# Patient Record
Sex: Male | Born: 1950 | Race: White | Hispanic: No | Marital: Married | Smoking: Former smoker
Health system: Southern US, Community
[De-identification: ages and names within clinical notes are randomized; demographics above are authoritative.]

## PROBLEM LIST (undated history)

## (undated) DIAGNOSIS — I739 Peripheral vascular disease, unspecified: Secondary | ICD-10-CM

## (undated) DIAGNOSIS — N289 Disorder of kidney and ureter, unspecified: Secondary | ICD-10-CM

## (undated) DIAGNOSIS — I429 Cardiomyopathy, unspecified: Secondary | ICD-10-CM

## (undated) DIAGNOSIS — G4730 Sleep apnea, unspecified: Secondary | ICD-10-CM

## (undated) DIAGNOSIS — R002 Palpitations: Secondary | ICD-10-CM

## (undated) DIAGNOSIS — M129 Arthropathy, unspecified: Secondary | ICD-10-CM

## (undated) DIAGNOSIS — E559 Vitamin D deficiency, unspecified: Secondary | ICD-10-CM

## (undated) DIAGNOSIS — R942 Abnormal results of pulmonary function studies: Secondary | ICD-10-CM

## (undated) DIAGNOSIS — E049 Nontoxic goiter, unspecified: Secondary | ICD-10-CM

## (undated) DIAGNOSIS — F32A Depression, unspecified: Secondary | ICD-10-CM

## (undated) DIAGNOSIS — M5136 Other intervertebral disc degeneration, lumbar region: Secondary | ICD-10-CM

## (undated) DIAGNOSIS — K219 Gastro-esophageal reflux disease without esophagitis: Secondary | ICD-10-CM

## (undated) DIAGNOSIS — G4733 Obstructive sleep apnea (adult) (pediatric): Secondary | ICD-10-CM

## (undated) DIAGNOSIS — F329 Major depressive disorder, single episode, unspecified: Secondary | ICD-10-CM

## (undated) DIAGNOSIS — IMO0001 Reserved for inherently not codable concepts without codable children: Secondary | ICD-10-CM

## (undated) DIAGNOSIS — J449 Chronic obstructive pulmonary disease, unspecified: Secondary | ICD-10-CM

## (undated) DIAGNOSIS — K5792 Diverticulitis of intestine, part unspecified, without perforation or abscess without bleeding: Secondary | ICD-10-CM

## (undated) DIAGNOSIS — Z973 Presence of spectacles and contact lenses: Secondary | ICD-10-CM

## (undated) DIAGNOSIS — E785 Hyperlipidemia, unspecified: Secondary | ICD-10-CM

## (undated) DIAGNOSIS — J189 Pneumonia, unspecified organism: Secondary | ICD-10-CM

## (undated) DIAGNOSIS — R9439 Abnormal result of other cardiovascular function study: Secondary | ICD-10-CM

## (undated) DIAGNOSIS — R519 Headache, unspecified: Secondary | ICD-10-CM

## (undated) DIAGNOSIS — Z9289 Personal history of other medical treatment: Secondary | ICD-10-CM

## (undated) DIAGNOSIS — Z8701 Personal history of pneumonia (recurrent): Secondary | ICD-10-CM

## (undated) DIAGNOSIS — Z87442 Personal history of urinary calculi: Secondary | ICD-10-CM

## (undated) DIAGNOSIS — Z8619 Personal history of other infectious and parasitic diseases: Secondary | ICD-10-CM

## (undated) DIAGNOSIS — R000 Tachycardia, unspecified: Secondary | ICD-10-CM

## (undated) DIAGNOSIS — M109 Gout, unspecified: Secondary | ICD-10-CM

## (undated) DIAGNOSIS — M8580 Other specified disorders of bone density and structure, unspecified site: Secondary | ICD-10-CM

## (undated) DIAGNOSIS — R51 Headache: Secondary | ICD-10-CM

## (undated) DIAGNOSIS — E079 Disorder of thyroid, unspecified: Secondary | ICD-10-CM

## (undated) DIAGNOSIS — R7303 Prediabetes: Secondary | ICD-10-CM

## (undated) DIAGNOSIS — M47816 Spondylosis without myelopathy or radiculopathy, lumbar region: Secondary | ICD-10-CM

## (undated) DIAGNOSIS — I10 Essential (primary) hypertension: Secondary | ICD-10-CM

## (undated) DIAGNOSIS — G4710 Hypersomnia, unspecified: Secondary | ICD-10-CM

## (undated) DIAGNOSIS — I6529 Occlusion and stenosis of unspecified carotid artery: Secondary | ICD-10-CM

## (undated) DIAGNOSIS — F419 Anxiety disorder, unspecified: Secondary | ICD-10-CM

## (undated) DIAGNOSIS — E039 Hypothyroidism, unspecified: Secondary | ICD-10-CM

## (undated) DIAGNOSIS — M51369 Other intervertebral disc degeneration, lumbar region without mention of lumbar back pain or lower extremity pain: Secondary | ICD-10-CM

## (undated) DIAGNOSIS — R918 Other nonspecific abnormal finding of lung field: Secondary | ICD-10-CM

## (undated) DIAGNOSIS — K439 Ventral hernia without obstruction or gangrene: Secondary | ICD-10-CM

## (undated) DIAGNOSIS — E46 Unspecified protein-calorie malnutrition: Secondary | ICD-10-CM

## (undated) DIAGNOSIS — N200 Calculus of kidney: Secondary | ICD-10-CM

## (undated) DIAGNOSIS — M1990 Unspecified osteoarthritis, unspecified site: Secondary | ICD-10-CM

## (undated) HISTORY — DX: Abnormal results of pulmonary function studies: R94.2

## (undated) HISTORY — PX: JOINT REPLACEMENT: SHX530

## (undated) HISTORY — DX: Vitamin D deficiency, unspecified: E55.9

## (undated) HISTORY — DX: Other specified disorders of bone density and structure, unspecified site: M85.80

## (undated) HISTORY — PX: COLONOSCOPY: SHX174

## (undated) HISTORY — DX: Ventral hernia without obstruction or gangrene: K43.9

## (undated) HISTORY — DX: Hyperlipidemia, unspecified: E78.5

## (undated) HISTORY — DX: Spondylosis without myelopathy or radiculopathy, lumbar region: M47.816

## (undated) HISTORY — DX: Nontoxic goiter, unspecified: E04.9

## (undated) HISTORY — DX: Calculus of kidney: N20.0

## (undated) HISTORY — DX: Obstructive sleep apnea (adult) (pediatric): G47.33

## (undated) HISTORY — DX: Other nonspecific abnormal finding of lung field: R91.8

## (undated) HISTORY — DX: Diverticulitis of intestine, part unspecified, without perforation or abscess without bleeding: K57.92

## (undated) HISTORY — DX: Prediabetes: R73.03

## (undated) HISTORY — DX: Hypersomnia, unspecified: G47.10

## (undated) HISTORY — DX: Other intervertebral disc degeneration, lumbar region: M51.36

## (undated) HISTORY — PX: FINGER AMPUTATION: SHX636

## (undated) HISTORY — DX: Gastro-esophageal reflux disease without esophagitis: K21.9

## (undated) HISTORY — DX: Occlusion and stenosis of unspecified carotid artery: I65.29

## (undated) HISTORY — DX: Personal history of other medical treatment: Z92.89

## (undated) HISTORY — DX: Arthropathy, unspecified: M12.9

## (undated) HISTORY — DX: Gout, unspecified: M10.9

## (undated) HISTORY — DX: Abnormal result of other cardiovascular function study: R94.39

## (undated) HISTORY — DX: Peripheral vascular disease, unspecified: I73.9

## (undated) HISTORY — DX: Cardiomyopathy, unspecified: I42.9

## (undated) HISTORY — DX: Chronic obstructive pulmonary disease, unspecified: J44.9

## (undated) HISTORY — DX: Palpitations: R00.2

## (undated) HISTORY — DX: Tachycardia, unspecified: R00.0

## (undated) HISTORY — DX: Other intervertebral disc degeneration, lumbar region without mention of lumbar back pain or lower extremity pain: M51.369

---

## 2014-10-18 DIAGNOSIS — Z8619 Personal history of other infectious and parasitic diseases: Secondary | ICD-10-CM

## 2014-10-18 HISTORY — DX: Personal history of other infectious and parasitic diseases: Z86.19

## 2014-10-27 ENCOUNTER — Encounter (HOSPITAL_BASED_OUTPATIENT_CLINIC_OR_DEPARTMENT_OTHER): Payer: Self-pay | Admitting: *Deleted

## 2014-10-27 ENCOUNTER — Inpatient Hospital Stay (HOSPITAL_BASED_OUTPATIENT_CLINIC_OR_DEPARTMENT_OTHER)
Admission: EM | Admit: 2014-10-27 | Discharge: 2014-11-21 | DRG: 003 | Disposition: A | Discharge status: Rehabilitation Facility | Payer: 59 | Attending: General Surgery | Admitting: General Surgery

## 2014-10-27 ENCOUNTER — Emergency Department (HOSPITAL_BASED_OUTPATIENT_CLINIC_OR_DEPARTMENT_OTHER): Payer: 59

## 2014-10-27 DIAGNOSIS — E871 Hypo-osmolality and hyponatremia: Secondary | ICD-10-CM | POA: Diagnosis present

## 2014-10-27 DIAGNOSIS — E46 Unspecified protein-calorie malnutrition: Secondary | ICD-10-CM | POA: Diagnosis present

## 2014-10-27 DIAGNOSIS — Z933 Colostomy status: Secondary | ICD-10-CM

## 2014-10-27 DIAGNOSIS — E039 Hypothyroidism, unspecified: Secondary | ICD-10-CM | POA: Diagnosis present

## 2014-10-27 DIAGNOSIS — E870 Hyperosmolality and hypernatremia: Secondary | ICD-10-CM | POA: Diagnosis present

## 2014-10-27 DIAGNOSIS — G4733 Obstructive sleep apnea (adult) (pediatric): Secondary | ICD-10-CM | POA: Diagnosis present

## 2014-10-27 DIAGNOSIS — E878 Other disorders of electrolyte and fluid balance, not elsewhere classified: Secondary | ICD-10-CM | POA: Diagnosis present

## 2014-10-27 DIAGNOSIS — K651 Peritoneal abscess: Secondary | ICD-10-CM | POA: Diagnosis present

## 2014-10-27 DIAGNOSIS — I9581 Postprocedural hypotension: Secondary | ICD-10-CM | POA: Diagnosis not present

## 2014-10-27 DIAGNOSIS — I214 Non-ST elevation (NSTEMI) myocardial infarction: Secondary | ICD-10-CM | POA: Diagnosis not present

## 2014-10-27 DIAGNOSIS — Z978 Presence of other specified devices: Secondary | ICD-10-CM

## 2014-10-27 DIAGNOSIS — Z9911 Dependence on respirator [ventilator] status: Secondary | ICD-10-CM

## 2014-10-27 DIAGNOSIS — K429 Umbilical hernia without obstruction or gangrene: Secondary | ICD-10-CM | POA: Diagnosis present

## 2014-10-27 DIAGNOSIS — E43 Unspecified severe protein-calorie malnutrition: Secondary | ICD-10-CM | POA: Diagnosis present

## 2014-10-27 DIAGNOSIS — Y95 Nosocomial condition: Secondary | ICD-10-CM | POA: Diagnosis present

## 2014-10-27 DIAGNOSIS — I10 Essential (primary) hypertension: Secondary | ICD-10-CM | POA: Diagnosis present

## 2014-10-27 DIAGNOSIS — J9621 Acute and chronic respiratory failure with hypoxia: Secondary | ICD-10-CM | POA: Diagnosis not present

## 2014-10-27 DIAGNOSIS — A419 Sepsis, unspecified organism: Secondary | ICD-10-CM | POA: Diagnosis present

## 2014-10-27 DIAGNOSIS — R451 Restlessness and agitation: Secondary | ICD-10-CM | POA: Diagnosis not present

## 2014-10-27 DIAGNOSIS — J9601 Acute respiratory failure with hypoxia: Secondary | ICD-10-CM | POA: Diagnosis present

## 2014-10-27 DIAGNOSIS — N200 Calculus of kidney: Secondary | ICD-10-CM | POA: Diagnosis present

## 2014-10-27 DIAGNOSIS — J320 Chronic maxillary sinusitis: Secondary | ICD-10-CM | POA: Diagnosis present

## 2014-10-27 DIAGNOSIS — K631 Perforation of intestine (nontraumatic): Secondary | ICD-10-CM

## 2014-10-27 DIAGNOSIS — R109 Unspecified abdominal pain: Secondary | ICD-10-CM | POA: Diagnosis present

## 2014-10-27 DIAGNOSIS — E860 Dehydration: Secondary | ICD-10-CM | POA: Diagnosis present

## 2014-10-27 DIAGNOSIS — L539 Erythematous condition, unspecified: Secondary | ICD-10-CM | POA: Diagnosis present

## 2014-10-27 DIAGNOSIS — J811 Chronic pulmonary edema: Secondary | ICD-10-CM | POA: Diagnosis present

## 2014-10-27 DIAGNOSIS — J9811 Atelectasis: Secondary | ICD-10-CM | POA: Diagnosis present

## 2014-10-27 DIAGNOSIS — E876 Hypokalemia: Secondary | ICD-10-CM | POA: Diagnosis not present

## 2014-10-27 DIAGNOSIS — E669 Obesity, unspecified: Secondary | ICD-10-CM | POA: Diagnosis present

## 2014-10-27 DIAGNOSIS — E079 Disorder of thyroid, unspecified: Secondary | ICD-10-CM | POA: Diagnosis present

## 2014-10-27 DIAGNOSIS — Z888 Allergy status to other drugs, medicaments and biological substances status: Secondary | ICD-10-CM

## 2014-10-27 DIAGNOSIS — J9600 Acute respiratory failure, unspecified whether with hypoxia or hypercapnia: Secondary | ICD-10-CM

## 2014-10-27 DIAGNOSIS — Z87891 Personal history of nicotine dependence: Secondary | ICD-10-CM

## 2014-10-27 DIAGNOSIS — E875 Hyperkalemia: Secondary | ICD-10-CM | POA: Diagnosis present

## 2014-10-27 DIAGNOSIS — J189 Pneumonia, unspecified organism: Secondary | ICD-10-CM | POA: Diagnosis present

## 2014-10-27 DIAGNOSIS — K567 Ileus, unspecified: Secondary | ICD-10-CM | POA: Diagnosis not present

## 2014-10-27 DIAGNOSIS — E872 Acidosis: Secondary | ICD-10-CM | POA: Diagnosis present

## 2014-10-27 DIAGNOSIS — T50905A Adverse effect of unspecified drugs, medicaments and biological substances, initial encounter: Secondary | ICD-10-CM | POA: Diagnosis not present

## 2014-10-27 DIAGNOSIS — K5720 Diverticulitis of large intestine with perforation and abscess without bleeding: Principal | ICD-10-CM | POA: Diagnosis present

## 2014-10-27 DIAGNOSIS — A498 Other bacterial infections of unspecified site: Secondary | ICD-10-CM | POA: Diagnosis not present

## 2014-10-27 DIAGNOSIS — Z430 Encounter for attention to tracheostomy: Secondary | ICD-10-CM

## 2014-10-27 DIAGNOSIS — R911 Solitary pulmonary nodule: Secondary | ICD-10-CM | POA: Diagnosis present

## 2014-10-27 DIAGNOSIS — B372 Candidiasis of skin and nail: Secondary | ICD-10-CM | POA: Diagnosis not present

## 2014-10-27 DIAGNOSIS — G8918 Other acute postprocedural pain: Secondary | ICD-10-CM | POA: Diagnosis present

## 2014-10-27 DIAGNOSIS — D649 Anemia, unspecified: Secondary | ICD-10-CM | POA: Diagnosis not present

## 2014-10-27 DIAGNOSIS — Z79899 Other long term (current) drug therapy: Secondary | ICD-10-CM | POA: Diagnosis not present

## 2014-10-27 DIAGNOSIS — R4182 Altered mental status, unspecified: Secondary | ICD-10-CM

## 2014-10-27 DIAGNOSIS — Z930 Tracheostomy status: Secondary | ICD-10-CM

## 2014-10-27 DIAGNOSIS — G7281 Critical illness myopathy: Secondary | ICD-10-CM | POA: Diagnosis present

## 2014-10-27 DIAGNOSIS — E8770 Fluid overload, unspecified: Secondary | ICD-10-CM | POA: Diagnosis present

## 2014-10-27 DIAGNOSIS — R739 Hyperglycemia, unspecified: Secondary | ICD-10-CM | POA: Diagnosis present

## 2014-10-27 DIAGNOSIS — L270 Generalized skin eruption due to drugs and medicaments taken internally: Secondary | ICD-10-CM | POA: Diagnosis not present

## 2014-10-27 DIAGNOSIS — Z87442 Personal history of urinary calculi: Secondary | ICD-10-CM | POA: Diagnosis not present

## 2014-10-27 DIAGNOSIS — Z6838 Body mass index (BMI) 38.0-38.9, adult: Secondary | ICD-10-CM | POA: Diagnosis not present

## 2014-10-27 DIAGNOSIS — R6521 Severe sepsis with septic shock: Secondary | ICD-10-CM | POA: Diagnosis present

## 2014-10-27 DIAGNOSIS — J9690 Respiratory failure, unspecified, unspecified whether with hypoxia or hypercapnia: Secondary | ICD-10-CM

## 2014-10-27 DIAGNOSIS — R509 Fever, unspecified: Secondary | ICD-10-CM | POA: Diagnosis present

## 2014-10-27 DIAGNOSIS — R21 Rash and other nonspecific skin eruption: Secondary | ICD-10-CM | POA: Diagnosis not present

## 2014-10-27 DIAGNOSIS — T884XXA Failed or difficult intubation, initial encounter: Secondary | ICD-10-CM

## 2014-10-27 DIAGNOSIS — B966 Bacteroides fragilis [B. fragilis] as the cause of diseases classified elsewhere: Secondary | ICD-10-CM | POA: Diagnosis not present

## 2014-10-27 DIAGNOSIS — R579 Shock, unspecified: Secondary | ICD-10-CM | POA: Diagnosis present

## 2014-10-27 DIAGNOSIS — J9620 Acute and chronic respiratory failure, unspecified whether with hypoxia or hypercapnia: Secondary | ICD-10-CM | POA: Diagnosis not present

## 2014-10-27 HISTORY — DX: Disorder of kidney and ureter, unspecified: N28.9

## 2014-10-27 HISTORY — DX: Disorder of thyroid, unspecified: E07.9

## 2014-10-27 HISTORY — DX: Essential (primary) hypertension: I10

## 2014-10-27 HISTORY — DX: Hypothyroidism, unspecified: E03.9

## 2014-10-27 HISTORY — DX: Unspecified protein-calorie malnutrition: E46

## 2014-10-27 LAB — URINALYSIS, ROUTINE W REFLEX MICROSCOPIC
BILIRUBIN URINE: NEGATIVE
Glucose, UA: NEGATIVE mg/dL
Hgb urine dipstick: NEGATIVE
KETONES UR: NEGATIVE mg/dL
Leukocytes, UA: NEGATIVE
NITRITE: NEGATIVE
Protein, ur: NEGATIVE mg/dL
Specific Gravity, Urine: 1.044 — ABNORMAL HIGH (ref 1.005–1.030)
Urobilinogen, UA: 0.2 mg/dL (ref 0.0–1.0)
pH: 5.5 (ref 5.0–8.0)

## 2014-10-27 LAB — COMPREHENSIVE METABOLIC PANEL
ALK PHOS: 53 U/L (ref 39–117)
ALT: 24 U/L (ref 0–53)
AST: 29 U/L (ref 0–37)
Albumin: 4.6 g/dL (ref 3.5–5.2)
Anion gap: 11 (ref 5–15)
BUN: 23 mg/dL (ref 6–23)
CHLORIDE: 99 meq/L (ref 96–112)
CO2: 24 mmol/L (ref 19–32)
Calcium: 9.4 mg/dL (ref 8.4–10.5)
Creatinine, Ser: 1.17 mg/dL (ref 0.50–1.35)
GFR, EST AFRICAN AMERICAN: 76 mL/min — AB (ref >90)
GFR, EST NON AFRICAN AMERICAN: 66 mL/min — AB (ref >90)
GLUCOSE: 170 mg/dL — AB (ref 70–99)
POTASSIUM: 4 mmol/L (ref 3.5–5.1)
SODIUM: 134 mmol/L — AB (ref 135–145)
TOTAL PROTEIN: 6.8 g/dL (ref 6.0–8.3)
Total Bilirubin: 0.7 mg/dL (ref 0.3–1.2)

## 2014-10-27 LAB — CBC WITH DIFFERENTIAL/PLATELET
BASOS ABS: 0 10*3/uL (ref 0.0–0.1)
Basophils Relative: 0 % (ref 0–1)
EOS ABS: 0 10*3/uL (ref 0.0–0.7)
Eosinophils Relative: 0 % (ref 0–5)
HCT: 50.9 % (ref 39.0–52.0)
HEMOGLOBIN: 16.9 g/dL (ref 13.0–17.0)
LYMPHS ABS: 0.6 10*3/uL — AB (ref 0.7–4.0)
Lymphocytes Relative: 4 % — ABNORMAL LOW (ref 12–46)
MCH: 32.1 pg (ref 26.0–34.0)
MCHC: 33.2 g/dL (ref 30.0–36.0)
MCV: 96.8 fL (ref 78.0–100.0)
MONO ABS: 1.1 10*3/uL — AB (ref 0.1–1.0)
MONOS PCT: 7 % (ref 3–12)
NEUTROS ABS: 13.8 10*3/uL — AB (ref 1.7–7.7)
NEUTROS PCT: 89 % — AB (ref 43–77)
Platelets: 187 10*3/uL (ref 150–400)
RBC: 5.26 MIL/uL (ref 4.22–5.81)
RDW: 14.5 % (ref 11.5–15.5)
WBC: 15.5 10*3/uL — ABNORMAL HIGH (ref 4.0–10.5)

## 2014-10-27 LAB — I-STAT CG4 LACTIC ACID, ED
LACTIC ACID, VENOUS: 3.21 mmol/L — AB (ref 0.5–2.2)
LACTIC ACID, VENOUS: 4.76 mmol/L — AB (ref 0.5–2.2)

## 2014-10-27 LAB — LIPASE, BLOOD: Lipase: 23 U/L (ref 11–59)

## 2014-10-27 MED ORDER — SODIUM CHLORIDE 0.9 % IV BOLUS (SEPSIS)
1000.0000 mL | Freq: Once | INTRAVENOUS | Status: AC
  Administered 2014-10-27: 1000 mL via INTRAVENOUS

## 2014-10-27 MED ORDER — ONDANSETRON HCL 4 MG/2ML IJ SOLN
INTRAMUSCULAR | Status: AC
  Filled 2014-10-27: qty 2

## 2014-10-27 MED ORDER — PROPOFOL 10 MG/ML IV BOLUS
INTRAVENOUS | Status: AC
  Filled 2014-10-27: qty 20

## 2014-10-27 MED ORDER — LEVOTHYROXINE SODIUM 100 MCG IV SOLR
50.0000 ug | Freq: Every day | INTRAVENOUS | Status: DC
  Administered 2014-10-28: 50 ug via INTRAVENOUS
  Filled 2014-10-27 (×2): qty 5

## 2014-10-27 MED ORDER — PIPERACILLIN-TAZOBACTAM 3.375 G IVPB 30 MIN
3.3750 g | Freq: Once | INTRAVENOUS | Status: AC
  Administered 2014-10-27: 3.375 g via INTRAVENOUS
  Filled 2014-10-27 (×2): qty 50

## 2014-10-27 MED ORDER — IOHEXOL 300 MG/ML  SOLN
100.0000 mL | Freq: Once | INTRAMUSCULAR | Status: AC | PRN
  Administered 2014-10-27: 100 mL via INTRAVENOUS

## 2014-10-27 MED ORDER — IOHEXOL 300 MG/ML  SOLN
50.0000 mL | Freq: Once | INTRAMUSCULAR | Status: AC | PRN
  Administered 2014-10-27: 50 mL via ORAL

## 2014-10-27 MED ORDER — HYDROMORPHONE HCL 1 MG/ML IJ SOLN
1.0000 mg | Freq: Once | INTRAMUSCULAR | Status: AC
  Administered 2014-10-27: 1 mg via INTRAVENOUS
  Filled 2014-10-27: qty 1

## 2014-10-27 MED ORDER — ONDANSETRON HCL 4 MG/2ML IJ SOLN
4.0000 mg | Freq: Once | INTRAMUSCULAR | Status: AC
  Administered 2014-10-27: 4 mg via INTRAVENOUS
  Filled 2014-10-27: qty 2

## 2014-10-27 MED ORDER — SODIUM CHLORIDE 0.9 % IV SOLN
Freq: Once | INTRAVENOUS | Status: AC
  Administered 2014-10-27: 20:00:00 via INTRAVENOUS

## 2014-10-27 MED ORDER — LIDOCAINE HCL (CARDIAC) 20 MG/ML IV SOLN
INTRAVENOUS | Status: AC
  Filled 2014-10-27: qty 5

## 2014-10-27 MED ORDER — CISATRACURIUM BESYLATE 20 MG/10ML IV SOLN
INTRAVENOUS | Status: AC
  Filled 2014-10-27: qty 10

## 2014-10-27 NOTE — ED Notes (Signed)
abd pain started earlier today, [facility] of [facility]d

## 2014-10-27 NOTE — ED Provider Notes (Signed)
CSN: [id number]     Arrival date & time 10/27/14  1603 History  This chart was scribed for William RollerBrian D Hudsen Fei, MD by Evon Slackerrance Branch, ED Scribe. This patient was seen in room MH02/MH02 and the patient's care was started at 6:08 PM.      Chief Complaint  Patient presents with  . Abdominal Pain   Patient is a 64 y.o. male presenting with abdominal pain. The history is provided by the patient. No language interpreter was used.  Abdominal Pain  HPI Comments: William OuJoseph Mesa is a 64 y.o. male with PMHx  who presents to the Emergency Department complaining of sudden constant left sided abdominal pain that had been persistent throughout the day. Pt states that he feels as the pain is worse with movement or coughing. Pt states that he has umbilical hernia present. Denies abdomen surgeries. Pt states he has had colonoscopy several years ago that showed now abnormalities. Denies new back pain.    -The symptoms are severe, persistent, worse with coughing, not associated with fevers.  Past Medical History  Diagnosis Date  . Renal disorder     kidney stones  . Hypertension   . Thyroid disease    History reviewed. No pertinent past surgical history. No family history on file. History  Substance Use Topics  . Smoking status: Former Games developermoker  . Smokeless tobacco: Not on file  . Alcohol Use: Not on file    Review of Systems  Gastrointestinal: Positive for abdominal pain.  All other systems reviewed and are negative.     Allergies  Aloprim and Simvastatin  Home Medications   Prior to Admission medications   Medication Sig Start Date End Date Taking? Authorizing Provider  levothyroxine (SYNTHROID, LEVOTHROID) 100 MCG tablet Take 100 mcg by mouth daily before breakfast.   Yes Historical Provider, MD  lisinopril (PRINIVIL,ZESTRIL) 10 MG tablet Take 10 mg by mouth daily.   Yes Historical Provider, MD  meloxicam (MOBIC) 15 MG tablet Take 15 mg by mouth daily.   Yes Historical Provider, MD  Vitamin  D, Ergocalciferol, (DRISDOL) 50000 UNITS CAPS capsule Take 50,000 Units by mouth every 7 (seven) days.   Yes Historical Provider, MD   Triage Vitals: BP 105/52 mmHg  Pulse 103  Temp(Src) 99.7 F (37.6 C) (Oral)  Resp 20  SpO2 95%  Physical Exam  Constitutional: He appears well-developed and well-nourished. No distress.  HENT:  Head: Normocephalic and atraumatic.  Mouth/Throat: Oropharynx is clear and moist. No oropharyngeal exudate.  Eyes: Conjunctivae and EOM are normal. Pupils are equal, round, and reactive to light. Right eye exhibits no discharge. Left eye exhibits no discharge. No scleral icterus.  Neck: Normal range of motion. Neck supple. No JVD present. No thyromegaly present.  Cardiovascular: Normal rate, regular rhythm, normal heart sounds and intact distal pulses.  Exam reveals no gallop and no friction rub.   No murmur heard. Pulmonary/Chest: Effort normal and breath sounds normal. No respiratory distress. He has no wheezes. He has no rales.  Abdominal: Soft. Bowel sounds are normal. He exhibits no distension and no mass. There is tenderness.  Diffuse abdominal tenderness with peritoneal signs and cough pain.   Musculoskeletal: Normal range of motion. He exhibits no edema or tenderness.  Lymphadenopathy:    He has no cervical adenopathy.  Neurological: He is alert. Coordination normal.  Skin: Skin is warm and dry. No rash noted. No erythema.  Psychiatric: He has a normal mood and affect. His behavior is normal.  Nursing note and vitals  reviewed.   ED Course  Procedures (including critical care time) DIAGNOSTIC STUDIES: Oxygen Saturation is 95% on RA, adequate by my interpretation.    COORDINATION OF CARE: 6:15 PM-Discussed treatment plan with pt at bedside and pt agreed to plan.     Labs Review Labs Reviewed  CBC WITH DIFFERENTIAL - Abnormal; Notable for the following:    WBC 15.5 (*)    Neutrophils Relative % 89 (*)    Neutro Abs 13.8 (*)    Lymphocytes  Relative 4 (*)    Lymphs Abs 0.6 (*)    Monocytes Absolute 1.1 (*)    All other components within normal limits  COMPREHENSIVE METABOLIC PANEL - Abnormal; Notable for the following:    Sodium 134 (*)    Glucose, Bld 170 (*)    GFR calc non Af Amer 66 (*)    GFR calc Af Amer 76 (*)    All other components within normal limits  I-STAT CG4 LACTIC ACID, ED - Abnormal; Notable for the following:    Lactic Acid, Venous 4.76 (*)    All other components within normal limits  I-STAT CG4 LACTIC ACID, ED - Abnormal; Notable for the following:    Lactic Acid, Venous 3.21 (*)    All other components within normal limits  LIPASE, BLOOD  URINALYSIS, ROUTINE W REFLEX MICROSCOPIC    Imaging Review Ct Abdomen Pelvis W Contrast  10/27/2014   CLINICAL DATA:  Initial evaluation sudden onset left abdominal pain today, personal history of umbilical hernia kidney stones hypertension, elevated white blood count  EXAM: CT ABDOMEN AND PELVIS WITH CONTRAST  TECHNIQUE: Multidetector CT imaging of the abdomen and pelvis was performed using the standard protocol following bolus administration of intravenous contrast.  CONTRAST:  50mL OMNIPAQUE IOHEXOL 300 MG/ML SOLN, OMNIPAQUE IOHEXOL 300 MG/ML SOLN  COMPARISON:  None.  FINDINGS: Liver and gallbladder are normal. Spleen is normal. Pancreas is normal. Adrenal glands are normal.  Numerous small bilateral nonobstructing renal calculi all measuring less than or equal to about 6 mm. Mild bilateral perinephric inflammatory changes likely chronic. No hydronephrosis. Bladder is normal.  Mild calcification of the abdominal aorta. No significant retroperitoneal adenopathy. Reproductive organs are normal. Fat containing inguinal hernias bilaterally.  Periumbilical hernia identified containing a loop of small bowel. Hernia neck is narrow at 2.5 cm and the hernia measures 2.5 x 5.9 cm. No evidence of obstruction or strangulation however.  Relatively mobile large bowel with cecum in  the right mid abdomen to upper quadrant. Air-fluid levels proximal large bowel. Diverticulosis throughout the descending and sigmoid colon. Mild inflammatory change posterior to the proximal sigmoid colon. Additionally, there is an extraluminal collection of gas and debris measuring 61 x 66 mm immediately posterior and caudal to the proximal sigmoid colon.  No free fluid.  No free air.  No acute musculoskeletal findings.  A mm right lower lobe pulmonary nodule. Atelectasis right lung base. Atelectasis left lung base. Mild cardiac enlargement.  IMPRESSION: 1. Diverticulitis proximal sigmoid colon. There is evidence of a focal contained perforation measuring 6.6 x 6.1 cm. 2. Narrow neck umbilical hernia contains loop of small bowel currently with no evidence of obstruction or strangulation. 3. Nonobstructing bilateral renal calculi 4. Bilateral lower lobe atelectasis. 8 mm pulmonary nodule right lower lobe. If the patient is at high risk for bronchogenic carcinoma, follow-up chest CT at 3-40months is recommended. If the patient is at low risk for bronchogenic carcinoma, follow-up chest CT at 6-12 months is recommended. This recommendation follows  the consensus statement: Guidelines for Management of Small Pulmonary Nodules Detected on CT Scans: A Statement from the [employer] as published in Radiology 2005; 237:395-400.   Electronically Signed   By: Esperanza Heir M.D.   On: 10/27/2014 20:18   Dg Chest Port 1 View  10/27/2014   CLINICAL DATA:  Mid abdominal pain.  EXAM: PORTABLE CHEST - 1 VIEW  COMPARISON:  None.  FINDINGS: Mild enlargement of the cardiopericardial silhouette with cephalization of blood flow. No definite Kerley B-lines. Indistinct airspace opacity along the left hemidiaphragm. Mild blunting of the left lateral costophrenic angle.  IMPRESSION: 1. Mild enlargement of the cardiopericardial silhouette with cephalization of blood flow favoring pulmonary venous hypertension. 2. Suspected trace  left pleural effusion. Suspected atelectasis at the left lung base.   Electronically Signed   By: Herbie Baltimore M.D.   On: 10/27/2014 19:23      MDM   Final diagnoses:  Abdominal pain  Perforated bowel   The patient appears critically ill, he appears to be septic with a tachycardia, leukocytosis and a lactic acid over 4, a bedside x-ray shows no signs of perforation with free air however a CT scan does reveal that he has a focal contained perforation of over 6 cm of his left proximal sigmoid colon. This is very concerning that the patient may have become septic from this perforation, I have started broad-spectrum antibiotics, IV fluids, discussed his care with the general surgeon, Dr. Carolynne Edouard who has accepted the patient's care at [facility] emergency department where he will evaluate him. I've also discussed the care with Dr. Fayrene Fearing in the emergency department at that hospital who is aware of the patient and will coordinate emergency department evaluation on arrival.  CRITICAL CARE Performed by: William [name] Total critical care time: 35 Critical care time was exclusive of separately billable procedures and treating other patients. Critical care was necessary to treat or prevent imminent or life-threatening deterioration. Critical care was time spent personally by me on the following activities: development of treatment plan with patient and/or surrogate as well as nursing, discussions with consultants, evaluation of patient's response to treatment, examination of patient, obtaining history from patient or surrogate, ordering and performing treatments and interventions, ordering and review of laboratory studies, ordering and review of radiographic studies, pulse oximetry and re-evaluation of patient's condition.  Meds given in ED:  Medications  piperacillin-tazobactam (ZOSYN) IVPB 3.375 g (3.375 g Intravenous New Bag/Given 10/27/14 2035)  HYDROmorphone (DILAUDID) injection 1 mg (1 mg  Intravenous Given 10/27/14 1847)  ondansetron (ZOFRAN) injection 4 mg (4 mg Intravenous Given 10/27/14 1846)  0.9 %  sodium chloride infusion ( Intravenous New Bag/Given 10/27/14 1939)  iohexol (OMNIPAQUE) 300 MG/ML solution 50 mL (50 mLs Oral Contrast Given 10/27/14 1908)  iohexol (OMNIPAQUE) 300 MG/ML solution 100 mL (100 mLs Intravenous Contrast Given 10/27/14 1951)  sodium chloride 0.9 % bolus 1,000 mL (1,000 mLs Intravenous New Bag/Given 10/27/14 2037)    New Prescriptions   No medications on file     I personally performed the services described in this documentation, which was scribed in my presence. The recorded information has been reviewed and is accurate.      William Roller, MD 10/27/14 [phone]

## 2014-10-27 NOTE — ED Notes (Signed)
Dr Carolynne Edouardoth informed of pt's vital signs, pt receiving bolus of NS per verbal order

## 2014-10-27 NOTE — H&P (Signed)
William [name] is an 64 y.o. male.   Chief Complaint: abdominal pain HPI: The pt is a 64yo wm who presents with abdominal pain that started this am. He states it felt like his typical kidney stone pain. Pain worsened during the day. He has run a fever. CT shows sigmoid diverticulitis with well formed abscess anterior to sigmoid colon but no free air.  Past Medical History  Diagnosis Date  . Renal disorder     kidney stones  . Hypertension   . Thyroid disease     History reviewed. No pertinent past surgical history.  No family history on file. Social History:  reports that he has quit smoking. He does not have any smokeless tobacco history on file. His alcohol and drug histories are not on file.  Allergies:  Allergies  Allergen Reactions  . Aloprim [Allopurinol]   . Simvastatin      (Not in a hospital admission)  Results for orders placed or performed during the hospital encounter of 10/27/14 (from the past 48 hour(s))  CBC with Differential     Status: Abnormal   Collection Time: 10/27/14  6:10 PM  Result Value Ref Range   WBC 15.5 (H) 4.0 - 10.5 K/uL   RBC 5.26 4.22 - 5.81 MIL/uL   Hemoglobin 16.9 13.0 - 17.0 g/dL   HCT 50.9 39.0 - 52.0 %   MCV 96.8 78.0 - 100.0 fL   MCH 32.1 26.0 - 34.0 pg   MCHC 33.2 30.0 - 36.0 g/dL   RDW 14.5 11.5 - 15.5 %   Platelets 187 150 - 400 K/uL   Neutrophils Relative % 89 (H) 43 - 77 %   Neutro Abs 13.8 (H) 1.7 - 7.7 K/uL   Lymphocytes Relative 4 (L) 12 - 46 %   Lymphs Abs 0.6 (L) 0.7 - 4.0 K/uL   Monocytes Relative 7 3 - 12 %   Monocytes Absolute 1.1 (H) 0.1 - 1.0 K/uL   Eosinophils Relative 0 0 - 5 %   Eosinophils Absolute 0.0 0.0 - 0.7 K/uL   Basophils Relative 0 0 - 1 %   Basophils Absolute 0.0 0.0 - 0.1 K/uL  Comprehensive metabolic panel     Status: Abnormal   Collection Time: 10/27/14  6:10 PM  Result Value Ref Range   Sodium 134 (L) 135 - 145 mmol/L    Comment: Please note change in reference range.   Potassium 4.0 3.5 -  5.1 mmol/L    Comment: Please note change in reference range.   Chloride 99 96 - 112 mEq/L   CO2 24 19 - 32 mmol/L   Glucose, Bld 170 (H) 70 - 99 mg/dL   BUN 23 6 - 23 mg/dL   Creatinine, Ser 1.17 0.50 - 1.35 mg/dL   Calcium 9.4 8.4 - 10.5 mg/dL   Total Protein 6.8 6.0 - 8.3 g/dL   Albumin 4.6 3.5 - 5.2 g/dL   AST 29 0 - 37 U/L   ALT 24 0 - 53 U/L   Alkaline Phosphatase 53 39 - 117 U/L   Total Bilirubin 0.7 0.3 - 1.2 mg/dL   GFR calc non Af Amer 66 (L) >90 mL/min   GFR calc Af Amer 76 (L) >90 mL/min    Comment: (NOTE) The eGFR has been calculated using the CKD EPI equation. This calculation has not been validated in all clinical situations. eGFR's persistently <90 mL/min signify possible Chronic Kidney Disease.    Anion gap 11 5 - 15  Lipase, blood  Status: None   Collection Time: 10/27/14  6:10 PM  Result Value Ref Range   Lipase 23 11 - 59 U/L  I-Stat CG4 Lactic Acid, ED     Status: Abnormal   Collection Time: 10/27/14  6:30 PM  Result Value Ref Range   Lactic Acid, Venous 4.76 (H) 0.5 - 2.2 mmol/L  I-Stat CG4 Lactic Acid, ED     Status: Abnormal   Collection Time: 10/27/14  7:24 PM  Result Value Ref Range   Lactic Acid, Venous 3.21 (H) 0.5 - 2.2 mmol/L  Urinalysis, Routine w reflex microscopic     Status: Abnormal   Collection Time: 10/27/14  8:46 PM  Result Value Ref Range   Color, Urine YELLOW YELLOW   APPearance CLEAR CLEAR   Specific Gravity, Urine 1.044 (H) 1.005 - 1.030   pH 5.5 5.0 - 8.0   Glucose, UA NEGATIVE NEGATIVE mg/dL   Hgb urine dipstick NEGATIVE NEGATIVE   Bilirubin Urine NEGATIVE NEGATIVE   Ketones, ur NEGATIVE NEGATIVE mg/dL   Protein, ur NEGATIVE NEGATIVE mg/dL   Urobilinogen, UA 0.2 0.0 - 1.0 mg/dL   Nitrite NEGATIVE NEGATIVE   Leukocytes, UA NEGATIVE NEGATIVE    Comment: MICROSCOPIC NOT DONE ON URINES WITH NEGATIVE PROTEIN, BLOOD, LEUKOCYTES, NITRITE, OR GLUCOSE <1000 mg/dL.   Ct Abdomen Pelvis W Contrast  10/27/2014   CLINICAL DATA:   Initial evaluation sudden onset left abdominal pain today, personal history of umbilical hernia kidney stones hypertension, elevated white blood count  EXAM: CT ABDOMEN AND PELVIS WITH CONTRAST  TECHNIQUE: Multidetector CT imaging of the abdomen and pelvis was performed using the standard protocol following bolus administration of intravenous contrast.  CONTRAST:  39m OMNIPAQUE IOHEXOL 300 MG/ML SOLN, 1081mOMNIPAQUE IOHEXOL 300 MG/ML SOLN  COMPARISON:  None.  FINDINGS: Liver and gallbladder are normal. Spleen is normal. Pancreas is normal. Adrenal glands are normal.  Numerous small bilateral nonobstructing renal calculi all measuring less than or equal to about 6 mm. Mild bilateral perinephric inflammatory changes likely chronic. No hydronephrosis. Bladder is normal.  Mild calcification of the abdominal aorta. No significant retroperitoneal adenopathy. Reproductive organs are normal. Fat containing inguinal hernias bilaterally.  Periumbilical hernia identified containing a loop of small bowel. Hernia neck is narrow at 2.5 cm and the hernia measures 2.5 x 5.9 cm. No evidence of obstruction or strangulation however.  Relatively mobile large bowel with cecum in the right mid abdomen to upper quadrant. Air-fluid levels proximal large bowel. Diverticulosis throughout the descending and sigmoid colon. Mild inflammatory change posterior to the proximal sigmoid colon. Additionally, there is an extraluminal collection of gas and debris measuring 61 x 66 mm immediately posterior and caudal to the proximal sigmoid colon.  No free fluid.  No free air.  No acute musculoskeletal findings.  A mm right lower lobe pulmonary nodule. Atelectasis right lung base. Atelectasis left lung base. Mild cardiac enlargement.  IMPRESSION: 1. Diverticulitis proximal sigmoid colon. There is evidence of a focal contained perforation measuring 6.6 x 6.1 cm. 2. Narrow neck umbilical hernia contains loop of small bowel currently with no evidence of  obstruction or strangulation. 3. Nonobstructing bilateral renal calculi 4. Bilateral lower lobe atelectasis. 8 mm pulmonary nodule right lower lobe. If the patient is at high risk for bronchogenic carcinoma, follow-up chest CT at 3-12m34months recommended. If the patient is at low risk for bronchogenic carcinoma, follow-up chest CT at 6-12 months is recommended. This recommendation follows the consensus statement: Guidelines for Management of Small Pulmonary Nodules Detected  on CT Scans: A Statement from the [employer] as published in Radiology 2005; 174:081-448.   Electronically Signed   By: Skipper Cliche M.D.   On: 10/27/2014 20:18   Dg Chest Port 1 View  10/27/2014   CLINICAL DATA:  Mid abdominal pain.  EXAM: PORTABLE CHEST - 1 VIEW  COMPARISON:  None.  FINDINGS: Mild enlargement of the cardiopericardial silhouette with cephalization of blood flow. No definite Kerley B-lines. Indistinct airspace opacity along the left hemidiaphragm. Mild blunting of the left lateral costophrenic angle.  IMPRESSION: 1. Mild enlargement of the cardiopericardial silhouette with cephalization of blood flow favoring pulmonary venous hypertension. 2. Suspected trace left pleural effusion. Suspected atelectasis at the left lung base.   Electronically Signed   By: Sherryl Barters M.D.   On: 10/27/2014 19:23    Review of Systems  Constitutional: Positive for fever.  HENT: Negative.   Eyes: Negative.   Respiratory: Negative.   Cardiovascular: Negative.   Gastrointestinal: Positive for nausea and abdominal pain.  Genitourinary: Negative.   Musculoskeletal: Negative.   Skin: Negative.   Neurological: Negative.   Endo/Heme/Allergies: Negative.   Psychiatric/Behavioral: Negative.     Blood pressure 108/50, pulse 121, temperature 100.9 F (38.3 C), temperature source Oral, resp. rate 20, weight 280 lb (127.007 kg), SpO2 93 %. Physical Exam  Constitutional: He is oriented to person, place, and time.  Obese wm  in no acute distress  HENT:  Head: Normocephalic and atraumatic.  Eyes: Conjunctivae and EOM are normal. Pupils are equal, round, and reactive to light.  Neck: Normal range of motion. Neck supple.  Cardiovascular: Normal rate, regular rhythm and normal heart sounds.   Slightly tachycardic  Respiratory: Effort normal and breath sounds normal.  GI:  Diffuse tenderness worse in LLQ. Right side seems soft without guarding. There is guarding on left. Easily reducible moderate sized umbilical hernia  Musculoskeletal: Normal range of motion.  History of amputation left index finger  Neurological: He is alert and oriented to person, place, and time.  Skin: Skin is warm and dry.  Psychiatric: He has a normal mood and affect. His behavior is normal.     Assessment/Plan The pt has sigmoid diverticulitis with what appears to be a contained perforation. Will plan to admit for bowel rest and IV abx. Will consult IR for perc drain of abscess. If his condition worsens then he may require more urgent exploration which would necessitate a colostomy as well. I have discussed this plan with him and his wife and he agrees  Jovita Kussmaul S 10/27/2014, 11:20 PM

## 2014-10-27 NOTE — ED Notes (Signed)
Pt placed on cardiac monitor 

## 2014-10-27 NOTE — ED Provider Notes (Signed)
10:52 PM [facility]. He is to be seen by Dr. Carolynne Edouardoth. The patient states his pain is well controlled presently.  The patient is awake and alert no acute distress. Abdomen is somewhat tense, distended and diffusely tender. There is an umbilical hernia present which is not significantly tender.  Dr. Pincus Sanesoth Zartman told that the patient has arrived.  William SeamenJohn L Tzippy Testerman, MD 10/27/14 2253

## 2014-10-27 NOTE — ED Notes (Signed)
Lactic Acid results reported to Dr.Miller 

## 2014-10-27 NOTE — ED Notes (Signed)
Per [facility]: Pt given 1 mg Dilaudid en route and normal saline IV

## 2014-10-28 ENCOUNTER — Inpatient Hospital Stay (HOSPITAL_COMMUNITY): Payer: 59

## 2014-10-28 ENCOUNTER — Inpatient Hospital Stay (HOSPITAL_COMMUNITY): Payer: 59 | Admitting: Anesthesiology

## 2014-10-28 ENCOUNTER — Encounter (HOSPITAL_COMMUNITY): Admission: EM | Disposition: A | Discharge status: Rehabilitation Facility | Payer: Self-pay | Source: Home / Self Care

## 2014-10-28 ENCOUNTER — Encounter (HOSPITAL_COMMUNITY): Payer: Self-pay | Admitting: Anesthesiology

## 2014-10-28 DIAGNOSIS — G8918 Other acute postprocedural pain: Secondary | ICD-10-CM

## 2014-10-28 DIAGNOSIS — J9601 Acute respiratory failure with hypoxia: Secondary | ICD-10-CM

## 2014-10-28 DIAGNOSIS — R579 Shock, unspecified: Secondary | ICD-10-CM

## 2014-10-28 DIAGNOSIS — G4730 Sleep apnea, unspecified: Secondary | ICD-10-CM

## 2014-10-28 DIAGNOSIS — I369 Nonrheumatic tricuspid valve disorder, unspecified: Secondary | ICD-10-CM

## 2014-10-28 DIAGNOSIS — G4733 Obstructive sleep apnea (adult) (pediatric): Secondary | ICD-10-CM | POA: Diagnosis present

## 2014-10-28 DIAGNOSIS — R911 Solitary pulmonary nodule: Secondary | ICD-10-CM | POA: Diagnosis present

## 2014-10-28 HISTORY — PX: PARTIAL COLECTOMY: SHX5273

## 2014-10-28 HISTORY — PX: COLOSTOMY: SHX63

## 2014-10-28 HISTORY — PX: LAPAROTOMY: SHX154

## 2014-10-28 LAB — CBC
HCT: 42.5 % (ref 39.0–52.0)
HCT: 40.8 % (ref 39.0–52.0)
HEMATOCRIT: 42.4 % (ref 39.0–52.0)
HEMATOCRIT: 43.3 % (ref 39.0–52.0)
HEMOGLOBIN: 13.8 g/dL (ref 13.0–17.0)
HEMOGLOBIN: 14.1 g/dL (ref 13.0–17.0)
HEMOGLOBIN: 13.3 g/dL (ref 13.0–17.0)
Hemoglobin: 13.8 g/dL (ref 13.0–17.0)
MCH: 31.9 pg (ref 26.0–34.0)
MCH: 32.1 pg (ref 26.0–34.0)
MCH: 32.2 pg (ref 26.0–34.0)
MCH: 32.5 pg (ref 26.0–34.0)
MCHC: 32.5 g/dL (ref 30.0–36.0)
MCHC: 32.6 g/dL (ref 30.0–36.0)
MCHC: 32.5 g/dL (ref 30.0–36.0)
MCHC: 32.6 g/dL (ref 30.0–36.0)
MCV: 97.9 fL (ref 78.0–100.0)
MCV: 98.6 fL (ref 78.0–100.0)
MCV: 99.3 fL (ref 78.0–100.0)
MCV: 99.8 fL (ref 78.0–100.0)
PLATELETS: 192 10*3/uL (ref 150–400)
Platelets: 176 10*3/uL (ref 150–400)
Platelets: 199 10*3/uL (ref 150–400)
Platelets: 186 10*3/uL (ref 150–400)
RBC: 4.33 MIL/uL (ref 4.22–5.81)
RBC: 4.39 MIL/uL (ref 4.22–5.81)
RBC: 4.28 MIL/uL (ref 4.22–5.81)
RBC: 4.09 MIL/uL — ABNORMAL LOW (ref 4.22–5.81)
RDW: 14.5 % (ref 11.5–15.5)
RDW: 14.6 % (ref 11.5–15.5)
RDW: 14.9 % (ref 11.5–15.5)
RDW: 15.1 % (ref 11.5–15.5)
WBC: 14 10*3/uL — ABNORMAL HIGH (ref 4.0–10.5)
WBC: 16.5 10*3/uL — ABNORMAL HIGH (ref 4.0–10.5)
WBC: 16.3 10*3/uL — ABNORMAL HIGH (ref 4.0–10.5)
WBC: 13.3 10*3/uL — AB (ref 4.0–10.5)

## 2014-10-28 LAB — BLOOD GAS, ARTERIAL
ACID-BASE DEFICIT: 4.4 mmol/L — AB (ref 0.0–2.0)
BICARBONATE: 21 meq/L (ref 20.0–24.0)
DRAWN BY: 11249
FIO2: 1 %
MECHVT: 610 mL
O2 SAT: 96.8 %
PEEP/CPAP: 12 cmH2O
Patient temperature: 98.8
RATE: 14 resp/min
TCO2: 18.8 mmol/L (ref 0–100)
pCO2 arterial: 42.4 mmHg (ref 35.0–45.0)
pH, Arterial: 7.317 — ABNORMAL LOW (ref 7.350–7.450)
pO2, Arterial: 99.1 mmHg (ref 80.0–100.0)

## 2014-10-28 LAB — LACTIC ACID, PLASMA
Lactic Acid, Venous: 2.4 mmol/L — ABNORMAL HIGH (ref 0.5–2.2)
Lactic Acid, Venous: 2.4 mmol/L — ABNORMAL HIGH (ref 0.5–2.2)
Lactic Acid, Venous: 1.9 mmol/L (ref 0.5–2.2)

## 2014-10-28 LAB — COMPREHENSIVE METABOLIC PANEL
ALT: 23 U/L (ref 0–53)
AST: 26 U/L (ref 0–37)
Albumin: 2.9 g/dL — ABNORMAL LOW (ref 3.5–5.2)
Alkaline Phosphatase: 33 U/L — ABNORMAL LOW (ref 39–117)
Anion gap: 7 (ref 5–15)
BILIRUBIN TOTAL: 0.8 mg/dL (ref 0.3–1.2)
BUN: 21 mg/dL (ref 6–23)
CALCIUM: 7.4 mg/dL — AB (ref 8.4–10.5)
CHLORIDE: 106 meq/L (ref 96–112)
CO2: 21 mmol/L (ref 19–32)
Creatinine, Ser: 1.17 mg/dL (ref 0.50–1.35)
GFR calc Af Amer: 76 mL/min — ABNORMAL LOW (ref >90)
GFR calc non Af Amer: 66 mL/min — ABNORMAL LOW (ref >90)
Glucose, Bld: 150 mg/dL — ABNORMAL HIGH (ref 70–99)
POTASSIUM: 4.8 mmol/L (ref 3.5–5.1)
Sodium: 134 mmol/L — ABNORMAL LOW (ref 135–145)
Total Protein: 4.8 g/dL — ABNORMAL LOW (ref 6.0–8.3)

## 2014-10-28 LAB — MRSA PCR SCREENING: MRSA BY PCR: NEGATIVE

## 2014-10-28 LAB — CORTISOL: Cortisol, Plasma: 28.8 ug/dL

## 2014-10-28 LAB — TSH: TSH: 16.108 u[IU]/mL — AB (ref 0.350–4.500)

## 2014-10-28 LAB — TROPONIN I: Troponin I: <0.03 ng/mL (ref <0.031)

## 2014-10-28 LAB — TRIGLYCERIDES: Triglycerides: 109 mg/dL (ref <150)

## 2014-10-28 SURGERY — COLECTOMY, PARTIAL
Anesthesia: General

## 2014-10-28 MED ORDER — PNEUMOCOCCAL VAC POLYVALENT 25 MCG/0.5ML IJ INJ
0.5000 mL | INJECTION | INTRAMUSCULAR | Status: DC
  Filled 2014-10-28 (×2): qty 0.5

## 2014-10-28 MED ORDER — FENTANYL BOLUS VIA INFUSION
50.0000 ug | INTRAVENOUS | Status: DC | PRN
  Administered 2014-10-31 – 2014-11-02 (×9): 50 ug via INTRAVENOUS
  Filled 2014-10-28: qty 50

## 2014-10-28 MED ORDER — PROPOFOL 10 MG/ML IV EMUL
0.0000 ug/kg/min | INTRAVENOUS | Status: DC
  Administered 2014-10-28: 30 ug/kg/min via INTRAVENOUS
  Administered 2014-10-28 (×5): 40 ug/kg/min via INTRAVENOUS
  Administered 2014-10-29: 50 ug/kg/min via INTRAVENOUS
  Administered 2014-10-29: 35 ug/kg/min via INTRAVENOUS
  Administered 2014-10-29: 40 ug/kg/min via INTRAVENOUS
  Administered 2014-10-29 (×2): 50 ug/kg/min via INTRAVENOUS
  Administered 2014-10-29 (×2): 40 ug/kg/min via INTRAVENOUS
  Administered 2014-10-29: 50 ug/kg/min via INTRAVENOUS
  Administered 2014-10-29: 40 ug/kg/min via INTRAVENOUS
  Administered 2014-10-30 (×4): 50 ug/kg/min via INTRAVENOUS
  Administered 2014-10-30: 40 ug/kg/min via INTRAVENOUS
  Administered 2014-10-30 – 2014-10-31 (×3): 50 ug/kg/min via INTRAVENOUS
  Administered 2014-10-31: 45 ug/kg/min via INTRAVENOUS
  Administered 2014-10-31: 40 ug/kg/min via INTRAVENOUS
  Administered 2014-10-31: 45 ug/kg/min via INTRAVENOUS
  Administered 2014-10-31: 50 ug/kg/min via INTRAVENOUS
  Administered 2014-10-31: 25 ug/kg/min via INTRAVENOUS
  Administered 2014-10-31: 45 ug/kg/min via INTRAVENOUS
  Administered 2014-11-01 (×2): 30 ug/kg/min via INTRAVENOUS
  Administered 2014-11-01: 50 ug/kg/min via INTRAVENOUS
  Administered 2014-11-01 (×2): 40 ug/kg/min via INTRAVENOUS
  Administered 2014-11-01: 50 ug/kg/min via INTRAVENOUS
  Administered 2014-11-01 – 2014-11-02 (×3): 40 ug/kg/min via INTRAVENOUS
  Filled 2014-10-28 (×10): qty 100
  Filled 2014-10-28: qty 1000
  Filled 2014-10-28 (×2): qty 100
  Filled 2014-10-28: qty 200
  Filled 2014-10-28 (×26): qty 100

## 2014-10-28 MED ORDER — SUFENTANIL CITRATE 50 MCG/ML IV SOLN
INTRAVENOUS | Status: DC | PRN
  Administered 2014-10-28: 10 ug via INTRAVENOUS
  Administered 2014-10-28: 20 ug via INTRAVENOUS
  Administered 2014-10-28 (×4): 10 ug via INTRAVENOUS

## 2014-10-28 MED ORDER — CETYLPYRIDINIUM CHLORIDE 0.05 % MT LIQD
7.0000 mL | Freq: Four times a day (QID) | OROMUCOSAL | Status: DC
Start: 2014-10-28 — End: 2014-11-21
  Administered 2014-10-28 – 2014-11-21 (×90): 7 mL via OROMUCOSAL

## 2014-10-28 MED ORDER — MIDAZOLAM HCL 2 MG/2ML IJ SOLN
INTRAMUSCULAR | Status: AC
  Filled 2014-10-28: qty 2

## 2014-10-28 MED ORDER — METRONIDAZOLE IN NACL 5-0.79 MG/ML-% IV SOLN
500.0000 mg | Freq: Once | INTRAVENOUS | Status: AC
  Administered 2014-10-28: 500 mg via INTRAVENOUS

## 2014-10-28 MED ORDER — SUCCINYLCHOLINE CHLORIDE 20 MG/ML IJ SOLN
INTRAMUSCULAR | Status: DC | PRN
  Administered 2014-10-28: 20 mg via INTRAVENOUS

## 2014-10-28 MED ORDER — CISATRACURIUM BESYLATE 20 MG/10ML IV SOLN
INTRAVENOUS | Status: AC
  Filled 2014-10-28: qty 10

## 2014-10-28 MED ORDER — PROPOFOL 10 MG/ML IV BOLUS
INTRAVENOUS | Status: DC | PRN
  Administered 2014-10-28: 200 mg via INTRAVENOUS

## 2014-10-28 MED ORDER — LACTATED RINGERS IV SOLN
INTRAVENOUS | Status: DC | PRN
  Administered 2014-10-28 (×2): via INTRAVENOUS

## 2014-10-28 MED ORDER — LACTATED RINGERS IV SOLN: INTRAVENOUS | Status: DC

## 2014-10-28 MED ORDER — 0.9 % SODIUM CHLORIDE (POUR BTL) OPTIME
TOPICAL | Status: DC | PRN
  Administered 2014-10-28 (×6): 1000 mL

## 2014-10-28 MED ORDER — MORPHINE SULFATE 2 MG/ML IJ SOLN: 2.0000 mg | INTRAMUSCULAR | Status: DC | PRN

## 2014-10-28 MED ORDER — SODIUM CHLORIDE 0.9 % IJ SOLN
INTRAMUSCULAR | Status: AC
  Filled 2014-10-28: qty 10

## 2014-10-28 MED ORDER — SODIUM CHLORIDE 0.9 % IV SOLN: 100.0000 mg | Freq: Every day | INTRAVENOUS | Status: DC

## 2014-10-28 MED ORDER — ONDANSETRON HCL 4 MG/2ML IJ SOLN: 4.0000 mg | Freq: Four times a day (QID) | INTRAMUSCULAR | Status: DC | PRN

## 2014-10-28 MED ORDER — SODIUM CHLORIDE 0.9 % IV SOLN
INTRAVENOUS | Status: DC | PRN
  Administered 2014-10-28: 01:00:00 via INTRAVENOUS

## 2014-10-28 MED ORDER — SUFENTANIL CITRATE 50 MCG/ML IV SOLN
INTRAVENOUS | Status: AC
Start: 2014-10-28 — End: 2014-10-28
  Filled 2014-10-28: qty 1

## 2014-10-28 MED ORDER — HYDROMORPHONE HCL 1 MG/ML IJ SOLN
INTRAMUSCULAR | Status: DC | PRN
  Administered 2014-10-28 (×2): .6 mg via INTRAVENOUS
  Administered 2014-10-28: .2 mg via INTRAVENOUS
  Administered 2014-10-28: .4 mg via INTRAVENOUS

## 2014-10-28 MED ORDER — CHLORHEXIDINE GLUCONATE 0.12 % MT SOLN
15.0000 mL | Freq: Two times a day (BID) | OROMUCOSAL | Status: DC
  Administered 2014-10-28 – 2014-11-21 (×50): 15 mL via OROMUCOSAL
  Filled 2014-10-28 (×48): qty 15

## 2014-10-28 MED ORDER — MIDAZOLAM HCL 2 MG/2ML IJ SOLN
INTRAMUSCULAR | Status: DC | PRN
  Administered 2014-10-28: 2 mg via INTRAVENOUS

## 2014-10-28 MED ORDER — PHENYLEPHRINE HCL 10 MG/ML IJ SOLN
INTRAMUSCULAR | Status: DC | PRN
  Administered 2014-10-28 (×4): 80 ug via INTRAVENOUS

## 2014-10-28 MED ORDER — HYDROMORPHONE HCL 2 MG/ML IJ SOLN
INTRAMUSCULAR | Status: AC
  Filled 2014-10-28: qty 1

## 2014-10-28 MED ORDER — VANCOMYCIN HCL IN DEXTROSE 1-5 GM/200ML-% IV SOLN
1000.0000 mg | Freq: Two times a day (BID) | INTRAVENOUS | Status: DC
  Administered 2014-10-28 – 2014-10-30 (×5): 1000 mg via INTRAVENOUS
  Filled 2014-10-28 (×5): qty 200

## 2014-10-28 MED ORDER — NOREPINEPHRINE BITARTRATE 1 MG/ML IV SOLN
0.0000 ug/min | INTRAVENOUS | Status: DC
  Administered 2014-10-28: 10 ug/min via INTRAVENOUS
  Administered 2014-10-28: 5 ug/min via INTRAVENOUS
  Administered 2014-10-28: 2 ug/min via INTRAVENOUS
  Administered 2014-10-29 – 2014-10-30 (×2): 3 ug/min via INTRAVENOUS
  Administered 2014-10-30: 2 ug/min via INTRAVENOUS
  Filled 2014-10-28 (×5): qty 4

## 2014-10-28 MED ORDER — LACTATED RINGERS IV SOLN
INTRAVENOUS | Status: DC | PRN
  Administered 2014-10-28 (×3): via INTRAVENOUS

## 2014-10-28 MED ORDER — SODIUM CHLORIDE 0.9 % IV SOLN: INTRAVENOUS | Status: DC

## 2014-10-28 MED ORDER — SUFENTANIL CITRATE 50 MCG/ML IV SOLN
INTRAVENOUS | Status: AC
  Filled 2014-10-28: qty 1

## 2014-10-28 MED ORDER — METRONIDAZOLE IN NACL 5-0.79 MG/ML-% IV SOLN: 500.0000 mg | Freq: Three times a day (TID) | INTRAVENOUS | Status: DC

## 2014-10-28 MED ORDER — CISATRACURIUM BESYLATE (PF) 10 MG/5ML IV SOLN
INTRAVENOUS | Status: DC | PRN
  Administered 2014-10-28: 10 mg via INTRAVENOUS
  Administered 2014-10-28: 12 mg via INTRAVENOUS
  Administered 2014-10-28: 8 mg via INTRAVENOUS
  Administered 2014-10-28: 10 mg via INTRAVENOUS

## 2014-10-28 MED ORDER — SODIUM CHLORIDE 0.9 % IV SOLN
25.0000 ug/h | INTRAVENOUS | Status: DC
  Administered 2014-10-28: 25 ug/h via INTRAVENOUS
  Administered 2014-10-30 – 2014-11-01 (×3): 75 ug/h via INTRAVENOUS
  Administered 2014-11-02: 100 ug/h via INTRAVENOUS
  Filled 2014-10-28 (×6): qty 50

## 2014-10-28 MED ORDER — METRONIDAZOLE IN NACL 5-0.79 MG/ML-% IV SOLN
INTRAVENOUS | Status: AC
  Filled 2014-10-28: qty 100

## 2014-10-28 MED ORDER — KCL IN DEXTROSE-NACL 20-5-0.9 MEQ/L-%-% IV SOLN
INTRAVENOUS | Status: DC
Start: 2014-10-28 — End: 2014-10-28

## 2014-10-28 MED ORDER — PIPERACILLIN-TAZOBACTAM 3.375 G IVPB
3.3750 g | Freq: Three times a day (TID) | INTRAVENOUS | Status: DC
  Administered 2014-10-28 – 2014-11-04 (×21): 3.375 g via INTRAVENOUS
  Filled 2014-10-28 (×17): qty 50

## 2014-10-28 MED ORDER — HEPARIN SODIUM (PORCINE) 5000 UNIT/ML IJ SOLN
5000.0000 [IU] | Freq: Three times a day (TID) | INTRAMUSCULAR | Status: DC
  Administered 2014-10-29 – 2014-11-13 (×45): 5000 [IU] via SUBCUTANEOUS
  Filled 2014-10-28 (×43): qty 1

## 2014-10-28 MED ORDER — DEXAMETHASONE SODIUM PHOSPHATE 10 MG/ML IJ SOLN
INTRAMUSCULAR | Status: DC | PRN
  Administered 2014-10-28: 10 mg via INTRAVENOUS

## 2014-10-28 MED ORDER — PHENYLEPHRINE HCL 10 MG/ML IJ SOLN
INTRAMUSCULAR | Status: AC
Start: 2014-10-28 — End: 2014-10-28
  Filled 2014-10-28: qty 2

## 2014-10-28 MED ORDER — ONDANSETRON HCL 4 MG/2ML IJ SOLN
INTRAMUSCULAR | Status: DC | PRN
  Administered 2014-10-28: 4 mg via INTRAVENOUS

## 2014-10-28 MED ORDER — LIDOCAINE HCL (CARDIAC) 20 MG/ML IV SOLN
INTRAVENOUS | Status: DC | PRN
  Administered 2014-10-28: 100 mg via INTRAVENOUS

## 2014-10-28 MED ORDER — DEXAMETHASONE SODIUM PHOSPHATE 10 MG/ML IJ SOLN
INTRAMUSCULAR | Status: AC
  Filled 2014-10-28: qty 1

## 2014-10-28 MED ORDER — SODIUM CHLORIDE 0.9 % IV SOLN
100.0000 mg | Freq: Every day | INTRAVENOUS | Status: DC
  Administered 2014-10-28 – 2014-11-04 (×8): 100 mg via INTRAVENOUS
  Filled 2014-10-28 (×9): qty 100

## 2014-10-28 MED ORDER — SODIUM CHLORIDE 0.9 % IV SOLN
1750.0000 mg | Freq: Once | INTRAVENOUS | Status: AC
  Administered 2014-10-28: 1750 mg via INTRAVENOUS
  Filled 2014-10-28: qty 1750

## 2014-10-28 MED ORDER — PANTOPRAZOLE SODIUM 40 MG IV SOLR
40.0000 mg | Freq: Every day | INTRAVENOUS | Status: DC
  Administered 2014-10-28 – 2014-10-31 (×4): 40 mg via INTRAVENOUS
  Filled 2014-10-28 (×4): qty 40

## 2014-10-28 MED ORDER — PROPOFOL 10 MG/ML IV EMUL
INTRAVENOUS | Status: AC
  Filled 2014-10-28: qty 100

## 2014-10-28 MED ORDER — VANCOMYCIN HCL 10 G IV SOLR: 1250.0000 mg | Freq: Two times a day (BID) | INTRAVENOUS | Status: DC

## 2014-10-28 MED ORDER — FENTANYL CITRATE 0.05 MG/ML IJ SOLN
50.0000 ug | Freq: Once | INTRAMUSCULAR | Status: AC
  Administered 2014-10-28: 50 ug via INTRAVENOUS
  Filled 2014-10-28: qty 2

## 2014-10-28 SURGICAL SUPPLY — 53 items
APPLICATOR COTTON TIP 6IN STRL (MISCELLANEOUS) ×6 IMPLANT
BLADE EXTENDED COATED 6.5IN (ELECTRODE) ×3 IMPLANT
BLADE HEX COATED 2.75 (ELECTRODE) ×3 IMPLANT
BLADE SURG SZ10 CARB STEEL (BLADE) ×3 IMPLANT
BNDG GAUZE ELAST 4 BULKY (GAUZE/BANDAGES/DRESSINGS) ×3 IMPLANT
CLIP TI LARGE 6 (CLIP) IMPLANT
COVER MAYO STAND STRL (DRAPES) ×3 IMPLANT
DRAPE LAPAROSCOPIC ABDOMINAL (DRAPES) ×3 IMPLANT
DRAPE SHEET LG 3/4 BI-LAMINATE (DRAPES) IMPLANT
DRAPE WARM FLUID 44X44 (DRAPE) ×3 IMPLANT
DRSG PAD ABDOMINAL 8X10 ST (GAUZE/BANDAGES/DRESSINGS) IMPLANT
ELECT REM PT RETURN 9FT ADLT (ELECTROSURGICAL) ×3
ELECTRODE REM PT RTRN 9FT ADLT (ELECTROSURGICAL) ×1 IMPLANT
ENSEAL DEVICE STD TIP 35CM (ENDOMECHANICALS) IMPLANT
GAUZE SPONGE 4X4 12PLY STRL (GAUZE/BANDAGES/DRESSINGS) ×3 IMPLANT
GLOVE BIO SURGEON STRL SZ7.5 (GLOVE) ×18 IMPLANT
GLOVE BIOGEL PI IND STRL 7.0 (GLOVE) ×1 IMPLANT
GLOVE BIOGEL PI INDICATOR 7.0 (GLOVE) ×2
GOWN STRL REUS W/TWL LRG LVL3 (GOWN DISPOSABLE) ×3 IMPLANT
GOWN STRL REUS W/TWL XL LVL3 (GOWN DISPOSABLE) ×6 IMPLANT
KIT BASIN OR (CUSTOM PROCEDURE TRAY) ×3 IMPLANT
LEGGING LITHOTOMY PAIR STRL (DRAPES) IMPLANT
LIGASURE IMPACT 36 18CM CVD LR (INSTRUMENTS) ×3 IMPLANT
NS IRRIG 1000ML POUR BTL (IV SOLUTION) ×18 IMPLANT
PACK GENERAL/GYN (CUSTOM PROCEDURE TRAY) ×3 IMPLANT
PAD ABD 8X10 STRL (GAUZE/BANDAGES/DRESSINGS) ×3 IMPLANT
PENCIL BUTTON HOLSTER BLD 10FT (ELECTRODE) ×3 IMPLANT
RELOAD PROXIMATE 75MM BLUE (ENDOMECHANICALS) ×6 IMPLANT
SHEARS HARMONIC ACE PLUS 36CM (ENDOMECHANICALS) IMPLANT
STAPLER PROXIMATE 75MM BLUE (STAPLE) ×3 IMPLANT
STAPLER VISISTAT 35W (STAPLE) ×3 IMPLANT
SUCTION POOLE TIP (SUCTIONS) ×6 IMPLANT
SUT NOV 1 T60/GS (SUTURE) IMPLANT
SUT NOVA NAB DX-16 0-1 5-0 T12 (SUTURE) IMPLANT
SUT NOVA T20/GS 25 (SUTURE) ×6 IMPLANT
SUT PDS AB 1 CTX 36 (SUTURE) ×6 IMPLANT
SUT PDS AB 1 TP1 54 (SUTURE) IMPLANT
SUT PDS AB 1 TP1 96 (SUTURE) ×6 IMPLANT
SUT PROLENE 2 0 KS (SUTURE) IMPLANT
SUT PROLENE 2 0 SH DA (SUTURE) ×3 IMPLANT
SUT SILK 2 0 (SUTURE) ×2
SUT SILK 2 0 SH CR/8 (SUTURE) ×3 IMPLANT
SUT SILK 2 0SH CR/8 30 (SUTURE) IMPLANT
SUT SILK 2-0 18XBRD TIE 12 (SUTURE) ×1 IMPLANT
SUT SILK 2-0 30XBRD TIE 12 (SUTURE) IMPLANT
SUT SILK 3 0 (SUTURE) ×4
SUT SILK 3 0 SH CR/8 (SUTURE) ×3 IMPLANT
SUT SILK 3-0 18XBRD TIE 12 (SUTURE) ×2 IMPLANT
TOWEL OR 17X26 10 PK STRL BLUE (TOWEL DISPOSABLE) ×6 IMPLANT
TRAY FOLEY CATH 14FRSI W/METER (CATHETERS) ×3 IMPLANT
TUBE ANAEROBIC SPECIMEN COL (MISCELLANEOUS) ×3 IMPLANT
WATER STERILE IRR 1500ML POUR (IV SOLUTION) IMPLANT
YANKAUER SUCT BULB TIP NO VENT (SUCTIONS) ×3 IMPLANT

## 2014-10-28 NOTE — Progress Notes (Signed)
PULMONARY / CRITICAL CARE MEDICINE HISTORY AND PHYSICAL EXAMINATION   Name: William [name] MRN: [id number] DOB: 11/11/1950    ADMISSION DATE:  10/27/2014  PRIMARY SERVICE: PCCM  CHIEF COMPLAINT:  Perforated sigmoid colon  BRIEF PATIENT DESCRIPTION:   Mr. William [name] is a 64 yo M with HTN and nephrolithiasis who [facility]10 with abdominal pain of 1 day duration. The following is obtained from chart review as the patient is currently intubated and sedated. He was found to have a perforated sigmoid colon and was planned to be managed conservatively but became hypotensive and required urgent intervention.   SIGNIFICANT EVENTS / STUDIES:  Partial colectomy 1/10  LINES / TUBES: ETT 1/10 L femoral CVC 1/11 Art Line to be placed 1/11  CULTURES: Abscess culture 1/11 Blood culture 1/11 Urine culture 1/11  ANTIBIOTICS: Vanc 1/10 - Zosyn 1/10 - Micafungin 1/10 -  SUBJECTIVE:   Repeat rounds 10/28/14: wife at bedside. Needing 70% fio2, peep 12. Some 100cc bleeding from surgical sites since 7am but coming dowon on pressors. On diprivan and fent gtt  VITAL SIGNS: Temp:  [97.7 F (36.5 C)-100.9 F (38.3 C)] 98.8 F (37.1 C) (01/11 0800) Pulse Rate:  [88-130] 90 (01/11 1030) Resp:  [12-27] 17 (01/11 1030) BP: (70-203)/(35-111) 122/63 mmHg (01/11 1030) SpO2:  [88 %-96 %] 94 % (01/11 1030) FiO2 (%):  [70 %-100 %] 70 % (01/11 0800) Weight:  [127.007 kg (280 lb)] 127.007 kg (280 lb) (01/10 2138) HEMODYNAMICS:   VENTILATOR SETTINGS: Vent Mode:  [-] PRVC FiO2 (%):  [70 %-100 %] 70 % Set Rate:  [14 bmp] 14 bmp Vt Set:  [610 mL] 610 mL PEEP:  [7 cmH20-12 cmH20] 12 cmH20 Plateau Pressure:  [21 cmH20] 21 cmH20 INTAKE / OUTPUT: Intake/Output      01/10 0701 - 01/11 0700 01/11 0701 - 01/12 0700   I.V. (mL/kg) 6322 (49.8) 366.5 (2.9)   Other 140    NG/GT  30   IV Piggyback 1600 25   Total Intake(mL/kg) 8062 (63.5) 421.5 (3.3)   Urine (mL/kg/hr) 350 1750 (3.3)   Emesis/NG  output  100 (0.2)   Blood 250    Total Output 600 1850   Net +7462 -1428.5          PHYSICAL EXAMINATION: General:  Obese male, intubated and sedated Neuro:  Sedated HEENT:  Sclera anicteric, conjunctiva pink, ETT present Neck: Obese, no obvious JVD or LAN Cardiovascular:  RRR, NS1/S2, (-) MRG Lungs:  Coarse mechanical BS bilaterally Abdomen:  Distended and tense. Dressings - ostomy bag with fresh blood + - CCS aware per RN Musculoskeletal:  (-) C/C/E Skin:  Intact  LABS:  CBC  Recent Labs Lab 10/27/14 1810 10/28/14 0555  WBC 15.5* 14.0*  HGB 16.9 13.8  HCT 50.9 42.4  PLT 187 176   Coag's No results for input(s): APTT, INR in the last 168 hours. BMET  Recent Labs Lab 10/27/14 1810 10/28/14 0555  NA 134* 134*  K 4.0 4.8  CL 99 106  CO2 24 21  BUN 23 21  CREATININE 1.17 1.17  GLUCOSE 170* 150*   Electrolytes  Recent Labs Lab 10/27/14 1810 10/28/14 0555  CALCIUM 9.4 7.4*   Sepsis Markers  Recent Labs Lab 10/27/14 1830 10/27/14 1924 10/28/14 0554  LATICACIDVEN 4.76* 3.21* 2.4*   ABG  Recent Labs Lab 10/28/14 0704  PHART 7.317*  PCO2ART 42.4  PO2ART 99.1   Liver Enzymes  Recent Labs Lab 10/27/14 1810 10/28/14 0555  AST 29  26  ALT 24 23  ALKPHOS 53 33*  BILITOT 0.7 0.8  ALBUMIN 4.6 2.9*   Cardiac Enzymes  Recent Labs Lab 10/28/14 0555  TROPONINI <0.03   Glucose No results for input(s): GLUCAP in the last 168 hours.  Imaging Ct Abdomen Pelvis W Contrast  10/27/2014   CLINICAL DATA:  Initial evaluation sudden onset left abdominal pain today, personal history of umbilical hernia kidney stones hypertension, elevated white blood count  EXAM: CT ABDOMEN AND PELVIS WITH CONTRAST  TECHNIQUE: Multidetector CT imaging of the abdomen and pelvis was performed using the standard protocol following bolus administration of intravenous contrast.  CONTRAST:  50mL OMNIPAQUE IOHEXOL 300 MG/ML SOLN, OMNIPAQUE IOHEXOL 300 MG/ML SOLN   COMPARISON:  None.  FINDINGS: Liver and gallbladder are normal. Spleen is normal. Pancreas is normal. Adrenal glands are normal.  Numerous small bilateral nonobstructing renal calculi all measuring less than or equal to about 6 mm. Mild bilateral perinephric inflammatory changes likely chronic. No hydronephrosis. Bladder is normal.  Mild calcification of the abdominal aorta. No significant retroperitoneal adenopathy. Reproductive organs are normal. Fat containing inguinal hernias bilaterally.  Periumbilical hernia identified containing a loop of small bowel. Hernia neck is narrow at 2.5 cm and the hernia measures 2.5 x 5.9 cm. No evidence of obstruction or strangulation however.  Relatively mobile large bowel with cecum in the right mid abdomen to upper quadrant. Air-fluid levels proximal large bowel. Diverticulosis throughout the descending and sigmoid colon. Mild inflammatory change posterior to the proximal sigmoid colon. Additionally, there is an extraluminal collection of gas and debris measuring 61 x 66 mm immediately posterior and caudal to the proximal sigmoid colon.  No free fluid.  No free air.  No acute musculoskeletal findings.  A mm right lower lobe pulmonary nodule. Atelectasis right lung base. Atelectasis left lung base. Mild cardiac enlargement.  IMPRESSION: 1. Diverticulitis proximal sigmoid colon. There is evidence of a focal contained perforation measuring 6.6 x 6.1 cm. 2. Narrow neck umbilical hernia contains loop of small bowel currently with no evidence of obstruction or strangulation. 3. Nonobstructing bilateral renal calculi 4. Bilateral lower lobe atelectasis. 8 mm pulmonary nodule right lower lobe. If the patient is at high risk for bronchogenic carcinoma, follow-up chest CT at 3-49months is recommended. If the patient is at low risk for bronchogenic carcinoma, follow-up chest CT at 6-12 months is recommended. This recommendation follows the consensus statement: Guidelines for Management of  Small Pulmonary Nodules Detected on CT Scans: A Statement from the Fleischner Society as published in Radiology 2005; 237:395-400.   Electronically Signed   By: Esperanza Heir M.D.   On: 10/27/2014 20:18   Dg Chest Port 1 View  10/28/2014   CLINICAL DATA:  Acute onset of respiratory failure. Subsequent encounter.  EXAM: PORTABLE CHEST - 1 VIEW  COMPARISON:  Chest radiograph performed earlier today at 3:58 a.m.  FINDINGS: The patient's endotracheal tube is seen ending 4 cm above the carina. An enteric tube is noted extending below the diaphragm.  Lung expansion is mildly improved. Vascular congestion is noted, with bibasilar airspace opacities, concerning for pulmonary edema. A small left pleural effusion is noted. No pneumothorax is seen.  The cardiomediastinal silhouette is borderline normal in size. No acute osseous abnormalities are identified.  IMPRESSION: 1. Endotracheal tube seen ending 4 cm above the carina. 2. Lung expansion mildly improved. Persistent relatively stable airspace opacities likely reflect pulmonary edema, though pneumonia might have a similar appearance. Small left pleural effusion noted.   Electronically  Signed   By: Roanna Raider M.D.   On: 10/28/2014 05:59   Portable Chest Xray  10/28/2014   CLINICAL DATA:  Sepsis.  Initial encounter.  EXAM: PORTABLE CHEST - 1 VIEW  COMPARISON:  Chest radiograph performed 10/27/2014  FINDINGS: The patient's endotracheal tube is seen ending 3 cm above the carina. An enteric tube is noted extending below the diaphragm.  The lungs are hypoexpanded. Vascular crowding and vascular congestion are seen. Bilateral central airspace opacities raise concern for pulmonary edema, though pneumonia could have a similar appearance. A small left pleural effusion is suspected. No pneumothorax is seen.  The cardiomediastinal silhouette is borderline normal in size. No acute osseous abnormalities are identified.  IMPRESSION: 1. Endotracheal tube seen ending 3 cm above  the carina. 2. Lungs hypoexpanded. Vascular congestion noted. Bilateral central airspace opacities raise concern for pulmonary edema, though pneumonia could have a similar appearance. Small left pleural effusion suspected.   Electronically Signed   By: Roanna Raider M.D.   On: 10/28/2014 04:29   Dg Chest Port 1 View  10/27/2014   CLINICAL DATA:  Mid abdominal pain.  EXAM: PORTABLE CHEST - 1 VIEW  COMPARISON:  None.  FINDINGS: Mild enlargement of the cardiopericardial silhouette with cephalization of blood flow. No definite Kerley B-lines. Indistinct airspace opacity along the left hemidiaphragm. Mild blunting of the left lateral costophrenic angle.  IMPRESSION: 1. Mild enlargement of the cardiopericardial silhouette with cephalization of blood flow favoring pulmonary venous hypertension. 2. Suspected trace left pleural effusion. Suspected atelectasis at the left lung base.   Electronically Signed   By: Herbie Baltimore M.D.   On: 10/27/2014 19:23    EKG: Not obtained. CXR: Personally reviewed. Poor quality film. ? Edema  ASSESSMENT / PLAN:  Active Problems:   Diverticulitis of colon with perforation   Shock circulatory   Acute respiratory failure with hypoxia   Acute post-operative pain   Solitary pulmonary nodule on lung CT   Sleep apnea   PULMONARY A: Baseline  - Obese, OSA on CPAP Now Acute Hypoxic Resp Failure: ? Developing ARDS vs. restriction from abdomen. 8 mm RLL Nodule:   - still on 70% fio2, no evidence of abd compartment syndrome. Suspect ALI  P:   Lung protective ventilation Serial ABGs VAP prevention SBT/WUA when surgical plan clear Will need follow-up CTs as OP for nodule  CARDIOVASCULAR   A: Shock: Almost certainly 2/2 perforation. S/p ~ 5.7 L fluid resuscitation.  HTN   - improving circulatory shock. NSTEMI stress likely +. Slowly resolving lactic acidosis P:   Serial lactates Get ECHO Norepi to maintain MAP of 65 Follow-up  cortisol  RENAL   A: Hyponatremia: Likely 2/2 mild dehydration. P:   Serial BMPs  GASTROINTESTINAL A: Perforated Sigmoid: P:   Management per surgery  HEMATOLOGIC A: Having active bleeding that is slow from surgical sites 10/28/2014 -re[peat rounds. CCS aware  P CBC Q6h  INFECTIOUS A: Intraabdominal abscess:  P:   Continue empiric Vanc/Zosyn/Mica Follow-up cultures  ENDOCRINE A: Hyperglycemia:  P:   SSI A1c  NEUROLOGIC A: Sever post op pain  P Diprivan gtt Fent gtt  BEST PRACTICE / DISPOSITION Level of Care:  ICU Primary Service:  PCCM Consultants:  Surgery Code Status:  Full Diet:  NPO DVT Px:  SQH GI Px:  PPI Skin Integrity:  Intact Social / Family:  Wife updated at bedside 10/28/2014   TODAY'S SUMMARY:  Improving circulatory shock but ALi with 70% fio2 need and surgical site bleeding + -  slow and needs active monitoring    The patient is critically ill with multiple organ systems failure and requires high complexity decision making for assessment and support, frequent evaluation and titration of therapies, application of advanced monitoring technologies and extensive interpretation of multiple databases.   Critical Care Time devoted to patient care services described in this note is  45  Minutes. This time reflects time of care of this signee Dr Kalman Shan. This critical care time does not reflect procedure time, or teaching time or supervisory time of PA/NP/Med student/Med Resident etc but could involve care discussion time    Dr. Kalman Shan, M.D., [facility].C.P Pulmonary and Critical Care Medicine Staff Physician [facility] Pager: [phone], If no answer or between  15:00h - 7:00h: call [phone]  10/28/2014 11:17 AM

## 2014-10-28 NOTE — Progress Notes (Signed)
  Echocardiogram 2D Echocardiogram has been performed.  Leta JunglingCooper, Sofija Antwi M 10/28/2014, 3:15 PM

## 2014-10-28 NOTE — Progress Notes (Signed)
ANTIBIOTIC CONSULT NOTE - FOLLOW UP  Pharmacy Consult for Vancomycin, Zosyn Indication: Sepsis secondary to intraabdominal abscess  Allergies  Allergen Reactions  . Aloprim [Allopurinol] Other (See Comments)    Unknown reaction  . Simvastatin Other (See Comments)    Unknown reaction    Patient Measurements: Height: 5' 11.5" (181.6 cm) Weight: 280 lb (127.007 kg) IBW/kg (Calculated) : 76.45  Vital Signs: Temp: 98.8 F (37.1 C) (01/11 0530) Temp Source: Axillary (01/11 0530) BP: 120/57 mmHg (01/11 0730) Pulse Rate: 88 (01/11 0730) Intake/Output from previous day: 01/10 0701 - 01/11 0700 In: 8002 [I.V.:6262; IV Piggyback:1600] Out: 600 [Urine:350; Blood:250] Intake/Output from this shift:    Labs:  Recent Labs  10/27/14 1810 10/28/14 0555  WBC 15.5* 14.0*  HGB 16.9 13.8  PLT 187 176  CREATININE 1.17 1.17   Estimated Creatinine Clearance: 90.7 mL/min (by C-G formula based on Cr of 1.17). No results for input(s): VANCOTROUGH, VANCOPEAK, VANCORANDOM, GENTTROUGH, GENTPEAK, GENTRANDOM, TOBRATROUGH, TOBRAPEAK, TOBRARND, AMIKACINPEAK, AMIKACINTROU, AMIKACIN in the last 72 hours.   Microbiology: No results found for this or any previous visit (from the past 720 hour(s)).  Assessment: [age] y/oM with HTN and nephrolithiasis who [facility] with abdominal pain of 1 day duration secondary to a perforated sigmoid colon. He was taken emergently to OR on 1/10 and is now s/p partial colectomy.  Pharmacy consulted to assist with dosing of Vancomycin and Zosyn for sepsis secondary to intraabdominal abscess. Patient is also on Micafungin per MD.  1/11 >> Zosyn >> 1/11 >> Vancomycin >> 1/11 >> Micafungin >>  Tmax: 100.9 F WBCs: elevated, 14 Renal: SCr stable at 1.17, CrCl 91 mL/min CG, 67 mL/min N  1/11 blood x 2: sent 1/11 abscess: sent   Goal of Therapy:  Vancomycin trough level 15-20 mcg/ml  Appropriate antibiotic dosing for renal function and  indication Eradication of infection  Plan:   Change Vancomycin to 1 gram IV q12h to start at 1400 today per normalized CrCl, obesity nomogram.  Plan for Vancomycin trough level at steady state.  Continue Zosyn 3.375g IV q8h (infuse over 4 hours)  Continue Micafungin 100mg  IV q24h per MD.  Monitor renal function, cultures, clinical course.   Greer PickerelJigna Rica Heather, PharmD, BCPS Pager: [phone] 10/28/2014 8:30 AM

## 2014-10-28 NOTE — Anesthesia Postprocedure Evaluation (Signed)
  Anesthesia Post-op Note  Patient: William [name]  Procedure(s) Performed: Procedure(s): PARTIAL COLECTOMY Sigmoid (N/A) COLOSTOMY (N/A) EXPLORATORY LAPAROTOMY (N/A) Patient is intubated to ICU  Vital signs are stable and clinically acceptable. Oxygen saturation is clinically acceptable; Good chest movement There are no apparent anesthetic complications at this time.  Difficulty with 2 person Mask ventilation told to Lisabeth Register(Hans) RN [facility]

## 2014-10-28 NOTE — Progress Notes (Signed)
Bite block removed. Pt tolerated well, not biting ETT. RN aware.

## 2014-10-28 NOTE — Procedures (Signed)
Central Venous Catheter Insertion Procedure Note Johnney OuJoseph Kicklighter [id number] 08/12/1951  Procedure: Insertion of Central Venous Catheter Indications: Assessment of intravascular volume, Drug and/or fluid administration and Frequent blood sampling  Procedure Details Consent: Risks of procedure as well as the alternatives and risks of each were explained to the (patient/caregiver).  Consent for procedure obtained. Time Out: Verified patient identification, verified procedure, site/side was marked, verified correct patient position, special equipment/implants available, medications/allergies/relevent history reviewed, required imaging and test results available.  Performed  Maximum sterile technique was used including antiseptics, cap, gloves, gown, hand hygiene, mask and sheet. Skin prep: Chlorhexidine; local anesthetic administered  A antimicrobial bonded/coated triple lumen catheter was placed in the left femoral vein due to multiple attempts, no other available access using the Seldinger technique. S/p unsuccessful L subclavian attempt.  Evaluation Blood flow good Complications: No apparent complications Patient did tolerate procedure well.  Jerri Glauser R. 10/28/2014, 5:17 AM  I used ultrasound to locate and access the vein/artery.

## 2014-10-28 NOTE — Progress Notes (Addendum)
ANTIBIOTIC CONSULT NOTE - INITIAL  Pharmacy Consult for zosyn, vancomycin  Indication: Intraabdominal infection  Allergies  Allergen Reactions  . Aloprim [Allopurinol] Other (See Comments)    Unknown reaction  . Simvastatin Other (See Comments)    Unknown reaction    Patient Measurements: Height: 5' 11.5" (181.6 cm) Weight: 280 lb (127.007 kg) IBW/kg (Calculated) : 76.45 Adjusted Body Weight:   Vital Signs: Temp: 100.9 F (38.3 C) (01/10 2038) Temp Source: Oral (01/10 2038) BP: 77/47 mmHg (01/11 0015) Pulse Rate: 118 (01/11 0015) Intake/Output from previous day: 01/10 0701 - 01/11 0700 In: 6700 [I.V.:5700; IV Piggyback:1000] Out: 600 [Urine:350; Blood:250] Intake/Output from this shift: Total I/O In: 6700 [I.V.:5700; IV Piggyback:1000] Out: 600 [Urine:350; Blood:250]  Labs:  Recent Labs  10/27/14 1810  WBC 15.5*  HGB 16.9  PLT 187  CREATININE 1.17   Estimated Creatinine Clearance: 90.7 mL/min (by C-G formula based on Cr of 1.17). No results for input(s): VANCOTROUGH, VANCOPEAK, VANCORANDOM, GENTTROUGH, GENTPEAK, GENTRANDOM, TOBRATROUGH, TOBRAPEAK, TOBRARND, AMIKACINPEAK, AMIKACINTROU, AMIKACIN in the last 72 hours.   Microbiology: No results found for this or any previous visit (from the past 720 hour(s)).  Medical History: Past Medical History  Diagnosis Date  . Renal disorder     kidney stones  . Hypertension   . Thyroid disease     Medications:  Anti-infectives    Start     Dose/Rate Route Frequency Ordered Stop   10/28/14 0800  metroNIDAZOLE (FLAGYL) IVPB 500 mg     500 mg100 mL/hr over 60 Minutes Intravenous Every 8 hours 10/28/14 0335     10/28/14 0600  piperacillin-tazobactam (ZOSYN) IVPB 3.375 g     3.375 g12.5 mL/hr over 240 Minutes Intravenous 3 times per day 10/28/14 0339     10/28/14 0030  [MAR Hold]  metroNIDAZOLE (FLAGYL) IVPB 500 mg     (MAR Hold since 10/28/14 0043)   500 mg100 mL/hr over 60 Minutes Intravenous  Once 10/28/14 0026  10/28/14 0200   10/27/14 2030  piperacillin-tazobactam (ZOSYN) IVPB 3.375 g     3.375 g100 mL/hr over 30 Minutes Intravenous  Once 10/27/14 2026 10/27/14 2110     Assessment: Patient with diverticulitis and perforated bowel.  First dose of antibiotics already given.  Goal of Therapy:  Zosyn based on renal function  Vancomycin level 15-20  Plan:  Measure antibiotic drug levels at steady state Follow up culture results . Zosyn 3.375g IV Q8H infused over 4hrs.  Vancomycin 1750mg  iv x1, then 1250mg  iv q12hr Aleene DavidsonGrimsley Jr, Puanani Gene Crowford 10/28/2014,3:40 AM

## 2014-10-28 NOTE — Consult Note (Addendum)
WOC ostomy consult note Stoma type/location: LLQ colostomy Stomal assessment/size: Slightly budded, edematous, oval, dusky, bleeding at 5-6 o'clock. Blood volume and rate of flow assessed with Dr. Michaell CowingGross at bedside and believed to be venous. Measurement is 1 and 1/8 inch x 2 inches today Peristomal assessment: intact, clear. Slight depression toward 9 o'clock toward midline incision  Treatment options for stomal/peristomal skin: skin barrier ring Output: serosanguinous Ostomy pouching: 2pc. , 2 and 3/4 inch pouching system with skin barrier ring Education provided: Wife at bedside. Minimal teaching today as operative procedure was less than 12 hours ago and she has not slept.  Dr. Michaell CowingGross briefly described need for ostomy and general POC while in ICU and thereafter, including the opportunity for reconnection should all go as planned in 3-6 months.   WOC wound consult note Reason for Consult:Placement of NPWT per Dr. Michaell CowingGross' request  Wound type:Midline surgical Pressure Ulcer POA: No Measurement:16cm x 3cm x 4cm Wound ZOX:WRUEbed:pink, moist Drainage (amount, consistency, odor) serous Periwound:intact Dressing procedure/placement/frequency: 1 piece of black granufoam sponge used as wound contact layer topped with thin film drape to secure.  Attached to continuous negative pressure at -125mmHg.  Patient was premedicated and tolerated well.  An immediate seal is achieved. Dressing is labeled.  Next change is due on Wednesday, 1/113/16.  WOC nursing team will follow, and will remain available to this patient, the nursing, surgical and medical teams.   Thanks, Ladona MowLaurie Kentley Blyden, MSN, RN, GNP, [place], CWON-AP [phone])

## 2014-10-28 NOTE — Progress Notes (Signed)
INITIAL NUTRITION ASSESSMENT  DOCUMENTATION CODES Per approved criteria  -Obesity Unspecified   INTERVENTION: - If diet unable to be advanced, recommend initiation of nutrition support (TPN vs TF.)  - If TF warranted, recommend initiation of Vital HP @ 20 ml/hr via OGT and increase by 10 ml every 4 hours to goal rate of 75 ml/hr.   Tube feeding regimen provides 1800 kcal (74% of needs), 158 grams of protein, and 1512 ml of H2O.   - RD will continue to monitor for nutrition care plan.    NUTRITION DIAGNOSIS: Inadequate oral intake related to inability to eat as evidenced by NPO.   Goal: Enteral nutrition to provide 60-70% of estimated calorie needs (22-25 kcals/kg ideal body weight) and 100% of estimated protein needs, based on ASPEN guidelines for hypocaloric, high protein feeding in critically ill obese individuals  Monitor:  Weight trend, NPO, vent status/settings, initiation of nutrition support, labs  Reason for Assessment: New Vent  64 y.o. male  Admitting Dx: <principal problem not specified>  ASSESSMENT: [age] M with HTN, nephrolithiasis who [facility] with abdominal pain secondary to a perforated sigmoid. Taken emergently to OR 1/10. PCCM asked to admit.  - Pt s/p partial colectomy 10/27/14.  - Pt with bleeding from surgical sites.  - Spoke with pt's wife who reported a recent increase in pt's weight (30 lbs in 2-3 months) due to "stress eating." She says that pt will often eat lunch that she packs for him at work as well as food provided to him. She reports an increase in the consumption of starchy foods such as potato chips and breads.   Patient is currently intubated on ventilator support MV: 11.6 L/min Temp (24hrs), Avg:99.3 F (37.4 C), Min:97.7 F (36.5 C), Max:100.9 F (38.3 C)  Propofol: 30.5 ml/hr providing 805 kcal.   Height: Ht Readings from Last 1 Encounters:  10/28/14 5' 11.5" (1.816 m)    Weight: Wt Readings from Last 1 Encounters:   10/27/14 280 lb (127.007 kg)    Ideal Body Weight: 76.5 kg  % Ideal Body Weight: 166%  Wt Readings from Last 10 Encounters:  10/27/14 280 lb (127.007 kg)    Usual Body Weight: 250 lbs  % Usual Body Weight: 112%  BMI:  Body mass index is 38.51 kg/(m^2).  Estimated Nutritional Needs: Kcal: 2409 Protein: 150-160 g Fluid: Per MD  Skin: closed incision on abdomen  Diet Order: Diet NPO time specified  EDUCATION NEEDS: -Education not appropriate at this time   Intake/Output Summary (Last 24 hours) at 10/28/14 1454 Last data filed at 10/28/14 1300  Gross per 24 hour  Intake 8806.95 ml  Output   2940 ml  Net 5866.95 ml    Last BM: pt with new colostomy, blood in output   Labs:   Recent Labs Lab 10/27/14 1810 10/28/14 0555  NA 134* 134*  K 4.0 4.8  CL 99 106  CO2 24 21  BUN 23 21  CREATININE 1.17 1.17  CALCIUM 9.4 7.4*  GLUCOSE 170* 150*    CBG (last 3)  No results for input(s): GLUCAP in the last 72 hours.  Scheduled Meds: . antiseptic oral rinse  7 mL Mouth Rinse QID  . chlorhexidine  15 mL Mouth Rinse BID  . heparin  5,000 Units Subcutaneous 3 times per day  . levothyroxine  50 mcg Intravenous Daily  . micafungin [facility](MYCAMINE) IV  100 mg Intravenous Q0600  . pantoprazole (PROTONIX) IV  40 mg Intravenous QHS  .  piperacillin-tazobactam (ZOSYN)  IV  3.375 g Intravenous 3 times per day  . propofol      . vancomycin  1,000 mg Intravenous Q12H    Continuous Infusions: . fentaNYL infusion INTRAVENOUS 75 mcg/hr (10/28/14 1300)  . norepinephrine (LEVOPHED) Adult infusion 6 mcg/min (10/28/14 1300)  . propofol 40 mcg/kg/min (10/28/14 1300)    Past Medical History  Diagnosis Date  . Renal disorder     kidney stones  . Hypertension   . Thyroid disease     History reviewed. No pertinent past surgical history.  Emmaline Kluver MS, RD, LDN

## 2014-10-28 NOTE — Anesthesia Preprocedure Evaluation (Addendum)
Anesthesia Evaluation  Patient identified by MRN, date of birth, ID band Patient awake    Reviewed: Allergy & Precautions, H&P , Patient's Chart, lab work & pertinent test results, reviewed documented beta blocker date and time   Airway Mallampati: III  TM Distance: >3 FB Neck ROM: full    Dental no notable dental hx. (+) Edentulous Upper   Pulmonary former smoker,  breath sounds clear to auscultation  Pulmonary exam normal       Cardiovascular hypertension, Rhythm:regular Rate:Normal     Neuro/Psych    GI/Hepatic   Endo/Other    Renal/GU      Musculoskeletal   Abdominal   Peds  Hematology   Anesthesia Other Findings Awake, conversant, in minimal pain or distress OSA; uses CPAP even with naps. RA O2 sat= 92% in holding area Hypertension; currently normotensive after fluids given with Hypotension in ER Morbid Obesity.... BMI 39 O/w recently well: no URI. Non-smoker x9 years Full Stomach( contrast dye for CT)  Reproductive/Obstetrics                            Anesthesia Physical Anesthesia Plan  ASA: III and emergent  Anesthesia Plan: General   Post-op Pain Management:    Induction: Intravenous, Rapid sequence and Cricoid pressure planned  Airway Management Planned: Oral ETT and Video Laryngoscope Planned  Additional Equipment:   Intra-op Plan:   Post-operative Plan: Extubation in OR  Informed Consent: I have reviewed the patients History and Physical, chart, labs and discussed the procedure including the risks, benefits and alternatives for the proposed anesthesia with the patient or authorized representative who has indicated his/her understanding and acceptance.   Dental Advisory Given and Dental advisory given  Plan Discussed with: CRNA and Surgeon  Anesthesia Plan Comments: (Routine monitors, I doubt we'll need a-line Possible post-op intubation necessary; RA Sat 92%  pre-op  Discussed general anesthesia, including possible nausea, instrumentation of airway, sore throat,pulmonary aspiration, etc. I asked if the were any outstanding questions, or  concerns before we proceeded. )       Anesthesia Quick Evaluation

## 2014-10-28 NOTE — H&P (Signed)
PULMONARY / CRITICAL CARE MEDICINE HISTORY AND PHYSICAL EXAMINATION   Name: William [name] MRN: [id number] DOB: 12/20/1950    ADMISSION DATE:  10/27/2014  PRIMARY SERVICE: PCCM  CHIEF COMPLAINT:  Perforated sigmoid colon  BRIEF PATIENT DESCRIPTION: 10 M with HTN, nephrolithiasis who presented to [facility] on 1/10 with abdominal pain secondary to a perforated sigmoid. Taken emergently to OR 1/10. PCCM asked to admit.  SIGNIFICANT EVENTS / STUDIES:  Partial colectomy 1/10  LINES / TUBES: ETT 1/10 L femoral CVC 1/11 Art Line to be placed 1/11  CULTURES: Abscess culture 1/11 Blood culture 1/11 Urine culture 1/11  ANTIBIOTICS: Vanc 1/10 - Zosyn 1/10 - Micafungin 1/10 -  HISTORY OF PRESENT ILLNESS:  William [name] is a 64 yo M with HTN and nephrolithiasis who presented to [facility] on 1/10 with abdominal pain of 1 day duration. The following is obtained from chart review as the patient is currently intubated and sedated. He was found to have a perforated sigmoid colon and was planned to be managed conservatively but became hypotensive and required urgent intervention.   PAST MEDICAL HISTORY :  Past Medical History  Diagnosis Date  . Renal disorder     kidney stones  . Hypertension   . Thyroid disease    History reviewed. No pertinent past surgical history. Prior to Admission medications   Medication Sig Start Date End Date Taking? Authorizing Provider  cholecalciferol (VITAMIN D) 1000 UNITS tablet Take 1,000 Units by mouth daily.   Yes Historical Provider, MD  levothyroxine (SYNTHROID, LEVOTHROID) 150 MCG tablet Take 150 mcg by mouth daily before breakfast.   Yes Historical Provider, MD  lisinopril (PRINIVIL,ZESTRIL) 10 MG tablet Take 10 mg by mouth daily.   Yes Historical Provider, MD  meloxicam (MOBIC) 15 MG tablet Take 15 mg by mouth daily.   Yes Historical Provider, MD   Allergies  Allergen Reactions  . Aloprim [Allopurinol] Other (See Comments)    Unknown reaction  . Simvastatin  Other (See Comments)    Unknown reaction    FAMILY HISTORY:  History reviewed. No pertinent family history. SOCIAL HISTORY:  reports that he has quit smoking. He does not have any smokeless tobacco history on file. His alcohol and drug histories are not on file.  REVIEW OF SYSTEMS:  Unable to obtain secondary to patient condition.  SUBJECTIVE:   VITAL SIGNS: Temp:  [97.7 F (36.5 C)-100.9 F (38.3 C)] 100.9 F (38.3 C) (01/10 2038) Pulse Rate:  [103-130] 105 (01/11 0415) Resp:  [13-27] 13 (01/11 0415) BP: (77-203)/(41-111) 89/41 mmHg (01/11 0415) SpO2:  [88 %-95 %] 88 % (01/11 0415) FiO2 (%):  [100 %] 100 % (01/11 0448) Weight:  [280 lb (127.007 kg)] 280 lb (127.007 kg) (01/10 2138) HEMODYNAMICS:   VENTILATOR SETTINGS: Vent Mode:  [-] PRVC FiO2 (%):  [100 %] 100 % Set Rate:  [14 bmp] 14 bmp Vt Set:  [610 mL] 610 mL PEEP:  [7 cmH20-12 cmH20] 12 cmH20 Plateau Pressure:  [21 cmH20] 21 cmH20 INTAKE / OUTPUT: Intake/Output      01/10 0701 - 01/11 0700   I.V. (mL/kg) 5700 (44.9)   IV Piggyback 1000   Total Intake(mL/kg) 6700 (52.8)   Urine (mL/kg/hr) 350   Blood 250   Total Output 600   Net +6100         PHYSICAL EXAMINATION: General:  Obese male, intubated and sedated Neuro:  Sedated HEENT:  Sclera anicteric, conjunctiva pink, ETT present Neck: Obese, no obvious JVD or LAN Cardiovascular:  RRR, NS1/S2, (-)  MRG Lungs:  Coarse mechanical BS bilaterally Abdomen:  Distended and tense. Dressings C/D/I Musculoskeletal:  (-) C/C/E Skin:  Intact  LABS:  CBC  Recent Labs Lab 10/27/14 1810  WBC 15.5*  HGB 16.9  HCT 50.9  PLT 187   Coag's No results for input(s): APTT, INR in the last 168 hours. BMET  Recent Labs Lab 10/27/14 1810  NA 134*  K 4.0  CL 99  CO2 24  BUN 23  CREATININE 1.17  GLUCOSE 170*   Electrolytes  Recent Labs Lab 10/27/14 1810  CALCIUM 9.4   Sepsis Markers  Recent Labs Lab 10/27/14 1830 10/27/14 1924  LATICACIDVEN  4.76* 3.21*   ABG No results for input(s): PHART, PCO2ART, PO2ART in the last 168 hours. Liver Enzymes  Recent Labs Lab 10/27/14 1810  AST 29  ALT 24  ALKPHOS 53  BILITOT 0.7  ALBUMIN 4.6   Cardiac Enzymes No results for input(s): TROPONINI, PROBNP in the last 168 hours. Glucose No results for input(s): GLUCAP in the last 168 hours.  Imaging Ct Abdomen Pelvis W Contrast  10/27/2014   CLINICAL DATA:  Initial evaluation sudden onset left abdominal pain today, personal history of umbilical hernia kidney stones hypertension, elevated white blood count  EXAM: CT ABDOMEN AND PELVIS WITH CONTRAST  TECHNIQUE: Multidetector CT imaging of the abdomen and pelvis was performed using the standard protocol following bolus administration of intravenous contrast.  CONTRAST:  50mL OMNIPAQUE IOHEXOL 300 MG/ML SOLN, OMNIPAQUE IOHEXOL 300 MG/ML SOLN  COMPARISON:  None.  FINDINGS: Liver and gallbladder are normal. Spleen is normal. Pancreas is normal. Adrenal glands are normal.  Numerous small bilateral nonobstructing renal calculi all measuring less than or equal to about 6 mm. Mild bilateral perinephric inflammatory changes likely chronic. No hydronephrosis. Bladder is normal.  Mild calcification of the abdominal aorta. No significant retroperitoneal adenopathy. Reproductive organs are normal. Fat containing inguinal hernias bilaterally.  Periumbilical hernia identified containing a loop of small bowel. Hernia neck is narrow at 2.5 cm and the hernia measures 2.5 x 5.9 cm. No evidence of obstruction or strangulation however.  Relatively mobile large bowel with cecum in the right mid abdomen to upper quadrant. Air-fluid levels proximal large bowel. Diverticulosis throughout the descending and sigmoid colon. Mild inflammatory change posterior to the proximal sigmoid colon. Additionally, there is an extraluminal collection of gas and debris measuring 61 x 66 mm immediately posterior and caudal to the proximal  sigmoid colon.  No free fluid.  No free air.  No acute musculoskeletal findings.  A mm right lower lobe pulmonary nodule. Atelectasis right lung base. Atelectasis left lung base. Mild cardiac enlargement.  IMPRESSION: 1. Diverticulitis proximal sigmoid colon. There is evidence of a focal contained perforation measuring 6.6 x 6.1 cm. 2. Narrow neck umbilical hernia contains loop of small bowel currently with no evidence of obstruction or strangulation. 3. Nonobstructing bilateral renal calculi 4. Bilateral lower lobe atelectasis. 8 mm pulmonary nodule right lower lobe. If the patient is at high risk for bronchogenic carcinoma, follow-up chest CT at 3-69months is recommended. If the patient is at low risk for bronchogenic carcinoma, follow-up chest CT at 6-12 months is recommended. This recommendation follows the consensus statement: Guidelines for Management of Small Pulmonary Nodules Detected on CT Scans: A Statement from the Fleischner Society as published in Radiology 2005; 237:395-400.   Electronically Signed   By: Esperanza Heir M.D.   On: 10/27/2014 20:18   Portable Chest Xray  10/28/2014   CLINICAL DATA:  Sepsis.  Initial encounter.  EXAM: PORTABLE CHEST - 1 VIEW  COMPARISON:  Chest radiograph performed 10/27/2014  FINDINGS: The patient's endotracheal tube is seen ending 3 cm above the carina. An enteric tube is noted extending below the diaphragm.  The lungs are hypoexpanded. Vascular crowding and vascular congestion are seen. Bilateral central airspace opacities raise concern for pulmonary edema, though pneumonia could have a similar appearance. A small left pleural effusion is suspected. No pneumothorax is seen.  The cardiomediastinal silhouette is borderline normal in size. No acute osseous abnormalities are identified.  IMPRESSION: 1. Endotracheal tube seen ending 3 cm above the carina. 2. Lungs hypoexpanded. Vascular congestion noted. Bilateral central airspace opacities raise concern for pulmonary  edema, though pneumonia could have a similar appearance. Small left pleural effusion suspected.   Electronically Signed   By: Roanna RaiderJeffery  Chang M.D.   On: 10/28/2014 04:29   Dg Chest Port 1 View  10/27/2014   CLINICAL DATA:  Mid abdominal pain.  EXAM: PORTABLE CHEST - 1 VIEW  COMPARISON:  None.  FINDINGS: Mild enlargement of the cardiopericardial silhouette with cephalization of blood flow. No definite Kerley B-lines. Indistinct airspace opacity along the left hemidiaphragm. Mild blunting of the left lateral costophrenic angle.  IMPRESSION: 1. Mild enlargement of the cardiopericardial silhouette with cephalization of blood flow favoring pulmonary venous hypertension. 2. Suspected trace left pleural effusion. Suspected atelectasis at the left lung base.   Electronically Signed   By: Herbie BaltimoreWalt  Liebkemann M.D.   On: 10/27/2014 19:23    EKG: Not obtained. CXR: Personally reviewed. Poor quality film. ? Edema  ASSESSMENT / PLAN:  Active Problems:   Diverticulitis of colon with perforation   PULMONARY A: Acute Hypoxic Resp Failure: ? Developing ARDS vs. restriction from abdomen. 8 mm RLL Nodule: P:   Lung protective ventilation Serial ABGs VAP prevention SBT/WUA when surgical plan clear Will need follow-up CTs as OP for nodule  CARDIOVASCULAR A: Shock: Almost certainly 2/2 perforation. S/p ~ 5.7 L fluid resuscitation.  HTN P:   Serial lactates Obtain EKG, serial trops Norepi to maintain MAP of 65 Follow-up cortisol  RENAL A: Hyponatremia: Likely 2/2 mild dehydration. P:   Serial BMPs  GASTROINTESTINAL A: Perforated Sigmoid: P:   Management per surgery  HEMATOLOGIC A: No acute issues  INFECTIOUS A: Intraabdominal abscess:  P:   Continue empiric Vanc/Zosyn/Mica Follow-up cultures  ENDOCRINE A: Hyperglycemia:  P:   SSI A1c  NEUROLOGIC A: No acute issues  BEST PRACTICE / DISPOSITION Level of Care:  ICU Primary Service:  PCCM Consultants:  Surgery Code Status:   Full Diet:  NPO DVT Px:  SQH GI Px:  PPI Skin Integrity:  Intact Social / Family:  Need to be updated  TODAY'S SUMMARY:   I have personally obtained a history, examined the patient, evaluated laboratory and imaging results, formulated the assessment and plan and placed orders.  CRITICAL CARE: The patient is critically ill with multiple organ systems failure and requires high complexity decision making for assessment and support, frequent evaluation and titration of therapies, application of advanced monitoring technologies and extensive interpretation of multiple databases. Critical Care Time devoted to patient care services described in this note is 45 minutes.   Evalyn CascoAdam Vianey Caniglia, MD Pulmonary and Critical Care Medicine [facility] Pager: [phone]   10/28/2014, 5:21 AM

## 2014-10-28 NOTE — Progress Notes (Signed)
2 RT's attempted to get arterial line without success. RN aware.

## 2014-10-28 NOTE — Progress Notes (Signed)
Utilization review completed.  

## 2014-10-28 NOTE — Progress Notes (Signed)
Day of Surgery  Subjective: He is on Vent but wakes easily had allot of discomfort with just removing wet to dry dressing.  He has allot of blood in his ostomy.    Objective: Vital signs in last 24 hours: Temp:  [97.7 F (36.5 C)-100.9 F (38.3 C)] 98.8 F (37.1 C) (01/11 0530) Pulse Rate:  [88-130] 88 (01/11 0730) Resp:  [12-27] 12 (01/11 0730) BP: (77-203)/(41-111) 120/57 mmHg (01/11 0730) SpO2:  [88 %-96 %] 96 % (01/11 0730) FiO2 (%):  [70 %-100 %] 70 % (01/11 0748) Weight:  [127.007 kg (280 lb)] 127.007 kg (280 lb) (01/10 2138) Last BM Date: 10/28/14 350 urine output recorded since surgery. BP down and on Pressors   WBC is still up. Intake/Output from previous day: 01/10 0701 - 01/11 0700 In: 8002 [I.V.:6262; IV Piggyback:1600] Out: 600 [Urine:350; Blood:250] Intake/Output this shift:    General appearance: Wakes up and is sensitive to pain/dressing change.  On Vent and pressors. Resp: clear to auscultation bilaterally and anterior Cardio: still a bit tachycardic GI: large abdomen.  no bowel sounds.  Open wound is clean some ooze type bleeding from wall of open wound.  Ostomy has allot of clotted blood in it.  Lab Results:   Recent Labs  10/27/14 1810 10/28/14 0555  WBC 15.5* 14.0*  HGB 16.9 13.8  HCT 50.9 42.4  PLT 187 176    BMET  Recent Labs  10/27/14 1810 10/28/14 0555  NA 134* 134*  K 4.0 4.8  CL 99 106  CO2 24 21  GLUCOSE 170* 150*  BUN 23 21  CREATININE 1.17 1.17  CALCIUM 9.4 7.4*   PT/INR No results for input(s): LABPROT, INR in the last 72 hours.   Recent Labs Lab 10/27/14 1810 10/28/14 0555  AST 29 26  ALT 24 23  ALKPHOS 53 33*  BILITOT 0.7 0.8  PROT 6.8 4.8*  ALBUMIN 4.6 2.9*     Lipase     Component Value Date/Time   LIPASE 23 10/27/2014 1810     Studies/Results: Ct Abdomen Pelvis W Contrast  10/27/2014   CLINICAL DATA:  Initial evaluation sudden onset left abdominal pain today, personal history of umbilical hernia  kidney stones hypertension, elevated white blood count  EXAM: CT ABDOMEN AND PELVIS WITH CONTRAST  TECHNIQUE: Multidetector CT imaging of the abdomen and pelvis was performed using the standard protocol following bolus administration of intravenous contrast.  CONTRAST:  50mL OMNIPAQUE IOHEXOL 300 MG/ML SOLN, OMNIPAQUE IOHEXOL 300 MG/ML SOLN  COMPARISON:  None.  FINDINGS: Liver and gallbladder are normal. Spleen is normal. Pancreas is normal. Adrenal glands are normal.  Numerous small bilateral nonobstructing renal calculi all measuring less than or equal to about 6 mm. Mild bilateral perinephric inflammatory changes likely chronic. No hydronephrosis. Bladder is normal.  Mild calcification of the abdominal aorta. No significant retroperitoneal adenopathy. Reproductive organs are normal. Fat containing inguinal hernias bilaterally.  Periumbilical hernia identified containing a loop of small bowel. Hernia neck is narrow at 2.5 cm and the hernia measures 2.5 x 5.9 cm. No evidence of obstruction or strangulation however.  Relatively mobile large bowel with cecum in the right mid abdomen to upper quadrant. Air-fluid levels proximal large bowel. Diverticulosis throughout the descending and sigmoid colon. Mild inflammatory change posterior to the proximal sigmoid colon. Additionally, there is an extraluminal collection of gas and debris measuring 61 x 66 mm immediately posterior and caudal to the proximal sigmoid colon.  No free fluid.  No free air.  No acute musculoskeletal findings.  A mm right lower lobe pulmonary nodule. Atelectasis right lung base. Atelectasis left lung base. Mild cardiac enlargement.  IMPRESSION: 1. Diverticulitis proximal sigmoid colon. There is evidence of a focal contained perforation measuring 6.6 x 6.1 cm. 2. Narrow neck umbilical hernia contains loop of small bowel currently with no evidence of obstruction or strangulation. 3. Nonobstructing bilateral renal calculi 4. Bilateral lower lobe  atelectasis. 8 mm pulmonary nodule right lower lobe. If the patient is at high risk for bronchogenic carcinoma, follow-up chest CT at 3-1046months is recommended. If the patient is at low risk for bronchogenic carcinoma, follow-up chest CT at 6-12 months is recommended. This recommendation follows the consensus statement: Guidelines for Management of Small Pulmonary Nodules Detected on CT Scans: A Statement from the Fleischner Society as published in Radiology 2005; 237:395-400.   Electronically Signed   By: Esperanza Heiraymond  Rubner M.D.   On: 10/27/2014 20:18   Dg Chest Port 1 View  10/28/2014   CLINICAL DATA:  Acute onset of respiratory failure. Subsequent encounter.  EXAM: PORTABLE CHEST - 1 VIEW  COMPARISON:  Chest radiograph performed earlier today at 3:58 a.m.  FINDINGS: The patient's endotracheal tube is seen ending 4 cm above the carina. An enteric tube is noted extending below the diaphragm.  Lung expansion is mildly improved. Vascular congestion is noted, with bibasilar airspace opacities, concerning for pulmonary edema. A small left pleural effusion is noted. No pneumothorax is seen.  The cardiomediastinal silhouette is borderline normal in size. No acute osseous abnormalities are identified.  IMPRESSION: 1. Endotracheal tube seen ending 4 cm above the carina. 2. Lung expansion mildly improved. Persistent relatively stable airspace opacities likely reflect pulmonary edema, though pneumonia might have a similar appearance. Small left pleural effusion noted.   Electronically Signed   By: Roanna RaiderJeffery  Chang M.D.   On: 10/28/2014 05:59   Portable Chest Xray  10/28/2014   CLINICAL DATA:  Sepsis.  Initial encounter.  EXAM: PORTABLE CHEST - 1 VIEW  COMPARISON:  Chest radiograph performed 10/27/2014  FINDINGS: The patient's endotracheal tube is seen ending 3 cm above the carina. An enteric tube is noted extending below the diaphragm.  The lungs are hypoexpanded. Vascular crowding and vascular congestion are seen. Bilateral  central airspace opacities raise concern for pulmonary edema, though pneumonia could have a similar appearance. A small left pleural effusion is suspected. No pneumothorax is seen.  The cardiomediastinal silhouette is borderline normal in size. No acute osseous abnormalities are identified.  IMPRESSION: 1. Endotracheal tube seen ending 3 cm above the carina. 2. Lungs hypoexpanded. Vascular congestion noted. Bilateral central airspace opacities raise concern for pulmonary edema, though pneumonia could have a similar appearance. Small left pleural effusion suspected.   Electronically Signed   By: Roanna RaiderJeffery  Chang M.D.   On: 10/28/2014 04:29   Dg Chest Port 1 View  10/27/2014   CLINICAL DATA:  Mid abdominal pain.  EXAM: PORTABLE CHEST - 1 VIEW  COMPARISON:  None.  FINDINGS: Mild enlargement of the cardiopericardial silhouette with cephalization of blood flow. No definite Kerley B-lines. Indistinct airspace opacity along the left hemidiaphragm. Mild blunting of the left lateral costophrenic angle.  IMPRESSION: 1. Mild enlargement of the cardiopericardial silhouette with cephalization of blood flow favoring pulmonary venous hypertension. 2. Suspected trace left pleural effusion. Suspected atelectasis at the left lung base.   Electronically Signed   By: Herbie BaltimoreWalt  Liebkemann M.D.   On: 10/27/2014 19:23    Medications: . antiseptic oral rinse  7 mL  Mouth Rinse QID  . chlorhexidine  15 mL Mouth Rinse BID  . heparin  5,000 Units Subcutaneous 3 times per day  . levothyroxine  50 mcg Intravenous Daily  . micafungin [facility]) IV  100 mg Intravenous Q0600  . pantoprazole (PROTONIX) IV  40 mg Intravenous QHS  . piperacillin-tazobactam (ZOSYN)  IV  3.375 g Intravenous 3 times per day  . propofol      . vancomycin  1,000 mg Intravenous Q12H   . fentaNYL infusion INTRAVENOUS 75 mcg/hr (10/28/14 0700)  . norepinephrine (LEVOPHED) Adult infusion 12 mcg/min (10/28/14 0700)  . propofol 40 mcg/kg/min (10/28/14 0700)    Prior to Admission medications   Medication Sig Start Date End Date Taking? Authorizing Provider  cholecalciferol (VITAMIN D) 1000 UNITS tablet Take 1,000 Units by mouth daily.   Yes Historical Provider, MD  levothyroxine (SYNTHROID, LEVOTHROID) 150 MCG tablet Take 150 mcg by mouth daily before breakfast.   Yes Historical Provider, MD  lisinopril (PRINIVIL,ZESTRIL) 10 MG tablet Take 10 mg by mouth daily.   Yes Historical Provider, MD  meloxicam (MOBIC) 15 MG tablet Take 15 mg by mouth daily.   Yes Historical Provider, MD     Assessment/Plan 1.  Sigmoid diverticulitis, Perforated Bowel EXPLORATORY LAPAROTOMY, PARTIAL COLECTOMY Sigmoid, COLOSTOMY,   Repair umbilical hernia, 10/28/2014, [name], MD.  2.  Sepsis with hypotension 3.  Hypertension  4.  Hx of nephrolithiasis   5.  Hypothyroid TSH 16 6.  Hx of tobacco use 30 years (quit 9 years ago) 7.  Body mass index is 38.51 kg  8.  Heparin/SCD for DVT  Plan:  Watch his H/H,  CCM is continuing critical care.  Wound ostomy care.  Wet to dry dressing to his abdomen.      LOS: 1 day    Gullett,Shaylin Blatt 10/28/2014 \

## 2014-10-28 NOTE — Op Note (Addendum)
10/27/2014 - 10/28/2014  3:00 AM  PATIENT:  William [name]  64 y.o. male  PRE-OPERATIVE DIAGNOSIS:  Sigmoid diverticulitis, Perforated Bowel  POST-OPERATIVE DIAGNOSIS:  same  PROCEDURE:  Procedure(s): PARTIAL COLECTOMY Sigmoid (N/A) COLOSTOMY (N/A) EXPLORATORY LAPAROTOMY (N/A)  Repair umbilical hernia  SURGEON:  Surgeon(s) and Role:    * Griselda Miner, MD - Primary  PHYSICIAN ASSISTANT:   ASSISTANTS: Dr. Derrell Lolling   ANESTHESIA:   general  EBL:  Total I/O In: 6000 [I.V.:5000; IV Piggyback:1000] Out: 600 [Urine:350; Blood:250]  BLOOD ADMINISTERED:none  DRAINS: none   LOCAL MEDICATIONS USED:  NONE  SPECIMEN:  Source of Specimen:  sigmoid colon  DISPOSITION OF SPECIMEN:  PATHOLOGY  COUNTS:  YES  TOURNIQUET:  * No tourniquets in log *  DICTATION: .Dragon Dictation  After informed consent was obtained the patient was brought to the operating room and placed in the supine position on the operating room table. After adequate induction of general anesthesia the patient's abdomen was prepped with ChloraPrep, allowed to dry, and draped in usual sterile manner. A midline incision was made with a 15 by knife. This incision was carried through the skin and subcutaneous tissue sharply with electrocautery until the linea alba was identified. The linea alba was also incised with electrocautery. The preperitoneal space was probed bluntly with a hemostat until the peritoneum was opened and neck systems was gained to the abdominal cavity. The rest of the incision was opened under direct vision. The patient had a moderate-sized umbilical hernia that we opened through. There was nothing incarcerated in the umbilical hernia. Upon entering the abdomen we encountered a large amount of stool. There was a large hole in the sigmoid colon. A site was chosen above the area of perforation where the colon appeared normal and the mesentery at this point was opened sharply with the electrocautery. A GI 75  stapler was placed across the colon at this point clamped and fired thereby dividing the colon between staple lines. A site was also chosen below the area of perforation where the colon appeared normal. The mesentery at this point was opened sharply with the electrocautery. A GIA-75 stapler was placed across the colon at this point clamped and fired thereby dividing the colon between staple lines. The mesentery to the affected portion of sigmoid colon was taken down sharply with the LigaSure. Several small vessels had to be controlled with 2-0 silk figure-of-eight stitches. Once this was accomplished the sigmoid colon was removed. The left colon was then mobilized by incising its retroperitoneal attachment along the white line of Toldt. Once this was accomplished it appeared as though we had enough length to bring the colostomy out. A site was chosen on the left lower quadrant abdominal wall for placement of the ostomy. A circular portion of skin and subcutaneous fat was excised sharply with the electrocautery and 15 blade knife. A cruciate incision was made in the fascia of the abdominal wall so that 3 fingers could be brought through. A Babcock was placed through this opening and used to bring the left colon through the abdominal wall. The abdomen was then irrigated with several liters of saline until the effluent was clear. The rectal stump was marked with 2 2-0 Prolene stitches. The NG tube was confirmed in the stomach. Next the fascia of the anterior abdominal wall was closed with 2 #1 running PDS looped sutures. The hernia sac of the umbilical hernia was excised sharply prior to closure with the electrocautery. Next the staple line of  the ostomy was excised sharply with the electrocautery. The ostomy was matured with interrupted 3-0 Vicryl stitches. The ostomy appeared pink and viable. An ostomy bag was applied and the midline wound was packed with moistened Kerlix gauze. Sterile dressings were applied. The  patient tolerated the procedure well. At the end of the case all needle sponge and instrument counts were correct. The patient will be left intubated and taken to the ICU for further resuscitation measures.  PLAN OF CARE: Admit to inpatient   PATIENT DISPOSITION:  ICU - intubated and critically ill.   Delay start of Pharmacological VTE agent (>24hrs) due to surgical blood loss or risk of bleeding: yes

## 2014-10-28 NOTE — Transfer of Care (Signed)
Immediate Anesthesia Transfer of Care Note  Patient: William [name]  Procedure(s) Performed: Procedure(s): PARTIAL COLECTOMY Sigmoid (N/A) COLOSTOMY (N/A) EXPLORATORY LAPAROTOMY (N/A)  Patient Location: ICU  Anesthesia Type:General  Level of Consciousness: unresponsive and Patient remains intubated per anesthesia plan  Airway & Oxygen Therapy: Patient remains intubated per anesthesia plan and Patient placed on Ventilator (see vital sign flow sheet for setting)  Post-op Assessment: Post -op Vital signs reviewed and stable  Post vital signs: Reviewed and stable  Complications: No apparent anesthesia complications

## 2014-10-29 ENCOUNTER — Encounter (HOSPITAL_COMMUNITY): Payer: Self-pay | Admitting: General Surgery

## 2014-10-29 ENCOUNTER — Inpatient Hospital Stay (HOSPITAL_COMMUNITY): Payer: 59

## 2014-10-29 DIAGNOSIS — J9601 Acute respiratory failure with hypoxia: Secondary | ICD-10-CM

## 2014-10-29 LAB — CBC WITH DIFFERENTIAL/PLATELET
Basophils Absolute: 0 10*3/uL (ref 0.0–0.1)
Basophils Relative: 0 % (ref 0–1)
EOS PCT: 0 % (ref 0–5)
Eosinophils Absolute: 0 10*3/uL (ref 0.0–0.7)
HCT: 41.5 % (ref 39.0–52.0)
Hemoglobin: 13.3 g/dL (ref 13.0–17.0)
LYMPHS ABS: 1.1 10*3/uL (ref 0.7–4.0)
Lymphocytes Relative: 9 % — ABNORMAL LOW (ref 12–46)
MCH: 32 pg (ref 26.0–34.0)
MCHC: 32 g/dL (ref 30.0–36.0)
MCV: 99.8 fL (ref 78.0–100.0)
Monocytes Absolute: 0.6 10*3/uL (ref 0.1–1.0)
Monocytes Relative: 5 % (ref 3–12)
NEUTROS ABS: 11 10*3/uL — AB (ref 1.7–7.7)
Neutrophils Relative %: 86 % — ABNORMAL HIGH (ref 43–77)
Platelets: 167 10*3/uL (ref 150–400)
RBC: 4.16 MIL/uL — ABNORMAL LOW (ref 4.22–5.81)
RDW: 15 % (ref 11.5–15.5)
WBC: 12.7 10*3/uL — ABNORMAL HIGH (ref 4.0–10.5)

## 2014-10-29 LAB — COMPREHENSIVE METABOLIC PANEL
ALBUMIN: 2.8 g/dL — AB (ref 3.5–5.2)
ALT: 21 U/L (ref 0–53)
ANION GAP: 9 (ref 5–15)
AST: 19 U/L (ref 0–37)
Alkaline Phosphatase: 36 U/L — ABNORMAL LOW (ref 39–117)
BILIRUBIN TOTAL: 0.9 mg/dL (ref 0.3–1.2)
BUN: 19 mg/dL (ref 6–23)
CALCIUM: 7.7 mg/dL — AB (ref 8.4–10.5)
CHLORIDE: 104 meq/L (ref 96–112)
CO2: 28 mmol/L (ref 19–32)
CREATININE: 1.03 mg/dL (ref 0.50–1.35)
GFR calc Af Amer: 89 mL/min — ABNORMAL LOW (ref >90)
GFR calc non Af Amer: 76 mL/min — ABNORMAL LOW (ref >90)
GLUCOSE: 138 mg/dL — AB (ref 70–99)
POTASSIUM: 6.1 mmol/L — AB (ref 3.5–5.1)
Sodium: 141 mmol/L (ref 135–145)
Total Protein: 5.4 g/dL — ABNORMAL LOW (ref 6.0–8.3)

## 2014-10-29 LAB — URINE CULTURE
Colony Count: NO GROWTH
Culture: NO GROWTH

## 2014-10-29 LAB — PHOSPHORUS: Phosphorus: 2.6 mg/dL (ref 2.3–4.6)

## 2014-10-29 LAB — MAGNESIUM: Magnesium: 2.1 mg/dL (ref 1.5–2.5)

## 2014-10-29 LAB — LACTIC ACID, PLASMA: Lactic Acid, Venous: 1.5 mmol/L (ref 0.5–2.2)

## 2014-10-29 LAB — TROPONIN I: Troponin I: <0.03 ng/mL (ref <0.031)

## 2014-10-29 LAB — PROTIME-INR
INR: 1.23 (ref 0.00–1.49)
PROTHROMBIN TIME: 15.7 s — AB (ref 11.6–15.2)

## 2014-10-29 LAB — POTASSIUM: POTASSIUM: 3.9 mmol/L (ref 3.5–5.1)

## 2014-10-29 MED ORDER — ACETAMINOPHEN 650 MG RE SUPP
650.0000 mg | Freq: Four times a day (QID) | RECTAL | Status: DC | PRN
  Administered 2014-10-29 – 2014-11-08 (×2): 650 mg via RECTAL
  Filled 2014-10-29 (×3): qty 1

## 2014-10-29 MED ORDER — LEVOTHYROXINE SODIUM 100 MCG IV SOLR
75.0000 ug | Freq: Every day | INTRAVENOUS | Status: DC
  Administered 2014-10-29 – 2014-11-04 (×7): 75 ug via INTRAVENOUS
  Administered 2014-11-05: 100 ug via INTRAVENOUS
  Administered 2014-11-06 – 2014-11-10 (×5): 75 ug via INTRAVENOUS
  Filled 2014-10-29 (×13): qty 5

## 2014-10-29 MED ORDER — ONDANSETRON HCL 4 MG/2ML IJ SOLN: 4.0000 mg | Freq: Four times a day (QID) | INTRAMUSCULAR | Status: DC | PRN

## 2014-10-29 MED ORDER — PROMETHAZINE HCL 25 MG/ML IJ SOLN: 6.2500 mg | INTRAMUSCULAR | Status: DC | PRN

## 2014-10-29 MED ORDER — DIPHENHYDRAMINE HCL 50 MG/ML IJ SOLN: 12.5000 mg | Freq: Four times a day (QID) | INTRAMUSCULAR | Status: DC | PRN

## 2014-10-29 MED ORDER — LIP MEDEX EX OINT
1.0000 | TOPICAL_OINTMENT | Freq: Two times a day (BID) | CUTANEOUS | Status: DC
Start: 2014-10-29 — End: 2014-11-21
  Administered 2014-10-29 – 2014-11-21 (×47): 1 via TOPICAL
  Filled 2014-10-29 (×6): qty 7

## 2014-10-29 MED ORDER — ALUM & MAG HYDROXIDE-SIMETH 200-200-20 MG/5ML PO SUSP: 30.0000 mL | Freq: Four times a day (QID) | ORAL | Status: DC | PRN

## 2014-10-29 MED ORDER — MAGIC MOUTHWASH: 15.0000 mL | Freq: Four times a day (QID) | ORAL | Status: DC | PRN

## 2014-10-29 MED ORDER — ONDANSETRON 8 MG/NS 50 ML IVPB
8.0000 mg | Freq: Four times a day (QID) | INTRAVENOUS | Status: DC | PRN
  Filled 2014-10-29: qty 8

## 2014-10-29 MED ORDER — MENTHOL 3 MG MT LOZG: 1.0000 | LOZENGE | OROMUCOSAL | Status: DC | PRN

## 2014-10-29 MED ORDER — PHENOL 1.4 % MT LIQD: 2.0000 | OROMUCOSAL | Status: DC | PRN

## 2014-10-29 MED ORDER — LACTATED RINGERS IV BOLUS (SEPSIS): 1000.0000 mL | Freq: Three times a day (TID) | INTRAVENOUS | Status: DC | PRN

## 2014-10-29 NOTE — Progress Notes (Signed)
[facility]  [address] [place], [place] [address] Phone: [phone] FAX: [phone]    Brylon Brenning [id number] [birth year]  CARE TEAM:  PCP: No primary care provider on file.  Outpatient Care Team: No care team member to display  Inpatient Treatment Team: Treatment Team: Attending Provider: Md Edison Pace, MD; Technician: Kathlen Brunswick, NT; Attending Physician: Md Edison Pace, MD; Consulting Physician: Md Pccm, MD; Registered Nurse: Sheron Nightingale, RN; Registered Nurse: Carl Best, RN   Subjective:  Weaning vent/pressors a little Wife in room  Objective:  Vital signs:  Filed Vitals:   10/29/14 0327 10/29/14 0350 10/29/14 0400 10/29/14 0500  BP: 112/53   94/49  Pulse: 90   82  Temp:  98.6 F (37 C)    TempSrc:  Oral    Resp: 16   14  Height:      Weight:   299 lb 12.8 oz (135.988 kg)   SpO2: 95%   95%    Last BM Date: 10/28/14  Intake/Output   Yesterday:  01/11 0701 - 01/12 0700 In: 2503 [I.V.:1930.5; NG/GT:60; IV Piggyback:512.5] Out: 9150 [Urine:3150; Emesis/NG output:1000; Stool:125] This shift:     Bowel function:  Flatus: n  BM: n  Drain: NGT thinner feculent/bilious  Physical Exam:  General: Pt intubated/sedated.  Does wake up & occ follow commands Eyes: PERRL, normal EOM.  Sclera clear.  No icterus Neuro: CN II-XII intact w/o focal sensory/motor deficits. Lymph: No head/neck/groin lymphadenopathy Psych:  No delerium/psychosis/paranoia HENT: Normocephalic, Mucus membranes moist.  No thrush.  OGT ETT in place Neck: Supple, No tracheal deviation Chest:  Course BS Bilaterally. No chest wall pain w good excursion CV:  Pulses intact.  Regular rhythm MS: Normal AROM mjr joints.  No obvious deformity Abdomen: Mod distended.  Obese.  Wound vac in place.  Colostomy purple dusky but stable  No incarcerated hernias. Ext:  SCDs BLE.  2+ edema.  No cyanosis Skin: No petechiae / purpura   Problem  List:   Active Problems:   Diverticulitis of colon with perforation   Shock circulatory   Acute respiratory failure with hypoxia   Acute post-operative pain   Solitary pulmonary nodule on lung CT   Sleep apnea   Assessment  Darliss Ridgel  64 y.o. male  1 Day Post-Op  Procedure(s): PARTIAL COLECTOMY Sigmoid COLOSTOMY EXPLORATORY LAPAROTOMY  Guarded but slightly improved  Plan:  -vent support - wean will be slow with ARDS & morbid obesity/distention -levophed - wean gradually Volume as needed - already +5.6L Remove K from IVF/etc - follow IV ABx - Zosyn at least.  Vanco per ICU F/u pathology Ostomy care Wound care -VTE prophylaxis- SCDs, etc -mobilize as tolerated to help recovery  I updated the status of the patient to the patient's wife & ICU RN.  I made recommendations.  I answered questions.  Understanding & appreciation was expressed.   Adin Hector, M.D., F.A.C.S. Gastrointestinal and Minimally Invasive Surgery [facility] [address] [place],  [address] [phone] Main / Paging   10/29/2014   Results:   Labs: Results for orders placed or performed during the hospital encounter of 10/27/14 (from the past 48 hour(s))  CBC with Differential     Status: Abnormal   Collection Time: 10/27/14  6:10 PM  Result Value Ref Range   WBC 15.5 (H) 4.0 - 10.5 K/uL   RBC 5.26 4.22 - 5.81 MIL/uL   Hemoglobin 16.9 13.0 -  17.0 g/dL   HCT 50.9 39.0 - 52.0 %   MCV 96.8 78.0 - 100.0 fL   MCH 32.1 26.0 - 34.0 pg   MCHC 33.2 30.0 - 36.0 g/dL   RDW 14.5 11.5 - 15.5 %   Platelets 187 150 - 400 K/uL   Neutrophils Relative % 89 (H) 43 - 77 %   Neutro Abs 13.8 (H) 1.7 - 7.7 K/uL   Lymphocytes Relative 4 (L) 12 - 46 %   Lymphs Abs 0.6 (L) 0.7 - 4.0 K/uL   Monocytes Relative 7 3 - 12 %   Monocytes Absolute 1.1 (H) 0.1 - 1.0 K/uL   Eosinophils Relative 0 0 - 5 %   Eosinophils Absolute 0.0 0.0 - 0.7 K/uL   Basophils Relative  0 0 - 1 %   Basophils Absolute 0.0 0.0 - 0.1 K/uL  Comprehensive metabolic panel     Status: Abnormal   Collection Time: 10/27/14  6:10 PM  Result Value Ref Range   Sodium 134 (L) 135 - 145 mmol/L    Comment: Please note change in reference range.   Potassium 4.0 3.5 - 5.1 mmol/L    Comment: Please note change in reference range.   Chloride 99 96 - 112 mEq/L   CO2 24 19 - 32 mmol/L   Glucose, Bld 170 (H) 70 - 99 mg/dL   BUN 23 6 - 23 mg/dL   Creatinine, Ser 1.17 0.50 - 1.35 mg/dL   Calcium 9.4 8.4 - 10.5 mg/dL   Total Protein 6.8 6.0 - 8.3 g/dL   Albumin 4.6 3.5 - 5.2 g/dL   AST 29 0 - 37 U/L   ALT 24 0 - 53 U/L   Alkaline Phosphatase 53 39 - 117 U/L   Total Bilirubin 0.7 0.3 - 1.2 mg/dL   GFR calc non Af Amer 66 (L) >90 mL/min   GFR calc Af Amer 76 (L) >90 mL/min    Comment: (NOTE) The eGFR has been calculated using the CKD EPI equation. This calculation has not been validated in all clinical situations. eGFR's persistently <90 mL/min signify possible Chronic Kidney Disease.    Anion gap 11 5 - 15  Lipase, blood     Status: None   Collection Time: 10/27/14  6:10 PM  Result Value Ref Range   Lipase 23 11 - 59 U/L  I-Stat CG4 Lactic Acid, ED     Status: Abnormal   Collection Time: 10/27/14  6:30 PM  Result Value Ref Range   Lactic Acid, Venous 4.76 (H) 0.5 - 2.2 mmol/L  I-Stat CG4 Lactic Acid, ED     Status: Abnormal   Collection Time: 10/27/14  7:24 PM  Result Value Ref Range   Lactic Acid, Venous 3.21 (H) 0.5 - 2.2 mmol/L  Urinalysis, Routine w reflex microscopic     Status: Abnormal   Collection Time: 10/27/14  8:46 PM  Result Value Ref Range   Color, Urine YELLOW YELLOW   APPearance CLEAR CLEAR   Specific Gravity, Urine 1.044 (H) 1.005 - 1.030   pH 5.5 5.0 - 8.0   Glucose, UA NEGATIVE NEGATIVE mg/dL   Hgb urine dipstick NEGATIVE NEGATIVE   Bilirubin Urine NEGATIVE NEGATIVE   Ketones, ur NEGATIVE NEGATIVE mg/dL   Protein, ur NEGATIVE NEGATIVE mg/dL    Urobilinogen, UA 0.2 0.0 - 1.0 mg/dL   Nitrite NEGATIVE NEGATIVE   Leukocytes, UA NEGATIVE NEGATIVE    Comment: MICROSCOPIC NOT DONE ON URINES WITH NEGATIVE PROTEIN, BLOOD, LEUKOCYTES, NITRITE, OR GLUCOSE <  1000 mg/dL.  Lactic acid, plasma     Status: Abnormal   Collection Time: 10/28/14  5:54 AM  Result Value Ref Range   Lactic Acid, Venous 2.4 (H) 0.5 - 2.2 mmol/L  CBC     Status: Abnormal   Collection Time: 10/28/14  5:55 AM  Result Value Ref Range   WBC 14.0 (H) 4.0 - 10.5 K/uL    Comment: WHITE COUNT CONFIRMED ON SMEAR   RBC 4.33 4.22 - 5.81 MIL/uL   Hemoglobin 13.8 13.0 - 17.0 g/dL   HCT 42.4 39.0 - 52.0 %   MCV 97.9 78.0 - 100.0 fL   MCH 31.9 26.0 - 34.0 pg   MCHC 32.5 30.0 - 36.0 g/dL   RDW 14.5 11.5 - 15.5 %   Platelets 176 150 - 400 K/uL  Cortisol     Status: None   Collection Time: 10/28/14  5:55 AM  Result Value Ref Range   Cortisol, Plasma 28.8 ug/dL    Comment: (NOTE) AM:  4.3 - 22.4 ug/dL PM:  3.1 - 16.7 ug/dL Performed at [facility]   TSH     Status: Abnormal   Collection Time: 10/28/14  5:55 AM  Result Value Ref Range   TSH 16.108 (H) 0.350 - 4.500 uIU/mL    Comment: Performed at [facility]  Triglycerides     Status: None   Collection Time: 10/28/14  5:55 AM  Result Value Ref Range   Triglycerides 109 <150 mg/dL    Comment: Performed at [facility]  Comprehensive metabolic panel     Status: Abnormal   Collection Time: 10/28/14  5:55 AM  Result Value Ref Range   Sodium 134 (L) 135 - 145 mmol/L    Comment: Please note change in reference range.   Potassium 4.8 3.5 - 5.1 mmol/L    Comment: Please note change in reference range.   Chloride 106 96 - 112 mEq/L   CO2 21 19 - 32 mmol/L   Glucose, Bld 150 (H) 70 - 99 mg/dL   BUN 21 6 - 23 mg/dL   Creatinine, Ser 1.17 0.50 - 1.35 mg/dL   Calcium 7.4 (L) 8.4 - 10.5 mg/dL   Total Protein 4.8 (L) 6.0 - 8.3 g/dL   Albumin 2.9 (L) 3.5 - 5.2 g/dL   AST 26 0 - 37 U/L   ALT 23 0 - 53  U/L   Alkaline Phosphatase 33 (L) 39 - 117 U/L   Total Bilirubin 0.8 0.3 - 1.2 mg/dL   GFR calc non Af Amer 66 (L) >90 mL/min   GFR calc Af Amer 76 (L) >90 mL/min    Comment: (NOTE) The eGFR has been calculated using the CKD EPI equation. This calculation has not been validated in all clinical situations. eGFR's persistently <90 mL/min signify possible Chronic Kidney Disease.    Anion gap 7 5 - 15  Troponin I (q 6hr x 3)     Status: None   Collection Time: 10/28/14  5:55 AM  Result Value Ref Range   Troponin I <0.03 <0.031 ng/mL    Comment:        NO INDICATION OF MYOCARDIAL INJURY. Please note change in reference range.   Draw ABG 1 hour after initiation of ventilator     Status: Abnormal   Collection Time: 10/28/14  7:04 AM  Result Value Ref Range   FIO2 1.00 %   Delivery systems VENTILATOR    Mode PRESSURE REGULATED VOLUME CONTROL  VT 610 mL   Rate 14 resp/min   Peep/cpap 12.0 cm H20   pH, Arterial 7.317 (L) 7.350 - 7.450   pCO2 arterial 42.4 35.0 - 45.0 mmHg   pO2, Arterial 99.1 80.0 - 100.0 mmHg   Bicarbonate 21.0 20.0 - 24.0 mEq/L   TCO2 18.8 0 - 100 mmol/L   Acid-base deficit 4.4 (H) 0.0 - 2.0 mmol/L   O2 Saturation 96.8 %   Patient temperature 98.8    Collection site RIGHT RADIAL    Drawn by [phone]    Sample type ARTERIAL DRAW    Allens test (pass/fail) PASS PASS  MRSA PCR Screening     Status: None   Collection Time: 10/28/14  8:55 AM  Result Value Ref Range   MRSA by PCR NEGATIVE NEGATIVE    Comment:        The GeneXpert MRSA Assay (FDA approved for NASAL specimens only), is one component of a comprehensive MRSA colonization surveillance program. It is not intended to diagnose MRSA infection nor to guide or monitor treatment for MRSA infections.   Lactic acid, plasma     Status: Abnormal   Collection Time: 10/28/14 12:30 PM  Result Value Ref Range   Lactic Acid, Venous 2.4 (H) 0.5 - 2.2 mmol/L  Troponin I (q 6hr x 3)     Status: None    Collection Time: 10/28/14 12:30 PM  Result Value Ref Range   Troponin I <0.03 <0.031 ng/mL    Comment:        NO INDICATION OF MYOCARDIAL INJURY. Please note change in reference range.   CBC     Status: Abnormal   Collection Time: 10/28/14 12:30 PM  Result Value Ref Range   WBC 16.5 (H) 4.0 - 10.5 K/uL   RBC 4.39 4.22 - 5.81 MIL/uL   Hemoglobin 14.1 13.0 - 17.0 g/dL   HCT 43.3 39.0 - 52.0 %   MCV 98.6 78.0 - 100.0 fL   MCH 32.1 26.0 - 34.0 pg   MCHC 32.6 30.0 - 36.0 g/dL   RDW 14.6 11.5 - 15.5 %   Platelets 192 150 - 400 K/uL  Lactic acid, plasma     Status: None   Collection Time: 10/28/14  6:00 PM  Result Value Ref Range   Lactic Acid, Venous 1.9 0.5 - 2.2 mmol/L  Troponin I (q 6hr x 3)     Status: None   Collection Time: 10/28/14  6:00 PM  Result Value Ref Range   Troponin I <0.03 <0.031 ng/mL    Comment:        NO INDICATION OF MYOCARDIAL INJURY. Please note change in reference range.   CBC     Status: Abnormal   Collection Time: 10/28/14  6:00 PM  Result Value Ref Range   WBC 16.3 (H) 4.0 - 10.5 K/uL   RBC 4.28 4.22 - 5.81 MIL/uL   Hemoglobin 13.8 13.0 - 17.0 g/dL   HCT 42.5 39.0 - 52.0 %   MCV 99.3 78.0 - 100.0 fL   MCH 32.2 26.0 - 34.0 pg   MCHC 32.5 30.0 - 36.0 g/dL   RDW 14.9 11.5 - 15.5 %   Platelets 199 150 - 400 K/uL  CBC     Status: Abnormal   Collection Time: 10/28/14 11:27 PM  Result Value Ref Range   WBC 13.3 (H) 4.0 - 10.5 K/uL   RBC 4.09 (L) 4.22 - 5.81 MIL/uL   Hemoglobin 13.3 13.0 - 17.0 g/dL   HCT 40.8  39.0 - 52.0 %   MCV 99.8 78.0 - 100.0 fL   MCH 32.5 26.0 - 34.0 pg   MCHC 32.6 30.0 - 36.0 g/dL   RDW 15.1 11.5 - 15.5 %   Platelets 186 150 - 400 K/uL  Comprehensive metabolic panel     Status: Abnormal (Preliminary result)   Collection Time: 10/29/14  5:50 AM  Result Value Ref Range   Sodium PENDING 135 - 145 mmol/L   Potassium 6.1 (HH) 3.5 - 5.1 mmol/L    Comment: REPEATED TO VERIFY DELTA CHECK NOTED NO VISIBLE HEMOLYSIS CRITICAL  RESULT CALLED TO, READ BACK BY AND VERIFIED WITH: T.BANKS RN AT [phone] ON 01.12.16 Please note change in reference range.    Chloride 104 96 - 112 mEq/L   CO2 28 19 - 32 mmol/L   Glucose, Bld 138 (H) 70 - 99 mg/dL   BUN 19 6 - 23 mg/dL   Creatinine, Ser 1.03 0.50 - 1.35 mg/dL   Calcium 7.7 (L) 8.4 - 10.5 mg/dL   Total Protein 5.4 (L) 6.0 - 8.3 g/dL   Albumin 2.8 (L) 3.5 - 5.2 g/dL   AST 19 0 - 37 U/L   ALT 21 0 - 53 U/L   Alkaline Phosphatase 36 (L) 39 - 117 U/L   Total Bilirubin 0.9 0.3 - 1.2 mg/dL   GFR calc non Af Amer 76 (L) >90 mL/min   GFR calc Af Amer 89 (L) >90 mL/min    Comment: (NOTE) The eGFR has been calculated using the CKD EPI equation. This calculation has not been validated in all clinical situations. eGFR's persistently <90 mL/min signify possible Chronic Kidney Disease.    Anion gap PENDING 5 - 15  CBC with Differential     Status: Abnormal (Preliminary result)   Collection Time: 10/29/14  5:50 AM  Result Value Ref Range   WBC 12.7 (H) 4.0 - 10.5 K/uL   RBC 4.16 (L) 4.22 - 5.81 MIL/uL   Hemoglobin 13.3 13.0 - 17.0 g/dL   HCT 41.5 39.0 - 52.0 %   MCV 99.8 78.0 - 100.0 fL   MCH 32.0 26.0 - 34.0 pg   MCHC 32.0 30.0 - 36.0 g/dL   RDW 15.0 11.5 - 15.5 %   Platelets 167 150 - 400 K/uL   Neutrophils Relative % PENDING 43 - 77 %   Neutro Abs PENDING 1.7 - 7.7 K/uL   Band Neutrophils PENDING 0 - 10 %   Lymphocytes Relative PENDING 12 - 46 %   Lymphs Abs PENDING 0.7 - 4.0 K/uL   Monocytes Relative PENDING 3 - 12 %   Monocytes Absolute PENDING 0.1 - 1.0 K/uL   Eosinophils Relative PENDING 0 - 5 %   Eosinophils Absolute PENDING 0.0 - 0.7 K/uL   Basophils Relative PENDING 0 - 1 %   Basophils Absolute PENDING 0.0 - 0.1 K/uL   WBC Morphology PENDING    RBC Morphology PENDING    Smear Review PENDING    nRBC PENDING 0 /100 WBC   Metamyelocytes Relative PENDING %   Myelocytes PENDING %   Promyelocytes Absolute PENDING %   Blasts PENDING %  Phosphorus     Status:  None   Collection Time: 10/29/14  5:50 AM  Result Value Ref Range   Phosphorus 2.6 2.3 - 4.6 mg/dL  Magnesium     Status: None   Collection Time: 10/29/14  5:50 AM  Result Value Ref Range   Magnesium 2.1 1.5 - 2.5 mg/dL  Troponin  I     Status: None   Collection Time: 10/29/14  5:50 AM  Result Value Ref Range   Troponin I <0.03 <0.031 ng/mL    Comment:        NO INDICATION OF MYOCARDIAL INJURY. Please note change in reference range.   Lactic acid, plasma     Status: None   Collection Time: 10/29/14  5:50 AM  Result Value Ref Range   Lactic Acid, Venous 1.5 0.5 - 2.2 mmol/L  Protime-INR     Status: Abnormal   Collection Time: 10/29/14  5:50 AM  Result Value Ref Range   Prothrombin Time 15.7 (H) 11.6 - 15.2 seconds   INR 1.23 0.00 - 1.49    Imaging / Studies: Ct Abdomen Pelvis W Contrast  10/27/2014   CLINICAL DATA:  Initial evaluation sudden onset left abdominal pain today, personal history of umbilical hernia kidney stones hypertension, elevated white blood count  EXAM: CT ABDOMEN AND PELVIS WITH CONTRAST  TECHNIQUE: Multidetector CT imaging of the abdomen and pelvis was performed using the standard protocol following bolus administration of intravenous contrast.  CONTRAST:  70m OMNIPAQUE IOHEXOL 300 MG/ML SOLN, 103mOMNIPAQUE IOHEXOL 300 MG/ML SOLN  COMPARISON:  None.  FINDINGS: Liver and gallbladder are normal. Spleen is normal. Pancreas is normal. Adrenal glands are normal.  Numerous small bilateral nonobstructing renal calculi all measuring less than or equal to about 6 mm. Mild bilateral perinephric inflammatory changes likely chronic. No hydronephrosis. Bladder is normal.  Mild calcification of the abdominal aorta. No significant retroperitoneal adenopathy. Reproductive organs are normal. Fat containing inguinal hernias bilaterally.  Periumbilical hernia identified containing a loop of small bowel. Hernia neck is narrow at 2.5 cm and the hernia measures 2.5 x 5.9 cm. No evidence  of obstruction or strangulation however.  Relatively mobile large bowel with cecum in the right mid abdomen to upper quadrant. Air-fluid levels proximal large bowel. Diverticulosis throughout the descending and sigmoid colon. Mild inflammatory change posterior to the proximal sigmoid colon. Additionally, there is an extraluminal collection of gas and debris measuring 61 x 66 mm immediately posterior and caudal to the proximal sigmoid colon.  No free fluid.  No free air.  No acute musculoskeletal findings.  A mm right lower lobe pulmonary nodule. Atelectasis right lung base. Atelectasis left lung base. Mild cardiac enlargement.  IMPRESSION: 1. Diverticulitis proximal sigmoid colon. There is evidence of a focal contained perforation measuring 6.6 x 6.1 cm. 2. Narrow neck umbilical hernia contains loop of small bowel currently with no evidence of obstruction or strangulation. 3. Nonobstructing bilateral renal calculi 4. Bilateral lower lobe atelectasis. 8 mm pulmonary nodule right lower lobe. If the patient is at high risk for bronchogenic carcinoma, follow-up chest CT at 3-40m70months recommended. If the patient is at low risk for bronchogenic carcinoma, follow-up chest CT at 6-12 months is recommended. This recommendation follows the consensus statement: Guidelines for Management of Small Pulmonary Nodules Detected on CT Scans: A Statement from the [employer] published in Radiology 2005; 237:395-400.   Electronically Signed   By: RaySkipper ClicheD.   On: 10/27/2014 20:18   Dg Chest Port 1 View  10/29/2014   CLINICAL DATA:  Intubation.  EXAM: PORTABLE CHEST - 1 VIEW  COMPARISON:  10/28/2014 P  FINDINGS: Endotracheal tube and NG tube in stable position. Stable cardiomegaly with normal pulmonary vascularity. Persistent bibasilar pulmonary infiltrates are present. Small pleural effusions cannot be excluded. No pneumothorax.  IMPRESSION: 1.  Lines and tubes in stable  position.  2. Persistent bibasilar  pulmonary infiltrates. Bilateral small pleural effusions are most likely present .   Electronically Signed   By: Marcello Moores  Register   On: 10/29/2014 07:09   Dg Chest Port 1 View  10/28/2014   CLINICAL DATA:  Acute onset of respiratory failure. Subsequent encounter.  EXAM: PORTABLE CHEST - 1 VIEW  COMPARISON:  Chest radiograph performed earlier today at 3:58 a.m.  FINDINGS: The patient's endotracheal tube is seen ending 4 cm above the carina. An enteric tube is noted extending below the diaphragm.  Lung expansion is mildly improved. Vascular congestion is noted, with bibasilar airspace opacities, concerning for pulmonary edema. A small left pleural effusion is noted. No pneumothorax is seen.  The cardiomediastinal silhouette is borderline normal in size. No acute osseous abnormalities are identified.  IMPRESSION: 1. Endotracheal tube seen ending 4 cm above the carina. 2. Lung expansion mildly improved. Persistent relatively stable airspace opacities likely reflect pulmonary edema, though pneumonia might have a similar appearance. Small left pleural effusion noted.   Electronically Signed   By: Garald Balding M.D.   On: 10/28/2014 05:59   Portable Chest Xray  10/28/2014   CLINICAL DATA:  Sepsis.  Initial encounter.  EXAM: PORTABLE CHEST - 1 VIEW  COMPARISON:  Chest radiograph performed 10/27/2014  FINDINGS: The patient's endotracheal tube is seen ending 3 cm above the carina. An enteric tube is noted extending below the diaphragm.  The lungs are hypoexpanded. Vascular crowding and vascular congestion are seen. Bilateral central airspace opacities raise concern for pulmonary edema, though pneumonia could have a similar appearance. A small left pleural effusion is suspected. No pneumothorax is seen.  The cardiomediastinal silhouette is borderline normal in size. No acute osseous abnormalities are identified.  IMPRESSION: 1. Endotracheal tube seen ending 3 cm above the carina. 2. Lungs hypoexpanded. Vascular  congestion noted. Bilateral central airspace opacities raise concern for pulmonary edema, though pneumonia could have a similar appearance. Small left pleural effusion suspected.   Electronically Signed   By: Garald Balding M.D.   On: 10/28/2014 04:29   Dg Chest Port 1 View  10/27/2014   CLINICAL DATA:  Mid abdominal pain.  EXAM: PORTABLE CHEST - 1 VIEW  COMPARISON:  None.  FINDINGS: Mild enlargement of the cardiopericardial silhouette with cephalization of blood flow. No definite Kerley B-lines. Indistinct airspace opacity along the left hemidiaphragm. Mild blunting of the left lateral costophrenic angle.  IMPRESSION: 1. Mild enlargement of the cardiopericardial silhouette with cephalization of blood flow favoring pulmonary venous hypertension. 2. Suspected trace left pleural effusion. Suspected atelectasis at the left lung base.   Electronically Signed   By: Sherryl Barters M.D.   On: 10/27/2014 19:23    Medications / Allergies: per chart  Antibiotics: Anti-infectives    Start     Dose/Rate Route Frequency Ordered Stop   10/28/14 1800  vancomycin (VANCOCIN) 1,250 mg in sodium chloride 0.9 % 250 mL IVPB  Status:  Discontinued     1,250 mg166.7 mL/hr over 90 Minutes Intravenous Every 12 hours 10/28/14 0358 10/28/14 0824   10/28/14 1400  vancomycin (VANCOCIN) IVPB 1000 mg/200 mL premix     1,000 mg200 mL/hr over 60 Minutes Intravenous Every 12 hours 10/28/14 0824     10/28/14 1000  micafungin (MYCAMINE) 100 mg in sodium chloride 0.9 % 100 mL IVPB  Status:  Discontinued     100 mg100 mL/hr over 1 Hours Intravenous Daily 10/28/14 0342 10/28/14 0344   10/28/14 0800  metroNIDAZOLE (FLAGYL) IVPB  500 mg  Status:  Discontinued     500 mg100 mL/hr over 60 Minutes Intravenous Every 8 hours 10/28/14 0335 10/28/14 0343   10/28/14 0600  piperacillin-tazobactam (ZOSYN) IVPB 3.375 g     3.375 g12.5 mL/hr over 240 Minutes Intravenous 3 times per day 10/28/14 0339     10/28/14 0400  micafungin (MYCAMINE) 100 mg  in sodium chloride 0.9 % 100 mL IVPB     100 mg100 mL/hr over 1 Hours Intravenous Daily 10/28/14 0342     10/28/14 0400  vancomycin (VANCOCIN) 1,750 mg in sodium chloride 0.9 % 500 mL IVPB     1,750 mg250 mL/hr over 120 Minutes Intravenous  Once 10/28/14 0353 10/28/14 0749   10/28/14 0030  [MAR Hold]  metroNIDAZOLE (FLAGYL) IVPB 500 mg     (MAR Hold since 10/28/14 0043)   500 mg100 mL/hr over 60 Minutes Intravenous  Once 10/28/14 0026 10/28/14 0200   10/27/14 2030  piperacillin-tazobactam (ZOSYN) IVPB 3.375 g     3.375 g100 mL/hr over 30 Minutes Intravenous  Once 10/27/14 2026 10/27/14 2110       Note: Portions of this report may have been transcribed using voice recognition software. Every effort was made to ensure accuracy; however, inadvertent computerized transcription errors may be present.   Any transcriptional errors that result from this process are unintentional.

## 2014-10-29 NOTE — Progress Notes (Signed)
PULMONARY / CRITICAL CARE MEDICINE HISTORY AND PHYSICAL EXAMINATION   Name: William [name] MRN: [id number] DOB: 06-17-1951    ADMISSION DATE:  10/27/2014  PRIMARY SERVICE: PCCM  CHIEF COMPLAINT:  Perforated sigmoid colon  BRIEF PATIENT DESCRIPTION:  64 y/o M with HTN and nephrolithiasis who presented to [facility] on 1/10 with abdominal pain of 1 day duration. He was found to have a perforated sigmoid colon and was planned to be managed conservatively but became hypotensive and required urgent intervention 1/10 with a partial colectomy.   SIGNIFICANT EVENTS / STUDIES:  1/10  Admit, Partial colectomy  1/11  70% FiO2 / PEEP 12, mild bleeding from surgical site 1/11  ECHO >> EF 55-60%, nml systolic fxn, PA peak 32 1/12  Improved O2 needs, vasopressor need reduced.   SUBJECTIVE: RN reports improved vasopressor needs - levo to 3 mcg, O2 needs improved - down to 40%  VITAL SIGNS: Temp:  [97.7 F (36.5 C)-98.8 F (37.1 C)] 98.7 F (37.1 C) (01/12 0802) Pulse Rate:  [82-96] 92 (01/12 1147) Resp:  [13-27] 14 (01/12 1147) BP: (78-135)/(46-67) 103/49 mmHg (01/12 1147) SpO2:  [94 %-98 %] 94 % (01/12 1147) FiO2 (%):  [40 %-70 %] 40 % (01/12 1147) Weight:  [299 lb 12.8 oz (135.988 kg)] 299 lb 12.8 oz (135.988 kg) (01/12 0400)   HEMODYNAMICS:     VENTILATOR SETTINGS: Vent Mode:  [-] PRVC FiO2 (%):  [40 %-70 %] 40 % Set Rate:  [14 bmp] 14 bmp Vt Set:  [610 mL] 610 mL PEEP:  [5 cmH20-12 cmH20] 5 cmH20 Plateau Pressure:  [14 cmH20-20 cmH20] 14 cmH20   INTAKE / OUTPUT: Intake/Output      01/11 0701 - 01/12 0700 01/12 0701 - 01/13 0700   I.V. (mL/kg) 1930.5 (14.2) 329.8 (2.4)   Other  120   NG/GT 60    IV Piggyback 512.5 100   Total Intake(mL/kg) 2503 (18.4) 549.8 (4)   Urine (mL/kg/hr) 3150 (1) 250 (0.3)   Emesis/NG output 1000 (0.3)    Stool 125 (0)    Blood     Total Output 4275 250   Net -1772 +299.8          PHYSICAL EXAMINATION: General:  Obese male in NAD on vent  Neuro:   Sedated, periods of intermittent agitation, mittens in place HEENT:  Sclera anicteric, conjunctiva pink, ETT present Neck: Obese, no obvious JVD or LAN Cardiovascular:  s1s2 RRR Lungs:  Even/non-labored, lungs bilaterally clear Abdomen:  Obese, mild distention, lower abd VAC, LLQ colostomy with dusky stoma, mild blood tinged secretions in bag Musculoskeletal:  No acute deformities, moving all extremities spontaneously  Skin:  Intact  LABS:  CBC  Recent Labs Lab 10/28/14 1800 10/28/14 2327 10/29/14 0550  WBC 16.3* 13.3* 12.7*  HGB 13.8 13.3 13.3  HCT 42.5 40.8 41.5  PLT 199 186 167   Coag's  Recent Labs Lab 10/29/14 0550  INR 1.23   BMET  Recent Labs Lab 10/27/14 1810 10/28/14 0555 10/29/14 0550  NA 134* 134* 141  K 4.0 4.8 6.1*  CL 99 106 104  CO2 BUN CREATININE 1.17 1.17 1.03  GLUCOSE 170* 150* 138*   Electrolytes  Recent Labs Lab 10/27/14 1810 10/28/14 0555 10/29/14 0550  CALCIUM 9.4 7.4* 7.7*  MG  --   --  2.1  PHOS  --   --  2.6   Sepsis Markers  Recent Labs Lab 10/28/14 1230 10/28/14 1800 10/29/14 0550  LATICACIDVEN 2.4* 1.9 1.5   ABG  Recent Labs Lab 10/28/14 0704  PHART 7.317*  PCO2ART 42.4  PO2ART 99.1   Liver Enzymes  Recent Labs Lab 10/27/14 1810 10/28/14 0555 10/29/14 0550  AST 29 26 19   ALT 24 23 21   ALKPHOS 53 33* 36*  BILITOT 0.7 0.8 0.9  ALBUMIN 4.6 2.9* 2.8*   Cardiac Enzymes  Recent Labs Lab 10/28/14 1230 10/28/14 1800 10/29/14 0550  TROPONINI <0.03 <0.03 <0.03   Glucose No results for input(s): GLUCAP in the last 168 hours.  Imaging Ct Abdomen Pelvis W Contrast  10/27/2014   CLINICAL DATA:  Initial evaluation sudden onset left abdominal pain today, personal history of umbilical hernia kidney stones hypertension, elevated white blood count  EXAM: CT ABDOMEN AND PELVIS WITH CONTRAST  TECHNIQUE: Multidetector CT imaging of the abdomen and pelvis was performed using the standard  protocol following bolus administration of intravenous contrast.  CONTRAST:  50mL OMNIPAQUE IOHEXOL 300 MG/ML SOLN, 100mL OMNIPAQUE IOHEXOL 300 MG/ML SOLN  COMPARISON:  None.  FINDINGS: Liver and gallbladder are normal. Spleen is normal. Pancreas is normal. Adrenal glands are normal.  Numerous small bilateral nonobstructing renal calculi all measuring less than or equal to about 6 mm. Mild bilateral perinephric inflammatory changes likely chronic. No hydronephrosis. Bladder is normal.  Mild calcification of the abdominal aorta. No significant retroperitoneal adenopathy. Reproductive organs are normal. Fat containing inguinal hernias bilaterally.  Periumbilical hernia identified containing a loop of small bowel. Hernia neck is narrow at 2.5 cm and the hernia measures 2.5 x 5.9 cm. No evidence of obstruction or strangulation however.  Relatively mobile large bowel with cecum in the right mid abdomen to upper quadrant. Air-fluid levels proximal large bowel. Diverticulosis throughout the descending and sigmoid colon. Mild inflammatory change posterior to the proximal sigmoid colon. Additionally, there is an extraluminal collection of gas and debris measuring 61 x 66 mm immediately posterior and caudal to the proximal sigmoid colon.  No free fluid.  No free air.  No acute musculoskeletal findings.  A mm right lower lobe pulmonary nodule. Atelectasis right lung base. Atelectasis left lung base. Mild cardiac enlargement.  IMPRESSION: 1. Diverticulitis proximal sigmoid colon. There is evidence of a focal contained perforation measuring 6.6 x 6.1 cm. 2. Narrow neck umbilical hernia contains loop of small bowel currently with no evidence of obstruction or strangulation. 3. Nonobstructing bilateral renal calculi 4. Bilateral lower lobe atelectasis. 8 mm pulmonary nodule right lower lobe. If the patient is at high risk for bronchogenic carcinoma, follow-up chest CT at 3-766months is recommended. If the patient is at low risk for  bronchogenic carcinoma, follow-up chest CT at 6-12 months is recommended. This recommendation follows the consensus statement: Guidelines for Management of Small Pulmonary Nodules Detected on CT Scans: A Statement from the Fleischner Society as published in Radiology 2005; 237:395-400.   Electronically Signed   By: Esperanza Heiraymond  Rubner M.D.   On: 10/27/2014 20:18   Dg Chest Port 1 View  10/29/2014   CLINICAL DATA:  Intubation.  EXAM: PORTABLE CHEST - 1 VIEW  COMPARISON:  10/28/2014 P  FINDINGS: Endotracheal tube and NG tube in stable position. Stable cardiomegaly with normal pulmonary vascularity. Persistent bibasilar pulmonary infiltrates are present. Small pleural effusions cannot be excluded. No pneumothorax.  IMPRESSION: 1.  Lines and tubes in stable position.  2. Persistent bibasilar pulmonary infiltrates. Bilateral small pleural effusions are most likely present .   Electronically Signed   By: Maisie Fushomas  Register   On: 10/29/2014 07:09  Dg Chest Port 1 View  10/28/2014   CLINICAL DATA:  Acute onset of respiratory failure. Subsequent encounter.  EXAM: PORTABLE CHEST - 1 VIEW  COMPARISON:  Chest radiograph performed earlier today at 3:58 a.m.  FINDINGS: The patient's endotracheal tube is seen ending 4 cm above the carina. An enteric tube is noted extending below the diaphragm.  Lung expansion is mildly improved. Vascular congestion is noted, with bibasilar airspace opacities, concerning for pulmonary edema. A small left pleural effusion is noted. No pneumothorax is seen.  The cardiomediastinal silhouette is borderline normal in size. No acute osseous abnormalities are identified.  IMPRESSION: 1. Endotracheal tube seen ending 4 cm above the carina. 2. Lung expansion mildly improved. Persistent relatively stable airspace opacities likely reflect pulmonary edema, though pneumonia might have a similar appearance. Small left pleural effusion noted.   Electronically Signed   By: Roanna Raider M.D.   On: 10/28/2014  05:59   Portable Chest Xray  10/28/2014   CLINICAL DATA:  Sepsis.  Initial encounter.  EXAM: PORTABLE CHEST - 1 VIEW  COMPARISON:  Chest radiograph performed 10/27/2014  FINDINGS: The patient's endotracheal tube is seen ending 3 cm above the carina. An enteric tube is noted extending below the diaphragm.  The lungs are hypoexpanded. Vascular crowding and vascular congestion are seen. Bilateral central airspace opacities raise concern for pulmonary edema, though pneumonia could have a similar appearance. A small left pleural effusion is suspected. No pneumothorax is seen.  The cardiomediastinal silhouette is borderline normal in size. No acute osseous abnormalities are identified.  IMPRESSION: 1. Endotracheal tube seen ending 3 cm above the carina. 2. Lungs hypoexpanded. Vascular congestion noted. Bilateral central airspace opacities raise concern for pulmonary edema, though pneumonia could have a similar appearance. Small left pleural effusion suspected.   Electronically Signed   By: Roanna Raider M.D.   On: 10/28/2014 04:29   Dg Chest Port 1 View  10/27/2014   CLINICAL DATA:  Mid abdominal pain.  EXAM: PORTABLE CHEST - 1 VIEW  COMPARISON:  None.  FINDINGS: Mild enlargement of the cardiopericardial silhouette with cephalization of blood flow. No definite Kerley B-lines. Indistinct airspace opacity along the left hemidiaphragm. Mild blunting of the left lateral costophrenic angle.  IMPRESSION: 1. Mild enlargement of the cardiopericardial silhouette with cephalization of blood flow favoring pulmonary venous hypertension. 2. Suspected trace left pleural effusion. Suspected atelectasis at the left lung base.   Electronically Signed   By: Herbie Baltimore M.D.   On: 10/27/2014 19:23    ASSESSMENT / PLAN:  Active Problems:   Diverticulitis of colon with perforation   Shock circulatory   Acute respiratory failure with hypoxia   Acute post-operative pain   Solitary pulmonary nodule on lung CT   Sleep  apnea   PULMONARY A: Acute Hypoxic Resp Failure - improving O2 needs, down to 40% 1/12 OSA on CPAP 8 mm RLL Nodule P:   Lung protective ventilation VAP prevention measures  SBT/WUA when surgical plan clear Will need follow-up CTs as OP for nodule CPAP post extubation in setting of OSA   CARDIOVASCULAR ETT 1/10 >> L femoral CVC 1/11 >> Art Line to be placed 1/11 >> A: Circulatory Shock - secondary to perforation. S/p ~ 5.7 L fluid resuscitation. Improving, pressor need reduced.  NSTEMI - likely demand ischemia HTN P:   Negative lactate / cortisol reassuring  Norepi to maintain MAP of 65 Follow hemodynamics   RENAL A: Hyperkalemia  Hyponatremia - Likely 2/2 mild dehydration. Lactic Acidosis  P:   Trend BMP Replace electrolytes as indicated Repeat Potassium now to review as 6.1 early am  GASTROINTESTINAL A: Perforated Sigmoid s/p Partial Colectomy (1/10) Nutrition  P:   Management per surgery Defer feeding to CCS NPO PPI  HEMATOLOGIC A: Bleeding - mild ooze from surgical sites, slowing.  No change in H/H.  CCS aware.  P: CBC daily  SCD's   INFECTIOUS Abscess culture 1/11 >>  Blood culture 1/11 >> Urine culture 1/11 >> A: Intraabdominal abscess (Sigmoid Diverticulitis) s/p Perforation  P:   Follow-up cultures Vanc 1/10, D3/x Zosyn 1/10, D3/x Micafungin 1/10 D3/x   ENDOCRINE A: Hyperglycemia Hypothyroidism  P:   SSI  Continue synthroid IV  NEUROLOGIC A: Post Operative Pain - s/p partial colectomy  P: Diprivan gtt Fent gtt RASS Goal:  -2   Social / Family: No family available at bedside 1/12.    Canary Brim, NP-C [facility] Pgr: [phone] or [phone]    10/29/2014 12:37 PM

## 2014-10-30 ENCOUNTER — Inpatient Hospital Stay (HOSPITAL_COMMUNITY): Payer: 59

## 2014-10-30 LAB — CBC WITH DIFFERENTIAL/PLATELET
BASOS ABS: 0 10*3/uL (ref 0.0–0.1)
BASOS PCT: 0 % (ref 0–1)
EOS PCT: 0 % (ref 0–5)
Eosinophils Absolute: 0 10*3/uL (ref 0.0–0.7)
HCT: 35.5 % — ABNORMAL LOW (ref 39.0–52.0)
HEMOGLOBIN: 11.5 g/dL — AB (ref 13.0–17.0)
Lymphocytes Relative: 11 % — ABNORMAL LOW (ref 12–46)
Lymphs Abs: 1 10*3/uL (ref 0.7–4.0)
MCH: 32.3 pg (ref 26.0–34.0)
MCHC: 32.4 g/dL (ref 30.0–36.0)
MCV: 99.7 fL (ref 78.0–100.0)
Monocytes Absolute: 0.3 10*3/uL (ref 0.1–1.0)
Monocytes Relative: 4 % (ref 3–12)
Neutro Abs: 7.7 10*3/uL (ref 1.7–7.7)
Neutrophils Relative %: 85 % — ABNORMAL HIGH (ref 43–77)
Platelets: 159 10*3/uL (ref 150–400)
RBC: 3.56 MIL/uL — AB (ref 4.22–5.81)
RDW: 14.6 % (ref 11.5–15.5)
WBC: 9.1 10*3/uL (ref 4.0–10.5)

## 2014-10-30 LAB — BASIC METABOLIC PANEL
ANION GAP: 3 — AB (ref 5–15)
BUN: 14 mg/dL (ref 6–23)
CO2: 27 mmol/L (ref 19–32)
Calcium: 7.3 mg/dL — ABNORMAL LOW (ref 8.4–10.5)
Chloride: 103 mEq/L (ref 96–112)
Creatinine, Ser: 1.02 mg/dL (ref 0.50–1.35)
GFR calc non Af Amer: 77 mL/min — ABNORMAL LOW (ref >90)
GFR, EST AFRICAN AMERICAN: 90 mL/min — AB (ref >90)
Glucose, Bld: 217 mg/dL — ABNORMAL HIGH (ref 70–99)
Potassium: 3.5 mmol/L (ref 3.5–5.1)
SODIUM: 133 mmol/L — AB (ref 135–145)

## 2014-10-30 LAB — VANCOMYCIN, TROUGH: Vancomycin Tr: 8.5 ug/mL — ABNORMAL LOW (ref 10.0–20.0)

## 2014-10-30 LAB — PHOSPHORUS: PHOSPHORUS: 1.7 mg/dL — AB (ref 2.3–4.6)

## 2014-10-30 LAB — GLUCOSE, CAPILLARY: GLUCOSE-CAPILLARY: 91 mg/dL (ref 70–99)

## 2014-10-30 LAB — MAGNESIUM: Magnesium: 1.9 mg/dL (ref 1.5–2.5)

## 2014-10-30 MED ORDER — CLINIMIX E/DEXTROSE (5/15) 5 % IV SOLN
INTRAVENOUS | Status: AC
  Administered 2014-10-30: 19:00:00 via INTRAVENOUS
  Filled 2014-10-30: qty 1440

## 2014-10-30 MED ORDER — VANCOMYCIN HCL IN DEXTROSE 1-5 GM/200ML-% IV SOLN
1000.0000 mg | Freq: Three times a day (TID) | INTRAVENOUS | Status: DC
  Administered 2014-10-30 – 2014-11-01 (×5): 1000 mg via INTRAVENOUS
  Filled 2014-10-30 (×5): qty 200

## 2014-10-30 MED ORDER — SODIUM CHLORIDE 0.9 % IJ SOLN
10.0000 mL | INTRAMUSCULAR | Status: DC | PRN
  Administered 2014-11-18 (×2): 30 mL
  Administered 2014-11-19: 10 mL
  Filled 2014-10-30 (×3): qty 40

## 2014-10-30 MED ORDER — POTASSIUM CHLORIDE 10 MEQ/50ML IV SOLN
10.0000 meq | INTRAVENOUS | Status: AC
  Administered 2014-10-30 (×3): 10 meq via INTRAVENOUS
  Filled 2014-10-30 (×3): qty 50

## 2014-10-30 MED ORDER — SODIUM CHLORIDE 0.9 % IJ SOLN
10.0000 mL | Freq: Two times a day (BID) | INTRAMUSCULAR | Status: DC
  Administered 2014-10-30 – 2014-11-08 (×16): 10 mL
  Administered 2014-11-09: 30 mL
  Administered 2014-11-10: 20 mL
  Administered 2014-11-11 – 2014-11-14 (×7): 10 mL
  Administered 2014-11-15: 30 mL
  Administered 2014-11-15 – 2014-11-17 (×4): 10 mL

## 2014-10-30 MED ORDER — FUROSEMIDE 10 MG/ML IJ SOLN
20.0000 mg | Freq: Once | INTRAMUSCULAR | Status: AC
  Administered 2014-10-30: 20 mg via INTRAVENOUS
  Filled 2014-10-30: qty 2

## 2014-10-30 MED ORDER — FUROSEMIDE 10 MG/ML IJ SOLN
40.0000 mg | Freq: Three times a day (TID) | INTRAMUSCULAR | Status: DC
  Administered 2014-10-30 – 2014-11-01 (×6): 40 mg via INTRAVENOUS
  Filled 2014-10-30 (×6): qty 4

## 2014-10-30 MED ORDER — INSULIN ASPART 100 UNIT/ML ~~LOC~~ SOLN
0.0000 [IU] | Freq: Four times a day (QID) | SUBCUTANEOUS | Status: DC
  Administered 2014-10-31 (×5): 2 [IU] via SUBCUTANEOUS
  Administered 2014-11-01 (×2): 3 [IU] via SUBCUTANEOUS
  Administered 2014-11-01 (×2): 2 [IU] via SUBCUTANEOUS
  Administered 2014-11-02: 3 [IU] via SUBCUTANEOUS

## 2014-10-30 MED ORDER — MAGNESIUM SULFATE 2 GM/50ML IV SOLN
2.0000 g | Freq: Once | INTRAVENOUS | Status: AC
Start: 2014-10-30 — End: 2014-10-30
  Administered 2014-10-30: 2 g via INTRAVENOUS
  Filled 2014-10-30: qty 50

## 2014-10-30 MED ORDER — POTASSIUM PHOSPHATES 15 MMOLE/5ML IV SOLN
30.0000 mmol | Freq: Once | INTRAVENOUS | Status: AC
  Administered 2014-10-30: 30 mmol via INTRAVENOUS
  Filled 2014-10-30: qty 10

## 2014-10-30 NOTE — Progress Notes (Signed)
William [name]  [address] [place], [place] [address] Phone: [phone] FAX: [phone]    William [name] [id number] [birth year]  CARE TEAM:  PCP: No primary care provider on file.  Outpatient Care Team: No care team member to display  Inpatient Treatment Team: Treatment Team: Attending Provider: Md Edison Pace, MD; Technician: Kathlen [place], [place]; Attending Physician: Md Edison Pace, MD; Consulting Physician: Md Pccm, MD; Registered Nurse: Sheron Nightingale, RN; Registered Nurse: Carl Best, RN   Subjective:  Weaning vent/pressors a lot more ICU RN Ruby in room  Objective:  Vital signs:  Filed Vitals:   10/30/14 0326 10/30/14 0400 10/30/14 0500 10/30/14 0600  BP: 106/57 114/67 122/72   Pulse: 94 95 94 88  Temp:  100.2 F (37.9 C)    TempSrc:  Oral    Resp: 16 14 17 8   Height:      Weight:  293 lb 10.4 oz (133.2 kg)    SpO2: 91% 89% 89% 90%    Last BM Date: 10/28/14  Intake/Output   Yesterday:  01/12 0701 - 01/13 0700 In: 3216 [I.V.:1836; IV Piggyback:1260] Out: 2300 [Urine:1890; Emesis/NG output:400; Stool:10] This shift:     Bowel function:  Flatus: n  BM: n  Drain: NGT thinner bilious  Physical Exam:  General: Pt intubated/sedated.  Does wake up & occ follow commands Eyes: PERRL, normal EOM.  Sclera clear.  No icterus Neuro: CN II-XII intact w/o focal sensory/motor deficits. Lymph: No head/neck/groin lymphadenopathy Psych:  No delerium/psychosis/paranoia HENT: Normocephalic, Mucus membranes moist.  No thrush.  OGT & ETT in place Neck: Supple, No tracheal deviation Chest:  Course BS Bilaterally. No chest wall pain w good excursion CV:  Pulses intact.  Regular rhythm MS: Normal AROM mjr joints.  No obvious deformity Abdomen: Mod distended.  Obese.  Wound vac in place.  Colostomy purple dusky & edematous but stable.  No incarcerated hernias. Ext:  SCDs BLE.  2+ edema.  No cyanosis Skin: No petechiae /  purpura   Problem List:   Active Problems:   Diverticulitis of colon with perforation   Shock circulatory   Acute respiratory failure with hypoxia   Acute post-operative pain   Solitary pulmonary nodule on lung CT   Sleep apnea   Assessment  William [name]  64 y.o. male  2 Days Post-Op  Procedure(s): PARTIAL COLECTOMY Sigmoid COLOSTOMY EXPLORATORY LAPAROTOMY  Guarded but improved  Plan:  -vent support - wean better but challenge with ARDS & morbid obesity/distention -levophed - wean gradually on/off now Volume as needed - already +5.6L - see if can diurese when off pressors > 24hrs K falsely elevated - follow IV ABx - Zosyn at least.  Vanco per ICU F/u pathology Ostomy care Wound care -start TNA for prolonged ileus -VTE prophylaxis- SCDs, etc -mobilize as tolerated to help recovery  I updated the status of the patient to the patient's ICU RN.  I made recommendations.  I answered questions.  Understanding & appreciation was expressed.   Adin Hector, M.D., F.A.C.S. Gastrointestinal and Minimally Invasive Surgery [facility] [address], [place], [place] [address] [phone] Main / Paging   10/30/2014   Results:   Labs: Results for orders placed or performed during the hospital encounter of 10/27/14 (from the past 48 hour(s))  MRSA PCR Screening     Status: None   Collection Time: 10/28/14  8:55 AM  Result Value Ref Range   MRSA by PCR  NEGATIVE NEGATIVE    Comment:        The GeneXpert MRSA Assay (FDA approved for NASAL specimens only), is one component of a comprehensive MRSA colonization surveillance program. It is not intended to diagnose MRSA infection nor to guide or monitor treatment for MRSA infections.   Culture, Urine     Status: None   Collection Time: 10/28/14  9:40 AM  Result Value Ref Range   Specimen Description URINE, CATHETERIZED    Special Requests NONE    Colony Count NO  GROWTH Performed at [facility]     Culture NO GROWTH Performed at [facility]     Report Status 10/29/2014 FINAL   Lactic acid, plasma     Status: Abnormal   Collection Time: 10/28/14 12:30 PM  Result Value Ref Range   Lactic Acid, Venous 2.4 (H) 0.5 - 2.2 mmol/L  Troponin I (q 6hr x 3)     Status: None   Collection Time: 10/28/14 12:30 PM  Result Value Ref Range   Troponin I <0.03 <0.031 ng/mL    Comment:        NO INDICATION OF MYOCARDIAL INJURY. Please note change in reference range.   CBC     Status: Abnormal   Collection Time: 10/28/14 12:30 PM  Result Value Ref Range   WBC 16.5 (H) 4.0 - 10.5 K/uL   RBC 4.39 4.22 - 5.81 MIL/uL   Hemoglobin 14.1 13.0 - 17.0 g/dL   HCT 43.3 39.0 - 52.0 %   MCV 98.6 78.0 - 100.0 fL   MCH 32.1 26.0 - 34.0 pg   MCHC 32.6 30.0 - 36.0 g/dL   RDW 14.6 11.5 - 15.5 %   Platelets 192 150 - 400 K/uL  Lactic acid, plasma     Status: None   Collection Time: 10/28/14  6:00 PM  Result Value Ref Range   Lactic Acid, Venous 1.9 0.5 - 2.2 mmol/L  Troponin I (q 6hr x 3)     Status: None   Collection Time: 10/28/14  6:00 PM  Result Value Ref Range   Troponin I <0.03 <0.031 ng/mL    Comment:        NO INDICATION OF MYOCARDIAL INJURY. Please note change in reference range.   CBC     Status: Abnormal   Collection Time: 10/28/14  6:00 PM  Result Value Ref Range   WBC 16.3 (H) 4.0 - 10.5 K/uL   RBC 4.28 4.22 - 5.81 MIL/uL   Hemoglobin 13.8 13.0 - 17.0 g/dL   HCT 42.5 39.0 - 52.0 %   MCV 99.3 78.0 - 100.0 fL   MCH 32.2 26.0 - 34.0 pg   MCHC 32.5 30.0 - 36.0 g/dL   RDW 14.9 11.5 - 15.5 %   Platelets 199 150 - 400 K/uL  CBC     Status: Abnormal   Collection Time: 10/28/14 11:27 PM  Result Value Ref Range   WBC 13.3 (H) 4.0 - 10.5 K/uL   RBC 4.09 (L) 4.22 - 5.81 MIL/uL   Hemoglobin 13.3 13.0 - 17.0 g/dL   HCT 40.8 39.0 - 52.0 %   MCV 99.8 78.0 - 100.0 fL   MCH 32.5 26.0 - 34.0 pg   MCHC 32.6 30.0 - 36.0 g/dL   RDW 15.1  11.5 - 15.5 %   Platelets 186 150 - 400 K/uL  Comprehensive metabolic panel     Status: Abnormal   Collection Time: 10/29/14  5:50 AM  Result Value Ref  Range   Sodium 141 135 - 145 mmol/L    Comment: Please note change in reference range. REPEATED TO VERIFY DELTA CHECK NOTED    Potassium 6.1 (HH) 3.5 - 5.1 mmol/L    Comment: REPEATED TO VERIFY DELTA CHECK NOTED NO VISIBLE HEMOLYSIS CRITICAL RESULT CALLED TO, READ BACK BY AND VERIFIED WITH: T.BANKS RN AT [phone] ON 01.12.16 Please note change in reference range. BY SHUEA    Chloride 104 96 - 112 mEq/L   CO2 28 19 - 32 mmol/L   Glucose, Bld 138 (H) 70 - 99 mg/dL   BUN 19 6 - 23 mg/dL   Creatinine, Ser 1.03 0.50 - 1.35 mg/dL   Calcium 7.7 (L) 8.4 - 10.5 mg/dL   Total Protein 5.4 (L) 6.0 - 8.3 g/dL   Albumin 2.8 (L) 3.5 - 5.2 g/dL   AST 19 0 - 37 U/L   ALT 21 0 - 53 U/L   Alkaline Phosphatase 36 (L) 39 - 117 U/L   Total Bilirubin 0.9 0.3 - 1.2 mg/dL   GFR calc non Af Amer 76 (L) >90 mL/min   GFR calc Af Amer 89 (L) >90 mL/min    Comment: (NOTE) The eGFR has been calculated using the CKD EPI equation. This calculation has not been validated in all clinical situations. eGFR's persistently <90 mL/min signify possible Chronic Kidney Disease.    Anion gap 9 5 - 15  CBC with Differential     Status: Abnormal   Collection Time: 10/29/14  5:50 AM  Result Value Ref Range   WBC 12.7 (H) 4.0 - 10.5 K/uL   RBC 4.16 (L) 4.22 - 5.81 MIL/uL   Hemoglobin 13.3 13.0 - 17.0 g/dL   HCT 41.5 39.0 - 52.0 %   MCV 99.8 78.0 - 100.0 fL   MCH 32.0 26.0 - 34.0 pg   MCHC 32.0 30.0 - 36.0 g/dL   RDW 15.0 11.5 - 15.5 %   Platelets 167 150 - 400 K/uL   Neutrophils Relative % 86 (H) 43 - 77 %   Lymphocytes Relative 9 (L) 12 - 46 %   Monocytes Relative 5 3 - 12 %   Eosinophils Relative 0 0 - 5 %   Basophils Relative 0 0 - 1 %   Neutro Abs 11.0 (H) 1.7 - 7.7 K/uL   Lymphs Abs 1.1 0.7 - 4.0 K/uL   Monocytes Absolute 0.6 0.1 - 1.0 K/uL   Eosinophils  Absolute 0.0 0.0 - 0.7 K/uL   Basophils Absolute 0.0 0.0 - 0.1 K/uL   WBC Morphology MILD LEFT SHIFT (1-5% METAS, OCC MYELO, OCC BANDS)     Comment: VACUOLATED NEUTROPHILS  Phosphorus     Status: None   Collection Time: 10/29/14  5:50 AM  Result Value Ref Range   Phosphorus 2.6 2.3 - 4.6 mg/dL  Magnesium     Status: None   Collection Time: 10/29/14  5:50 AM  Result Value Ref Range   Magnesium 2.1 1.5 - 2.5 mg/dL  Troponin I     Status: None   Collection Time: 10/29/14  5:50 AM  Result Value Ref Range   Troponin I <0.03 <0.031 ng/mL    Comment:        NO INDICATION OF MYOCARDIAL INJURY. Please note change in reference range.   Lactic acid, plasma     Status: None   Collection Time: 10/29/14  5:50 AM  Result Value Ref Range   Lactic Acid, Venous 1.5 0.5 - 2.2 mmol/L  Protime-INR  Status: Abnormal   Collection Time: 10/29/14  5:50 AM  Result Value Ref Range   Prothrombin Time 15.7 (H) 11.6 - 15.2 seconds   INR 1.23 0.00 - 1.49  Potassium     Status: None   Collection Time: 10/29/14  1:50 PM  Result Value Ref Range   Potassium 3.9 3.5 - 5.1 mmol/L    Comment: Please note change in reference range. DELTA CHECK NOTED   CBC with Differential     Status: Abnormal   Collection Time: 10/30/14  3:45 AM  Result Value Ref Range   WBC 9.1 4.0 - 10.5 K/uL   RBC 3.56 (L) 4.22 - 5.81 MIL/uL   Hemoglobin 11.5 (L) 13.0 - 17.0 g/dL   HCT 35.5 (L) 39.0 - 52.0 %   MCV 99.7 78.0 - 100.0 fL   MCH 32.3 26.0 - 34.0 pg   MCHC 32.4 30.0 - 36.0 g/dL   RDW 14.6 11.5 - 15.5 %   Platelets 159 150 - 400 K/uL   Neutrophils Relative % 85 (H) 43 - 77 %   Neutro Abs 7.7 1.7 - 7.7 K/uL   Lymphocytes Relative 11 (L) 12 - 46 %   Lymphs Abs 1.0 0.7 - 4.0 K/uL   Monocytes Relative 4 3 - 12 %   Monocytes Absolute 0.3 0.1 - 1.0 K/uL   Eosinophils Relative 0 0 - 5 %   Eosinophils Absolute 0.0 0.0 - 0.7 K/uL   Basophils Relative 0 0 - 1 %   Basophils Absolute 0.0 0.0 - 0.1 K/uL  Basic metabolic panel      Status: Abnormal   Collection Time: 10/30/14  3:45 AM  Result Value Ref Range   Sodium 133 (L) 135 - 145 mmol/L    Comment: Please note change in reference range. RESULT REPEATED AND VERIFIED DELTA CHECK NOTED    Potassium 3.5 3.5 - 5.1 mmol/L    Comment: Please note change in reference range.   Chloride 103 96 - 112 mEq/L   CO2 27 19 - 32 mmol/L   Glucose, Bld 217 (H) 70 - 99 mg/dL   BUN 14 6 - 23 mg/dL   Creatinine, Ser 1.02 0.50 - 1.35 mg/dL   Calcium 7.3 (L) 8.4 - 10.5 mg/dL   GFR calc non Af Amer 77 (L) >90 mL/min   GFR calc Af Amer 90 (L) >90 mL/min    Comment: (NOTE) The eGFR has been calculated using the CKD EPI equation. This calculation has not been validated in all clinical situations. eGFR's persistently <90 mL/min signify possible Chronic Kidney Disease.    Anion gap 3 (L) 5 - 15  Phosphorus     Status: Abnormal   Collection Time: 10/30/14  3:45 AM  Result Value Ref Range   Phosphorus 1.7 (L) 2.3 - 4.6 mg/dL  Magnesium     Status: None   Collection Time: 10/30/14  3:45 AM  Result Value Ref Range   Magnesium 1.9 1.5 - 2.5 mg/dL    Imaging / Studies: Dg Chest Port 1 View  10/30/2014   CLINICAL DATA:  Respiratory failure.  Intubated patient.  EXAM: PORTABLE CHEST - 1 VIEW  COMPARISON:  Single view of the chest 10/27/2014, 10/28/2014 and 10/29/2014.  FINDINGS: Endotracheal tube and NG tube remain in place in good position. Bilateral effusions and basilar airspace disease have worsened. There is marked cardiomegaly. No pneumothorax is identified.  IMPRESSION: Worsened bilateral effusions and basilar airspace disease.   Electronically Signed   By: Marcello Moores  Dalessio M.D.   On: 10/30/2014 07:31   Dg Chest Port 1 View  10/29/2014   CLINICAL DATA:  Intubation.  EXAM: PORTABLE CHEST - 1 VIEW  COMPARISON:  10/28/2014 P  FINDINGS: Endotracheal tube and NG tube in stable position. Stable cardiomegaly with normal pulmonary vascularity. Persistent bibasilar pulmonary  infiltrates are present. Small pleural effusions cannot be excluded. No pneumothorax.  IMPRESSION: 1.  Lines and tubes in stable position.  2. Persistent bibasilar pulmonary infiltrates. Bilateral small pleural effusions are most likely present .   Electronically Signed   By: Marcello Moores  Register   On: 10/29/2014 07:09    Medications / Allergies: per chart  Antibiotics: Anti-infectives    Start     Dose/Rate Route Frequency Ordered Stop   10/28/14 1800  vancomycin (VANCOCIN) 1,250 mg in sodium chloride 0.9 % 250 mL IVPB  Status:  Discontinued     1,250 mg166.7 mL/hr over 90 Minutes Intravenous Every 12 hours 10/28/14 0358 10/28/14 0824   10/28/14 1400  vancomycin (VANCOCIN) IVPB 1000 mg/200 mL premix     1,000 mg200 mL/hr over 60 Minutes Intravenous Every 12 hours 10/28/14 0824     10/28/14 1000  micafungin (MYCAMINE) 100 mg in sodium chloride 0.9 % 100 mL IVPB  Status:  Discontinued     100 mg100 mL/hr over 1 Hours Intravenous Daily 10/28/14 0342 10/28/14 0344   10/28/14 0800  metroNIDAZOLE (FLAGYL) IVPB 500 mg  Status:  Discontinued     500 mg100 mL/hr over 60 Minutes Intravenous Every 8 hours 10/28/14 0335 10/28/14 0343   10/28/14 0600  piperacillin-tazobactam (ZOSYN) IVPB 3.375 g     3.375 g12.5 mL/hr over 240 Minutes Intravenous 3 times per day 10/28/14 0339     10/28/14 0400  micafungin (MYCAMINE) 100 mg in sodium chloride 0.9 % 100 mL IVPB     100 mg100 mL/hr over 1 Hours Intravenous Daily 10/28/14 0342     10/28/14 0400  vancomycin (VANCOCIN) 1,750 mg in sodium chloride 0.9 % 500 mL IVPB     1,750 mg250 mL/hr over 120 Minutes Intravenous  Once 10/28/14 0353 10/28/14 0749   10/28/14 0030  [MAR Hold]  metroNIDAZOLE (FLAGYL) IVPB 500 mg     (MAR Hold since 10/28/14 0043)   500 mg100 mL/hr over 60 Minutes Intravenous  Once 10/28/14 0026 10/28/14 0200   10/27/14 2030  piperacillin-tazobactam (ZOSYN) IVPB 3.375 g     3.375 g100 mL/hr over 30 Minutes Intravenous  Once 10/27/14 2026 10/27/14  2110       Note: Portions of this report may have been transcribed using voice recognition software. Every effort was made to ensure accuracy; however, inadvertent computerized transcription errors may be present.   Any transcriptional errors that result from this process are unintentional.

## 2014-10-30 NOTE — Progress Notes (Signed)
eLink Physician-Brief Progress Note Patient Name: William OuJoseph [name] DOB: 01/06/1951 MRN: [id number]   Date of Service  10/30/2014  HPI/Events of Note  K, phos low  eICU Interventions  Replete KPhos      Intervention Category Intermediate Interventions: Electrolyte abnormality - evaluation and management  Sandip Power 10/30/2014, 6:19 AM

## 2014-10-30 NOTE — Consult Note (Signed)
WOC ostomy follow up Stoma type/location: LLQ COlostomy Stomal assessment/size: 1 and 1/8 x 2 inches on Monday.   Peristomal assessment: not seen today Treatment options for stomal/peristomal skin: None indicated Output serosanguinous Ostomy pouching: 2pc. With skin barrier ring Education provided: Wife instructed that ostomy pouching system would be changed tomorrow as there has been no output.  Midline wound NPWT dressing will be changed today by ICU Bedside RN Enrolled patient in [facility] [employer] Discharge program: No WOC nursing team will follow, and will remain available to this patient, the nursing, surgical and medical teams.  Thanks, Ladona MowLaurie Aydeen Blume, MSN, RN, GNP, [place]an[place]OCN, CWON-AP [phone])

## 2014-10-30 NOTE — Progress Notes (Signed)
Peripherally Inserted Central Catheter/Midline Placement  The IV Nurse has discussed with the patient and/or persons authorized to consent for the patient, the purpose of this procedure and the potential benefits and risks involved with this procedure.  The benefits include less needle sticks, lab draws from the catheter and patient may be discharged home with the catheter.  Risks include, but not limited to, infection, bleeding, blood clot (thrombus formation), and puncture of an artery; nerve damage and irregular heat beat.  Alternatives to this procedure were also discussed.  PICC/Midline Placement Documentation     Wife signed consent   Vevelyn PatDuncan, Sonji Starkes Jean 10/30/2014, 4:46 PM

## 2014-10-30 NOTE — Progress Notes (Signed)
PULMONARY / CRITICAL CARE MEDICINE HISTORY AND PHYSICAL EXAMINATION   Name: William [name] MRN: [id number] DOB: 28-Jun-1951    ADMISSION DATE:  10/27/2014  PRIMARY SERVICE: PCCM  CHIEF COMPLAINT:  Perforated sigmoid colon  BRIEF PATIENT DESCRIPTION:  64 y/o M with HTN and nephrolithiasis who presented to [facility] on 1/10 with abdominal pain of 1 day duration. He was found to have a perforated sigmoid colon and was planned to be managed conservatively but became hypotensive and required urgent intervention 1/10 with a partial colectomy.   SIGNIFICANT EVENTS / STUDIES:  1/10  Admit, Partial colectomy  1/11  70% FiO2 / PEEP 12, mild bleeding from surgical site 1/11  ECHO >> EF 55-60%, nml systolic fxn, PA peak 32 1/12  Improved O2 needs, vasopressor need reduced.   SUBJECTIVE: Off pressors.  BP stable.  Failed WUA with significant agitation & desaturations.  Required increase in O2 / PEEP from 40/8 to 70/12 with slow recovery   VITAL SIGNS: Temp:  [99.1 F (37.3 C)-100.8 F (38.2 C)] 99.6 F (37.6 C) (01/13 0800) Pulse Rate:  [86-104] 101 (01/13 1100) Resp:  [8-23] 19 (01/13 1000) BP: (86-132)/(38-76) 132/76 mmHg (01/13 1100) SpO2:  [89 %-95 %] 90 % (01/13 1100) FiO2 (%):  [40 %] 40 % (01/13 0837) Weight:  [293 lb 10.4 oz (133.2 kg)] 293 lb 10.4 oz (133.2 kg) (01/13 0400)   HEMODYNAMICS:     VENTILATOR SETTINGS: Vent Mode:  [-] PRVC FiO2 (%):  [40 %] 40 % Set Rate:  [14 bmp] 14 bmp Vt Set:  [610 mL] 610 mL PEEP:  [5 cmH20] 5 cmH20 Plateau Pressure:  [14 cmH20-16 cmH20] 16 cmH20   INTAKE / OUTPUT: Intake/Output      01/12 0701 - 01/13 0700 01/13 0701 - 01/14 0700   I.V. (mL/kg) 1836 (13.8) 158.5 (1.2)   Other 120    NG/GT     IV Piggyback 1260 50   Total Intake(mL/kg) 3216 (24.1) 208.5 (1.6)   Urine (mL/kg/hr) 1890 (0.6)    Emesis/NG output 400 (0.1)    Drains     Stool 10 (0)    Total Output 2300     Net +916 +208.5          PHYSICAL EXAMINATION: General:  Obese  male in NAD on vent  Neuro:  Sedated, periods of intermittent agitation, mittens in place HEENT:  Sclera anicteric, conjunctiva pink, ETT present Neck: Obese, no obvious JVD or LAN Cardiovascular:  s1s2 RRR Lungs:  Even/non-labored, lungs bilaterally clear Abdomen:  Obese, mild distention, lower abd VAC, LLQ colostomy with dusky stoma, mild blood tinged secretions in bag Musculoskeletal:  No acute deformities, moving all extremities spontaneously  Skin:  Intact  LABS:  CBC  Recent Labs Lab 10/28/14 2327 10/29/14 0550 10/30/14 0345  WBC 13.3* 12.7* 9.1  HGB 13.3 13.3 11.5*  HCT 40.8 41.5 35.5*  PLT 186 167 159   Coag's  Recent Labs Lab 10/29/14 0550  INR 1.23   BMET  Recent Labs Lab 10/28/14 0555 10/29/14 0550 10/29/14 1350 10/30/14 0345  NA 134* 141  --  133*  K 4.8 6.1* 3.9 3.5  CL 106 104  --  103  CO2 21 28  --  27  BUN 21 19  --  14  CREATININE 1.17 1.03  --  1.02  GLUCOSE 150* 138*  --  217*   Electrolytes  Recent Labs Lab 10/28/14 0555 10/29/14 0550 10/30/14 0345  CALCIUM 7.4* 7.7* 7.3*  MG  --  2.1 1.9  PHOS  --  2.6 1.7*   Sepsis Markers  Recent Labs Lab 10/28/14 1230 10/28/14 1800 10/29/14 0550  LATICACIDVEN 2.4* 1.9 1.5   ABG  Recent Labs Lab 10/28/14 0704  PHART 7.317*  PCO2ART 42.4  PO2ART 99.1   Liver Enzymes  Recent Labs Lab 10/27/14 1810 10/28/14 0555 10/29/14 0550  AST ALT ALKPHOS 53 33* 36*  BILITOT 0.7 0.8 0.9  ALBUMIN 4.6 2.9* 2.8*   Cardiac Enzymes  Recent Labs Lab 10/28/14 1230 10/28/14 1800 10/29/14 0550  TROPONINI <0.03 <0.03 <0.03   Glucose No results for input(s): GLUCAP in the last 168 hours.  Imaging Dg Chest Port 1 View  10/30/2014   CLINICAL DATA:  Respiratory failure.  Intubated patient.  EXAM: PORTABLE CHEST - 1 VIEW  COMPARISON:  Single view of the chest 10/27/2014, 10/28/2014 and 10/29/2014.  FINDINGS: Endotracheal tube and NG tube remain in place in good  position. Bilateral effusions and basilar airspace disease have worsened. There is marked cardiomegaly. No pneumothorax is identified.  IMPRESSION: Worsened bilateral effusions and basilar airspace disease.   Electronically Signed   By: Drusilla Kanner M.D.   On: 10/30/2014 07:31   Dg Chest Port 1 View  10/29/2014   CLINICAL DATA:  Intubation.  EXAM: PORTABLE CHEST - 1 VIEW  COMPARISON:  10/28/2014 P  FINDINGS: Endotracheal tube and NG tube in stable position. Stable cardiomegaly with normal pulmonary vascularity. Persistent bibasilar pulmonary infiltrates are present. Small pleural effusions cannot be excluded. No pneumothorax.  IMPRESSION: 1.  Lines and tubes in stable position.  2. Persistent bibasilar pulmonary infiltrates. Bilateral small pleural effusions are most likely present .   Electronically Signed   By: Maisie Fus  Register   On: 10/29/2014 07:09    ASSESSMENT / PLAN:  Active Problems:   Diverticulitis of colon with perforation   Shock circulatory   Acute respiratory failure with hypoxia   Acute post-operative pain   Solitary pulmonary nodule on lung CT   Sleep apnea   PULMONARY A: Acute Hypoxic Resp Failure - improving O2 needs, down to 40% 1/12 OSA on CPAP 8 mm RLL Nodule P:   Lung protective ventilation SBT/WUA as tolerated  Will need follow-up CTs as OP for nodule CPAP post extubation in setting of OSA   CARDIOVASCULAR ETT 1/10 >> L femoral CVC 1/11 >> Art Line to be placed 1/11 >> A: Circulatory Shock - secondary to perforation. S/p ~ 5.7 L fluid resuscitation. Resolved, off pressors 1/13 NSTEMI - likely demand ischemia HTN P:   Negative lactate / cortisol reassuring  Follow hemodynamics  Lasix 20 mg IV x1 with KCL  RENAL A: Hyperkalemia  Hyponatremia - Likely 2/2 mild dehydration. Lactic Acidosis  P:   Trend BMP Replace electrolytes as indicated  GASTROINTESTINAL A: Perforated Sigmoid s/p Partial Colectomy (1/10) Nutrition  P:   Management per  surgery Defer feeding to CCS NPO PPI  HEMATOLOGIC A: Bleeding - mild ooze from surgical sites, slowing.  No change in H/H.  CCS aware.  P: CBC daily  SCD's   INFECTIOUS Abscess culture 1/11 >>  Blood culture 1/11 >> Urine culture 1/11 >>neg A: Intraabdominal abscess (Sigmoid Diverticulitis) s/p Perforation  P:   Follow-up cultures Vanc 1/10, D4/x Zosyn 1/10, D4/x Micafungin 1/10, D4/x   ENDOCRINE A: Hyperglycemia Hypothyroidism  P:   SSI  Continue synthroid IV  NEUROLOGIC A: Post Operative Pain - s/p partial colectomy  P: Diprivan gtt Fent gtt  RASS Goal:  -2   Social / Family: Wife updated at bedside 1/13.     Canary BrimBrandi Sabrena Gavitt, NP-C [facility] Pgr: [phone] or [phone]    10/30/2014 11:34 AM

## 2014-10-30 NOTE — Progress Notes (Signed)
ANTIBIOTIC CONSULT NOTE - FOLLOW UP  Pharmacy Consult for Vancomycin, Zosyn Indication: Sepsis secondary to intraabdominal abscess  Allergies  Allergen Reactions  . Aloprim [Allopurinol] Other (See Comments)    Unknown reaction  . Simvastatin Other (See Comments)    Unknown reaction    Patient Measurements: Height: 5' 11.5" (181.6 cm) Weight: 293 lb 10.4 oz (133.2 kg) IBW/kg (Calculated) : 76.45  Vital Signs: Temp: 99.1 F (37.3 C) (01/13 1158) Temp Source: Axillary (01/13 1158) BP: 111/58 mmHg (01/13 1500) Pulse Rate: 90 (01/13 1500) Intake/Output from previous day: 01/12 0701 - 01/13 0700 In: 3216 [I.V.:1836; IV Piggyback:1260] Out: 2300 [Urine:1890; Emesis/NG output:400; Stool:10] Intake/Output from this shift: Total I/O In: 506.5 [I.V.:356.5; IV Piggyback:150] Out: 850 [Urine:850]  Labs:  Recent Labs  10/28/14 0555  10/28/14 2327 10/29/14 0550 10/30/14 0345  WBC 14.0*  < > 13.3* 12.7* 9.1  HGB 13.8  < > 13.3 13.3 11.5*  PLT 176  < > 186 167 159  CREATININE 1.17  --   --  1.03 1.02  < > = values in this interval not displayed. Estimated Creatinine Clearance: 106.7 mL/min (by C-G formula based on Cr of 1.02).  Recent Labs  10/30/14 1520  VANCOTROUGH 8.5*     Microbiology: Recent Results (from the past 720 hour(s))  Anaerobic culture     Status: None (Preliminary result)   Collection Time: 10/28/14  2:59 AM  Result Value Ref Range Status   Specimen Description ABDOMEN  Final   Special Requests NONE  Final   Gram Stain PENDING  Incomplete   Culture   Final    NO ANAEROBES ISOLATED; CULTURE IN PROGRESS FOR 5 DAYS Performed at [facility]    Report Status PENDING  Incomplete  Culture, routine-abscess     Status: None (Preliminary result)   Collection Time: 10/28/14  2:59 AM  Result Value Ref Range Status   Specimen Description ABDOMEN  Final   Special Requests NONE  Final   Gram Stain   Final    FEW WBC PRESENT,BOTH PMN AND  MONONUCLEAR NO SQUAMOUS EPITHELIAL CELLS SEEN NO ORGANISMS SEEN Performed at [facility]    Culture   Final    NO GROWTH 2 DAYS Performed at [facility]    Report Status PENDING  Incomplete  Culture, blood (routine x 2)     Status: None (Preliminary result)   Collection Time: 10/28/14  5:55 AM  Result Value Ref Range Status   Specimen Description BLOOD RIGHT ANTECUBITAL  Final   Special Requests BOTTLES DRAWN AEROBIC AND ANAEROBIC 10CC  Final   Culture   Final           BLOOD CULTURE RECEIVED NO GROWTH TO DATE CULTURE WILL BE HELD FOR 5 DAYS BEFORE ISSUING A FINAL NEGATIVE REPORT Note: Culture results may be compromised due to an excessive volume of blood received in culture bottles. Performed at [facility]    Report Status PENDING  Incomplete  Culture, blood (routine x 2)     Status: None (Preliminary result)   Collection Time: 10/28/14  6:00 AM  Result Value Ref Range Status   Specimen Description BLOOD RIGHT ARM  Final   Special Requests BOTTLES DRAWN AEROBIC AND ANAEROBIC 5CC  Final   Culture   Final           BLOOD CULTURE RECEIVED NO GROWTH TO DATE CULTURE WILL BE HELD FOR 5 DAYS BEFORE ISSUING A FINAL NEGATIVE REPORT Performed at [facility]    Report Status PENDING  Incomplete  MRSA PCR Screening     Status: None   Collection Time: 10/28/14  8:55 AM  Result Value Ref Range Status   MRSA by PCR NEGATIVE NEGATIVE Final    Comment:        The GeneXpert MRSA Assay (FDA approved for NASAL specimens only), is one component of a comprehensive MRSA colonization surveillance program. It is not intended to diagnose MRSA infection nor to guide or monitor treatment for MRSA infections.   Culture, Urine     Status: None   Collection Time: 10/28/14  9:40 AM  Result Value Ref Range Status   Specimen Description URINE, CATHETERIZED  Final   Special Requests NONE  Final   Colony Count NO GROWTH [facility]   Final    Culture NO GROWTH [facility] at [facility]   Final   Report Status 10/29/2014 FINAL  Final    Assessment: [id number] y/oM with HTN and nephrolithiasis who [facility] with abdominal pain of 1 day duration secondary to a perforated sigmoid. He was taken emergently to OR on 1/10 and is now s/p partial colectomy. Pharmacy consulted to assist with dosing of Vancomycin and Zosyn for sepsis secondary to intraabdominal abscess. Patient is also on Micafungin per MD for same.  1/11 >> Zosyn >> 1/11 >> Vancomycin >> 1/11 >> Micafungin >>  Tmax: 100.2 WBCs: improved to WNL Renal: SCr WNL,  CrCl =  76 mL/min Normalized  1/11 blood x 2: NGTD 1/11 urine: NG 1/11 abscess: NGTD 1/11 MRSA PCR: negative  Dose changes/drug level info:  1/11: Changed Vancomycin to 1g IV q12h per obesity nomogram/Normalized CrCl 1/13 1500 vanco trough = 8.5 mcg/ml on 1gm IV q12h - prior to 5th maintenance dose (drawn 1h past due time of 14:00)  Goal of Therapy:  Vancomycin trough level 15-20 mcg/ml  Appropriate antibiotic dosing for renal function and indication Eradication of infection  Plan:   Increase vancomycin to 1000 mg IV q8h, to start at 2200.  Trough was drawn 1 hr late but still relatively certain true trough is < 10 mg/dL.  However, given this patient's risk for accumulation due to body habitus, will limit increase to 1.5x previous dose.  Will check trough again with AM labs on 1/15 to see if further increase is needed  Continue other antibiotics as ordered  Bernadene Personrew Zalen Sequeira, PharmD Pager: [phone] 10/30/2014, 5:49 PM

## 2014-10-30 NOTE — Progress Notes (Signed)
NUTRITION FOLLOW UP/TPN Consult  Intervention:   -TPN per pharmacy; monitor phosphorus and CBG with initiation of TPN; replete Phos as warranted -Consider modifying TPN lipid regimen d/t high rate of propofol -Diet advancement per MD -RD to continue to monitor  Nutrition Dx:   Inadequate oral intake related to inability to eat as evidenced by NPO; ongoing  Goal:   Parenteral nutrition to provide 60-70% of estimated calorie needs (22-25 kcals/kg ideal body weight) and 100% of estimated protein needs, based on ASPEN guidelines for hypocaloric, high protein feeding in critically ill obese individuals   Monitor:   TPN tolerance, GI profile, weight trend,, vent status/settings, labs  Assessment:   1/11: Pt s/p partial colectomy 10/27/14.  - Pt with bleeding from surgical sites.  - Spoke with pt's wife who reported a recent increase in pt's weight (30 lbs in 2-3 months) due to "stress eating." She says that pt will often eat lunch that she packs for him at work as well as food provided to him. She reports an increase in the consumption of starchy foods such as potato chips and breads.   1/13: -Pt remains intubated and sedated, gradually wean of vent and pressors d/t ARDs and obesity/distension per MD note -RD consulted for initiation of TPN  -Wt increased 13 lbs since 1/11; likely d/t 5.7L fluid resuscitation; currently +800 ml output -WOC following for ostomy care -Monitor triglycerides with propofol, may need to administer TPN lipids 2-3x/week vs every day -CBG elevated > 150 mg/dL, monitor with initiation of TPN -K elevated at 6.1, now WNL -Low Phos, being repleted with KPhos  Patient is currently intubated on ventilator support MV: 10 L/min Temp (24hrs), Avg:99.8 F (37.7 C), Min:99.1 F (37.3 C), Max:100.8 F (38.2 C)  Propofol: 38.1 ml/hr; providing 1005 lipid kcal/daily    Height: Ht Readings from Last 1 Encounters:  10/28/14 5' 11.5" (1.816 m)    Weight Status:    Wt Readings from Last 1 Encounters:  10/30/14 293 lb 10.4 oz (133.2 kg)  10/27/14 280 lb  Re-estimated needs:  Kcal: 2304 (1800-2000 kcal = 22-25 kcal/kg IBW) Protein:~160 gram protein Fluid: per MD  Skin:  Surgical incision on abd, +1 generalized edema  Diet Order: Diet NPO time specified Except for: Ice Chips   Intake/Output Summary (Last 24 hours) at 10/30/14 0845 Last data filed at 10/30/14 0700  Gross per 24 hour  Intake 2848.79 ml  Output   2050 ml  Net 798.79 ml    Last BM: 1/11   Labs:   Recent Labs Lab 10/28/14 0555 10/29/14 0550 10/29/14 1350 10/30/14 0345  NA 134* 141  --  133*  K 4.8 6.1* 3.9 3.5  CL 106 104  --  103  CO2 21 28  --  27  BUN 21 19  --  14  CREATININE 1.17 1.03  --  1.02  CALCIUM 7.4* 7.7*  --  7.3*  MG  --  2.1  --  1.9  PHOS  --  2.6  --  1.7*  GLUCOSE 150* 138*  --  217*    CBG (last 3)  No results for input(s): GLUCAP in the last 72 hours.  Scheduled Meds: . antiseptic oral rinse  7 mL Mouth Rinse QID  . chlorhexidine  15 mL Mouth Rinse BID  . heparin  5,000 Units Subcutaneous 3 times per day  . levothyroxine  75 mcg Intravenous Daily  . lip balm  1 application Topical BID  . micafungin [facility](MYCAMINE) IV  100 mg Intravenous Q0600  . pantoprazole (PROTONIX) IV  40 mg Intravenous QHS  . piperacillin-tazobactam (ZOSYN)  IV  3.375 g Intravenous 3 times per day  . pneumococcal 23 valent vaccine  0.5 mL Intramuscular Tomorrow-1000  . potassium phosphate IVPB (mmol)  30 mmol Intravenous Once  . vancomycin  1,000 mg Intravenous Q12H    Continuous Infusions: . fentaNYL infusion INTRAVENOUS 75 mcg/hr (10/29/14 1900)  . norepinephrine (LEVOPHED) Adult infusion 2 mcg/min (10/30/14 0641)  . propofol 50 mcg/kg/min (10/30/14 4098)    Lloyd Huger MS RD LDN Clinical Dietitian Pager:[phone]

## 2014-10-30 NOTE — Progress Notes (Signed)
PT failed SBT (5/5) due to RR greater than 35, HR greater than 130, high anxiety. RN and PA aware. 

## 2014-10-30 NOTE — Progress Notes (Signed)
PARENTERAL NUTRITION CONSULT NOTE - INITIAL  Pharmacy Consult for TPN Indication: Prolonged ileus  Allergies  Allergen Reactions  . Aloprim [Allopurinol] Other (See Comments)    Unknown reaction  . Simvastatin Other (See Comments)    Unknown reaction    Patient Measurements: Height: 5' 11.5" (181.6 cm) Weight: 293 lb 10.4 oz (133.2 kg) IBW/kg (Calculated) : 76.45 Adjusted Body Weight: 99kg Usual Weight: 114kg  Vital Signs: Temp: 99.6 F (37.6 C) (01/13 0800) Temp Source: Oral (01/13 0800) BP: 103/59 mmHg (01/13 0700) Pulse Rate: 86 (01/13 0700) Intake/Output from previous day: 01/12 0701 - 01/13 0700 In: 3216 [I.V.:1836; IV Piggyback:1260] Out: 2300 [Urine:1890; Emesis/NG output:400; Stool:10] Intake/Output from this shift:    Labs:  Recent Labs  10/28/14 2327 10/29/14 0550 10/30/14 0345  WBC 13.3* 12.7* 9.1  HGB 13.3 13.3 11.5*  HCT 40.8 41.5 35.5*  PLT 186 167 159  INR  --  1.23  --      Recent Labs  10/27/14 1810 10/28/14 0555 10/29/14 0550 10/29/14 1350 10/30/14 0345  NA 134* 134* 141  --  133*  K 4.0 4.8 6.1* 3.9 3.5  CL 99 106 104  --  103  CO2 24 21 28   --  27  GLUCOSE 170* 150* 138*  --  217*  BUN 23 21 19   --  14  CREATININE 1.17 1.17 1.03  --  1.02  CALCIUM 9.4 7.4* 7.7*  --  7.3*  MG  --   --  2.1  --  1.9  PHOS  --   --  2.6  --  1.7*  PROT 6.8 4.8* 5.4*  --   --   ALBUMIN 4.6 2.9* 2.8*  --   --   AST 29 26 19   --   --   ALT 24 23 21   --   --   ALKPHOS 53 33* 36*  --   --   BILITOT 0.7 0.8 0.9  --   --   TRIG  --  109  --   --   --    Estimated Creatinine Clearance: 106.7 mL/min (by C-G formula based on Cr of 1.02).   No results for input(s): GLUCAP in the last 72 hours.  Medical History: Past Medical History  Diagnosis Date  . Renal disorder     kidney stones  . Hypertension   . Thyroid disease     Insulin Requirements: none currently ordered  Current Nutrition: NPO  IVF: KVO  Central access: CVC (triple lumen),  since femoral plan to change to PICC 1/13 TPN start date: 1/13  ASSESSMENT                                                                                                          HPI: [id number] y/oM with HTN and nephrolithiasis who [facility] with abdominal pain of 1 day duration secondary to a perforated sigmoid d/t diverticulitis. He was taken emergently to OR on 1/10 and is now s/p partial colectomy. Orders to start TPN for prolonged ileus  1/13.  Significant events:  1/13: VDRF post-op remains on propofol (high needs) and pressor support with norepinephrine  Today:   Glucose -  Elevated on morning labs  Electrolytes - Na = 133, K = 3.5, phos = 1.7 - ordered KPhos. Ca = 7.3 (corrects to 8.3 - low end normal)  Renal - SCr WNL  I/O = + , NGT output =  LFTs - WNL 1/12  TGs - Currently ordered trigs q72h while on propogol  Prealbumin - Order for am  NUTRITIONAL GOALS                                                                                             RD recs: 150-160gm protein, 1800-2000kcal Clinimix 5/15 at a goal rate of 147ml/hr + 20% fat emulsion at 80ml/hr on MWF to provide: 144 g/day protein, avg of 2276Kcal/day.  PLAN                                                                                                                         At 1800 today:  Start Clinimix E5/15 at 45ml/hr (~50% of goal rate)  No lipids as currently on high rate of propofol, at current rate providing 1005kcals  Once appropriate to start lipid emulsion will do max MWF d/t national backorder  Plan to advance as tolerated to the goal rate.  TPN to contain standard multivitamins and trace elements.  Add moderate SSI/CBGs .   TPN lab panels on Mondays & Thursdays.  F/u daily.  Watch electrolytes closely  Juliette Alcide, PharmD, BCPS.   Pager: [phone]  10/30/2014,9:37 AM

## 2014-10-30 NOTE — Progress Notes (Signed)
ANTIBIOTIC CONSULT NOTE - FOLLOW UP  Pharmacy Consult for Vancomycin, Zosyn Indication: Sepsis secondary to intraabdominal abscess  Allergies  Allergen Reactions  . Aloprim [Allopurinol] Other (See Comments)    Unknown reaction  . Simvastatin Other (See Comments)    Unknown reaction    Patient Measurements: Height: 5' 11.5" (181.6 cm) Weight: 293 lb 10.4 oz (133.2 kg) IBW/kg (Calculated) : 76.45  Vital Signs: Temp: 99.1 F (37.3 C) (01/13 1158) Temp Source: Axillary (01/13 1158) BP: 115/63 mmHg (01/13 1200) Pulse Rate: 100 (01/13 1200) Intake/Output from previous day: 01/12 0701 - 01/13 0700 In: 3216 [I.V.:1836; IV Piggyback:1260] Out: 2300 [Urine:1890; Emesis/NG output:400; Stool:10] Intake/Output from this shift: Total I/O In: 319.7 [I.V.:219.7; IV Piggyback:100] Out: 850 [Urine:850]  Labs:  Recent Labs  10/28/14 0555  10/28/14 2327 10/29/14 0550 10/30/14 0345  WBC 14.0*  < > 13.3* 12.7* 9.1  HGB 13.8  < > 13.3 13.3 11.5*  PLT 176  < > 186 167 159  CREATININE 1.17  --   --  1.03 1.02  < > = values in this interval not displayed. Estimated Creatinine Clearance: 106.7 mL/min (by C-G formula based on Cr of 1.02). No results for input(s): VANCOTROUGH, VANCOPEAK, VANCORANDOM, GENTTROUGH, GENTPEAK, GENTRANDOM, TOBRATROUGH, TOBRAPEAK, TOBRARND, AMIKACINPEAK, AMIKACINTROU, AMIKACIN in the last 72 hours.   Microbiology: Recent Results (from the past 720 hour(s))  Anaerobic culture     Status: None (Preliminary result)   Collection Time: 10/28/14  2:59 AM  Result Value Ref Range Status   Specimen Description ABDOMEN  Final   Special Requests NONE  Final   Gram Stain PENDING  Incomplete   Culture   Final    NO ANAEROBES ISOLATED; CULTURE IN PROGRESS FOR 5 DAYS Performed at [facility]    Report Status PENDING  Incomplete  Culture, routine-abscess     Status: None (Preliminary result)   Collection Time: 10/28/14  2:59 AM  Result Value Ref Range Status   Specimen Description ABDOMEN  Final   Special Requests NONE  Final   Gram Stain   Final    FEW WBC PRESENT,BOTH PMN AND MONONUCLEAR NO SQUAMOUS EPITHELIAL CELLS SEEN NO ORGANISMS SEEN Performed at [facility]    Culture   Final    NO GROWTH 2 DAYS Performed at [facility]    Report Status PENDING  Incomplete  Culture, blood (routine x 2)     Status: None (Preliminary result)   Collection Time: 10/28/14  5:55 AM  Result Value Ref Range Status   Specimen Description BLOOD RIGHT ANTECUBITAL  Final   Special Requests BOTTLES DRAWN AEROBIC AND ANAEROBIC 10CC  Final   Culture   Final           BLOOD CULTURE RECEIVED NO GROWTH TO DATE CULTURE WILL BE HELD FOR 5 DAYS BEFORE ISSUING A FINAL NEGATIVE REPORT Note: Culture results may be compromised due to an excessive volume of blood received in culture bottles. Performed at [facility]    Report Status PENDING  Incomplete  Culture, blood (routine x 2)     Status: None (Preliminary result)   Collection Time: 10/28/14  6:00 AM  Result Value Ref Range Status   Specimen Description BLOOD RIGHT ARM  Final   Special Requests BOTTLES DRAWN AEROBIC AND ANAEROBIC 5CC  Final   Culture   Final           BLOOD CULTURE RECEIVED NO GROWTH TO DATE CULTURE WILL BE HELD FOR 5 DAYS BEFORE  ISSUING A FINAL NEGATIVE REPORT Performed at [facility]    Report Status PENDING  Incomplete  MRSA PCR Screening     Status: None   Collection Time: 10/28/14  8:55 AM  Result Value Ref Range Status   MRSA by PCR NEGATIVE NEGATIVE Final    Comment:        The GeneXpert MRSA Assay (FDA approved for NASAL specimens only), is one component of a comprehensive MRSA colonization surveillance program. It is not intended to diagnose MRSA infection nor to guide or monitor treatment for MRSA infections.   Culture, Urine     Status: None   Collection Time: 10/28/14  9:40 AM  Result Value Ref Range Status   Specimen Description URINE,  CATHETERIZED  Final   Special Requests NONE  Final   Colony Count NO GROWTH [facility] at [facility]   Final   Culture NO GROWTH [facility]formed at [facility]   Final   Report Status 10/29/2014 FINAL  Final    Assessment: William [name] y/oM with HTN and nephrolithiasis who [facility] with abdominal pain of 1 day duration secondary to a perforated sigmoid. He was taken emergently to OR on 1/10 and is now s/p partial colectomy. Pharmacy consulted to assist with dosing of Vancomycin and Zosyn for sepsis secondary to intraabdominal abscess. Patient is also on Micafungin per MD.  1/11 >> Zosyn >> 1/11 >> Vancomycin >> 1/11 >> Micafungin >>  Tmax: 100.2 WBCs: improved to WNL Renal: SCr WNL,  CrCl =  76 mL/min Normalized  1/11 blood x 2: NGTD 1/11 urine: NG 1/11 abscess: NGTD 1/11 MRSA PCR: negative  Dose changes/drug level info:  1/11: Changed Vancomycin to 1g IV q12h per obesity nomogram/Normalized CrCl 1/13 1500 vanco trough =  ____mcg/ml on 1gm IV q12h - prior to 5th maintenance dose (drawn 1h past due time of 14:00)  Goal of Therapy:  Vancomycin trough level 15-20 mcg/ml  Appropriate antibiotic dosing for renal function and indication Eradication of infection  Plan:  Day #3 vanco/zosyn/micafungin  Continue Vancomycin to 1 gram IV q12h - trough for today is pending  Continue Zosyn 3.375g IV q8h (infuse over 4 hours)  Continue Micafungin 100mg  IV q24h per MD.  Monitor renal function, cultures, clinical course.  Juliette Alcideustin Jamel Dunton, PharmD, BCPS.   Pager: [phone] 10/30/2014 3:01 PM

## 2014-10-31 ENCOUNTER — Inpatient Hospital Stay (HOSPITAL_COMMUNITY): Payer: 59

## 2014-10-31 DIAGNOSIS — K5720 Diverticulitis of large intestine with perforation and abscess without bleeding: Principal | ICD-10-CM

## 2014-10-31 LAB — CULTURE, ROUTINE-ABSCESS: CULTURE: NO GROWTH

## 2014-10-31 LAB — GLUCOSE, CAPILLARY
GLUCOSE-CAPILLARY: 139 mg/dL — AB (ref 70–99)
GLUCOSE-CAPILLARY: 140 mg/dL — AB (ref 70–99)
Glucose-Capillary: 139 mg/dL — ABNORMAL HIGH (ref 70–99)
Glucose-Capillary: 137 mg/dL — ABNORMAL HIGH (ref 70–99)
Glucose-Capillary: 133 mg/dL — ABNORMAL HIGH (ref 70–99)

## 2014-10-31 LAB — CBC
HCT: 35 % — ABNORMAL LOW (ref 39.0–52.0)
Hemoglobin: 11.2 g/dL — ABNORMAL LOW (ref 13.0–17.0)
MCH: 31.5 pg (ref 26.0–34.0)
MCHC: 32 g/dL (ref 30.0–36.0)
MCV: 98.6 fL (ref 78.0–100.0)
PLATELETS: 179 10*3/uL (ref 150–400)
RBC: 3.55 MIL/uL — ABNORMAL LOW (ref 4.22–5.81)
RDW: 14.2 % (ref 11.5–15.5)
WBC: 8.7 10*3/uL (ref 4.0–10.5)

## 2014-10-31 LAB — COMPREHENSIVE METABOLIC PANEL
ALK PHOS: 36 U/L — AB (ref 39–117)
ALT: 16 U/L (ref 0–53)
ANION GAP: 6 (ref 5–15)
AST: 18 U/L (ref 0–37)
Albumin: 2.6 g/dL — ABNORMAL LOW (ref 3.5–5.2)
BILIRUBIN TOTAL: 0.7 mg/dL (ref 0.3–1.2)
BUN: 13 mg/dL (ref 6–23)
CALCIUM: 7.5 mg/dL — AB (ref 8.4–10.5)
CHLORIDE: 98 meq/L (ref 96–112)
CO2: 32 mmol/L (ref 19–32)
Creatinine, Ser: 1.08 mg/dL (ref 0.50–1.35)
GFR calc non Af Amer: 72 mL/min — ABNORMAL LOW (ref >90)
GFR, EST AFRICAN AMERICAN: 84 mL/min — AB (ref >90)
Glucose, Bld: 146 mg/dL — ABNORMAL HIGH (ref 70–99)
POTASSIUM: 3.5 mmol/L (ref 3.5–5.1)
Sodium: 136 mmol/L (ref 135–145)
Total Protein: 5.6 g/dL — ABNORMAL LOW (ref 6.0–8.3)

## 2014-10-31 LAB — PHOSPHORUS: PHOSPHORUS: 3.4 mg/dL (ref 2.3–4.6)

## 2014-10-31 LAB — PREALBUMIN: Prealbumin: 9.7 mg/dL — ABNORMAL LOW (ref 17.0–34.0)

## 2014-10-31 LAB — MAGNESIUM: Magnesium: 2.2 mg/dL (ref 1.5–2.5)

## 2014-10-31 LAB — TRIGLYCERIDES: Triglycerides: 270 mg/dL — ABNORMAL HIGH (ref <150)

## 2014-10-31 MED ORDER — POTASSIUM CHLORIDE 10 MEQ/50ML IV SOLN
10.0000 meq | INTRAVENOUS | Status: AC
  Administered 2014-10-31 (×4): 10 meq via INTRAVENOUS
  Filled 2014-10-31 (×4): qty 50

## 2014-10-31 MED ORDER — TRACE MINERALS CR-CU-F-FE-I-MN-MO-SE-ZN IV SOLN
INTRAVENOUS | Status: AC
  Administered 2014-10-31: 17:00:00 via INTRAVENOUS
  Filled 2014-10-31: qty 1920

## 2014-10-31 MED ORDER — POTASSIUM CHLORIDE 10 MEQ/50ML IV SOLN
10.0000 meq | INTRAVENOUS | Status: AC
  Administered 2014-10-31 – 2014-11-01 (×4): 10 meq via INTRAVENOUS
  Filled 2014-10-31 (×4): qty 50

## 2014-10-31 NOTE — Progress Notes (Signed)
PULMONARY / CRITICAL CARE MEDICINE HISTORY AND PHYSICAL EXAMINATION   Name: William [name] MRN: [id number] DOB: May 10, 1951    ADMISSION DATE:  10/27/2014  PRIMARY SERVICE: PCCM  CHIEF COMPLAINT:  Perforated sigmoid colon  BRIEF PATIENT DESCRIPTION:  64 y/o M with HTN and nephrolithiasis who presented to [facility] on 1/10 with abdominal pain of 1 day duration. He was found to have a perforated sigmoid colon and was planned to be managed conservatively but became hypotensive and required urgent intervention 1/10 with a partial colectomy.   SIGNIFICANT EVENTS / STUDIES:  1/10  Admit, Partial colectomy  1/11  70% FiO2 / PEEP 12, mild bleeding from surgical site 1/11  ECHO >> EF 55-60%, nml systolic fxn, PA peak 32 1/12  Improved O2 needs, vasopressor need reduced.  1/14  On TNA, failed WUA with agitation   SUBJECTIVE: VSS.  Failed WUA with significant agitation.    VITAL SIGNS: Temp:  [98.7 F (37.1 C)-99.9 F (37.7 C)] 99.9 F (37.7 C) (01/14 0800) Pulse Rate:  [84-101] 84 (01/14 1100) Resp:  [6-46] 15 (01/14 1100) BP: (89-134)/(51-92) 104/59 mmHg (01/14 1100) SpO2:  [91 %-100 %] 92 % (01/14 1100) FiO2 (%):  [40 %-70 %] 40 % (01/14 1114)   HEMODYNAMICS:     VENTILATOR SETTINGS: Vent Mode:  [-] PRVC FiO2 (%):  [40 %-70 %] 40 % Set Rate:  [14 bmp] 14 bmp Vt Set:  [602 mL-610 mL] 610 mL PEEP:  [8 cmH20-10 cmH20] 8 cmH20 Plateau Pressure:  [15 cmH20-19 cmH20] 17 cmH20   INTAKE / OUTPUT: Intake/Output      01/13 0701 - 01/14 0700 01/14 0701 - 01/15 0700   I.V. (mL/kg) 1313.4 (9.9) 164.6 (1.2)   Other     IV Piggyback 750 100   TPN 748 240   Total Intake(mL/kg) 2811.4 (21.1) 504.6 (3.8)   Urine (mL/kg/hr) 3305 (1) 990 (1.3)   Emesis/NG output 350 (0.1)    Drains 0 (0)    Stool     Total Output 3655 990   Net -843.6 -485.4          PHYSICAL EXAMINATION: General:  Obese male in NAD on vent  Neuro:  Sedated, periods of intermittent agitation HEENT:  Sclera anicteric,  conjunctiva pink, ETT present Neck: Obese, no obvious JVD or LAN Cardiovascular:  s1s2 RRR Lungs:  Even/non-labored, lungs bilaterally clear Abdomen:  Obese, lower abd VAC, LLQ colostomy, mild blood tinged secretions in bag Musculoskeletal:  No acute deformities, moving all extremities spontaneously  Skin:  Intact  LABS:  CBC  Recent Labs Lab 10/29/14 0550 10/30/14 0345 10/31/14 0300  WBC 12.7* 9.1 8.7  HGB 13.3 11.5* 11.2*  HCT 41.5 35.5* 35.0*  PLT 167 159 179   Coag's  Recent Labs Lab 10/29/14 0550  INR 1.23   BMET  Recent Labs Lab 10/29/14 0550 10/29/14 1350 10/30/14 0345 10/31/14 0300  NA 141  --  133* 136  K 6.1* 3.9 3.5 3.5  CL 104  --  103 98  CO2 28  --  27 32  BUN 19  --  14 13  CREATININE 1.03  --  1.02 1.08  GLUCOSE 138*  --  217* 146*   Electrolytes  Recent Labs Lab 10/29/14 0550 10/30/14 0345 10/31/14 0300  CALCIUM 7.7* 7.3* 7.5*  MG 2.1 1.9 2.2  PHOS 2.6 1.7* 3.4   Sepsis Markers  Recent Labs Lab 10/28/14 1230 10/28/14 1800 10/29/14 0550  LATICACIDVEN 2.4* 1.9 1.5   ABG  Recent  Labs Lab 10/28/14 0704  PHART 7.317*  PCO2ART 42.4  PO2ART 99.1   Liver Enzymes  Recent Labs Lab 10/28/14 0555 10/29/14 0550 10/31/14 0300  AST ALT ALKPHOS 33* 36* 36*  BILITOT 0.8 0.9 0.7  ALBUMIN 2.9* 2.8* 2.6*   Cardiac Enzymes  Recent Labs Lab 10/28/14 1230 10/28/14 1800 10/29/14 0550  TROPONINI <0.03 <0.03 <0.03   Glucose  Recent Labs Lab 10/30/14 1816 10/31/14 0019 10/31/14 0517  GLUCAP 91 139* 139*    Imaging Dg Chest Port 1 View  10/31/2014   CLINICAL DATA:  Respiratory failure.  EXAM: PORTABLE CHEST - 1 VIEW  COMPARISON:  10/30/2014.  FINDINGS: Interim placement right PICC line, its tip is at the cavoatrial junction. Endotracheal tube, NG tube in stable position. Stable cardiomegaly. Stable bibasilar pulmonary infiltrates and small bilateral pleural effusions. No pneumothorax.  IMPRESSION: 1.  Interval placement of PICC line comes to the cavoatrial junction. Endotracheal tube and NG tube in stable position. 2. Bibasilar infiltrates and small pleural effusions again noted and unchanged. 3. Stable cardiomegaly.   Electronically Signed   By: Maisie Fus  Register   On: 10/31/2014 07:24   Dg Chest Port 1 View  10/30/2014   CLINICAL DATA:  Respiratory failure.  Intubated patient.  EXAM: PORTABLE CHEST - 1 VIEW  COMPARISON:  Single view of the chest 10/27/2014, 10/28/2014 and 10/29/2014.  FINDINGS: Endotracheal tube and NG tube remain in place in good position. Bilateral effusions and basilar airspace disease have worsened. There is marked cardiomegaly. No pneumothorax is identified.  IMPRESSION: Worsened bilateral effusions and basilar airspace disease.   Electronically Signed   By: Drusilla Kanner M.D.   On: 10/30/2014 07:31    ASSESSMENT / PLAN:  Principal Problem:   Diverticulitis of colon with perforation s/p colectomy/colostomy 10/28/2014 Active Problems:   Shock circulatory   Acute respiratory failure with hypoxia   Acute post-operative pain   Solitary pulmonary nodule on lung CT   Sleep apnea   PULMONARY A: Acute Hypoxic Resp Failure - improving O2 needs, down to 40% 1/12 Small Bilateral Pleural Effusions OSA on CPAP 8 mm RLL Nodule P:   Lung protective ventilation SBT/WUA daily  Trend CXR  Wean PEEP/FiO2 for sats 90-95% Will need follow-up CTs as OP for nodule CPAP post extubation in setting of OSA   CARDIOVASCULAR ETT 1/10 >> L femoral CVC 1/11 >> Art Line to be placed 1/11 >> RUE PICC 1/13 >>  A: Circulatory Shock - secondary to perforation. S/p ~ 5.7 L fluid resuscitation. Resolved, off pressors 1/13. NSTEMI - likely demand ischemia HTN P:   Follow hemodynamics  Lasix 40 Q8   RENAL A: Hyperkalemia  Hyponatremia - Likely 2/2 mild dehydration. Lactic Acidosis  P:   Trend BMP Replace electrolytes as indicated Lasix as above Additional supplemental K at  2200 with diuresis   GASTROINTESTINAL A: Perforated Sigmoid s/p Partial Colectomy (1/10) Nutrition  P:   Management per surgery Defer feeding to CCS NPO PPI  HEMATOLOGIC A: Bleeding - mild ooze from surgical sites, slowing.  No change in H/H.  CCS aware.  P: CBC daily  SCD's   INFECTIOUS Abscess culture 1/11 >> neg  Blood culture 1/11 >> Urine culture 1/11 >>neg A: Intraabdominal abscess (Sigmoid Diverticulitis) s/p Perforation  P:   Follow-up cultures above Vanc 1/10, D4/x Zosyn 1/10, D4/x Micafungin 1/10, D4/x   ENDOCRINE A: Hyperglycemia Hypothyroidism  P:   SSI  Continue synthroid IV  NEUROLOGIC A:  Post Operative Pain - s/p partial colectomy  P: Diprivan gtt Fent gtt RASS Goal:  -2       Canary BrimBrandi Regino Fournet, NP-C [facility] Pgr: [phone] or [phone]    10/31/2014 12:43 PM

## 2014-10-31 NOTE — Plan of Care (Signed)
Problem: Phase I Progression Outcomes Goal: VTE prophylaxis Outcome: Completed/Met Date Met:  10/31/14 SCD's & SQ Heparin Goal: Oral Care per Protocol Outcome: Completed/Met Date Met:  10/31/14 High Risk Oral care protocol initiated

## 2014-10-31 NOTE — Progress Notes (Addendum)
PARENTERAL NUTRITION CONSULT NOTE   Pharmacy Consult for TPN Indication: Prolonged ileus  Allergies  Allergen Reactions  . Aloprim [Allopurinol] Other (See Comments)    Unknown reaction  . Simvastatin Other (See Comments)    Unknown reaction    Patient Measurements: Height: 5' 11.5" (181.6 cm) Weight: 293 lb 10.4 oz (133.2 kg) IBW/kg (Calculated) : 76.45 Adjusted Body Weight: 99kg Usual Weight: 114kg  Vital Signs: Temp: 99.9 F (37.7 C) (01/14 0800) Temp Source: Oral (01/14 0800) BP: 132/79 mmHg (01/14 0800) Pulse Rate: 92 (01/14 0800) Intake/Output from previous day: 01/13 0701 - 01/14 0700 In: 2705.8 [I.V.:1267.8; IV Piggyback:750; TPN:688] Out: 3655 [Urine:3305; Emesis/NG output:350] Intake/Output from this shift:    Labs:  Recent Labs  10/29/14 0550 10/30/14 0345 10/31/14 0300  WBC 12.7* 9.1 8.7  HGB 13.3 11.5* 11.2*  HCT 41.5 35.5* 35.0*  PLT 167 159 179  INR 1.23  --   --      Recent Labs  10/29/14 0550 10/29/14 1350 10/30/14 0345 10/31/14 0300  NA 141  --  133* 136  K 6.1* 3.9 3.5 3.5  CL 104  --  103 98  CO2 28  --  27 32  GLUCOSE 138*  --  217* 146*  BUN 19  --  14 13  CREATININE 1.03  --  1.02 1.08  CALCIUM 7.7*  --  7.3* 7.5*  MG 2.1  --  1.9 2.2  PHOS 2.6  --  1.7* 3.4  PROT 5.4*  --   --  5.6*  ALBUMIN 2.8*  --   --  2.6*  AST 19  --   --  18  ALT 21  --   --  16  ALKPHOS 36*  --   --  36*  BILITOT 0.9  --   --  0.7  TRIG  --   --   --  270*   Estimated Creatinine Clearance: 100.8 mL/min (by C-G formula based on Cr of 1.08).    Recent Labs  10/30/14 1816 10/31/14 0019 10/31/14 0517  GLUCAP 91 139* 139*    Medical History: Past Medical History  Diagnosis Date  . Renal disorder     kidney stones  . Hypertension   . Thyroid disease     Insulin Requirements:  4 units of moderate SSI since 18:00 1/13  Current Nutrition: NPO  IVF: KVO  Central access: PICC 1/13 (plans to remove femoral CVC) TPN start date:  1/13  ASSESSMENT                                                                                                          HPI: 21 y/oM with HTN and nephrolithiasis who presented to [facility] on 1/10 with abdominal pain of 1 day duration secondary to a perforated sigmoid d/t diverticulitis. He was taken emergently to OR on 1/10 and is now s/p partial colectomy. Orders to start TPN for anticipated prolonged ileus 1/13.  Significant events:  1/13: VDRF post-op remains on propofol (high needs) and pressor support with norepinephrine 1/14: Requiring  max doses of propofol (4750mcg/kg/min = 38.111ml/hr)  Today:   Glucose -  At goal 150mg /dl  Electrolytes - K = 3.5 (low end normal), phos = 3.4 (improved following 30mmol KPhos 1/13). Ca = 7.5 (corrects to 8.9)  Renal - SCr WNL  I/O = -863ml, NGT output = 350ml (on lasix 40mg  IV q8h)  LFTs - WNL 1/12  TGs - 270 (1/14)  Prealbumin - pending (1/14)  NUTRITIONAL GOALS                                                                                             RD recs: 150-160gm protein, 1800-2000kcal Clinimix 5/15 at a goal rate of 12720ml/hr + 20% fat emulsion at 510ml/hr on MWF to provide: 144 g/day protein, avg of 2276Kcal/day.  PLAN                                                                                                                         At 1800 today:  Increase Clinimix E5/15 to 2780ml/hr (~66% of goal rate)  No lipids as currently on high rate of propofol, at current rate propofol providing ~1005kcals.  Clinimix at new rate 5780ml/hr + propofol to provide ~ 2363 Kcals   Trig = 270 on propofol  Once appropriate to start lipid emulsion will do max MWF d/t national backorder  Plan to advance as tolerated to the goal rate but propofol needs may hinder ability to advance TPN further without overfeeding.   TPN to contain standard multivitamins and trace elements.  Continue moderate SSI/CBGs .   TPN lab panels on Mondays &  Thursdays.  F/u daily.  KCl IV runs x 4  Labs in am  Juliette Alcideustin Zeigler, PharmD, BCPS.   Pager: [phone]  10/31/2014,8:55 AM

## 2014-10-31 NOTE — Progress Notes (Signed)
[facility]  [address] [place], [place] [address] Phone: [phone] FAX: [phone]    Shahram Alexopoulos [id number] 10/03/1951  CARE TEAM:  PCP: No primary care provider on file.  Outpatient Care Team: No care team member to display  Inpatient Treatment Team: Treatment Team: Attending Provider: Md Edison Pace, MD; Technician: Kathlen Brunswick, [place]; Attending Physician: Md Edison Pace, MD; Consulting Physician: Md Pccm, MD; Registered Nurse: Carl Best, RN   Subjective:  Weaning pressors off Vent more support  Objective:  Vital signs:  Filed Vitals:   10/31/14 0300 10/31/14 0400 10/31/14 0500 10/31/14 0600  BP: 105/54 120/66 103/61   Pulse: 87 88 86 91  Temp:  99.6 F (37.6 C)    TempSrc:  Oral    Resp: 9 22 30 9   Height:      Weight:      SpO2: 93% 99% 93% 91%    Last BM Date: 10/28/14  Intake/Output   Yesterday:  01/13 0701 - 01/14 0700 In: 2705.8 [I.V.:1267.8; IV Piggyback:750; TPN:688] Out: 1448 [Urine:3305; Emesis/NG output:350] This shift:     Bowel function:  Flatus: n  BM: n  Drain: NGT thinner bilious  Physical Exam:  General: Pt intubated/sedated.  Does wake up & occ follow commands Eyes: PERRL, normal EOM.  Sclera clear.  No icterus Neuro: CN II-XII intact w/o focal sensory/motor deficits. Lymph: No head/neck/groin lymphadenopathy Psych:  No delerium/psychosis/paranoia HENT: Normocephalic, Mucus membranes moist.  No thrush.  OGT & ETT in place Neck: Supple, No tracheal deviation Chest:  Course BS Bilaterally. No chest wall pain w good excursion CV:  Pulses intact.  Regular rhythm MS: Normal AROM mjr joints.  No obvious deformity Abdomen: Mod distended.  Obese.  Wound vac in place.  Colostomy less purple/dusky & edematous but stable.  No incarcerated hernias. Ext:  SCDs BLE.  2+ edema.  No cyanosis Skin: No petechiae / purpura   Problem List:   Principal Problem:   Diverticulitis of colon with  perforation s/p colectomy/colostomy 10/28/2014 Active Problems:   Shock circulatory   Acute respiratory failure with hypoxia   Acute post-operative pain   Solitary pulmonary nodule on lung CT   Sleep apnea   Assessment  William [name]  64 y.o. male  3 Days Post-Op  Procedure(s): PARTIAL COLECTOMY Sigmoid for perforated diverticulitis COLOSTOMY EXPLORATORY LAPAROTOMY  Guarded but improved  Plan:  -vent support - wean better but challenge with ARDS & morbid obesity/distention -try trophic tuber feeds since off pressors -Volume as needed - already +5.6L - see if can diurese when off pressors > 24hrs -IV ABx - Zosyn at least.  Vanco per ICU -pathology benign -Ostomy care -Wound care -cont TNA for prolonged ileus -VTE prophylaxis- SCDs, etc -mobilize as tolerated to help recovery  .   Adin Hector, M.D., F.A.C.S. [facility] [address], [place], [place] [address] [phone] Main / Paging   10/31/2014   Results:   Labs: Results for orders placed or performed during the hospital encounter of 10/27/14 (from the past 48 hour(s))  Potassium     Status: None   Collection Time: 10/29/14  1:50 PM  Result Value Ref Range   Potassium 3.9 3.5 - 5.1 mmol/L    Comment: Please note change in reference range. DELTA CHECK NOTED   CBC with Differential     Status: Abnormal   Collection Time: 10/30/14  3:45 AM  Result Value  Ref Range   WBC 9.1 4.0 - 10.5 K/uL   RBC 3.56 (L) 4.22 - 5.81 MIL/uL   Hemoglobin 11.5 (L) 13.0 - 17.0 g/dL   HCT 35.5 (L) 39.0 - 52.0 %   MCV 99.7 78.0 - 100.0 fL   MCH 32.3 26.0 - 34.0 pg   MCHC 32.4 30.0 - 36.0 g/dL   RDW 14.6 11.5 - 15.5 %   Platelets 159 150 - 400 K/uL   Neutrophils Relative % 85 (H) 43 - 77 %   Neutro Abs 7.7 1.7 - 7.7 K/uL   Lymphocytes Relative 11 (L) 12 - 46 %   Lymphs Abs 1.0 0.7 - 4.0 K/uL   Monocytes Relative 4 3 - 12 %    Monocytes Absolute 0.3 0.1 - 1.0 K/uL   Eosinophils Relative 0 0 - 5 %   Eosinophils Absolute 0.0 0.0 - 0.7 K/uL   Basophils Relative 0 0 - 1 %   Basophils Absolute 0.0 0.0 - 0.1 K/uL  Basic metabolic panel     Status: Abnormal   Collection Time: 10/30/14  3:45 AM  Result Value Ref Range   Sodium 133 (L) 135 - 145 mmol/L    Comment: Please note change in reference range. RESULT REPEATED AND VERIFIED DELTA CHECK NOTED    Potassium 3.5 3.5 - 5.1 mmol/L    Comment: Please note change in reference range.   Chloride 103 96 - 112 mEq/L   CO2 27 19 - 32 mmol/L   Glucose, Bld 217 (H) 70 - 99 mg/dL   BUN 14 6 - 23 mg/dL   Creatinine, Ser 1.02 0.50 - 1.35 mg/dL   Calcium 7.3 (L) 8.4 - 10.5 mg/dL   GFR calc non Af Amer 77 (L) >90 mL/min   GFR calc Af Amer 90 (L) >90 mL/min    Comment: (NOTE) The eGFR has been calculated using the CKD EPI equation. This calculation has not been validated in all clinical situations. eGFR's persistently <90 mL/min signify possible Chronic Kidney Disease.    Anion gap 3 (L) 5 - 15  Phosphorus     Status: Abnormal   Collection Time: 10/30/14  3:45 AM  Result Value Ref Range   Phosphorus 1.7 (L) 2.3 - 4.6 mg/dL  Magnesium     Status: None   Collection Time: 10/30/14  3:45 AM  Result Value Ref Range   Magnesium 1.9 1.5 - 2.5 mg/dL  Vancomycin, trough     Status: Abnormal   Collection Time: 10/30/14  3:20 PM  Result Value Ref Range   Vancomycin Tr 8.5 (L) 10.0 - 20.0 ug/mL  Glucose, capillary     Status: None   Collection Time: 10/30/14  6:16 PM  Result Value Ref Range   Glucose-Capillary 91 70 - 99 mg/dL  Phosphorus     Status: None   Collection Time: 10/31/14  3:00 AM  Result Value Ref Range   Phosphorus 3.4 2.3 - 4.6 mg/dL  Comprehensive metabolic panel     Status: Abnormal   Collection Time: 10/31/14  3:00 AM  Result Value Ref Range   Sodium 136 135 - 145 mmol/L    Comment: Please note change in reference range.   Potassium 3.5 3.5 - 5.1  mmol/L    Comment: Please note change in reference range.   Chloride 98 96 - 112 mEq/L   CO2 32 19 - 32 mmol/L   Glucose, Bld 146 (H) 70 - 99 mg/dL   BUN 13 6 - 23 mg/dL  Creatinine, Ser 1.08 0.50 - 1.35 mg/dL   Calcium 7.5 (L) 8.4 - 10.5 mg/dL   Total Protein 5.6 (L) 6.0 - 8.3 g/dL   Albumin 2.6 (L) 3.5 - 5.2 g/dL   AST 18 0 - 37 U/L   ALT 16 0 - 53 U/L   Alkaline Phosphatase 36 (L) 39 - 117 U/L   Total Bilirubin 0.7 0.3 - 1.2 mg/dL   GFR calc non Af Amer 72 (L) >90 mL/min   GFR calc Af Amer 84 (L) >90 mL/min    Comment: (NOTE) The eGFR has been calculated using the CKD EPI equation. This calculation has not been validated in all clinical situations. eGFR's persistently <90 mL/min signify possible Chronic Kidney Disease.    Anion gap 6 5 - 15  CBC     Status: Abnormal   Collection Time: 10/31/14  3:00 AM  Result Value Ref Range   WBC 8.7 4.0 - 10.5 K/uL   RBC 3.55 (L) 4.22 - 5.81 MIL/uL   Hemoglobin 11.2 (L) 13.0 - 17.0 g/dL   HCT 35.0 (L) 39.0 - 52.0 %   MCV 98.6 78.0 - 100.0 fL   MCH 31.5 26.0 - 34.0 pg   MCHC 32.0 30.0 - 36.0 g/dL   RDW 14.2 11.5 - 15.5 %   Platelets 179 150 - 400 K/uL    Imaging / Studies: Dg Chest Port 1 View  10/31/2014   CLINICAL DATA:  Respiratory failure.  EXAM: PORTABLE CHEST - 1 VIEW  COMPARISON:  10/30/2014.  FINDINGS: Interim placement right PICC line, its tip is at the cavoatrial junction. Endotracheal tube, NG tube in stable position. Stable cardiomegaly. Stable bibasilar pulmonary infiltrates and small bilateral pleural effusions. No pneumothorax.  IMPRESSION: 1. Interval placement of PICC line comes to the cavoatrial junction. Endotracheal tube and NG tube in stable position. 2. Bibasilar infiltrates and small pleural effusions again noted and unchanged. 3. Stable cardiomegaly.   Electronically Signed   By: Marcello Moores  Register   On: 10/31/2014 07:24   Dg Chest Port 1 View  10/30/2014   CLINICAL DATA:  Respiratory failure.  Intubated patient.   EXAM: PORTABLE CHEST - 1 VIEW  COMPARISON:  Single view of the chest 10/27/2014, 10/28/2014 and 10/29/2014.  FINDINGS: Endotracheal tube and NG tube remain in place in good position. Bilateral effusions and basilar airspace disease have worsened. There is marked cardiomegaly. No pneumothorax is identified.  IMPRESSION: Worsened bilateral effusions and basilar airspace disease.   Electronically Signed   By: Inge Rise M.D.   On: 10/30/2014 07:31    Medications / Allergies: per chart  Antibiotics: Anti-infectives    Start     Dose/Rate Route Frequency Ordered Stop   10/30/14 2200  vancomycin (VANCOCIN) IVPB 1000 mg/200 mL premix     1,000 mg200 mL/hr over 60 Minutes Intravenous Every 8 hours 10/30/14 1746     10/28/14 1800  vancomycin (VANCOCIN) 1,250 mg in sodium chloride 0.9 % 250 mL IVPB  Status:  Discontinued     1,250 mg166.7 mL/hr over 90 Minutes Intravenous Every 12 hours 10/28/14 0358 10/28/14 0824   10/28/14 1400  vancomycin (VANCOCIN) IVPB 1000 mg/200 mL premix  Status:  Discontinued     1,000 mg200 mL/hr over 60 Minutes Intravenous Every 12 hours 10/28/14 0824 10/30/14 1746   10/28/14 1000  micafungin (MYCAMINE) 100 mg in sodium chloride 0.9 % 100 mL IVPB  Status:  Discontinued     100 mg100 mL/hr over 1 Hours Intravenous Daily 10/28/14 0342  10/28/14 0344   10/28/14 0800  metroNIDAZOLE (FLAGYL) IVPB 500 mg  Status:  Discontinued     500 mg100 mL/hr over 60 Minutes Intravenous Every 8 hours 10/28/14 0335 10/28/14 0343   10/28/14 0600  piperacillin-tazobactam (ZOSYN) IVPB 3.375 g     3.375 g12.5 mL/hr over 240 Minutes Intravenous 3 times per day 10/28/14 0339     10/28/14 0400  micafungin (MYCAMINE) 100 mg in sodium chloride 0.9 % 100 mL IVPB     100 mg100 mL/hr over 1 Hours Intravenous Daily 10/28/14 0342     10/28/14 0400  vancomycin (VANCOCIN) 1,750 mg in sodium chloride 0.9 % 500 mL IVPB     1,750 mg250 mL/hr over 120 Minutes Intravenous  Once 10/28/14 0353 10/28/14 0749    10/28/14 0030  [MAR Hold]  metroNIDAZOLE (FLAGYL) IVPB 500 mg     (MAR Hold since 10/28/14 0043)   500 mg100 mL/hr over 60 Minutes Intravenous  Once 10/28/14 0026 10/28/14 0200   10/27/14 2030  piperacillin-tazobactam (ZOSYN) IVPB 3.375 g     3.375 g100 mL/hr over 30 Minutes Intravenous  Once 10/27/14 2026 10/27/14 2110       Note: Portions of this report may have been transcribed using voice recognition software. Every effort was made to ensure accuracy; however, inadvertent computerized transcription errors may be present.   Any transcriptional errors that result from this process are unintentional.

## 2014-10-31 NOTE — Consult Note (Signed)
WOC ostomy follow up Stoma type/location: LLQ Colostomy Stomal assessment/size: 1 and 1/8 inches x 2 inches Peristomal assessment: intact, clear with depression in the peristomal area Treatment options for stomal/peristomal skin: skin barrier ring Output serosanguinous Ostomy pouching: 2pc., 2 and 3/4 inch with skin barrier ring Education provided: Patient remains intubated and sedated. Wife taught that stoma was edematous and that size would change gradually, necessitating frequent measuring and resizing in the post operative phase. Enrolled patient in [employer] program: No WOC nursing team will follow, and will remain available to this patient, the nursing, srugical and medical teams.   Thanks, Ladona MowLaurie Netra Postlethwait, MSN, RN, GNP, KingsWOCN, CWON-AP [phone])

## 2014-10-31 NOTE — Progress Notes (Signed)
Pt has functional double lumen PICC.  Instructed by day nurse to inquire about obtaining an order to remove femoral central line.  E-link notified of situation and advised that we leave the line in place until morning rounds based on the varying amounts of antibiotics and other medications currently on schedule.

## 2014-11-01 ENCOUNTER — Inpatient Hospital Stay (HOSPITAL_COMMUNITY): Payer: 59

## 2014-11-01 DIAGNOSIS — G8918 Other acute postprocedural pain: Secondary | ICD-10-CM

## 2014-11-01 DIAGNOSIS — R911 Solitary pulmonary nodule: Secondary | ICD-10-CM

## 2014-11-01 LAB — BASIC METABOLIC PANEL
ANION GAP: 8 (ref 5–15)
BUN: 16 mg/dL (ref 6–23)
CHLORIDE: 97 meq/L (ref 96–112)
CO2: 30 mmol/L (ref 19–32)
Calcium: 7.9 mg/dL — ABNORMAL LOW (ref 8.4–10.5)
Creatinine, Ser: 0.98 mg/dL (ref 0.50–1.35)
GFR calc Af Amer: >90 mL/min (ref >90)
GFR, EST NON AFRICAN AMERICAN: 87 mL/min — AB (ref >90)
Glucose, Bld: 148 mg/dL — ABNORMAL HIGH (ref 70–99)
POTASSIUM: 3.6 mmol/L (ref 3.5–5.1)
Sodium: 135 mmol/L (ref 135–145)

## 2014-11-01 LAB — GLUCOSE, CAPILLARY
GLUCOSE-CAPILLARY: 150 mg/dL — AB (ref 70–99)
Glucose-Capillary: 145 mg/dL — ABNORMAL HIGH (ref 70–99)
Glucose-Capillary: 161 mg/dL — ABNORMAL HIGH (ref 70–99)

## 2014-11-01 LAB — CBC
HEMATOCRIT: 36.8 % — AB (ref 39.0–52.0)
HEMOGLOBIN: 11.9 g/dL — AB (ref 13.0–17.0)
MCH: 31.6 pg (ref 26.0–34.0)
MCHC: 32.3 g/dL (ref 30.0–36.0)
MCV: 97.6 fL (ref 78.0–100.0)
Platelets: 191 10*3/uL (ref 150–400)
RBC: 3.77 MIL/uL — ABNORMAL LOW (ref 4.22–5.81)
RDW: 14.1 % (ref 11.5–15.5)
WBC: 8 10*3/uL (ref 4.0–10.5)

## 2014-11-01 LAB — VANCOMYCIN, TROUGH: Vancomycin Tr: 17.6 ug/mL (ref 10.0–20.0)

## 2014-11-01 LAB — MAGNESIUM: Magnesium: 2.1 mg/dL (ref 1.5–2.5)

## 2014-11-01 LAB — PHOSPHORUS: PHOSPHORUS: 2.6 mg/dL (ref 2.3–4.6)

## 2014-11-01 MED ORDER — POTASSIUM CHLORIDE 10 MEQ/100ML IV SOLN: 10.0000 meq | INTRAVENOUS | Status: DC

## 2014-11-01 MED ORDER — FUROSEMIDE 10 MG/ML IJ SOLN
80.0000 mg | Freq: Three times a day (TID) | INTRAMUSCULAR | Status: DC
  Administered 2014-11-01 – 2014-11-02 (×3): 80 mg via INTRAVENOUS
  Filled 2014-11-01 (×3): qty 8

## 2014-11-01 MED ORDER — SODIUM CHLORIDE 0.9 % IV SOLN: 80.0000 mg | Freq: Every day | INTRAVENOUS | Status: DC

## 2014-11-01 MED ORDER — POTASSIUM CHLORIDE 10 MEQ/50ML IV SOLN
10.0000 meq | INTRAVENOUS | Status: AC
  Administered 2014-11-01 (×2): 10 meq via INTRAVENOUS
  Filled 2014-11-01 (×3): qty 50

## 2014-11-01 MED ORDER — VITAL HIGH PROTEIN PO LIQD
1000.0000 mL | ORAL | Status: DC
  Administered 2014-11-01: 23:00:00
  Administered 2014-11-01: 1000 mL
  Administered 2014-11-01 – 2014-11-02 (×5)
  Filled 2014-11-01 (×2): qty 1000

## 2014-11-01 MED ORDER — TRACE MINERALS CR-CU-F-FE-I-MN-MO-SE-ZN IV SOLN
INTRAVENOUS | Status: AC
  Administered 2014-11-01: 18:00:00 via INTRAVENOUS
  Filled 2014-11-01: qty 1920

## 2014-11-01 MED ORDER — SODIUM CHLORIDE 0.9 % IV SOLN
1250.0000 mg | Freq: Two times a day (BID) | INTRAVENOUS | Status: DC
  Administered 2014-11-01: 1250 mg via INTRAVENOUS
  Filled 2014-11-01 (×2): qty 1250

## 2014-11-01 MED ORDER — PANTOPRAZOLE SODIUM 40 MG IV SOLR
40.0000 mg | INTRAVENOUS | Status: DC
  Administered 2014-11-01 – 2014-11-10 (×10): 40 mg via INTRAVENOUS
  Filled 2014-11-01 (×9): qty 40

## 2014-11-01 NOTE — Progress Notes (Signed)
95cc of 8910mcg/ml Fentanyl wasted in sink with Lezlie LyeLisa Frei, RN.   Sheryle HailGrigg,Tramane Gorum J, RN

## 2014-11-01 NOTE — Progress Notes (Signed)
PULMONARY / CRITICAL CARE MEDICINE HISTORY AND PHYSICAL EXAMINATION   Name: William [name] MRN: [id number] DOB: [birth year]    ADMISSION DATE:  10/27/2014  PRIMARY SERVICE: PCCM  CHIEF COMPLAINT:  Perforated sigmoid colon  BRIEF PATIENT DESCRIPTION:  64 y/o M with HTN and nephrolithiasis who presented to [facility] on 1/10 with abdominal pain of 1 day duration. He was found to have a perforated sigmoid colon and was planned to be managed conservatively but became hypotensive and required urgent intervention 1/10 with a partial colectomy.   SIGNIFICANT EVENTS / STUDIES:  1/10  Admit, Partial colectomy  1/11  70% FiO2 / PEEP 12, mild bleeding from surgical site 1/11  ECHO >> EF 55-60%, nml systolic fxn, PA peak 32 1/12  Improved O2 needs, vasopressor need reduced.   10/30/14: Start lasix for fio2 70%. Came off pressors 1/14  On TNA, failed WUA with agitation . Hypoxemia better. Down from 70% to 40% fio2   SUBJECTIVE/OVERNIGHT/INTERVAL HX 11/01/14:   Very agitated on WUA when diprivan/fent gtt cut in half. Unable to wean peep below 8; desaturates  VITAL SIGNS: Temp:  [99 F (37.2 C)-100.4 F (38 C)] 100.4 F (38 C) (01/15 0856) Pulse Rate:  [83-103] 92 (01/15 0600) Resp:  [11-30] 15 (01/15 0600) BP: (101-124)/(39-69) 108/57 mmHg (01/15 0600) SpO2:  [87 %-97 %] 90 % (01/15 0600) FiO2 (%):  [40 %] 40 % (01/15 0906)   HEMODYNAMICS:     VENTILATOR SETTINGS: Vent Mode:  [-] PRVC FiO2 (%):  [40 %] 40 % Set Rate:  [14 bmp] 14 bmp Vt Set:  [610 mL] 610 mL PEEP:  [5 cmH20-8 cmH20] 8 cmH20 Pressure Support:  [5 cmH20] 5 cmH20 Plateau Pressure:  [13 cmH20-17 cmH20] 14 cmH20   INTAKE / OUTPUT: Intake/Output      01/14 0701 - 01/15 0700 01/15 0701 - 01/16 0700   I.V. (mL/kg) 1747.7 (13.1)    IV Piggyback 1250    TPN 760    Total Intake(mL/kg) 3757.7 (28.2)    Urine (mL/kg/hr) 5015 (1.6)    Emesis/NG output 350 (0.1)    Drains 0 (0)    Stool 0 (0)    Total Output 5365     Net -1607.3             PHYSICAL EXAMINATION: General:  Obese male in NAD on vent  Neuro:  Sedated, periods of intermittent agitation HEENT:  Sclera anicteric, conjunctiva pink, ETT present Neck: Obese, no obvious JVD or LAN Cardiovascular:  s1s2 RRR Lungs:  Even/non-labored, lungs bilaterally clear Abdomen:  Obese, lower abd VAC, LLQ colostomy, mild blood tinged secretions in bag Musculoskeletal:  No acute deformities, moving all extremities spontaneously  Skin:  Intact  LABS:  CBC  Recent Labs Lab 10/30/14 0345 10/31/14 0300 11/01/14 0430  WBC 9.1 8.7 8.0  HGB 11.5* 11.2* 11.9*  HCT 35.5* 35.0* 36.8*  PLT 159 179 191   Coag's  Recent Labs Lab 10/29/14 0550  INR 1.23   BMET  Recent Labs Lab 10/30/14 0345 10/31/14 0300 11/01/14 0430  NA 133* 136 135  K 3.5 3.5 3.6  CL 103 98 97  CO2 27 32 30  BUN CREATININE 1.02 1.08 0.98  GLUCOSE 217* 146* 148*   Electrolytes  Recent Labs Lab 10/30/14 0345 10/31/14 0300 11/01/14 0430  CALCIUM 7.3* 7.5* 7.9*  MG 1.9 2.2 2.1  PHOS 1.7* 3.4 2.6   Sepsis Markers  Recent Labs Lab 10/28/14 1230 10/28/14 1800 10/29/14 0550  LATICACIDVEN 2.4* 1.9 1.5   ABG  Recent Labs Lab 10/28/14 0704  PHART 7.317*  PCO2ART 42.4  PO2ART 99.1   Liver Enzymes  Recent Labs Lab 10/28/14 0555 10/29/14 0550 10/31/14 0300  AST 26 19 18   ALT 23 21 16   ALKPHOS 33* 36* 36*  BILITOT 0.8 0.9 0.7  ALBUMIN 2.9* 2.8* 2.6*   Cardiac Enzymes  Recent Labs Lab 10/28/14 1230 10/28/14 1800 10/29/14 0550  TROPONINI <0.03 <0.03 <0.03   Glucose  Recent Labs Lab 10/31/14 0019 10/31/14 0517 10/31/14 1327 10/31/14 1741 10/31/14 2316 11/01/14 0543  GLUCAP 139* 139* 137* 133* 140* 145*    Imaging Dg Chest Port 1 View  11/01/2014   CLINICAL DATA:  Subsequent encounter for respiratory failure. Endotracheal tube placement.  EXAM: PORTABLE CHEST - 1 VIEW  COMPARISON:  10/31/2014  FINDINGS: 0450 hrs. Endotracheal tube tip is  6.4 cm above the base of the carina. The NG tube passes into the stomach although the distal tip position is not included on the film. Right PICC line tip overlies the distal SVC, near the SVC/RA junction. Lung volumes are low with bibasilar collapse/ consolidation and small bilateral pleural effusions. Pulmonary vascular congestion again noted without overt airspace pulmonary edema. The cardio pericardial silhouette is enlarged.  IMPRESSION: Cardiomegaly with bibasilar collapse/ consolidation and small effusions.  Stable position of support apparatus.   Electronically Signed   By: Kennith CenterEric  Mansell M.D.   On: 11/01/2014 07:11   Dg Chest Port 1 View  10/31/2014   CLINICAL DATA:  Respiratory failure.  EXAM: PORTABLE CHEST - 1 VIEW  COMPARISON:  10/30/2014.  FINDINGS: Interim placement right PICC line, its tip is at the cavoatrial junction. Endotracheal tube, NG tube in stable position. Stable cardiomegaly. Stable bibasilar pulmonary infiltrates and small bilateral pleural effusions. No pneumothorax.  IMPRESSION: 1. Interval placement of PICC line comes to the cavoatrial junction. Endotracheal tube and NG tube in stable position. 2. Bibasilar infiltrates and small pleural effusions again noted and unchanged. 3. Stable cardiomegaly.   Electronically Signed   By: Maisie Fushomas  Register   On: 10/31/2014 07:24    ASSESSMENT / PLAN:  Principal Problem:   Diverticulitis of colon with perforation s/p colectomy/colostomy 10/28/2014 Active Problems:   Shock circulatory   Acute respiratory failure with hypoxia   Acute post-operative pain   Solitary pulmonary nodule on lung CT   Sleep apnea   PULMONARY A: Acute Hypoxic Resp Failure - improving O2 needs, down to 40% 1/12 Small Bilateral Pleural Effusions OSA on CPAP 8 mm RLL Nodule   11/01/2014: does not meet sbt criteria due to acute enceph/agitation and peep needs of 8   P:   Lung protective ventilation SBT/WUA daily  Trend CXR  Wean PEEP/FiO2 for sats  90-95% Will need follow-up CTs as OP for nodule CPAP post extubation in setting of OSA   CARDIOVASCULAR ETT 1/10 >> L femoral CVC 1/11 >> Art Line to be placed 1/11 >> RUE PICC 1/13 >>  A: Circulatory Shock - secondary to perforation. S/p ~ 5.7 L fluid resuscitation. Resolved, off pressors 1/13. NSTEMI - likely demand ischemia HTN   - off perssors and on lasix since 10/30/14. Needs more diuresis  P:   Follow hemodynamics  Lasix increase to 80mg  Q8h  RENAL A: Lactic Acidosis  - resolved  P:   Trend BMP Replace electrolytes as indicated Lasix as above   GASTROINTESTINAL A: Perforated Sigmoid s/p Partial Colectomy (1/10) Nutrition    - On TPN  P:  Management per surgery Defer feeding to CCS NPO PPI  HEMATOLOGIC A: Bleeding - mild ooze from surgical sites, slowing.  No change in H/H.  - immediate post op   - no active bleed    P: CBC daily  SCD's   INFECTIOUS Abscess culture 1/11 >> neg  Blood culture 1/11 >> Urine culture 1/11 >>neg A: Intraabdominal abscess (Sigmoid Diverticulitis) s/p Perforation    - low grade fever + 11/01/2014   P:   Follow-up cultures above Vanc 1/10, D5/x Zosyn 1/10, D5/x Micafungin 1/10, D5/x   ENDOCRINE A: Hyperglycemia Hypothyroidism  P:   SSI  Continue synthroid IV  NEUROLOGIC A: Post Operative Pain - s/p partial colectomy   - agitatio and confusion on WUA 11/01/2014   P: Diprivan gtt (chceck CK, lactate 11/02/14) Fent gtt RASS Goal:  -2       The patient is critically ill with multiple organ systems failure and requires high complexity decision making for assessment and support, frequent evaluation and titration of therapies, application of advanced monitoring technologies and extensive interpretation of multiple databases.   Critical Care Time devoted to patient care services described in this note is  30  Minutes. This time reflects time of care of this signee Dr Kalman Shan. This critical  care time does not reflect procedure time, or teaching time or supervisory time of PA/NP/Med student/Med Resident etc but could involve care discussion time    Dr. Kalman Shan, M.D., [facility].C.P Pulmonary and Critical Care Medicine Staff Physician [facility] [place] Pulmonary and Critical Care Pager: [phone], If no answer or between  15:00h - 7:00h: call [phone]  11/01/2014 10:48 AM

## 2014-11-01 NOTE — Progress Notes (Signed)
PARENTERAL NUTRITION CONSULT NOTE   Pharmacy Consult for TPN Indication: Prolonged ileus  Allergies  Allergen Reactions  . Aloprim [Allopurinol] Other (See Comments)    Unknown reaction  . Simvastatin Other (See Comments)    Unknown reaction    Patient Measurements: Height: 5' 11.5" (181.6 cm) Weight: 293 lb 10.4 oz (133.2 kg) IBW/kg (Calculated) : 76.45 Adjusted Body Weight: 99kg Usual Weight: 114kg  Vital Signs: Temp: 100.4 F (38 C) (01/15 0856) Temp Source: Oral (01/15 0856) BP: 108/57 mmHg (01/15 0600) Pulse Rate: 92 (01/15 0600) Intake/Output from previous day: 01/14 0701 - 01/15 0700 In: 3757.7 [I.V.:1747.7; IV Piggyback:1250; TPN:760] Out: 5365 [Urine:5015; Emesis/NG output:350] Intake/Output from this shift:    Labs:  Recent Labs  10/30/14 0345 10/31/14 0300 11/01/14 0430  WBC 9.1 8.7 8.0  HGB 11.5* 11.2* 11.9*  HCT 35.5* 35.0* 36.8*  PLT 159 179 191     Recent Labs  10/30/14 0345 10/31/14 0300 11/01/14 0430  NA 133* 136 135  K 3.5 3.5 3.6  CL 103 98 97  CO2 27 32 30  GLUCOSE 217* 146* 148*  BUN CREATININE 1.02 1.08 0.98  CALCIUM 7.3* 7.5* 7.9*  MG 1.9 2.2 2.1  PHOS 1.7* 3.4 2.6  PROT  --  5.6*  --   ALBUMIN  --  2.6*  --   AST  --  18  --   ALT  --  16  --   ALKPHOS  --  36*  --   BILITOT  --  0.7  --   PREALBUMIN  --  9.7*  --   TRIG  --  270*  --   Corrected Ca = 9.0 Estimated Creatinine Clearance: 111.1 mL/min (by C-G formula based on Cr of 0.98).    Recent Labs  10/31/14 1741 10/31/14 2316 11/01/14 0543  GLUCAP 133* 140* 145*    Medical History: Past Medical History  Diagnosis Date  . Renal disorder     kidney stones  . Hypertension   . Thyroid disease     Insulin Requirements in past 24 hours:  8 units of moderate SSI Novolog  Current Nutrition: NPO  IVF: KVO  Central access: PICC 10/30/14 TPN start date: 1/13  ASSESSMENT                                                                                                           HPI: 88 y/oM with HTN and nephrolithiasis who presented to [facility] on 1/10 with abdominal pain of 1 day duration secondary to a perforated sigmoid d/t diverticulitis. He was taken emergently to OR on 1/10 and is now s/p partial colectomy. Orders to start TPN for anticipated prolonged ileus 1/13.  Significant events:  1/13: VDRF post-op remains on propofol (high needs) and pressor support with norepinephrine 1/14: Requiring max doses of propofol (66mcg/kg/min = 38.66ml/hr) 1/15: Propofol at 30 mcg/kg/min (22.9 mL/hr)  Today:   Glucose:  At goal 150mg /dl on Novolog SSI  Electrolytes: K near LLN after runs x4 yesterday, others WNL.  Renal:  SCr and BUN WNL  I/O: -1.6L (on Lasix 40mg  IV q8h), NGT output = 0.35L   LFTs: all below ULN on 1/14  TGs: 270 (1/14)  Prealbumin: 9.7 (1/14)  NUTRITIONAL GOALS                                                                                             RD recs: 150-160gm protein, 1800-2000kcal Clinimix-E 5/15 at a goal rate of 12020ml/hr + 20% fat emulsion at 5910ml/hr on MWF to provide: 144 g/day protein, avg of 2250 Kcal/day.  PLAN                                                                                                                         1. KCl 10mEq IV q1h x 2 2. At 1800 today:  Continue Clinimix-E 5/15 at 280ml/hr (~66% of goal rate)  No lipids as currently on high rate of propofol.  Current propofol rate  provides ~550 KCal/day.    Current combination of Clinimix-E 5/15 plus lipid vehicle of propofol  provides 96 grams protein/day,  1913 KCal/day  Plan to advance as tolerated to goal rate but propofol needs may  hinder ability to advance TPN further without overfeeding.   TPN to contain standard multivitamins and trace elements. 3. Continue moderate SSI Novolog.  4. BMet, Mg, Phos tomorrow. 5. Await further word on possibility of initiating trophic enteral feeds. 6. Follow clinical course  daily.  William [name], PharmD, BCPS Pager: [phone] 11/01/2014  10:07 AM

## 2014-11-01 NOTE — Progress Notes (Addendum)
ANTIBIOTIC CONSULT NOTE - FOLLOW UP  Pharmacy Consult for Vancomycin, Zosyn Indication: Sepsis secondary to intraabdominal abscess  Allergies  Allergen Reactions  . Aloprim [Allopurinol] Other (See Comments)    Unknown reaction  . Simvastatin Other (See Comments)    Unknown reaction    Patient Measurements: Height: 5' 11.5" (181.6 cm) Weight: 293 lb 10.4 oz (133.2 kg) IBW/kg (Calculated) : 76.45  Vital Signs: Temp: 99.9 F (37.7 C) (01/15 0400) Temp Source: Oral (01/15 0400) BP: 108/57 mmHg (01/15 0600) Pulse Rate: 92 (01/15 0600) Intake/Output from previous day: 01/14 0701 - 01/15 0700 In: 3757.7 [I.V.:1747.7; IV Piggyback:1250; TPN:760] Out: 5365 [Urine:5015; Emesis/NG output:350] Intake/Output from this shift: Total I/O In: 1648.5 [I.V.:898.5; IV Piggyback:750] Out: 2805 [Urine:2625; Emesis/NG output:180]  Labs:  Recent Labs  10/30/14 0345 10/31/14 0300 11/01/14 0430  WBC 9.1 8.7 8.0  HGB 11.5* 11.2* 11.9*  PLT 159 179 191  CREATININE 1.02 1.08 0.98   Estimated Creatinine Clearance: 111.1 mL/min (by C-G formula based on Cr of 0.98).  Recent Labs  10/30/14 1520 11/01/14 0430  VANCOTROUGH 8.5* 17.6     Microbiology: 1/11 blood x 2: NGTD 1/11 urine: NGF 1/11 intra-abd abscess: NGF 1/11 MRSA PCR: negative  Anti-infectives: 1/10 >> Zosyn >> 1/11 >> Vancomycin >> 1/11 >> Micafungin >>   Dose changes/drug level info:  1/11: Vancomycin changed to to 1g IV q12h per obesity nomogram/Normalized CrCl 1/13  Vancomycin trough 8.5 on 1 g IV q12h - increased to 1g IV q8h.   Assessment: [age] y/oM with HTN and nephrolithiasis who [facility] with abdominal pain of 1 day duration secondary to a perforated sigmoid. He was taken emergently to OR on 1/10 and is now s/p partial colectomy. Pharmacy consulted to assist with dosing of Vancomycin and Zosyn for sepsis secondary to intraabdominal abscess. Patient is also on Micafungin per MD for  same. .  Goal of Therapy:  Vancomycin trough level 15-20 mcg/ml  Appropriate antibiotic dosing for renal function and indication Eradication of infection  Today, 11/01/2014 D#5 Zosyn 3.375 grams IV q8h (extended-infusion) D#5 vancomycin, now 1 gram IV q8h D#5 micafungin 100 mg IV q24h Low-grade temp this AM Leukocytosis resolved Off pressors SCr stable Cultures unrevealing Vancomycin trough therapeutic, but likely to accumulate current on q8h regimen  Plan: 1. Reduce vancomycin to 1250 mg IV q12h, next dose 8pm 2. Continue Zosyn 3.375 grams IV q8h (extended-infusion), micafungin 100 mg IV q24h. 3. Follow serum creatinine, culture results, clinical course. 4. Possibly consider de-escalating regimen by discontinuing vancomycin?  Await MD review.  Elie Goodyandy Hyrum Shaneyfelt, PharmD, BCPS Pager: [phone] 11/01/2014  6:59 AM

## 2014-11-01 NOTE — Progress Notes (Signed)
4 Days Post-Op  Subjective: He failed vent weaning, and is sedated on the Vent.  Off pressors.    Objective: Vital signs in last 24 hours: Temp:  [99 F (37.2 C)-100.4 F (38 C)] 99.9 F (37.7 C) (01/15 0400) Pulse Rate:  [83-103] 92 (01/15 0600) Resp:  [11-30] 15 (01/15 0600) BP: (91-124)/(39-71) 108/57 mmHg (01/15 0600) SpO2:  [87 %-97 %] 90 % (01/15 0600) FiO2 (%):  [40 %] 40 % (01/15 0600) Last BM Date: 10/28/14 350 from the NG NPO No stool,  TPN for  Labs OK Still on Ventilator Intake/Output from previous day: 01/14 0701 - 01/15 0700 In: 3757.7 [I.V.:1747.7; IV Piggyback:1250; TPN:760] Out: 5365 [Urine:5015; Emesis/NG output:350] Intake/Output this shift:    General appearance: sedated on the Vent. GI: No bowel sound, not much coming from the NG.  Wound vac in place.  Lab Results:   Recent Labs  10/31/14 0300 11/01/14 0430  WBC 8.7 8.0  HGB 11.2* 11.9*  HCT 35.0* 36.8*  PLT 179 191    BMET  Recent Labs  10/31/14 0300 11/01/14 0430  NA 136 135  K 3.5 3.6  CL 98 97  CO2 32 30  GLUCOSE 146* 148*  BUN 13 16  CREATININE 1.08 0.98  CALCIUM 7.5* 7.9*   PT/INR No results for input(s): LABPROT, INR in the last 72 hours.   Recent Labs Lab 10/27/14 1810 10/28/14 0555 10/29/14 0550 10/31/14 0300  AST 29 26 19 18   ALT 24 23 21 16   ALKPHOS 53 33* 36* 36*  BILITOT 0.7 0.8 0.9 0.7  PROT 6.8 4.8* 5.4* 5.6*  ALBUMIN 4.6 2.9* 2.8* 2.6*     Lipase     Component Value Date/Time   LIPASE 23 10/27/2014 1810     Studies/Results: Dg Chest Port 1 View  11/01/2014   CLINICAL DATA:  Subsequent encounter for respiratory failure. Endotracheal tube placement.  EXAM: PORTABLE CHEST - 1 VIEW  COMPARISON:  10/31/2014  FINDINGS: 0450 hrs. Endotracheal tube tip is 6.4 cm above the base of the carina. The NG tube passes into the stomach although the distal tip position is not included on the film. Right PICC line tip overlies the distal SVC, near the SVC/RA  junction. Lung volumes are low with bibasilar collapse/ consolidation and small bilateral pleural effusions. Pulmonary vascular congestion again noted without overt airspace pulmonary edema. The cardio pericardial silhouette is enlarged.  IMPRESSION: Cardiomegaly with bibasilar collapse/ consolidation and small effusions.  Stable position of support apparatus.   Electronically Signed   By: Kennith CenterEric  Mansell M.D.   On: 11/01/2014 07:11   Dg Chest Port 1 View  10/31/2014   CLINICAL DATA:  Respiratory failure.  EXAM: PORTABLE CHEST - 1 VIEW  COMPARISON:  10/30/2014.  FINDINGS: Interim placement right PICC line, its tip is at the cavoatrial junction. Endotracheal tube, NG tube in stable position. Stable cardiomegaly. Stable bibasilar pulmonary infiltrates and small bilateral pleural effusions. No pneumothorax.  IMPRESSION: 1. Interval placement of PICC line comes to the cavoatrial junction. Endotracheal tube and NG tube in stable position. 2. Bibasilar infiltrates and small pleural effusions again noted and unchanged. 3. Stable cardiomegaly.   Electronically Signed   By: Maisie Fushomas  Register   On: 10/31/2014 07:24    Medications: . antiseptic oral rinse  7 mL Mouth Rinse QID  . chlorhexidine  15 mL Mouth Rinse BID  . furosemide  40 mg Intravenous 3 times per day  . heparin  5,000 Units Subcutaneous 3 times per day  .  insulin aspart  0-15 Units Subcutaneous Q6H  . levothyroxine  75 mcg Intravenous Daily  . lip balm  1 application Topical BID  . micafungin [facility]) IV  100 mg Intravenous Q0600  . pantoprazole (PROTONIX) IV  40 mg Intravenous QHS  . piperacillin-tazobactam (ZOSYN)  IV  3.375 g Intravenous 3 times per day  . pneumococcal 23 valent vaccine  0.5 mL Intramuscular Tomorrow-1000  . sodium chloride  10-40 mL Intracatheter Q12H  . vancomycin  1,250 mg Intravenous Q12H    Assessment/Plan  1. Sigmoid diverticulitis, Perforated Bowel EXPLORATORY LAPAROTOMY, PARTIAL COLECTOMY Sigmoid, COLOSTOMY,   Repair umbilical hernia, 10/28/2014, [name], MD.  2. Sepsis with hypotension 3.Ventilator dependant respiratory failure 4. Hx of hypertension, now requiring pressors 5. Hypothyroid TSH 16 6. Hx of tobacco use 30 years (quit 9 years ago) 7. Body mass index is 38.51 kg  8. Hx of nephrolithiasis 9.  Heparin for DVT prohpylaxis  Plan:  From our standpoint  He is stable.  Dr. Michaell Cowing is considering some trophic feeds but will wait for CCM to see and discuss with them.  Randal,Renae Mottley 11/01/2014

## 2014-11-01 NOTE — Progress Notes (Signed)
PT ventilator wiped down at approximately 1510.

## 2014-11-02 ENCOUNTER — Inpatient Hospital Stay (HOSPITAL_COMMUNITY): Payer: 59

## 2014-11-02 DIAGNOSIS — J9601 Acute respiratory failure with hypoxia: Secondary | ICD-10-CM

## 2014-11-02 LAB — BASIC METABOLIC PANEL
Anion gap: 6 (ref 5–15)
BUN: 20 mg/dL (ref 6–23)
CO2: 31 mmol/L (ref 19–32)
Calcium: 7.8 mg/dL — ABNORMAL LOW (ref 8.4–10.5)
Chloride: 93 mEq/L — ABNORMAL LOW (ref 96–112)
Creatinine, Ser: 1.01 mg/dL (ref 0.50–1.35)
GFR calc Af Amer: >90 mL/min (ref >90)
GFR, EST NON AFRICAN AMERICAN: 78 mL/min — AB (ref >90)
Glucose, Bld: 191 mg/dL — ABNORMAL HIGH (ref 70–99)
Potassium: 3.5 mmol/L (ref 3.5–5.1)
Sodium: 130 mmol/L — ABNORMAL LOW (ref 135–145)

## 2014-11-02 LAB — GLUCOSE, CAPILLARY
GLUCOSE-CAPILLARY: 171 mg/dL — AB (ref 70–99)
Glucose-Capillary: 184 mg/dL — ABNORMAL HIGH (ref 70–99)
Glucose-Capillary: 206 mg/dL — ABNORMAL HIGH (ref 70–99)
Glucose-Capillary: 176 mg/dL — ABNORMAL HIGH (ref 70–99)

## 2014-11-02 LAB — MAGNESIUM: MAGNESIUM: 2 mg/dL (ref 1.5–2.5)

## 2014-11-02 LAB — CK TOTAL AND CKMB (NOT AT ARMC)
CK, MB: 2.1 ng/mL (ref 0.3–4.0)
Relative Index: 1.3 (ref 0.0–2.5)
Total CK: 158 U/L (ref 7–232)

## 2014-11-02 LAB — LACTIC ACID, PLASMA: LACTIC ACID, VENOUS: 1.1 mmol/L (ref 0.5–2.2)

## 2014-11-02 LAB — PHOSPHORUS: PHOSPHORUS: 4 mg/dL (ref 2.3–4.6)

## 2014-11-02 MED ORDER — INSULIN ASPART 100 UNIT/ML ~~LOC~~ SOLN
0.0000 [IU] | SUBCUTANEOUS | Status: DC
  Administered 2014-11-02: 3 [IU] via SUBCUTANEOUS
  Administered 2014-11-02: 5 [IU] via SUBCUTANEOUS
  Administered 2014-11-02 – 2014-11-04 (×13): 3 [IU] via SUBCUTANEOUS
  Administered 2014-11-05: 5 [IU] via SUBCUTANEOUS
  Administered 2014-11-05: 3 [IU] via SUBCUTANEOUS
  Administered 2014-11-05 (×2): 5 [IU] via SUBCUTANEOUS
  Administered 2014-11-05 (×2): 3 [IU] via SUBCUTANEOUS
  Administered 2014-11-06: 8 [IU] via SUBCUTANEOUS
  Administered 2014-11-06: 5 [IU] via SUBCUTANEOUS
  Administered 2014-11-06: 8 [IU] via SUBCUTANEOUS

## 2014-11-02 MED ORDER — FENTANYL CITRATE 0.05 MG/ML IJ SOLN
25.0000 ug | INTRAMUSCULAR | Status: DC | PRN
  Administered 2014-11-02 – 2014-11-04 (×15): 100 ug via INTRAVENOUS
  Filled 2014-11-02 (×15): qty 2

## 2014-11-02 MED ORDER — TRACE MINERALS CR-CU-F-FE-I-MN-MO-SE-ZN IV SOLN
INTRAVENOUS | Status: AC
  Administered 2014-11-02: 18:00:00 via INTRAVENOUS
  Filled 2014-11-02: qty 1920

## 2014-11-02 MED ORDER — DEXMEDETOMIDINE HCL IN NACL 400 MCG/100ML IV SOLN
0.4000 ug/kg/h | INTRAVENOUS | Status: DC
  Administered 2014-11-02 (×4): 1.2 ug/kg/h via INTRAVENOUS
  Administered 2014-11-02: 0.7 ug/kg/h via INTRAVENOUS
  Administered 2014-11-03 (×2): 1 ug/kg/h via INTRAVENOUS
  Filled 2014-11-02: qty 50
  Filled 2014-11-02 (×3): qty 100
  Filled 2014-11-02: qty 50
  Filled 2014-11-02 (×3): qty 100
  Filled 2014-11-02 (×2): qty 50

## 2014-11-02 MED ORDER — LIDOCAINE HCL (CARDIAC) 20 MG/ML IV SOLN
INTRAVENOUS | Status: AC
  Filled 2014-11-02: qty 5

## 2014-11-02 MED ORDER — ROCURONIUM BROMIDE 50 MG/5ML IV SOLN
INTRAVENOUS | Status: AC
  Filled 2014-11-02: qty 2

## 2014-11-02 MED ORDER — FUROSEMIDE 10 MG/ML IJ SOLN
80.0000 mg | Freq: Four times a day (QID) | INTRAMUSCULAR | Status: AC
Start: 2014-11-02 — End: 2014-11-02
  Administered 2014-11-02 (×2): 80 mg via INTRAVENOUS
  Filled 2014-11-02 (×2): qty 8

## 2014-11-02 MED ORDER — POTASSIUM CHLORIDE 10 MEQ/100ML IV SOLN
10.0000 meq | INTRAVENOUS | Status: AC
  Administered 2014-11-02 (×4): 10 meq via INTRAVENOUS
  Filled 2014-11-02 (×4): qty 100

## 2014-11-02 MED ORDER — SUCCINYLCHOLINE CHLORIDE 20 MG/ML IJ SOLN
INTRAMUSCULAR | Status: AC
  Filled 2014-11-02: qty 1

## 2014-11-02 MED ORDER — ETOMIDATE 2 MG/ML IV SOLN
INTRAVENOUS | Status: AC
  Administered 2014-11-02: 40 mg
  Filled 2014-11-02: qty 20

## 2014-11-02 MED ORDER — LORAZEPAM 2 MG/ML IJ SOLN
2.0000 mg | INTRAMUSCULAR | Status: DC | PRN
Start: 2014-11-02 — End: 2014-11-13
  Administered 2014-11-02 – 2014-11-13 (×33): 2 mg via INTRAVENOUS
  Filled 2014-11-02 (×33): qty 1

## 2014-11-02 NOTE — Progress Notes (Signed)
Patient ID: William [name], male   DOB: 09/15/1951, 63 y.o.   MRN: [id number] [facility] Progress Note:   5 Days Post-Op  Subjective: Mental status is obtunded;  Just extubated.   Objective: Vital signs in last 24 hours: Temp:  [98.5 F (36.9 C)-100.3 F (37.9 C)] 99.7 F (37.6 C) (01/16 0800) Pulse Rate:  [93-118] 114 (01/16 1100) Resp:  [0-28] 18 (01/16 1000) BP: (102-159)/(58-112) 107/62 mmHg (01/16 1100) SpO2:  [89 %-97 %] 91 % (01/16 1100) FiO2 (%):  [40 %-100 %] 55 % (01/16 0858) Weight:  [282 lb 6.6 oz (128.1 kg)] 282 lb 6.6 oz (128.1 kg) (01/16 0403)  Intake/Output from previous day: 01/15 0701 - 01/16 0700 In: 3932.2 [I.V.:1337.9; NG/GT:204.3; IV Piggyback:550; TPN:1840] Out: 6465 [Urine:6225; Emesis/NG output:240] Intake/Output this shift: Total I/O In: 958.7 [I.V.:178.7; IV Piggyback:300; TPN:480] Out: -   Physical Exam: Work of breathing is difficult to assess after extubation but does not appear labored.  Ostomy with minimal function.  Holding trickle feeds.    Lab Results:  Results for orders placed or performed during the hospital encounter of 10/27/14 (from the past 48 hour(s))  Glucose, capillary     Status: Abnormal   Collection Time: 10/31/14  1:27 PM  Result Value Ref Range   Glucose-Capillary 137 (H) 70 - 99 mg/dL   Comment 1 Documented in Chart    Comment 2 Notify RN   Glucose, capillary     Status: Abnormal   Collection Time: 10/31/14  5:41 PM  Result Value Ref Range   Glucose-Capillary 133 (H) 70 - 99 mg/dL  Glucose, capillary     Status: Abnormal   Collection Time: 10/31/14 11:16 PM  Result Value Ref Range   Glucose-Capillary 140 (H) 70 - 99 mg/dL  Phosphorus     Status: None   Collection Time: 11/01/14  4:30 AM  Result Value Ref Range   Phosphorus 2.6 2.3 - 4.6 mg/dL  Basic metabolic panel     Status: Abnormal   Collection Time: 11/01/14  4:30 AM  Result Value Ref Range   Sodium 135 135 - 145 mmol/L    Comment: Please note  change in reference range.   Potassium 3.6 3.5 - 5.1 mmol/L    Comment: Please note change in reference range.   Chloride 97 96 - 112 mEq/L   CO2 30 19 - 32 mmol/L   Glucose, Bld 148 (H) 70 - 99 mg/dL   BUN 16 6 - 23 mg/dL   Creatinine, Ser 0.98 0.50 - 1.35 mg/dL   Calcium 7.9 (L) 8.4 - 10.5 mg/dL   GFR calc non Af Amer 87 (L) >90 mL/min   GFR calc Af Amer >90 >90 mL/min    Comment: (NOTE) The eGFR has been calculated using the CKD EPI equation. This calculation has not been validated in all clinical situations. eGFR's persistently <90 mL/min signify possible Chronic Kidney Disease.    Anion gap 8 5 - 15  Magnesium     Status: None   Collection Time: 11/01/14  4:30 AM  Result Value Ref Range   Magnesium 2.1 1.5 - 2.5 mg/dL  Vancomycin, trough     Status: None   Collection Time: 11/01/14  4:30 AM  Result Value Ref Range   Vancomycin Tr 17.6 10.0 - 20.0 ug/mL  CBC     Status: Abnormal   Collection Time: 11/01/14  4:30 AM  Result Value Ref Range   WBC 8.0 4.0 - 10.5 K/uL   RBC   3.77 (L) 4.22 - 5.81 MIL/uL   Hemoglobin 11.9 (L) 13.0 - 17.0 g/dL   HCT 36.8 (L) 39.0 - 52.0 %   MCV 97.6 78.0 - 100.0 fL   MCH 31.6 26.0 - 34.0 pg   MCHC 32.3 30.0 - 36.0 g/dL   RDW 14.1 11.5 - 15.5 %   Platelets 191 150 - 400 K/uL  Glucose, capillary     Status: Abnormal   Collection Time: 11/01/14  5:43 AM  Result Value Ref Range   Glucose-Capillary 145 (H) 70 - 99 mg/dL  Glucose, capillary     Status: Abnormal   Collection Time: 11/01/14 12:00 PM  Result Value Ref Range   Glucose-Capillary 161 (H) 70 - 99 mg/dL   Comment 1 Documented in Chart    Comment 2 Notify RN   Glucose, capillary     Status: Abnormal   Collection Time: 11/01/14  5:46 PM  Result Value Ref Range   Glucose-Capillary 150 (H) 70 - 99 mg/dL  Glucose, capillary     Status: Abnormal   Collection Time: 11/01/14 11:15 PM  Result Value Ref Range   Glucose-Capillary 184 (H) 70 - 99 mg/dL  Basic metabolic panel     Status:  Abnormal   Collection Time: 11/02/14  4:39 AM  Result Value Ref Range   Sodium 130 (L) 135 - 145 mmol/L    Comment: Please note change in reference range.   Potassium 3.5 3.5 - 5.1 mmol/L    Comment: Please note change in reference range.   Chloride 93 (L) 96 - 112 mEq/L   CO2 31 19 - 32 mmol/L   Glucose, Bld 191 (H) 70 - 99 mg/dL   BUN 20 6 - 23 mg/dL   Creatinine, Ser 1.01 0.50 - 1.35 mg/dL   Calcium 7.8 (L) 8.4 - 10.5 mg/dL   GFR calc non Af Amer 78 (L) >90 mL/min   GFR calc Af Amer >90 >90 mL/min    Comment: (NOTE) The eGFR has been calculated using the CKD EPI equation. This calculation has not been validated in all clinical situations. eGFR's persistently <90 mL/min signify possible Chronic Kidney Disease.    Anion gap 6 5 - 15  Magnesium     Status: None   Collection Time: 11/02/14  4:39 AM  Result Value Ref Range   Magnesium 2.0 1.5 - 2.5 mg/dL  Phosphorus     Status: None   Collection Time: 11/02/14  4:39 AM  Result Value Ref Range   Phosphorus 4.0 2.3 - 4.6 mg/dL  Lactic acid, plasma     Status: None   Collection Time: 11/02/14  4:39 AM  Result Value Ref Range   Lactic Acid, Venous 1.1 0.5 - 2.2 mmol/L  CK total and CKMB (cardiac)     Status: None   Collection Time: 11/02/14  4:39 AM  Result Value Ref Range   Total CK 158 7 - 232 U/L   CK, MB 2.1 0.3 - 4.0 ng/mL   Relative Index 1.3 0.0 - 2.5    Comment: Performed at [facility]    Radiology/Results: Dg Chest Port 1 View  11/01/2014   CLINICAL DATA:  Subsequent encounter for respiratory failure. Endotracheal tube placement.  EXAM: PORTABLE CHEST - 1 VIEW  COMPARISON:  10/31/2014  FINDINGS: 0450 hrs. Endotracheal tube tip is 6.4 cm above the base of the carina. The NG tube passes into the stomach although the distal tip position is not included on the film. Right PICC   line tip overlies the distal SVC, near the SVC/RA junction. Lung volumes are low with bibasilar collapse/ consolidation and small bilateral  pleural effusions. Pulmonary vascular congestion again noted without overt airspace pulmonary edema. The cardio pericardial silhouette is enlarged.  IMPRESSION: Cardiomegaly with bibasilar collapse/ consolidation and small effusions.  Stable position of support apparatus.   Electronically Signed   By: Eric  Mansell M.D.   On: 11/01/2014 07:11    Anti-infectives: Anti-infectives    Start     Dose/Rate Route Frequency Ordered Stop   11/01/14 2000  vancomycin (VANCOCIN) 1,250 mg in sodium chloride 0.9 % 250 mL IVPB  Status:  Discontinued     1,250 mg166.7 mL/hr over 90 Minutes Intravenous Every 12 hours 11/01/14 0702 11/02/14 0823   10/30/14 2200  vancomycin (VANCOCIN) IVPB 1000 mg/200 mL premix  Status:  Discontinued     1,000 mg200 mL/hr over 60 Minutes Intravenous Every 8 hours 10/30/14 1746 11/01/14 0702   10/28/14 1800  vancomycin (VANCOCIN) 1,250 mg in sodium chloride 0.9 % 250 mL IVPB  Status:  Discontinued     1,250 mg166.7 mL/hr over 90 Minutes Intravenous Every 12 hours 10/28/14 0358 10/28/14 0824   10/28/14 1400  vancomycin (VANCOCIN) IVPB 1000 mg/200 mL premix  Status:  Discontinued     1,000 mg200 mL/hr over 60 Minutes Intravenous Every 12 hours 10/28/14 0824 10/30/14 1746   10/28/14 1000  micafungin (MYCAMINE) 100 mg in sodium chloride 0.9 % 100 mL IVPB  Status:  Discontinued     100 mg100 mL/hr over 1 Hours Intravenous Daily 10/28/14 0342 10/28/14 0344   10/28/14 0800  metroNIDAZOLE (FLAGYL) IVPB 500 mg  Status:  Discontinued     500 mg100 mL/hr over 60 Minutes Intravenous Every 8 hours 10/28/14 0335 10/28/14 0343   10/28/14 0600  piperacillin-tazobactam (ZOSYN) IVPB 3.375 g     3.375 g12.5 mL/hr over 240 Minutes Intravenous 3 times per day 10/28/14 0339     10/28/14 0400  micafungin (MYCAMINE) 100 mg in sodium chloride 0.9 % 100 mL IVPB     100 mg100 mL/hr over 1 Hours Intravenous Daily 10/28/14 0342     10/28/14 0400  vancomycin (VANCOCIN) 1,750 mg in sodium chloride 0.9 % 500 mL  IVPB     1,750 mg250 mL/hr over 120 Minutes Intravenous  Once 10/28/14 0353 10/28/14 0749   10/28/14 0030  [MAR Hold]  metroNIDAZOLE (FLAGYL) IVPB 500 mg     (MAR Hold since 10/28/14 0043)   500 mg100 mL/hr over 60 Minutes Intravenous  Once 10/28/14 0026 10/28/14 0200   10/27/14 2030  piperacillin-tazobactam (ZOSYN) IVPB 3.375 g     3.375 g100 mL/hr over 30 Minutes Intravenous  Once 10/27/14 2026 10/27/14 2110      Assessment/Plan: Problem List: Patient Active Problem List   Diagnosis Date Noted  . Shock circulatory 10/28/2014  . Acute respiratory failure with hypoxia 10/28/2014  . Acute post-operative pain 10/28/2014  . Solitary pulmonary nodule on lung CT 10/28/2014  . Sleep apnea 10/28/2014  . 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 , MD, FACS  [facility] [phone] beeper [phone]  11/02/2014 12:25 PM  

## 2014-11-02 NOTE — Progress Notes (Signed)
eLink Physician-Brief Progress Note Patient Name: William OuJoseph [name] DOB: 10/05/1951 MRN: [id number]   Date of Service  11/02/2014  HPI/Events of Note  Pt much worse, failing bipap  eICU Interventions  Plan reintubation, I will let [facility] MD on call know     Intervention Category Major Interventions: Respiratory failure - evaluation and management  William [name] 11/02/2014, 10:23 PM

## 2014-11-02 NOTE — Significant Event (Signed)
Pt extubated this AM.  Maintaining SpO2 with supplemental oxygen.  Agitation remains an issue.  Will add precedex and continue prn fentanyl.  Coralyn HellingVineet Latanja Lehenbauer, MD [facility] Pulmonary/Critical Care 11/02/2014, 9:45 AM Pager:  [phone] After 3pm call: [phone]

## 2014-11-02 NOTE — Progress Notes (Addendum)
PARENTERAL NUTRITION CONSULT NOTE   Pharmacy Consult for TPN Indication: Prolonged ileus  Allergies  Allergen Reactions  . Aloprim [Allopurinol] Other (See Comments)    Unknown reaction  . Simvastatin Other (See Comments)    Unknown reaction    Patient Measurements: Height: 5' 11.5" (181.6 cm) Weight: 282 lb 6.6 oz (128.1 kg) IBW/kg (Calculated) : 76.45 Adjusted Body Weight: 99kg Usual Weight: 114kg  Vital Signs: Temp: 100.3 F (37.9 C) (01/16 0403) Temp Source: Axillary (01/16 0403) BP: 104/65 mmHg (01/16 0600) Pulse Rate: 94 (01/16 0600) Intake/Output from previous day: 01/15 0701 - 01/16 0700 In: 3932.2 [I.V.:1337.9; NG/GT:204.3; IV Piggyback:550; TPN:1840] Out: 6465 [Urine:6225; Emesis/NG output:240] Intake/Output from this shift:    Labs:  Recent Labs  10/31/14 0300 11/01/14 0430  WBC 8.7 8.0  HGB 11.2* 11.9*  HCT 35.0* 36.8*  PLT 179 191     Recent Labs  10/31/14 0300 11/01/14 0430 11/02/14 0439  NA 136 135 130*  K 3.5 3.6 3.5  CL 98 97 93*  CO2 32 30 31  GLUCOSE 146* 148* 191*  BUN CREATININE 1.08 0.98 1.01  CALCIUM 7.5* 7.9* 7.8*  MG 2.2 2.1 2.0  PHOS 3.4 2.6 4.0  PROT 5.6*  --   --   ALBUMIN 2.6*  --   --   AST 18  --   --   ALT 16  --   --   ALKPHOS 36*  --   --   BILITOT 0.7  --   --   PREALBUMIN 9.7*  --   --   TRIG 270*  --   --   Corrected Ca = 8.9 Estimated Creatinine Clearance: 105.5 mL/min (by C-G formula based on Cr of 1.01).    Recent Labs  11/01/14 0543 11/01/14 1200 11/01/14 1746  GLUCAP 145* 161* 150*    Medical History: Past Medical History  Diagnosis Date  . Renal disorder     kidney stones  . Hypertension   . Thyroid disease     Insulin Requirements in past 24 hours:  11 units of moderate SSI Novolog  Current Nutrition: Clinimix-E 5/15 at 80 mL/hr Vital HP at 20 mL/hr (fixed rate) Receiving lipid calories from propofol infusion, amount variable depending on titrated rate  IVF:  KVO  Central access: PICC 10/30/14 TPN start date: 1/13  ASSESSMENT                                                                                                          HPI: 24 y/oM with HTN and nephrolithiasis who presented to [facility] on 1/10 with abdominal pain of 1 day duration secondary to a perforated sigmoid d/t diverticulitis. He was taken emergently to OR on 1/10 and is now s/p partial colectomy. Orders received to start TPN for anticipated prolonged ileus 1/13.  Significant events:  1/13: VDRF post-op remains on propofol (high needs) and pressor support with norepinephrine 1/14: Requiring max doses of propofol (35mcg/kg/min = 38.22ml/hr) 1/15: Propofol at 30 mcg/kg/min (22.9 mL/hr).  Vital HP tube feeding started  at fixed rate of 20 mL/hr 1/16: Propofol at 40 mcg/kg/min (30.5 mL/hr)  Today:   Glucose:  CBGs slightly elevated but < 200 on Novolog SSI q6h  Electrolytes: K still falling despite runs of 10mEq x 2 yesterday and x 4 the previous night. Anticipate requirements will increase as administration of insulin and Lasix have increased. Na slightly low (unable to adjust content of premixed Clinimix-E)  Renal: SCr and BUN WNL  I/O: -2.5 L (on Lasix 80mg  IV q8h), gastric output 0.2 L  LFTs: all below ULN on 1/14  TGs: 270 (1/14)  Prealbumin: 9.7 (1/14)  NUTRITIONAL GOALS                                                                                             RD recs: 150-160gm protein, 1800-2000kcal Clinimix-E 5/15 at a goal rate of 12420ml/hr + 20% fat emulsion at 6710ml/hr on MWF to provide: 144 g/day protein, avg of 2250 Kcal/day.  PLAN                                                                                                                         1. KCl 10mEq IV q1h x 4 2. At 1800 today:  Continue Clinimix-E 5/15 at 2880ml/hr (~66% of goal rate)  No lipids as currently on high rate of propofol.  Current propofol rate provides ~805 KCal/day.    Current  combination of Clinimix-E 5/15 plus lipid vehicle of propofol provides 96 grams protein/day,  2168 KCal/day  TPN to contain standard multivitamins and trace elements. 3. Continue Vital HP at fixed rate as ordered by surgery (20 mL/hr) 4. Increase CBGs and moderate SSI Novolog to q4h 5. BMet, Mg, Phos tomorrow. 6. Follow clinical course daily.  Elie Goodyandy Emmette Katt, PharmD, BCPS Pager: [phone] 11/02/2014  8:07 AM

## 2014-11-02 NOTE — Procedures (Signed)
Pt taken of bipap and placed on NRB for comfort. HR-92, RR-15, Sat-93

## 2014-11-02 NOTE — Progress Notes (Signed)
PULMONARY / CRITICAL CARE MEDICINE   Name: William [name] MRN: [id number] DOB: 02-09-1951    ADMISSION DATE:  10/27/2014  CHIEF COMPLAINT:  Perforated sigmoid colon  BRIEF PATIENT DESCRIPTION:   64 yo male former smoker presented with Abd pain from perforated sigmoid colon taken to OR emergently.  Remained on vent post op.  SIGNIFICANT EVENTS:  1/10  Admit, Partial colectomy  1/13  Off pressors 1/14  On TNA  STUDIES: 1/10  CT abd/pelvis >> sigmoid colon diverticulitis with perforation, umbilical hernia, b/l renal stones, 8 mm RLL nodule 1/11  ECHO >> EF 55-60%, nml systolic fxn, PA peak 32    SUBJECTIVE: Tolerating PS.  VITAL SIGNS: Temp:  [98.5 F (36.9 C)-100.8 F (38.2 C)] 100.3 F (37.9 C) (01/16 0403) Pulse Rate:  [93-109] 94 (01/16 0600) Resp:  [0-31] 14 (01/16 0600) BP: (102-166)/(58-94) 104/65 mmHg (01/16 0600) SpO2:  [90 %-97 %] 92 % (01/16 0600) FiO2 (%):  [40 %] 40 % (01/16 0403) Weight:  [282 lb 6.6 oz (128.1 kg)] 282 lb 6.6 oz (128.1 kg) (01/16 0403)   VENTILATOR SETTINGS: Vent Mode:  [-] PRVC FiO2 (%):  [40 %] 40 % Set Rate:  [14 bmp] 14 bmp Vt Set:  [610 mL] 610 mL PEEP:  [5 cmH20-8 cmH20] 8 cmH20 Pressure Support:  [5 cmH20] 5 cmH20 Plateau Pressure:  [15 cmH20-17 cmH20] 17 cmH20   INTAKE / OUTPUT: Intake/Output      01/15 0701 - 01/16 0700 01/16 0701 - 01/17 0700   I.V. (mL/kg) 1337.9 (10.4)    NG/GT 204.3    IV Piggyback 550    TPN 1840    Total Intake(mL/kg) 3932.2 (30.7)    Urine (mL/kg/hr) 6225 (2)    Emesis/NG output 240 (0.1)    Drains 0 (0)    Stool 0 (0)    Total Output 6465     Net -2532.8            PHYSICAL EXAMINATION: General: no distress Neuro: RASS -3 HEENT: ETT, NG in place Cardiovascular: regular Lungs: no wheeze Abdomen: soft, wound vac in place, LLQ colostomy Musculoskeletal: 1+ edema Skin: no rashes  LABS:  CBC  Recent Labs Lab 10/30/14 0345 10/31/14 0300 11/01/14 0430  WBC 9.1 8.7 8.0  HGB 11.5*  11.2* 11.9*  HCT 35.5* 35.0* 36.8*  PLT 159 179 191   Coag's  Recent Labs Lab 10/29/14 0550  INR 1.23   BMET  Recent Labs Lab 10/31/14 0300 11/01/14 0430 11/02/14 0439  NA 136 135 130*  K 3.5 3.6 3.5  CL 98 97 93*  CO2 32 30 31  BUN CREATININE 1.08 0.98 1.01  GLUCOSE 146* 148* 191*   Electrolytes  Recent Labs Lab 10/31/14 0300 11/01/14 0430 11/02/14 0439  CALCIUM 7.5* 7.9* 7.8*  MG 2.2 2.1 2.0  PHOS 3.4 2.6 4.0   Sepsis Markers  Recent Labs Lab 10/28/14 1800 10/29/14 0550 11/02/14 0439  LATICACIDVEN 1.9 1.5 1.1   ABG  Recent Labs Lab 10/28/14 0704  PHART 7.317*  PCO2ART 42.4  PO2ART 99.1   Liver Enzymes  Recent Labs Lab 10/28/14 0555 10/29/14 0550 10/31/14 0300  AST ALT ALKPHOS 33* 36* 36*  BILITOT 0.8 0.9 0.7  ALBUMIN 2.9* 2.8* 2.6*   Cardiac Enzymes  Recent Labs Lab 10/28/14 1230 10/28/14 1800 10/29/14 0550  TROPONINI <0.03 <0.03 <0.03   Glucose  Recent Labs Lab 10/31/14 1327 10/31/14 1741 10/31/14 2316 11/01/14  0543 11/01/14 1200 11/01/14 1746  GLUCAP 137* 133* 140* 145* 161* 150*    Imaging Dg Chest Port 1 View  11/01/2014   CLINICAL DATA:  Subsequent encounter for respiratory failure. Endotracheal tube placement.  EXAM: PORTABLE CHEST - 1 VIEW  COMPARISON:  10/31/2014  FINDINGS: 0450 hrs. Endotracheal tube tip is 6.4 cm above the base of the carina. The NG tube passes into the stomach although the distal tip position is not included on the film. Right PICC line tip overlies the distal SVC, near the SVC/RA junction. Lung volumes are low with bibasilar collapse/ consolidation and small bilateral pleural effusions. Pulmonary vascular congestion again noted without overt airspace pulmonary edema. The cardio pericardial silhouette is enlarged.  IMPRESSION: Cardiomegaly with bibasilar collapse/ consolidation and small effusions.  Stable position of support apparatus.   Electronically Signed   By:  Kennith CenterEric  Mansell M.D.   On: 11/01/2014 07:11    ASSESSMENT / PLAN:  PULMONARY ETT 1/10 >> A: Acute respiratory failure in setting of peritonitis. Hx of tobacco abuse with 8 mm RLL nodule on CT abd/pelvis. Hx of OSA. P:   Wean FiO2 to keep SaO2 > 92% Pressure support wean as tolerated F/u CXR  CARDIOVASCULAR L femoral CVC 1/11 >> RUE PICC 1/13 >>  A: Septic shock 2nd to peritonitis >> resolved. Troponin elevation. Hx of HTN. P:   Monitor hemodynamics Hold outpt lisinopril D/c femoral line when able  RENAL A: Lactic Acidosis  - resolved. Hypervolemia. Hypokalemia. P:   Continue lasix for negative fluid balance as tolerated Monitor renal fx, urine outpt, electrolytes  GASTROINTESTINAL A: Perforated Sigmoid s/p Partial Colectomy (1/10). Nutrition. P:   Post-op care, nutrition per [facility] Protonix for SUP  HEMATOLOGIC A: Anemia of critical illness. P: F/u CBC SQ heparin for DVT prevention  INFECTIOUS A: Septic shock 2nd to peritonitis from perforated sigmoid colon. P:   Day 5 zosyn, micafungin D/c vancomycin 1/16  Blood 1/11 >> Abd fluid 1/11 >>  ENDOCRINE A: Hyperglycemia. Hx of hypothyroidism.  P:   SSI  Continue synthroid IV  NEUROLOGIC A: Post-op pain control. P: RASS Goal: 0  Updated pt's wife at bedside.  CC time 40 minutes.  Coralyn HellingVineet Grayce Budden, MD [facility] Pulmonary/Critical [facility] 11/02/2014, 8:23 AM Pager:  [phone] After 3pm call: [phone]

## 2014-11-02 NOTE — Procedures (Signed)
Extubation Procedure Note  Patient Details:   Name: William [name] DOB: 06/26/1951 MRN: [id number]   Airway Documentation:     Evaluation Pt was placed on PS/CPAP by Dr. Craige CottaSood, time unknown.  Verbal order was given to extubate pt.  Pt was extubated placed on 6L [place] then changed to venturi @15L  with 55% oxygen.  O2 sats: stable throughout Complications: No apparent complications Patient did tolerate procedure well. Bilateral Breath Sounds: Coarse crackles, Diminished Suctioning: Airway No  Gala RomneyMackey, Mckennah Kretchmer M 11/02/2014, 8:59 AM

## 2014-11-02 NOTE — Procedures (Signed)
Intubation Procedure Note William [name] [id number] 06/29/1951  Procedure: Intubation Indications: Airway protection and maintenance  Procedure Details Consent: Unable to obtain consent because of altered level of consciousness. Time Out: Verified patient identification, verified procedure, site/side was marked, verified correct patient position, special equipment/implants available, medications/allergies/relevent history reviewed, required imaging and test results available.  Performed  4   Evaluation Hemodynamic Status: BP stable throughout; O2 sats: transiently fell during during procedure Patient's Current Condition: stable Complications: No apparent complications Patient did tolerate procedure well. Chest X-ray ordered to verify placement.  CXR: pending.   William [name] 11/02/2014

## 2014-11-03 ENCOUNTER — Encounter (HOSPITAL_COMMUNITY): Payer: Self-pay | Admitting: Radiology

## 2014-11-03 ENCOUNTER — Inpatient Hospital Stay (HOSPITAL_COMMUNITY): Payer: 59

## 2014-11-03 LAB — BLOOD GAS, ARTERIAL
ACID-BASE EXCESS: 5.8 mmol/L — AB (ref 0.0–2.0)
BICARBONATE: 30.2 meq/L — AB (ref 20.0–24.0)
Drawn by: 232811
FIO2: 1 %
MECHVT: 610 mL
O2 Saturation: 96.4 %
PCO2 ART: 45.2 mmHg — AB (ref 35.0–45.0)
PEEP: 10 cmH2O
Patient temperature: 99.5
RATE: 18 resp/min
TCO2: 26.4 mmol/L (ref 0–100)
pH, Arterial: 7.443 (ref 7.350–7.450)
pO2, Arterial: 86.6 mmHg (ref 80.0–100.0)

## 2014-11-03 LAB — BASIC METABOLIC PANEL
Anion gap: 11 (ref 5–15)
BUN: 27 mg/dL — ABNORMAL HIGH (ref 6–23)
CHLORIDE: 95 meq/L — AB (ref 96–112)
CO2: 33 mmol/L — ABNORMAL HIGH (ref 19–32)
Calcium: 8.5 mg/dL (ref 8.4–10.5)
Creatinine, Ser: 1.14 mg/dL (ref 0.50–1.35)
GFR, EST AFRICAN AMERICAN: 78 mL/min — AB (ref >90)
GFR, EST NON AFRICAN AMERICAN: 68 mL/min — AB (ref >90)
Glucose, Bld: 182 mg/dL — ABNORMAL HIGH (ref 70–99)
POTASSIUM: 3.5 mmol/L (ref 3.5–5.1)
Sodium: 139 mmol/L (ref 135–145)

## 2014-11-03 LAB — GLUCOSE, CAPILLARY
GLUCOSE-CAPILLARY: 167 mg/dL — AB (ref 70–99)
GLUCOSE-CAPILLARY: 198 mg/dL — AB (ref 70–99)
Glucose-Capillary: 172 mg/dL — ABNORMAL HIGH (ref 70–99)
Glucose-Capillary: 181 mg/dL — ABNORMAL HIGH (ref 70–99)

## 2014-11-03 LAB — PHOSPHORUS: Phosphorus: 3.4 mg/dL (ref 2.3–4.6)

## 2014-11-03 LAB — CULTURE, BLOOD (ROUTINE X 2)
Culture: NO GROWTH
Culture: NO GROWTH

## 2014-11-03 LAB — CBC
HEMATOCRIT: 40.5 % (ref 39.0–52.0)
HEMOGLOBIN: 12.9 g/dL — AB (ref 13.0–17.0)
MCH: 31.1 pg (ref 26.0–34.0)
MCHC: 31.9 g/dL (ref 30.0–36.0)
MCV: 97.6 fL (ref 78.0–100.0)
Platelets: 263 10*3/uL (ref 150–400)
RBC: 4.15 MIL/uL — AB (ref 4.22–5.81)
RDW: 14.1 % (ref 11.5–15.5)
WBC: 8.2 10*3/uL (ref 4.0–10.5)

## 2014-11-03 LAB — MAGNESIUM: MAGNESIUM: 2.2 mg/dL (ref 1.5–2.5)

## 2014-11-03 MED ORDER — POTASSIUM CHLORIDE 10 MEQ/100ML IV SOLN
10.0000 meq | INTRAVENOUS | Status: AC
  Administered 2014-11-03 (×4): 10 meq via INTRAVENOUS
  Filled 2014-11-03 (×4): qty 100

## 2014-11-03 MED ORDER — FUROSEMIDE 10 MG/ML IJ SOLN: 40.0000 mg | Freq: Once | INTRAMUSCULAR | Status: DC

## 2014-11-03 MED ORDER — DEXMEDETOMIDINE HCL IN NACL 400 MCG/100ML IV SOLN
0.0000 ug/kg/h | INTRAVENOUS | Status: DC
  Administered 2014-11-03 (×6): 1.2 ug/kg/h via INTRAVENOUS
  Administered 2014-11-04: 1.5 ug/kg/h via INTRAVENOUS
  Administered 2014-11-04 (×5): 1.2 ug/kg/h via INTRAVENOUS
  Administered 2014-11-04 (×2): 1.5 ug/kg/h via INTRAVENOUS
  Administered 2014-11-04: 1.2 ug/kg/h via INTRAVENOUS
  Administered 2014-11-05: 1.5 ug/kg/h via INTRAVENOUS
  Filled 2014-11-03 (×5): qty 100
  Filled 2014-11-03: qty 50
  Filled 2014-11-03 (×11): qty 100

## 2014-11-03 MED ORDER — TRACE MINERALS CR-CU-F-FE-I-MN-MO-SE-ZN IV SOLN
INTRAVENOUS | Status: AC
  Administered 2014-11-03: 18:00:00 via INTRAVENOUS
  Filled 2014-11-03: qty 2280

## 2014-11-03 NOTE — Procedures (Signed)
At 11:32, the pt was placed on full support and then transported from the unit to CT then back to unit without complications. Pt was then placed back on ps/cpap.

## 2014-11-03 NOTE — Progress Notes (Signed)
PULMONARY / CRITICAL CARE MEDICINE   Name: Johnney OuJoseph Aung MRN: [id number] DOB: 03/29/1951    ADMISSION DATE:  10/27/2014  CHIEF COMPLAINT:  Perforated sigmoid colon  BRIEF PATIENT DESCRIPTION:   64 yo male former smoker presented with Abd pain from perforated sigmoid colon taken to OR emergently.  Remained on vent post op.  SIGNIFICANT EVENTS:  1/10  Admit, Partial colectomy  1/13  Off pressors 1/14  On TNA 1/16  Extubated >> reintubated in PM  STUDIES: 1/10  CT abd/pelvis >> sigmoid colon diverticulitis with perforation, umbilical hernia, b/l renal stones, 8 mm RLL nodule 1/11  ECHO >> EF 55-60%, nml systolic fxn, PA peak 32    SUBJECTIVE: He was reintubated overnight >> not entirely clear what changed except for more agitation.  VITAL SIGNS: Temp:  [97.7 F (36.5 C)-99.6 F (37.6 C)] 99.1 F (37.3 C) (01/17 0400) Pulse Rate:  [70-118] 79 (01/17 0600) Resp:  [15-38] 18 (01/17 0600) BP: (107-167)/(62-112) 112/65 mmHg (01/17 0600) SpO2:  [89 %-98 %] 97 % (01/17 0600) FiO2 (%):  [50 %-100 %] 80 % (01/17 0735) Weight:  [279 lb 8.7 oz (126.8 kg)] 279 lb 8.7 oz (126.8 kg) (01/17 0500)   VENTILATOR SETTINGS: Vent Mode:  [-] PRVC FiO2 (%):  [50 %-100 %] 80 % Set Rate:  [18 bmp-20 bmp] 18 bmp Vt Set:  [610 mL] 610 mL PEEP:  [6 cmH20-10 cmH20] 10 cmH20 Pressure Support:  [6 cmH20] 6 cmH20 Plateau Pressure:  [20 cmH20-21 cmH20] 20 cmH20   INTAKE / OUTPUT: Intake/Output      01/16 0701 - 01/17 0700 01/17 0701 - 01/18 0700   I.V. (mL/kg) 1262.2 (10)    NG/GT     IV Piggyback 600    TPN 1906.7    Total Intake(mL/kg) 3768.9 (29.7)    Urine (mL/kg/hr) 5425 (1.8)    Emesis/NG output 350 (0.1)    Drains 0 (0)    Stool 0 (0)    Total Output 5775     Net -2006.1            PHYSICAL EXAMINATION: General: no distress Neuro: RASS -3 HEENT: ETT, NG in place, pupils reactive Cardiovascular: regular Lungs: no wheeze Abdomen: soft, wound vac in place, LLQ  colostomy Musculoskeletal: 1+ edema Skin: no rashes  LABS:  CBC  Recent Labs Lab 10/31/14 0300 11/01/14 0430 11/03/14 0443  WBC 8.7 8.0 8.2  HGB 11.2* 11.9* 12.9*  HCT 35.0* 36.8* 40.5  PLT 179 191 263   Coag's  Recent Labs Lab 10/29/14 0550  INR 1.23   BMET  Recent Labs Lab 10/31/14 0300 11/01/14 0430 11/02/14 0439  NA 136 135 130*  K 3.5 3.6 3.5  CL 98 97 93*  CO2 32 30 31  BUN 13 16 20   CREATININE 1.08 0.98 1.01  GLUCOSE 146* 148* 191*   Electrolytes  Recent Labs Lab 10/31/14 0300 11/01/14 0430 11/02/14 0439  CALCIUM 7.5* 7.9* 7.8*  MG 2.2 2.1 2.0  PHOS 3.4 2.6 4.0   Sepsis Markers  Recent Labs Lab 10/28/14 1800 10/29/14 0550 11/02/14 0439  LATICACIDVEN 1.9 1.5 1.1   ABG  Recent Labs Lab 10/28/14 0704 11/03/14 0250  PHART 7.317* 7.443  PCO2ART 42.4 45.2*  PO2ART 99.1 86.6   Liver Enzymes  Recent Labs Lab 10/28/14 0555 10/29/14 0550 10/31/14 0300  AST 26 19 18   ALT 23 21 16   ALKPHOS 33* 36* 36*  BILITOT 0.8 0.9 0.7  ALBUMIN 2.9* 2.8* 2.6*   Cardiac Enzymes  Recent Labs Lab 10/28/14 1230 10/28/14 1800 10/29/14 0550  TROPONINI <0.03 <0.03 <0.03   Glucose  Recent Labs Lab 11/01/14 1200 11/01/14 1746 11/01/14 2315 11/02/14 1215 11/02/14 1640 11/02/14 2017  GLUCAP 161* 150* 184* 206* 176* 171*    Imaging Dg Chest Port 1 View  11/03/2014   CLINICAL DATA:  Subsequent encounter for atelectasis. Hypertension. Partial colectomy.  EXAM: PORTABLE CHEST - 1 VIEW  COMPARISON:  1 day prior  FINDINGS: Support apparatus: Endotracheal tube 4.3 cm above carina. Nasogastric tube extends beyond the inferior aspect of the film. Right-sided PICC line terminating at high right atrium.  Cardiomediastinal silhouette: Midline trachea. Cardiomegaly accentuated by AP portable technique.  Pleura:  Probable small left pleural effusion.  No pneumothorax.  Lungs: Mild interstitial edema is similar. Left greater than right bibasilar airspace  disease is not significantly changed.  Other: None  IMPRESSION: No significant change since one day prior.  Mild congestive heart failure with left pleural effusion and bibasilar airspace disease. Most likely atelectasis. Left base infection cannot be excluded.   Electronically Signed   By: Jeronimo Greaves M.D.   On: 11/03/2014 07:14   Dg Chest Port 1 View  11/02/2014   CLINICAL DATA:  Endotracheal tube placement.  Initial encounter.  EXAM: PORTABLE CHEST - 1 VIEW  COMPARISON:  Chest radiograph performed 11/01/2014  FINDINGS: The patient's endotracheal tube is seen ending 4 cm above the carina. An enteric tube is noted extending below the diaphragm. A right PICC is noted ending about the cavoatrial junction.  Patchy bilateral airspace opacities may reflect mild pulmonary edema or pneumonia. Vascular congestion is noted. An accessory azygos lobe is again seen. The left lung base is not well characterized. A small left pleural effusion is suspected. No pneumothorax is seen.  The cardiomediastinal silhouette is borderline normal in size. No acute osseous abnormalities are seen.  IMPRESSION: 1. Endotracheal tube seen ending 4 cm above the carina. 2. Patchy bilateral airspace opacities may reflect mild pulmonary edema or pneumonia. Vascular congestion noted. Small left pleural effusion suspected.   Electronically Signed   By: Roanna Raider M.D.   On: 11/02/2014 23:49    ASSESSMENT / PLAN:  PULMONARY ETT 1/10 >> 1/16, 1/16 >> A: Acute respiratory failure in setting of peritonitis. Hx of tobacco abuse with 8 mm RLL nodule on CT abd/pelvis. Hx of OSA. P:   Wean FiO2 to keep SaO2 > 90% Pressure support wean as tolerated >> I am concerned he will need trach to come off vent due to issues with agitation F/u CXR  CARDIOVASCULAR L femoral CVC 1/11 >> RUE PICC 1/13 >>  A: Septic shock 2nd to peritonitis >> resolved. Troponin elevation. Hx of HTN. P:   Monitor hemodynamics Hold outpt lisinopril D/c  femoral line when able  RENAL A: Lactic Acidosis  - resolved. Hypervolemia. Hypokalemia. P:   Continue lasix for negative fluid balance as tolerated Monitor renal fx, urine outpt, electrolytes  GASTROINTESTINAL A: Perforated Sigmoid s/p Partial Colectomy (1/10). Nutrition. P:   Post-op care, nutrition per [facility] Protonix for SUP  HEMATOLOGIC A: Anemia of critical illness. P: F/u CBC SQ heparin for DVT prevention  INFECTIOUS A: Septic shock 2nd to peritonitis from perforated sigmoid colon. P:   Day 6 zosyn, micafungin  Blood 1/11 >>  ENDOCRINE A: Hyperglycemia. Hx of hypothyroidism.  P:   SSI  Continue synthroid IV  NEUROLOGIC A: Post-op pain control. Agitation. P: RASS Goal: 0 Check CT head 1/17  Summary: He was extubated  1/16 >> re-intubated overnight.  As best I can tell, main issue was agitation that led to need for re-intubation.  He is back to FiO2 40%, PEEP 5, and tolerating pressure support again this morning.  If issues with agitation do not resolve quickly, then I am concerned he might need trach to assist with vent weaning.  CC time 40 minutes.  Coralyn Helling, MD [facility] Pulmonary/Critical Care 11/03/2014, 8:08 AM Pager:  [phone] After 3pm call: [phone]

## 2014-11-03 NOTE — Progress Notes (Signed)
General Surgery Note  LOS: 7 days  POD -  6 Days Post-Op  Assessment/Plan: 1.  PARTIAL COLECTOMY Sigmoid, COLOSTOMY, EXPLORATORY LAPAROTOMY - 10/28/2014 - P. Toth  On Zosyn  WBC - 8,200 - 11/03/2014  Quiet abdomen - but air in ostomy bag  2.  Respiratory failure  Reintubated last PM  Vent management per Dr. Craige Cotta. 3.  History of OSA 4.  Severe malnutrition  On TPN - will hold feedings for now  5.  DVT prophylaxis - SQ Heparin 6.  Because of confusion/trouble remaining extubated - Dr. Craige Cotta has ordered head CT scan.   Principal Problem:   Diverticulitis of colon with perforation s/p colectomy/colostomy 10/28/2014 Active Problems:   Shock circulatory   Acute respiratory failure with hypoxia   Acute post-operative pain   Solitary pulmonary nodule on lung CT   Sleep apnea  Subjective:  Intubated.   Wife, Waynetta Sandy, at bedside.   Objective:   Filed Vitals:   11/03/14 0800  BP:   Pulse:   Temp: 98.7 F (37.1 C)  Resp:      Intake/Output from previous day:  01/16 0701 - 01/17 0700 In: 3768.9 [I.V.:1262.2; IV Piggyback:600; TPN:1906.7] Out: 5775 [Urine:5425; Emesis/NG output:350]  Intake/Output this shift:      Physical Exam:   General: Obese intubated older male.   HEENT: Normal. Pupils equal. .   Lungs: on vent   Abdomen: Large.  Quiet   Wound: LLQ ostomy - minimal air in bag, no stool.  Wound with VAC.     Lab Results:    Recent Labs  11/01/14 0430 11/03/14 0443  WBC 8.0 8.2  HGB 11.9* 12.9*  HCT 36.8* 40.5  PLT 191 263    BMET   Recent Labs  11/02/14 0439 11/03/14 0443  NA 130* 139  K 3.5 3.5  CL 93* 95*  CO2 31 33*  GLUCOSE 191* 182*  BUN 20 27*  CREATININE 1.01 1.14  CALCIUM 7.8* 8.5    PT/INR  No results for input(s): LABPROT, INR in the last 72 hours.  ABG   Recent Labs  11/03/14 0250  PHART 7.443  HCO3 30.2*     Studies/Results:  Dg Chest Port 1 View  11/03/2014   CLINICAL DATA:  Subsequent encounter for atelectasis.  Hypertension. Partial colectomy.  EXAM: PORTABLE CHEST - 1 VIEW  COMPARISON:  1 day prior  FINDINGS: Support apparatus: Endotracheal tube 4.3 cm above carina. Nasogastric tube extends beyond the inferior aspect of the film. Right-sided PICC line terminating at high right atrium.  Cardiomediastinal silhouette: Midline trachea. Cardiomegaly accentuated by AP portable technique.  Pleura:  Probable small left pleural effusion.  No pneumothorax.  Lungs: Mild interstitial edema is similar. Left greater than right bibasilar airspace disease is not significantly changed.  Other: None  IMPRESSION: No significant change since one day prior.  Mild congestive heart failure with left pleural effusion and bibasilar airspace disease. Most likely atelectasis. Left base infection cannot be excluded.   Electronically Signed   By: Jeronimo Greaves M.D.   On: 11/03/2014 07:14   Dg Chest Port 1 View  11/02/2014   CLINICAL DATA:  Endotracheal tube placement.  Initial encounter.  EXAM: PORTABLE CHEST - 1 VIEW  COMPARISON:  Chest radiograph performed 11/01/2014  FINDINGS: The patient's endotracheal tube is seen ending 4 cm above the carina. An enteric tube is noted extending below the diaphragm. A right PICC is noted ending about the cavoatrial junction.  Patchy bilateral airspace opacities may reflect mild  pulmonary edema or pneumonia. Vascular congestion is noted. An accessory azygos lobe is again seen. The left lung base is not well characterized. A small left pleural effusion is suspected. No pneumothorax is seen.  The cardiomediastinal silhouette is borderline normal in size. No acute osseous abnormalities are seen.  IMPRESSION: 1. Endotracheal tube seen ending 4 cm above the carina. 2. Patchy bilateral airspace opacities may reflect mild pulmonary edema or pneumonia. Vascular congestion noted. Small left pleural effusion suspected.   Electronically Signed   By: Roanna RaiderJeffery  Chang M.D.   On: 11/02/2014 23:49      Anti-infectives:   Anti-infectives    Start     Dose/Rate Route Frequency Ordered Stop   11/01/14 2000  vancomycin (VANCOCIN) 1,250 mg in sodium chloride 0.9 % 250 mL IVPB  Status:  Discontinued     1,250 mg166.7 mL/hr over 90 Minutes Intravenous Every 12 hours 11/01/14 0702 11/02/14 0823   10/30/14 2200  vancomycin (VANCOCIN) IVPB 1000 mg/200 mL premix  Status:  Discontinued     1,000 mg200 mL/hr over 60 Minutes Intravenous Every 8 hours 10/30/14 1746 11/01/14 0702   10/28/14 1800  vancomycin (VANCOCIN) 1,250 mg in sodium chloride 0.9 % 250 mL IVPB  Status:  Discontinued     1,250 mg166.7 mL/hr over 90 Minutes Intravenous Every 12 hours 10/28/14 0358 10/28/14 0824   10/28/14 1400  vancomycin (VANCOCIN) IVPB 1000 mg/200 mL premix  Status:  Discontinued     1,000 mg200 mL/hr over 60 Minutes Intravenous Every 12 hours 10/28/14 0824 10/30/14 1746   10/28/14 1000  micafungin (MYCAMINE) 100 mg in sodium chloride 0.9 % 100 mL IVPB  Status:  Discontinued     100 mg100 mL/hr over 1 Hours Intravenous Daily 10/28/14 0342 10/28/14 0344   10/28/14 0800  metroNIDAZOLE (FLAGYL) IVPB 500 mg  Status:  Discontinued     500 mg100 mL/hr over 60 Minutes Intravenous Every 8 hours 10/28/14 0335 10/28/14 0343   10/28/14 0600  piperacillin-tazobactam (ZOSYN) IVPB 3.375 g     3.375 g12.5 mL/hr over 240 Minutes Intravenous 3 times per day 10/28/14 0339     10/28/14 0400  micafungin (MYCAMINE) 100 mg in sodium chloride 0.9 % 100 mL IVPB     100 mg100 mL/hr over 1 Hours Intravenous Daily 10/28/14 0342     10/28/14 0400  vancomycin (VANCOCIN) 1,750 mg in sodium chloride 0.9 % 500 mL IVPB     1,750 mg250 mL/hr over 120 Minutes Intravenous  Once 10/28/14 0353 10/28/14 0749   10/28/14 0030  [MAR Hold]  metroNIDAZOLE (FLAGYL) IVPB 500 mg     (MAR Hold since 10/28/14 0043)   500 mg100 mL/hr over 60 Minutes Intravenous  Once 10/28/14 0026 10/28/14 0200   10/27/14 2030  piperacillin-tazobactam (ZOSYN) IVPB 3.375 g      3.375 g100 mL/hr over 30 Minutes Intravenous  Once 10/27/14 2026 10/27/14 2110      Ovidio Kinavid Dnyla Antonetti, MD, FACS Pager: [phone] [facility] [place] [facility] Office: [phone] 11/03/2014

## 2014-11-03 NOTE — Progress Notes (Signed)
NUTRITION FOLLOW UP/TF Consult  Intervention:   -TPN per pharmacy -Diet advancement per MD  If TF warranted: -Recommend Initiate Vital HP@ 20 ml/hr via OGT and increase by 10 ml every 4 hours to goal rate of 75 ml/hr.   Tube feeding regimen provides 1800 kcal (73% of needs), 158 grams of protein, and 1505 ml of H2O.   -RD to continue to monitor  Nutrition Dx:   Inadequate oral intake related to inability to eat as evidenced by NPO; ongoing  Goal:   Parenteral nutrition to provide 60-70% of estimated calorie needs (22-25 kcals/kg ideal body weight) and 100% of estimated protein needs, based on ASPEN guidelines for hypocaloric, high protein feeding in critically ill obese individuals  Monitor:   TPN tolerance, TF initiation, GI profile, weight trend, vent status/settings, labs  Assessment:   1/11: Pt s/p partial colectomy 10/27/14.  - Pt with bleeding from surgical sites.  - Spoke with pt's wife who reported a recent increase in pt's weight (30 lbs in 2-3 months) due to "stress eating." She says that pt will often eat lunch that she packs for him at work as well as food provided to him. She reports an increase in the consumption of starchy foods such as potato chips and breads.   1/13: -Pt remains intubated and sedated, gradually wean of vent and pressors d/t ARDs and obesity/distension per MD note -RD consulted for initiation of TPN  -Wt increased 13 lbs since 1/11; likely d/t 5.7L fluid resuscitation; currently +800 ml output -WOC following for ostomy care -Monitor triglycerides with propofol, may need to administer TPN lipids 2-3x/week vs every day -CBG elevated > 150 mg/dL, monitor with initiation of TPN -K elevated at 6.1, now WNL -Low Phos, being repleted with [facility]  1/17 -RD consulted for TF initiation and management -1/16 Pt was extubated, but failed. Pt re-intubated later on 1/16. -Pt was started on Vital HP at 20 ml/hr but that was d/c along with propofol  infusion. -Spoke with student RN who is to have RN contact RD regarding plans for TF -Pt is currently receiving Clinimix-E 5/15 at 80 mL/hr (~66% of goal rate). Per pharmacy, TPN will be increased to 95 ml/hr at 1800 today. -Wt is back down to admission weight -CBGs still >150 mg/dL -BUN elevated, Mg/Phos WNL  Patient is currently intubated on ventilator support MV: 13.5 L/min Temp (24hrs), Avg:98.8 F (37.1 C), Min:97.7 F (36.5 C), Max:99.6 F (37.6 C)  Propofol: none  Height: Ht Readings from Last 1 Encounters:  10/28/14 5' 11.5" (1.816 m)    Weight Status:   Wt Readings from Last 1 Encounters:  11/03/14 279 lb 8.7 oz (126.8 kg)  10/30/14 293 lb 10/27/14 280 lb  Re-estimated needs(1/17):  Kcal: 2473 (1800-2000 kcal = 22-25 kcal/kg IBW) Protein:~160 gram protein Fluid: per MD  Skin:  Surgical incision on abd, +1 generalized edema, +3 perineal edema  Diet Order: .TPN (CLINIMIX-E) Adult Diet NPO time specified   Intake/Output Summary (Last 24 hours) at 11/03/14 0934 Last data filed at 11/03/14 0600  Gross per 24 hour  Intake 3427.87 ml  Output   5775 ml  Net -2347.13 ml    Last BM: 1/11   Labs:   Recent Labs Lab 11/01/14 0430 11/02/14 0439 11/03/14 0443  NA 135 130* 139  K 3.6 3.5 3.5  CL 97 93* 95*  CO2 30 31 33*  BUN 16 20 27*  CREATININE 0.98 1.01 1.14  CALCIUM 7.9* 7.8* 8.5  MG 2.1 2.0  2.2  PHOS 2.6 4.0 3.4  GLUCOSE 148* 191* 182*    CBG (last 3)   Recent Labs  11/02/14 2017 11/03/14 0046 11/03/14 0822  GLUCAP 171* 172* 181*    Scheduled Meds: . antiseptic oral rinse  7 mL Mouth Rinse QID  . chlorhexidine  15 mL Mouth Rinse BID  . heparin  5,000 Units Subcutaneous 3 times per day  . insulin aspart  0-15 Units Subcutaneous 6 times per day  . levothyroxine  75 mcg Intravenous Daily  . lidocaine (cardiac) 100 mg/245ml      . lip balm  1 application Topical BID  . micafungin [facility](MYCAMINE) IV  100 mg Intravenous Q0600  . pantoprazole  (PROTONIX) IV  40 mg Intravenous Q24H  . piperacillin-tazobactam (ZOSYN)  IV  3.375 g Intravenous 3 times per day  . rocuronium      . sodium chloride  10-40 mL Intracatheter Q12H  . succinylcholine        Continuous Infusions: . Marland Kitchen.TPN (CLINIMIX-E) Adult 80 mL/hr at 11/03/14 0600  . dexmedetomidine 1.2 mcg/kg/hr (11/03/14 0926)    Tilda FrancoLindsey Shailene Demonbreun, MS, RD, LDN Pager: [phone] After Hours Pager: [phone]

## 2014-11-03 NOTE — Progress Notes (Signed)
PARENTERAL NUTRITION CONSULT NOTE   Pharmacy Consult for TPN Indication: Prolonged ileus  Allergies  Allergen Reactions  . Aloprim [Allopurinol] Other (See Comments)    Unknown reaction  . Simvastatin Other (See Comments)    Unknown reaction    Patient Measurements: Height: 5' 11.5" (181.6 cm) Weight: 279 lb 8.7 oz (126.8 kg) IBW/kg (Calculated) : 76.45 Adjusted Body Weight: 99kg Usual Weight: 114kg  Vital Signs: Temp: 98.7 F (37.1 C) (01/17 0800) Temp Source: Oral (01/17 0800) BP: 112/65 mmHg (01/17 0600) Pulse Rate: 79 (01/17 0600) Intake/Output from previous day: 01/16 0701 - 01/17 0700 In: 3768.9 [I.V.:1262.2; IV Piggyback:600; TPN:1906.7] Out: 5775 [Urine:5425; Emesis/NG output:350] Intake/Output from this shift:    Labs:  Recent Labs  11/01/14 0430 11/03/14 0443  WBC 8.0 8.2  HGB 11.9* 12.9*  HCT 36.8* 40.5  PLT 191 263     Recent Labs  11/01/14 0430 11/02/14 0439 11/03/14 0443  NA 135 130* 139  K 3.6 3.5 3.5  CL 97 93* 95*  CO2 30 31 33*  GLUCOSE 148* 191* 182*  BUN 16 20 27*  CREATININE 0.98 1.01 1.14  CALCIUM 7.9* 7.8* 8.5  MG 2.1 2.0 2.2  PHOS 2.6 4.0 3.4  Corrected Ca = 8.9 Estimated Creatinine Clearance: 104.9 mL/min (by C-G formula based on Cr of 1.01).    Recent Labs  11/02/14 2017 11/03/14 0046 11/03/14 0822  GLUCAP 171* 172* 181*    Medical History: Past Medical History  Diagnosis Date  . Renal disorder     kidney stones  . Hypertension   . Thyroid disease     Insulin Requirements in past 24 hours:  17 units of moderate SSI Novolog  Current Nutrition: Clinimix-E 5/15 at 80 mL/hr NPO  IVF: [facility] access: PICC 10/30/14 TPN start date: 1/13  ASSESSMENT                                                                                                          HPI: 26 y/oM with HTN and nephrolithiasis who presented to [facility] on 1/10 with abdominal pain of 1 day duration secondary to a perforated sigmoid d/t  diverticulitis. He was taken emergently to OR on 1/10 and is now s/p partial colectomy. Orders received to start TPN for anticipated prolonged ileus 1/13.  Significant events:  1/13: VDRF post-op remains on propofol (high needs) and pressor support with norepinephrine 1/14: Requiring max doses of propofol (25mcg/kg/min = 38.45ml/hr) 1/15: Propofol at 30 mcg/kg/min (22.9 mL/hr).  Vital HP tube feeding started at fixed rate of 20 mL/hr 1/16: Propofol at 40 mcg/kg/min (30.5 mL/hr).  In PM was extubated but required reintubation due to agitation.  1/17: Now off TF and propofol.  Was placed on Precedex drip for sedation yesterday PM.  Today:   Glucose:  CBGs slightly elevated but < 200 on Novolog SSI q4h  Electrolytes: K still borderline low despite 4 runs KCl yesterday. (Has recently been requiring more insulin and Lasix).  Cl low, bicarb elevated.  Renal: SCr WNL, BUN elevated.  I/O: -2L, gastric  output 0.35 L  LFTs: all below ULN on 1/14  TGs: 270 (1/14)  Prealbumin: 9.7 (1/14)  NUTRITIONAL GOALS                                                                                             RD recs: ~150-160gm protein/d, 1800-2000 kcal/d Clinimix-E 5/15 at a goal rate of 16920ml/hr + 20% fat emulsion at 3110ml/hr on Monday and Thursdays to provide: 144 g/day protein, avg of 2180 Kcal/day.  PLAN                                                                                                                         1. KCl 10mEq IV q1h x 4 2. At 1800 today:  Increase Clinimix-E 5/15 to 95 mL/hr  No Fat Emulsion today (Sunday), but plan to resume tomorrow on Monday/Thursday schedule.    TPN to contain standard multivitamins and trace elements. 3. Continue CBGs and moderate SSI Novolog q4h 4. Full TPN lab panels on Mondays and Thursdays (next due tomorrow). 5. Follow clinical course daily.  Elie Goodyandy Cheo Selvey, PharmD, BCPS Pager: [phone] 11/03/2014  8:49 AM

## 2014-11-04 ENCOUNTER — Inpatient Hospital Stay (HOSPITAL_COMMUNITY): Payer: 59

## 2014-11-04 DIAGNOSIS — G9340 Encephalopathy, unspecified: Secondary | ICD-10-CM

## 2014-11-04 DIAGNOSIS — K659 Peritonitis, unspecified: Secondary | ICD-10-CM

## 2014-11-04 DIAGNOSIS — R579 Shock, unspecified: Secondary | ICD-10-CM

## 2014-11-04 LAB — DIFFERENTIAL
BASOS ABS: 0 10*3/uL (ref 0.0–0.1)
BASOS PCT: 0 % (ref 0–1)
Eosinophils Absolute: 0.2 10*3/uL (ref 0.0–0.7)
Eosinophils Relative: 2 % (ref 0–5)
Lymphocytes Relative: 13 % (ref 12–46)
Lymphs Abs: 1.2 10*3/uL (ref 0.7–4.0)
MONOS PCT: 9 % (ref 3–12)
Monocytes Absolute: 0.9 10*3/uL (ref 0.1–1.0)
NEUTROS ABS: 7.3 10*3/uL (ref 1.7–7.7)
Neutrophils Relative %: 76 % (ref 43–77)

## 2014-11-04 LAB — COMPREHENSIVE METABOLIC PANEL
ALK PHOS: 62 U/L (ref 39–117)
ALT: 24 U/L (ref 0–53)
AST: 23 U/L (ref 0–37)
Albumin: 2.7 g/dL — ABNORMAL LOW (ref 3.5–5.2)
Anion gap: 10 (ref 5–15)
BILIRUBIN TOTAL: 0.9 mg/dL (ref 0.3–1.2)
BUN: 25 mg/dL — ABNORMAL HIGH (ref 6–23)
CO2: 29 mmol/L (ref 19–32)
Calcium: 8.4 mg/dL (ref 8.4–10.5)
Chloride: 100 mEq/L (ref 96–112)
Creatinine, Ser: 0.94 mg/dL (ref 0.50–1.35)
GFR calc Af Amer: >90 mL/min (ref >90)
GFR calc non Af Amer: 88 mL/min — ABNORMAL LOW (ref >90)
GLUCOSE: 183 mg/dL — AB (ref 70–99)
Potassium: 3.9 mmol/L (ref 3.5–5.1)
Sodium: 139 mmol/L (ref 135–145)
TOTAL PROTEIN: 6.3 g/dL (ref 6.0–8.3)

## 2014-11-04 LAB — GLUCOSE, CAPILLARY
GLUCOSE-CAPILLARY: 173 mg/dL — AB (ref 70–99)
GLUCOSE-CAPILLARY: 195 mg/dL — AB (ref 70–99)
GLUCOSE-CAPILLARY: 168 mg/dL — AB (ref 70–99)
Glucose-Capillary: 163 mg/dL — ABNORMAL HIGH (ref 70–99)
Glucose-Capillary: 167 mg/dL — ABNORMAL HIGH (ref 70–99)
Glucose-Capillary: 178 mg/dL — ABNORMAL HIGH (ref 70–99)
Glucose-Capillary: 192 mg/dL — ABNORMAL HIGH (ref 70–99)

## 2014-11-04 LAB — PHOSPHORUS: Phosphorus: 3.1 mg/dL (ref 2.3–4.6)

## 2014-11-04 LAB — ANAEROBIC CULTURE

## 2014-11-04 LAB — CBC
HCT: 39.6 % (ref 39.0–52.0)
Hemoglobin: 12.6 g/dL — ABNORMAL LOW (ref 13.0–17.0)
MCH: 31.3 pg (ref 26.0–34.0)
MCHC: 31.8 g/dL (ref 30.0–36.0)
MCV: 98.3 fL (ref 78.0–100.0)
PLATELETS: 283 10*3/uL (ref 150–400)
RBC: 4.03 MIL/uL — AB (ref 4.22–5.81)
RDW: 14.2 % (ref 11.5–15.5)
WBC: 9.7 10*3/uL (ref 4.0–10.5)

## 2014-11-04 LAB — TRIGLYCERIDES: TRIGLYCERIDES: 191 mg/dL — AB (ref <150)

## 2014-11-04 LAB — PREALBUMIN: Prealbumin: 15.9 mg/dL — ABNORMAL LOW (ref 17.0–34.0)

## 2014-11-04 LAB — MAGNESIUM: MAGNESIUM: 2.1 mg/dL (ref 1.5–2.5)

## 2014-11-04 MED ORDER — SODIUM CHLORIDE 0.9 % IV SOLN
INTRAVENOUS | Status: DC
  Administered 2014-11-04 – 2014-11-09 (×2): via INTRAVENOUS

## 2014-11-04 MED ORDER — LABETALOL HCL 5 MG/ML IV SOLN
20.0000 mg | INTRAVENOUS | Status: DC | PRN
  Administered 2014-11-04 – 2014-11-05 (×2): 20 mg via INTRAVENOUS
  Filled 2014-11-04 (×2): qty 4

## 2014-11-04 MED ORDER — FAT EMULSION 20 % IV EMUL
240.0000 mL | INTRAVENOUS | Status: AC
  Administered 2014-11-04: 240 mL via INTRAVENOUS
  Filled 2014-11-04: qty 250

## 2014-11-04 MED ORDER — POTASSIUM CHLORIDE 10 MEQ/100ML IV SOLN
10.0000 meq | INTRAVENOUS | Status: AC
  Administered 2014-11-04 (×4): 10 meq via INTRAVENOUS
  Filled 2014-11-04 (×4): qty 100

## 2014-11-04 MED ORDER — FUROSEMIDE 10 MG/ML IJ SOLN
40.0000 mg | Freq: Once | INTRAMUSCULAR | Status: AC
  Administered 2014-11-04: 40 mg via INTRAVENOUS
  Filled 2014-11-04: qty 4

## 2014-11-04 MED ORDER — FENTANYL CITRATE 0.05 MG/ML IJ SOLN
100.0000 ug | INTRAMUSCULAR | Status: DC | PRN
  Administered 2014-11-04 – 2014-11-07 (×27): 100 ug via INTRAVENOUS
  Filled 2014-11-04 (×26): qty 2

## 2014-11-04 MED ORDER — TRACE MINERALS CR-CU-F-FE-I-MN-MO-SE-ZN IV SOLN
INTRAVENOUS | Status: AC
  Administered 2014-11-04: 17:00:00 via INTRAVENOUS
  Filled 2014-11-04: qty 2880

## 2014-11-04 NOTE — Progress Notes (Signed)
PULMONARY / CRITICAL CARE MEDICINE   Name: William [name] MRN: [id number] DOB: 04/21/1951    ADMISSION DATE:  10/27/2014  CHIEF COMPLAINT:  Perforated sigmoid colon  BRIEF PATIENT DESCRIPTION:   64 y/o male former smoker presented with abd pain from perforated sigmoid colon taken to OR emergently.  Remained on vent post op.  Failed extubation 1/16, reintubated.    SIGNIFICANT EVENTS:  1/10  Admit, Partial colectomy  1/13  Off pressors 1/14  On TNA 1/16  Extubated >> reintubated in PM  STUDIES: 1/10  CT abd/pelvis >> sigmoid colon diverticulitis with perforation, umbilical hernia, b/l renal stones, 8 mm RLL nodule 1/11  ECHO >> EF 55-60%, nml systolic fxn, PA peak 32   1/17  CT Head >> no acute intracranial abnormality, ? Small vessel ischemic changes, L maxillary sinus disease  SUBJECTIVE:  RN reports mild agitation with WUA but following commands, improved.  PICC line replacement completed.     VITAL SIGNS: Temp:  [97.2 F (36.2 C)-100.3 F (37.9 C)] 97.2 F (36.2 C) (01/18 0821) Pulse Rate:  [74-88] 78 (01/18 0800) Resp:  [18-31] 20 (01/18 0800) BP: (118-148)/(63-87) 143/83 mmHg (01/18 0800) SpO2:  [89 %-96 %] 90 % (01/18 0800) FiO2 (%):  [40 %-70 %] 60 % (01/18 0941) Weight:  [280 lb 13.9 oz (127.4 kg)] 280 lb 13.9 oz (127.4 kg) (01/18 0400)   VENTILATOR SETTINGS: Vent Mode:  [-] PRVC FiO2 (%):  [40 %-70 %] 60 % Set Rate:  [18 bmp] 18 bmp Vt Set:  [610 mL] 610 mL PEEP:  [5 cmH20-10 cmH20] 5 cmH20 Pressure Support:  [10 cmH20] 10 cmH20 Plateau Pressure:  [15 cmH20-19 cmH20] 15 cmH20   INTAKE / OUTPUT: Intake/Output      01/17 0701 - 01/18 0700 01/18 0701 - 01/19 0700   P.O.  120   I.V. (mL/kg) 1171.5 (9.2) 38.1 (0.3)   IV Piggyback 750    TPN 2090.9 95   Total Intake(mL/kg) 4012.4 (31.5) 253.1 (2)   Urine (mL/kg/hr) 2000 (0.7) 150 (0.4)   Emesis/NG output 0 (0)    Drains 0 (0)    Stool 0 (0)    Total Output 2000 150   Net +2012.4 +103.1           PHYSICAL EXAMINATION: General: no distress Neuro: RASS -2, sedate after procedure HEENT: ETT, NG in place, pupils reactive Cardiovascular: regular Lungs: even/non-labored, no wheeze Abdomen: soft, wound vac in place, LLQ colostomy Musculoskeletal: 1+ edema Skin: no rashes  LABS:  CBC  Recent Labs Lab 11/01/14 0430 11/03/14 0443 11/04/14 0443  WBC 8.0 8.2 9.7  HGB 11.9* 12.9* 12.6*  HCT 36.8* 40.5 39.6  PLT 191 263 283   Coag's  Recent Labs Lab 10/29/14 0550  INR 1.23   BMET  Recent Labs Lab 11/02/14 0439 11/03/14 0443 11/04/14 0443  NA 130* 139 139  K 3.5 3.5 3.9  CL 93* 95* 100  CO2 31 33* 29  BUN 20 27* 25*  CREATININE 1.01 1.14 0.94  GLUCOSE 191* 182* 183*   Electrolytes  Recent Labs Lab 11/02/14 0439 11/03/14 0443 11/04/14 0443  CALCIUM 7.8* 8.5 8.4  MG 2.0 2.2 2.1  PHOS 4.0 3.4 3.1   Sepsis Markers  Recent Labs Lab 10/28/14 1800 10/29/14 0550 11/02/14 0439  LATICACIDVEN 1.9 1.5 1.1   ABG  Recent Labs Lab 11/03/14 0250  PHART 7.443  PCO2ART 45.2*  PO2ART 86.6   Liver Enzymes  Recent Labs Lab 10/29/14 0550 10/31/14 0300 11/04/14 0443  AST ALT ALKPHOS 36* 36* 62  BILITOT 0.9 0.7 0.9  ALBUMIN 2.8* 2.6* 2.7*   Cardiac Enzymes  Recent Labs Lab 10/28/14 1230 10/28/14 1800 10/29/14 0550  TROPONINI <0.03 <0.03 <0.03   Glucose  Recent Labs Lab 11/03/14 1252 11/03/14 1638 11/03/14 2011 11/04/14 0047 11/04/14 0433 11/04/14 0750  GLUCAP 167* 198* 163* 167* 178* 173*    Imaging Ct Head Wo Contrast  11/03/2014   CLINICAL DATA:  Agitated on ventilator.  Altered mental status.  EXAM: CT HEAD WITHOUT CONTRAST  TECHNIQUE: Contiguous axial images were obtained from the base of the skull through the vertex without contrast.  COMPARISON:  None  FINDINGS: There is mild motion artifact on this examination. No evidence for acute hemorrhage, mass lesion, midline shift, hydrocephalus or large infarct.  Patient has a nasogastric tube. There is mucosal thickening along the posterior left maxillary sinus. No acute bone abnormality. There is subtle low-density in the right frontal white matter, best seen on sequence 2, image 22.  IMPRESSION: No acute intracranial abnormality.  Subtle white matter disease is nonspecific. Findings could represent chronic small vessel ischemic changes.  Left maxillary sinus disease.   Electronically Signed   By: Richarda Overlie M.D.   On: 11/03/2014 12:01   Dg Chest Port 1 View  11/04/2014   CLINICAL DATA:  Acute respiratory failure with hypoxia  EXAM: PORTABLE CHEST - 1 VIEW  COMPARISON:  09/04/2015  FINDINGS: Endotracheal tube remains between the clavicular heads and the carina. The orogastric reaches the stomach. Interval shortening and coiling of the right upper extremity PICC, with the loop directed towards the right internal jugular vein. The tip is near the SVC origin.  Mild improvement in pulmonary venous congestion. There is persistent dense opacity at the left base, atelectasis versus pneumonia.  These results were called by telephone at the time of interpretation on 11/04/2014 at 6:08 am to [facility], who verbally acknowledged these results.  IMPRESSION: 1. Shortening of the right upper extremity PICC with tip at the SVC origin. A catheter loop is now herniating into the right IJ. 2. Persistent retrocardiac pneumonia or atelectasis. 3. Mild improvement in pulmonary venous congestion.   Electronically Signed   By: Tiburcio Pea M.D.   On: 11/04/2014 06:12   Dg Chest Port 1 View  11/03/2014   CLINICAL DATA:  Subsequent encounter for atelectasis. Hypertension. Partial colectomy.  EXAM: PORTABLE CHEST - 1 VIEW  COMPARISON:  1 day prior  FINDINGS: Support apparatus: Endotracheal tube 4.3 cm above carina. Nasogastric tube extends beyond the inferior aspect of the film. Right-sided PICC line terminating at high right atrium.  Cardiomediastinal silhouette: Midline trachea.  Cardiomegaly accentuated by AP portable technique.  Pleura:  Probable small left pleural effusion.  No pneumothorax.  Lungs: Mild interstitial edema is similar. Left greater than right bibasilar airspace disease is not significantly changed.  Other: None  IMPRESSION: No significant change since one day prior.  Mild congestive heart failure with left pleural effusion and bibasilar airspace disease. Most likely atelectasis. Left base infection cannot be excluded.   Electronically Signed   By: Jeronimo Greaves M.D.   On: 11/03/2014 07:14   Dg Chest Port 1 View  11/02/2014   CLINICAL DATA:  Endotracheal tube placement.  Initial encounter.  EXAM: PORTABLE CHEST - 1 VIEW  COMPARISON:  Chest radiograph performed 11/01/2014  FINDINGS: The patient's endotracheal tube is seen ending 4 cm above the carina. An enteric tube is  noted extending below the diaphragm. A right PICC is noted ending about the cavoatrial junction.  Patchy bilateral airspace opacities may reflect mild pulmonary edema or pneumonia. Vascular congestion is noted. An accessory azygos lobe is again seen. The left lung base is not well characterized. A small left pleural effusion is suspected. No pneumothorax is seen.  The cardiomediastinal silhouette is borderline normal in size. No acute osseous abnormalities are seen.  IMPRESSION: 1. Endotracheal tube seen ending 4 cm above the carina. 2. Patchy bilateral airspace opacities may reflect mild pulmonary edema or pneumonia. Vascular congestion noted. Small left pleural effusion suspected.   Electronically Signed   By: Roanna Raider M.D.   On: 11/02/2014 23:49    ASSESSMENT / PLAN:  PULMONARY ETT 1/10 >> 1/16, 1/16 >> A: Acute respiratory failure in setting of peritonitis. Hx of tobacco abuse with 8 mm RLL nodule on CT abd/pelvis. Hx of OSA. P:   Wean FiO2 / PEEP to keep SaO2 > 90% Pressure support wean as tolerated >> I am concerned he will need trach to come off vent due to issues with  agitation F/u CXR  CARDIOVASCULAR L femoral CVC 1/11 >> 1/18 RUE PICC 1/13 >> 1/18 (coiled) LUE PICC 1/18 >>  A: Septic shock 2nd to peritonitis >> resolved. Troponin elevation. Hx of HTN. P:   Monitor hemodynamics Hold outpt lisinopril D/c femoral line & RUE PICC   RENAL A: Lactic Acidosis  - resolved. Hypervolemia. Hypokalemia. P:   Monitor renal fx, urine outpt, electrolytes Lasix 40 mg IV x1 with KCL 1/18  GASTROINTESTINAL A: Perforated Sigmoid s/p Partial Colectomy (1/10). Nutrition. P:   Post-op care, nutrition per [facility] Protonix for SUP TNA  HEMATOLOGIC A: Anemia of critical illness. P: F/u CBC SQ heparin for DVT prevention  INFECTIOUS A: Septic shock 2nd to peritonitis from perforated sigmoid colon. Left Maxillary Sinus Disease  P:   Day 7 zosyn, micafungin > stop 1/18  Blood 1/11 >> neg Monitor fever curve / leukocytosis  ENDOCRINE A: Hyperglycemia. Hx of hypothyroidism.  P:   SSI  Continue synthroid IV  NEUROLOGIC A: Post-op pain control. Agitation - CT head 1/17 neg for acute abnormality.   P: RASS Goal: 0 Supportive care:  Minimize sedation as able, promote sleep wake cycle, mobilize etc  Precedex infusion  PRN ativan & fentanyl   Canary Brim, NP-C [facility] Pgr: [phone] or [phone]   Attending:  I have seen and examined the patient with nurse practitioner/resident and agree with the note above.   On my exam he is sedated on vent, scant rhonchi bilaterally Still requiring high O2, presumably from pulm edema and atelectasis No longer septic, stop Abx  Plan: Wean FiO2 Lasix Stop abx Daily SBt Wife updated at bedside  My CC time 40 minutes  Heber , MD [facility] Pager: [phone] Cell: [phone] If no response, call [phone]

## 2014-11-04 NOTE — Progress Notes (Signed)
eLink Physician-Brief Progress Note Patient Name: William [name] DOB: 03/14/1951 MRN: [id number]   Date of Service  11/04/2014  HPI/Events of Note  HTNive   eICU Interventions  Prn labetalol to be given     Intervention Category Major Interventions: Hypotension - evaluation and management  William [name] 11/04/2014, 10:20 PM

## 2014-11-04 NOTE — Progress Notes (Signed)
RN calling eMD  1. Left femoral vein - cvl stil present from 10/28/14  2. Rt UE PICC - is newly coiled on itself  REC - dc piccline = RN to call PICC team - remove left femoral cvl after new picc placed   Dr. Kalman ShanMurali Cecile Guevara, M.D., [facility].C.P Pulmonary and Critical Care Medicine Staff Physician [facility] Pager: [phone], If no answer or between  15:00h - 7:00h: call [phone]  11/04/2014 6:51 AM

## 2014-11-04 NOTE — Progress Notes (Signed)
PARENTERAL NUTRITION CONSULT NOTE   Pharmacy Consult for TPN Indication: Prolonged ileus  Allergies  Allergen Reactions  . Aloprim [Allopurinol] Other (See Comments)    Unknown reaction  . Simvastatin Other (See Comments)    Unknown reaction    Patient Measurements: Height: 5' 11.5" (181.6 cm) Weight: 280 lb 13.9 oz (127.4 kg) IBW/kg (Calculated) : 76.45 Adjusted Body Weight: 99kg Usual Weight: 114kg  Vital Signs: Temp: 97.2 F (36.2 C) (01/18 0821) Temp Source: Axillary (01/18 0000) BP: 133/77 mmHg (01/18 0600) Pulse Rate: 75 (01/18 0600) Intake/Output from previous day: 01/17 0701 - 01/18 0700 In: 4012.3 [I.V.:1171.4; IV Piggyback:750; TPN:2090.9] Out: 2000 [Urine:2000]  Labs:  Recent Labs  11/03/14 0443 11/04/14 0443  WBC 8.2 9.7  HGB 12.9* 12.6*  HCT 40.5 39.6  PLT 263 283     Recent Labs  11/02/14 0439 11/03/14 0443 11/04/14 0443  NA 130* 139 139  K 3.5 3.5 3.9  CL 93* 95* 100  CO2 31 33* 29  GLUCOSE 191* 182* 183*  BUN 20 27* 25*  CREATININE 1.01 1.14 0.94  CALCIUM 7.8* 8.5 8.4  MG 2.0 2.2 2.1  PHOS 4.0 3.4 3.1  PROT  --   --  6.3  ALBUMIN  --   --  2.7*  AST  --   --  23  ALT  --   --  24  ALKPHOS  --   --  62  BILITOT  --   --  0.9  TRIG  --   --  191*   Estimated Creatinine Clearance: 113.1 mL/min (by C-G formula based on Cr of 0.94).    Recent Labs  11/03/14 1638 11/03/14 2011 11/04/14 0047  GLUCAP 198* 163* 167*   Insulin Requirements in past 24 hours:  18 units of moderate SSI Novolog  Current Nutrition: Clinimix-E 5/15 at 95 mL/hr NPO  IVF: Esec LLCKVO  Central access: R UE PICC (placed 10/30/14) TPN start date: 1/13  ASSESSMENT                                                                                                          HPI: [id number] y/oM with HTN and nephrolithiasis [facility] with abdominal pain of 1 day duration secondary to a perforated sigmoid d/t diverticulitis. He was taken emergently to OR on  1/10 and is now s/p partial colectomy. Orders received to start TPN for anticipated prolonged ileus 1/13.  Significant events:  1/13: VDRF post-op remains on propofol (high needs) and pressor support with norepinephrine 1/14: Requiring max doses of propofol (4650mcg/kg/min = 38.361ml/hr) 1/15: Propofol at 30 mcg/kg/min (22.9 mL/hr).  Vital HP tube feeding started at fixed rate of 20 mL/hr 1/16: Propofol at 40 mcg/kg/min (30.5 mL/hr).  Extubated (TF and propofol DC), but required reintubation due to agitation.  1/18: Rt UE PICC is coiled - per MD ok to continue TPN infusion through this line for now.  Plan to replace PICC line.  Today, 11/04/2014:   Glucose:  CBGs slightly elevated but < 180 on SSI  At goal rate of Clinimix 5/15 120 ml/hr, glucose infusion rate will be 2.35 mg/kg/min  Electrolytes: WNL.  K improved after replacement.  Renal: SCr 0.94, BUN elevated.  I/O: +1.8L, gastric output decreased to 100 mL  LFTs: all below ULN on 1/18  TGs: 270 (1/14), 191 (1/18)  Prealbumin: 9.7 (1/14), pending (1/18)  NUTRITIONAL GOALS                                                                                              RD recs (1/17): 160 g protein/day, 1800-2000 kcal/d  Clinimix-E 5/15 at a goal rate of 120 ml/hr + 20% fat emulsion at 41ml/hr on Monday and Thursdays to provide: 144 g/day protein, avg of 2180 kcal/day. (Goal rate meets 90% of estimated protein needs and 100% of estimated kcal needs)  PLAN                                                                                                                         1. At 1800 today:  Increase Clinimix-E 5/15 to 120 mL/hr (goal rate)  20% Fat Emulsion on Monday/Thursday schedule only.  Resume today, Monday.  TPN to contain standard multivitamins and trace elements. 2. Continue CBGs and moderate SSI Novolog q4h 3. Full TPN lab panels on Mondays and Thursdays.  BMET tomorrow. 4. Follow clinical course daily.  Lynann Beaver PharmD, BCPS Pager [phone] 11/04/2014 9:40 AM

## 2014-11-04 NOTE — Progress Notes (Signed)
General Surgery Note  LOS: 8 days  POD -  7 Days Post-Op  Assessment/Plan: 1.  PARTIAL COLECTOMY Sigmoid, COLOSTOMY, EXPLORATORY LAPAROTOMY - 10/28/2014 - P. Toth  On Zosyn  WBC - 8,200 - 11/03/2014  Air and stool in ostomy bag.  If no extubation today, could start tube feeds.  2.  Respiratory failure  Reintubated Sat PM  Vent management per CCM. 3.  History of OSA 4.  Severe malnutrition  On TPN   5.  DVT prophylaxis - SQ Heparin 6.  Because of confusion/trouble remaining extubated - Dr. Craige CottaSood ordered head CT scan.  This was neg for any pathology.   Principal Problem:   Diverticulitis of colon with perforation s/p colectomy/colostomy 10/28/2014 Active Problems:   Shock circulatory   Acute respiratory failure with hypoxia   Acute post-operative pain   Solitary pulmonary nodule on lung CT   Sleep apnea  Subjective:  Intubated.   Wife, Waynetta SandyBeth, at bedside.   Objective:   Filed Vitals:   11/04/14 0821  BP:   Pulse:   Temp: 97.2 F (36.2 C)  Resp:      Intake/Output from previous day:  01/17 0701 - 01/18 0700 In: 4012.3 [I.V.:1171.4; IV Piggyback:750; TPN:2090.9] Out: 2000 [Urine:2000]  Intake/Output this shift:      Physical Exam:   General: Obese intubated older male.   HEENT: Normal. Pupils equal. .   Lungs: on vent   Abdomen: Large.     Wound: LLQ ostomy - air and stool in bag.  Wound with VAC.   Back: slight rash noted     Lab Results:     Recent Labs  11/03/14 0443 11/04/14 0443  WBC 8.2 9.7  HGB 12.9* 12.6*  HCT 40.5 39.6  PLT 263 283    BMET    Recent Labs  11/03/14 0443 11/04/14 0443  NA 139 139  K 3.5 3.9  CL 95* 100  CO2 33* 29  GLUCOSE 182* 183*  BUN 27* 25*  CREATININE 1.14 0.94  CALCIUM 8.5 8.4    PT/INR  No results for input(s): LABPROT, INR in the last 72 hours.  ABG    Recent Labs  11/03/14 0250  PHART 7.443  HCO3 30.2*     Studies/Results:  Ct Head Wo Contrast  11/03/2014   CLINICAL DATA:  Agitated on  ventilator.  Altered mental status.  EXAM: CT HEAD WITHOUT CONTRAST  TECHNIQUE: Contiguous axial images were obtained from the base of the skull through the vertex without contrast.  COMPARISON:  None  FINDINGS: There is mild motion artifact on this examination. No evidence for acute hemorrhage, mass lesion, midline shift, hydrocephalus or large infarct. Patient has a nasogastric tube. There is mucosal thickening along the posterior left maxillary sinus. No acute bone abnormality. There is subtle low-density in the right frontal white matter, best seen on sequence 2, image 22.  IMPRESSION: No acute intracranial abnormality.  Subtle white matter disease is nonspecific. Findings could represent chronic small vessel ischemic changes.  Left maxillary sinus disease.   Electronically Signed   By: Richarda OverlieAdam  Henn M.D.   On: 11/03/2014 12:01   Dg Chest Port 1 View  11/04/2014   CLINICAL DATA:  Acute respiratory failure with hypoxia  EXAM: PORTABLE CHEST - 1 VIEW  COMPARISON:  09/04/2015  FINDINGS: Endotracheal tube remains between the clavicular heads and the carina. The orogastric reaches the stomach. Interval shortening and coiling of the right upper extremity PICC, with the loop directed towards the right internal  jugular vein. The tip is near the SVC origin.  Mild improvement in pulmonary venous congestion. There is persistent dense opacity at the left base, atelectasis versus pneumonia.  These results were called by telephone at the time of interpretation on 11/04/2014 at 6:08 am to [facility], who verbally acknowledged these results.  IMPRESSION: 1. Shortening of the right upper extremity PICC with tip at the SVC origin. A catheter loop is now herniating into the right IJ. 2. Persistent retrocardiac pneumonia or atelectasis. 3. Mild improvement in pulmonary venous congestion.   Electronically Signed   By: Tiburcio Pea M.D.   On: 11/04/2014 06:12   Dg Chest Port 1 View  11/03/2014   CLINICAL DATA:  Subsequent  encounter for atelectasis. Hypertension. Partial colectomy.  EXAM: PORTABLE CHEST - 1 VIEW  COMPARISON:  1 day prior  FINDINGS: Support apparatus: Endotracheal tube 4.3 cm above carina. Nasogastric tube extends beyond the inferior aspect of the film. Right-sided PICC line terminating at high right atrium.  Cardiomediastinal silhouette: Midline trachea. Cardiomegaly accentuated by AP portable technique.  Pleura:  Probable small left pleural effusion.  No pneumothorax.  Lungs: Mild interstitial edema is similar. Left greater than right bibasilar airspace disease is not significantly changed.  Other: None  IMPRESSION: No significant change since one day prior.  Mild congestive heart failure with left pleural effusion and bibasilar airspace disease. Most likely atelectasis. Left base infection cannot be excluded.   Electronically Signed   By: Jeronimo Greaves M.D.   On: 11/03/2014 07:14   Dg Chest Port 1 View  11/02/2014   CLINICAL DATA:  Endotracheal tube placement.  Initial encounter.  EXAM: PORTABLE CHEST - 1 VIEW  COMPARISON:  Chest radiograph performed 11/01/2014  FINDINGS: The patient's endotracheal tube is seen ending 4 cm above the carina. An enteric tube is noted extending below the diaphragm. A right PICC is noted ending about the cavoatrial junction.  Patchy bilateral airspace opacities may reflect mild pulmonary edema or pneumonia. Vascular congestion is noted. An accessory azygos lobe is again seen. The left lung base is not well characterized. A small left pleural effusion is suspected. No pneumothorax is seen.  The cardiomediastinal silhouette is borderline normal in size. No acute osseous abnormalities are seen.  IMPRESSION: 1. Endotracheal tube seen ending 4 cm above the carina. 2. Patchy bilateral airspace opacities may reflect mild pulmonary edema or pneumonia. Vascular congestion noted. Small left pleural effusion suspected.   Electronically Signed   By: Roanna Raider M.D.   On: 11/02/2014 23:49      Anti-infectives:   Anti-infectives    Start     Dose/Rate Route Frequency Ordered Stop   11/01/14 2000  vancomycin (VANCOCIN) 1,250 mg in sodium chloride 0.9 % 250 mL IVPB  Status:  Discontinued     1,250 mg166.7 mL/hr over 90 Minutes Intravenous Every 12 hours 11/01/14 0702 11/02/14 0823   10/30/14 2200  vancomycin (VANCOCIN) IVPB 1000 mg/200 mL premix  Status:  Discontinued     1,000 mg200 mL/hr over 60 Minutes Intravenous Every 8 hours 10/30/14 1746 11/01/14 0702   10/28/14 1800  vancomycin (VANCOCIN) 1,250 mg in sodium chloride 0.9 % 250 mL IVPB  Status:  Discontinued     1,250 mg166.7 mL/hr over 90 Minutes Intravenous Every 12 hours 10/28/14 0358 10/28/14 0824   10/28/14 1400  vancomycin (VANCOCIN) IVPB 1000 mg/200 mL premix  Status:  Discontinued     1,000 mg200 mL/hr over 60 Minutes Intravenous Every 12 hours 10/28/14 0824 10/30/14  1746   10/28/14 1000  micafungin (MYCAMINE) 100 mg in sodium chloride 0.9 % 100 mL IVPB  Status:  Discontinued     100 mg100 mL/hr over 1 Hours Intravenous Daily 10/28/14 0342 10/28/14 0344   10/28/14 0800  metroNIDAZOLE (FLAGYL) IVPB 500 mg  Status:  Discontinued     500 mg100 mL/hr over 60 Minutes Intravenous Every 8 hours 10/28/14 0335 10/28/14 0343   10/28/14 0600  piperacillin-tazobactam (ZOSYN) IVPB 3.375 g     3.375 g12.5 mL/hr over 240 Minutes Intravenous 3 times per day 10/28/14 0339     10/28/14 0400  micafungin (MYCAMINE) 100 mg in sodium chloride 0.9 % 100 mL IVPB     100 mg100 mL/hr over 1 Hours Intravenous Daily 10/28/14 0342     10/28/14 0400  vancomycin (VANCOCIN) 1,750 mg in sodium chloride 0.9 % 500 mL IVPB     1,750 mg250 mL/hr over 120 Minutes Intravenous  Once 10/28/14 0353 10/28/14 0749   10/28/14 0030  [MAR Hold]  metroNIDAZOLE (FLAGYL) IVPB 500 mg     (MAR Hold since 10/28/14 0043)   500 mg100 mL/hr over 60 Minutes Intravenous  Once 10/28/14 0026 10/28/14 0200   10/27/14 2030  piperacillin-tazobactam (ZOSYN) IVPB 3.375 g      3.375 g100 mL/hr over 30 Minutes Intravenous  Once 10/27/14 2026 10/27/14 2110      Vanita Panda, MD  Colorectal and General Surgery [facility]   11/04/2014

## 2014-11-04 NOTE — Consult Note (Signed)
WOC ostomy follow up Stoma type/location: LLQ Colostomy Stomal assessment/size: 1 and 1/8 inches x 2 inches oval.  Os at center. Dusky stoma with edema resolving, viablility noted in observing downward into os. Peristomal assessment: Intact, clear Treatment options for stomal/peristomal skin: Skin barrier ring Output small amount of brown liquid in pouch.  Staff report flatus. Ostomy pouching: 2pc. Pouching system, 2 and 1/4 inch with skin barrier ring. Education provided: Wife provided with demonstration of pouch change today and an educational booklet. Patient was re intubated over the weekend and is still critically ill. Enrolled patient in [facility] [employer] Discharge program: No  WOC wound follow up Wound type: Midline surgical Measurement:16cm x 3cm x 4cm.  A "false bottom" is noted today at dressing change, also a small amount of periwound erythema from NPWT sponge lying atop intact periwound skin.  Sterile applicator used to open base of wound bed without difficulty. Wound bed:As described above.  Red, moist, friable.  Bleeding in a small amount noted when I opened the wound base to reveal the true depth. Drainage (amount, consistency, odor) Serosanguinous. Periwound:Intact with above mentioned erythema from NPWT sponge resting atop intact skin.  Care is taken today to confine the black foam to the wound bed. Dressing procedure/placement/frequency:NPWT to continue at 125mmHg negative continuous pressure. Today, 1 piece of black granufoam used to fill dead space.  Patient may require periwound shaving prior to subsequent dressing changes if hair growth continues. Dressing is labeled.  Next change is due on Wednesday, 11/06/14.  I will be out of the facility the rest of the week. My partenrs will be following this patient in my absence.  WOC nursing team will follow, and will remain available to this patient, the nursing, surgical and medical teams.   Thanks, Ladona MowLaurie Kendalynn Wideman, MSN, RN,  GNP, [place], CWON-AP [phone])

## 2014-11-04 NOTE — Progress Notes (Signed)
ANTIBIOTIC CONSULT NOTE - FOLLOW UP  Pharmacy Consult for Zosyn Indication: Sepsis secondary to intra-abdominal abscess  Allergies  Allergen Reactions  . Aloprim [Allopurinol] Other (See Comments)    Unknown reaction  . Simvastatin Other (See Comments)    Unknown reaction    Patient Measurements: Height: 5' 11.5" (181.6 cm) Weight: 280 lb 13.9 oz (127.4 kg) IBW/kg (Calculated) : 76.45  Vital Signs: Temp: 97.2 F (36.2 C) (01/18 0821) Temp Source: Axillary (01/18 0000) BP: 143/83 mmHg (01/18 0800) Pulse Rate: 78 (01/18 0800) Intake/Output from previous day: 01/17 0701 - 01/18 0700 In: 4012.4 [I.V.:1171.5; IV Piggyback:750; TPN:2090.9] Out: 2000 [Urine:2000]  Labs:  Recent Labs  11/02/14 0439 11/03/14 0443 11/04/14 0443  WBC  --  8.2 9.7  HGB  --  12.9* 12.6*  PLT  --  263 283  CREATININE 1.01 1.14 0.94   Estimated Creatinine Clearance: 113.1 mL/min (by C-G formula based on Cr of 0.94).  Assessment: [age] y/oM with HTN and nephrolithiasis who [facility] with abdominal pain of 1 day duration secondary to a perforated sigmoid. He was taken emergently to OR on 1/10 and is now s/p partial colectomy. Pharmacy consulted to assist with dosing of Vancomycin and Zosyn for sepsis secondary to intraabdominal abscess. Patient is also on Micafungin per MD for same.  1/11 >> Zosyn >> 1/11 >> Vancomycin >> 1/16 1/11 >> Micafungin >>  Today, 11/04/2014:  Tmax: 100.3  WBCs: remaining WNL since 1/13  Renal: SCr 0.94, CrCl > 100 CG (84 N)  Cultures remain NGTD   Goal of Therapy:  Appropriate abx dosing, eradication of infection.   Plan:   Continue Zosyn 3.375g IV Q8H infused over 4hrs.  Pharmacy to f/u renal function, cultures, and clinical condition.  Follow up planned duration of therapy.  Lynann Beaverhristine Alesa Echevarria PharmD, BCPS Pager [phone] 11/04/2014 11:23 AM

## 2014-11-05 ENCOUNTER — Inpatient Hospital Stay (HOSPITAL_COMMUNITY): Payer: 59

## 2014-11-05 LAB — CBC
HCT: 38 % — ABNORMAL LOW (ref 39.0–52.0)
HEMOGLOBIN: 12.5 g/dL — AB (ref 13.0–17.0)
MCH: 32.1 pg (ref 26.0–34.0)
MCHC: 32.9 g/dL (ref 30.0–36.0)
MCV: 97.4 fL (ref 78.0–100.0)
Platelets: 248 10*3/uL (ref 150–400)
RBC: 3.9 MIL/uL — AB (ref 4.22–5.81)
RDW: 14.1 % (ref 11.5–15.5)
WBC: 14 10*3/uL — ABNORMAL HIGH (ref 4.0–10.5)

## 2014-11-05 LAB — BRAIN NATRIURETIC PEPTIDE: B NATRIURETIC PEPTIDE 5: 106.1 pg/mL — AB (ref 0.0–100.0)

## 2014-11-05 LAB — GLUCOSE, CAPILLARY
GLUCOSE-CAPILLARY: 220 mg/dL — AB (ref 70–99)
Glucose-Capillary: 187 mg/dL — ABNORMAL HIGH (ref 70–99)
Glucose-Capillary: 189 mg/dL — ABNORMAL HIGH (ref 70–99)
Glucose-Capillary: 182 mg/dL — ABNORMAL HIGH (ref 70–99)
Glucose-Capillary: 246 mg/dL — ABNORMAL HIGH (ref 70–99)
Glucose-Capillary: 233 mg/dL — ABNORMAL HIGH (ref 70–99)

## 2014-11-05 LAB — BASIC METABOLIC PANEL
Anion gap: 11 (ref 5–15)
BUN: 23 mg/dL (ref 6–23)
CHLORIDE: 102 meq/L (ref 96–112)
CO2: 25 mmol/L (ref 19–32)
Calcium: 8.4 mg/dL (ref 8.4–10.5)
Creatinine, Ser: 1.18 mg/dL (ref 0.50–1.35)
GFR, EST AFRICAN AMERICAN: 75 mL/min — AB (ref >90)
GFR, EST NON AFRICAN AMERICAN: 65 mL/min — AB (ref >90)
Glucose, Bld: 204 mg/dL — ABNORMAL HIGH (ref 70–99)
Potassium: 3.9 mmol/L (ref 3.5–5.1)
Sodium: 138 mmol/L (ref 135–145)

## 2014-11-05 LAB — CK TOTAL AND CKMB (NOT AT ARMC)
CK, MB: 1.3 ng/mL (ref 0.3–4.0)
RELATIVE INDEX: 1.1 (ref 0.0–2.5)
Total CK: 122 U/L (ref 7–232)

## 2014-11-05 LAB — LACTIC ACID, PLASMA: Lactic Acid, Venous: 1.3 mmol/L (ref 0.5–2.2)

## 2014-11-05 MED ORDER — M.V.I. ADULT IV INJ
INTRAVENOUS | Status: AC
  Administered 2014-11-05: 17:00:00 via INTRAVENOUS
  Filled 2014-11-05: qty 2880

## 2014-11-05 MED ORDER — HYDRALAZINE HCL 20 MG/ML IJ SOLN
10.0000 mg | INTRAMUSCULAR | Status: DC | PRN
  Administered 2014-11-05: 20 mg via INTRAVENOUS
  Filled 2014-11-05: qty 1

## 2014-11-05 MED ORDER — PROPOFOL 10 MG/ML IV EMUL
INTRAVENOUS | Status: AC
  Administered 2014-11-05: 35 ug/kg/min via INTRAVENOUS
  Filled 2014-11-05: qty 100

## 2014-11-05 MED ORDER — FUROSEMIDE 10 MG/ML IJ SOLN
INTRAMUSCULAR | Status: AC
  Administered 2014-11-05: 40 mg via INTRAVENOUS
  Filled 2014-11-05: qty 4

## 2014-11-05 MED ORDER — VITAL HIGH PROTEIN PO LIQD
1000.0000 mL | ORAL | Status: DC
  Administered 2014-11-05 (×3)
  Administered 2014-11-05: 1000 mL
  Filled 2014-11-05 (×2): qty 1000

## 2014-11-05 MED ORDER — HYDRALAZINE HCL 20 MG/ML IJ SOLN
INTRAMUSCULAR | Status: AC
  Administered 2014-11-05: 20 mg via INTRAVENOUS
  Filled 2014-11-05: qty 1

## 2014-11-05 MED ORDER — PROPOFOL 10 MG/ML IV EMUL
5.0000 ug/kg/min | INTRAVENOUS | Status: DC
Start: 2014-11-05 — End: 2014-11-08
  Administered 2014-11-05 (×2): 35 ug/kg/min via INTRAVENOUS
  Administered 2014-11-05: 50 ug/kg/min via INTRAVENOUS
  Administered 2014-11-05 (×4): 60 ug/kg/min via INTRAVENOUS
  Administered 2014-11-05: 50 ug/kg/min via INTRAVENOUS
  Administered 2014-11-06 (×2): 60 ug/kg/min via INTRAVENOUS
  Administered 2014-11-06: 30 ug/kg/min via INTRAVENOUS
  Administered 2014-11-06: 60 ug/kg/min via INTRAVENOUS
  Administered 2014-11-06 (×2): 40 ug/kg/min via INTRAVENOUS
  Administered 2014-11-06 (×2): 60 ug/kg/min via INTRAVENOUS
  Administered 2014-11-06: 40 ug/kg/min via INTRAVENOUS
  Administered 2014-11-07 (×2): 60 ug/kg/min via INTRAVENOUS
  Administered 2014-11-07: 50 ug/kg/min via INTRAVENOUS
  Administered 2014-11-07: 60 ug/kg/min via INTRAVENOUS
  Administered 2014-11-07: 40 ug/kg/min via INTRAVENOUS
  Administered 2014-11-07 (×2): 60 ug/kg/min via INTRAVENOUS
  Administered 2014-11-07 (×2): 50 ug/kg/min via INTRAVENOUS
  Administered 2014-11-07: 60 ug/kg/min via INTRAVENOUS
  Administered 2014-11-08 (×2): 40 ug/kg/min via INTRAVENOUS
  Filled 2014-11-05 (×12): qty 100
  Filled 2014-11-05: qty 200
  Filled 2014-11-05 (×3): qty 100
  Filled 2014-11-05: qty 200
  Filled 2014-11-05 (×9): qty 100

## 2014-11-05 MED ORDER — FUROSEMIDE 10 MG/ML IJ SOLN
40.0000 mg | Freq: Three times a day (TID) | INTRAMUSCULAR | Status: DC
  Administered 2014-11-05 – 2014-11-07 (×7): 40 mg via INTRAVENOUS
  Filled 2014-11-05 (×7): qty 4

## 2014-11-05 NOTE — Progress Notes (Signed)
8 Days Post-Op  Subjective: No real change. On vent, sedated.  Flatus from the ostomy, but not anything else.    Objective: Vital signs in last 24 hours: Temp:  [98.4 F (36.9 C)-99.8 F (37.7 C)] 99.8 F (37.7 C) (01/19 0400) Pulse Rate:  [76-86] 86 (01/19 0324) Resp:  [15-28] 24 (01/19 0600) BP: (89-202)/(34-101) 140/79 mmHg (01/19 0600) SpO2:  [89 %-100 %] 100 % (01/19 0600) FiO2 (%):  [50 %-60 %] 60 % (01/19 0400) Weight:  [127.5 kg (281 lb 1.4 oz)] 127.5 kg (281 lb 1.4 oz) (01/19 0600) Last BM Date:  (COLOSTOMY) Nothing recorded from the ostomy, drain: 250 ml 120 PO recorded Afebrile, TM 99.6 WBC is up some, glucose 204 Intake/Output from previous day: 01/18 0701 - 01/19 0700 In: 3677.3 [P.O.:120; I.V.:832.3; NG/GT:90; IV Piggyback:20; TPN:2545] Out: 4430 [Urine:4180; Drains:250] Intake/Output this shift:    General appearance: sedated on vent coming out the bottom, he doesn't have much room to move inthe bed. GI: large abd, wound vac in place, few Bs, nothing from the vac aside from gas.  Lab Results:   Recent Labs  11/04/14 0443 11/05/14 0421  WBC 9.7 14.0*  HGB 12.6* 12.5*  HCT 39.6 38.0*  PLT 283 248    BMET  Recent Labs  11/04/14 0443 11/05/14 0421  NA 139 138  K 3.9 3.9  CL 100 102  CO2 29 25  GLUCOSE 183* 204*  BUN 25* 23  CREATININE 0.94 1.18  CALCIUM 8.4 8.4   PT/INR No results for input(s): LABPROT, INR in the last 72 hours.   Recent Labs Lab 10/31/14 0300 11/04/14 0443  AST 18 23  ALT 16 24  ALKPHOS 36* 62  BILITOT 0.7 0.9  PROT 5.6* 6.3  ALBUMIN 2.6* 2.7*     Lipase     Component Value Date/Time   LIPASE 23 10/27/2014 1810     Studies/Results: Ct Head Wo Contrast  11/03/2014   CLINICAL DATA:  Agitated on ventilator.  Altered mental status.  EXAM: CT HEAD WITHOUT CONTRAST  TECHNIQUE: Contiguous axial images were obtained from the base of the skull through the vertex without contrast.  COMPARISON:  None  FINDINGS: There  is mild motion artifact on this examination. No evidence for acute hemorrhage, mass lesion, midline shift, hydrocephalus or large infarct. Patient has a nasogastric tube. There is mucosal thickening along the posterior left maxillary sinus. No acute bone abnormality. There is subtle low-density in the right frontal white matter, best seen on sequence 2, image 22.  IMPRESSION: No acute intracranial abnormality.  Subtle white matter disease is nonspecific. Findings could represent chronic small vessel ischemic changes.  Left maxillary sinus disease.   Electronically Signed   By: Richarda Overlie M.D.   On: 11/03/2014 12:01   Dg Chest Port 1 View  11/05/2014   CLINICAL DATA:  Respiratory failure with hypoxia  EXAM: PORTABLE CHEST - 1 VIEW  COMPARISON:  11/04/2014  FINDINGS: Endotracheal tube remains in good position. NG tube enters the stomach. Right arm PICC has been removed.  Interval placement of left arm PICC with the tip in the SVC in good position.  Progression of right lower lobe atelectasis. Left lower lobe atelectasis shows mild interval improvement. Negative for heart failure.  IMPRESSION: Left arm PICC tip in the lower SVC in good position.  Progression of right lower lobe atelectasis. Mild improvement in left lower lobe atelectasis.   Electronically Signed   By: Marlan Palau M.D.   On: 11/05/2014  07:09   Dg Chest Port 1 View  11/04/2014   CLINICAL DATA:  Acute respiratory failure with hypoxia  EXAM: PORTABLE CHEST - 1 VIEW  COMPARISON:  09/04/2015  FINDINGS: Endotracheal tube remains between the clavicular heads and the carina. The orogastric reaches the stomach. Interval shortening and coiling of the right upper extremity PICC, with the loop directed towards the right internal jugular vein. The tip is near the SVC origin.  Mild improvement in pulmonary venous congestion. There is persistent dense opacity at the left base, atelectasis versus pneumonia.  These results were called by telephone at the time  of interpretation on 11/04/2014 at 6:08 am to [facility] Breann, who verbally acknowledged these results.  IMPRESSION: 1. Shortening of the right upper extremity PICC with tip at the SVC origin. A catheter loop is now herniating into the right IJ. 2. Persistent retrocardiac pneumonia or atelectasis. 3. Mild improvement in pulmonary venous congestion.   Electronically Signed   By: Tiburcio PeaJonathan  Watts M.D.   On: 11/04/2014 06:12    Medications: . antiseptic oral rinse  7 mL Mouth Rinse QID  . chlorhexidine  15 mL Mouth Rinse BID  . furosemide  40 mg Intravenous Q8H  . heparin  5,000 Units Subcutaneous 3 times per day  . insulin aspart  0-15 Units Subcutaneous 6 times per day  . levothyroxine  75 mcg Intravenous Daily  . lip balm  1 application Topical BID  . pantoprazole (PROTONIX) IV  40 mg Intravenous Q24H  . sodium chloride  10-40 mL Intracatheter Q12H    Assessment/Plan 1. Sigmoid diverticulitis, Perforated Bowel EXPLORATORY LAPAROTOMY, PARTIAL COLECTOMY Sigmoid, COLOSTOMY,  Repair umbilical hernia, 10/28/2014, Chevis PrettyPaul Toth III, MD.  2. Sepsis with hypotension 3.Ventilator dependant respiratory failure/EXTUBATED/RE intubated 1/16?16  4. Hx of hypertension, now requiring pressors 5. Hypothyroid TSH 16 6. Hx of tobacco use 30 years (quit 9 years ago) 7. Body mass index is 38.51 kg  8. Hx of nephrolithiasis 9. Heparin for DVT prophylaxis 10.  Malnutrition/TNA, going to see how he does with some TF.  Prealbumin 15.9   Plan:  Use ng and try some trophic feeds.  Will ask dietician to start at 10 ml per hour.  Appreciate Pulmonary, get him a bariatric bed..    LOS: 9 days    Wileman,Lopez Dentinger 11/05/2014

## 2014-11-05 NOTE — Progress Notes (Signed)
   BP continues to be high sbp 190, map 110, HR 87 despite prn labetalol  Plan Scheduled lasix (suspect diast dysfn) Check bnp Change labetalol to prn hydralazine; goal < 170 sbp   Dr. Kalman ShanMurali Zanylah Hardie, M.D., [facility]C.P Pulmonary and Critical Care Medicine Staff Physician [facility] [facility] Pager: [phone], If no answer or between  15:00h - 7:00h: call [phone]  11/05/2014 1:44 AM

## 2014-11-05 NOTE — Progress Notes (Signed)
Agitated on precedex gtt + fent prn + ativan prn   Plan Start diprivan (most recent CK ok); check Ck/lactate RASS goal -2 Once diprivan on board, RN to wean precedex to off for same RASS goal  Dr. See Beharry, M.D., [facility] PaF.C.C.P Pulmonary and Critical Care Medicine Staff Physician [facility] [place] [facility] Pager: [phone], If no answer or between  15:00h - 7:00h: call [phone]  11/05/2014 3:15 AM

## 2014-11-05 NOTE — Progress Notes (Signed)
NUTRITION FOLLOW UP/TF Consult  Intervention:   -TPN per pharmacy -Diet advancement per MD -Initiate trickle feeds of Vital HP @ 10 ml/hr for next 24 hours per surgery  If TF warranted (will be adjusted if pt still on Propofol): -Recommend Initiate Vital HP@ 20 ml/hr via NGT and increase by 10 ml every 4 hours to goal rate of 75 ml/hr.   Tube feeding regimen provides 1800 kcal (73% of needs), 158 grams of protein, and 1505 ml of H2O.   -RD to continue to monitor  Nutrition Dx:   Inadequate oral intake related to inability to eat as evidenced by NPO; ongoing  Goal:   Parenteral nutrition to provide 60-70% of estimated calorie needs (22-25 kcals/kg ideal body weight) and 100% of estimated protein needs, based on ASPEN guidelines for hypocaloric, high protein feeding in critically ill obese individuals  Monitor:   TPN tolerance, TF initiation, GI profile, weight trend, vent status/settings, labs  Assessment:   1/11: Pt s/p partial colectomy 10/27/14.  - Pt with bleeding from surgical sites.  - Spoke with pt's wife who reported a recent increase in pt's weight (30 lbs in 2-3 months) due to "stress eating." She says that pt will often eat lunch that she packs for him at work as well as food provided to him. She reports an increase in the consumption of starchy foods such as potato chips and breads.   1/13: -Pt remains intubated and sedated, gradually wean of vent and pressors d/t ARDs and obesity/distension per MD note -RD consulted for initiation of TPN  -Wt increased 13 lbs since 1/11; likely d/t 5.7L fluid resuscitation; currently +800 ml output -WOC following for ostomy care -Monitor triglycerides with propofol, may need to administer TPN lipids 2-3x/week vs every day -CBG elevated > 150 mg/dL, monitor with initiation of TPN -K elevated at 6.1, now WNL -Low Phos, being repleted with [facility]  1/17 -RD consulted for TF initiation and management -1/16 Pt was extubated, but failed.  Pt re-intubated later on 1/16. -Pt was started on Vital HP at 20 ml/hr but that was d/c along with propofol infusion. -Spoke with student RN who is to have RN contact RD regarding plans for TF -Pt is currently receiving Clinimix-E 5/15 at 80 mL/hr (~66% of goal rate). Per pharmacy, TPN will be increased to 95 ml/hr at 1800 today. -Wt is back down to admission weight -CBGs still >150 mg/dL -BUN elevated, Mg/Phos WNL  1/19 -RD consulted to begin trickle feeds of Vital HP @ 10 ml/hr over 24 hour period. -Pt continues to receive TPN per pharmacy: Clinimix-E 5/15 120 ml/hr + 20% fat emulsion at 43ml/hr on Monday and Thursdays to provide: 144 g/day protein, avg of 2180 kcal/day. (Goal rate meets 90% of estimated protein needs and 100% of estimated kcal needs) -Pt is now sedated using Propofol contributing 404 fat kcal. -CBGs: 187-220 mg/dL  Patient is currently intubated on ventilator support MV: 15 L/min Temp (24hrs), Avg:99.4 F (37.4 C), Min:98.4 F (36.9 C), Max:99.8 F (37.7 C)  Propofol: 15.3 ml/hr -provides 404 fat kcal  Height: Ht Readings from Last 1 Encounters:  10/28/14 5' 11.5" (1.816 m)    Weight Status:   Wt Readings from Last 1 Encounters:  11/05/14 281 lb 1.4 oz (127.5 kg)  10/30/14 293 lb 10/27/14 280 lb  Re-estimated needs(1/19):  Kcal: 2582 (1800-2000 kcal = 22-25 kcal/kg IBW) Protein:~160 gram protein Fluid: per MD  Skin:  Surgical incision on abd, +1 generalized edema, +3 perineal edema  Diet Order: Diet NPO time specified TPN (CLINIMIX-E) Adult   Intake/Output Summary (Last 24 hours) at 11/05/14 1003 Last data filed at 11/05/14 0800  Gross per 24 hour  Intake 3491.58 ml  Output   4105 ml  Net -613.42 ml    Last BM: 1/11 -colostomy   Labs:   Recent Labs Lab 11/02/14 0439 11/03/14 0443 11/04/14 0443 11/05/14 0421  NA 130* 139 139 138  K 3.5 3.5 3.9 3.9  CL 93* 95* 100 102  CO2 31 33* 29 25  BUN 20 27* 25* 23  CREATININE 1.01 1.14  0.94 1.18  CALCIUM 7.8* 8.5 8.4 8.4  MG 2.0 2.2 2.1  --   PHOS 4.0 3.4 3.1  --   GLUCOSE 191* 182* 183* 204*    CBG (last 3)   Recent Labs  11/05/14 0018 11/05/14 0432 11/05/14 0809  GLUCAP 187* 189* 220*    Scheduled Meds: . antiseptic oral rinse  7 mL Mouth Rinse QID  . chlorhexidine  15 mL Mouth Rinse BID  . furosemide  40 mg Intravenous Q8H  . heparin  5,000 Units Subcutaneous 3 times per day  . insulin aspart  0-15 Units Subcutaneous 6 times per day  . levothyroxine  75 mcg Intravenous Daily  . lip balm  1 application Topical BID  . pantoprazole (PROTONIX) IV  40 mg Intravenous Q24H  . sodium chloride  10-40 mL Intracatheter Q12H    Continuous Infusions: . sodium chloride 10 mL/hr at 11/04/14 2305  . Marland Kitchen.TPN (CLINIMIX-E) Adult 120 mL/hr at 11/04/14 1704   And  . fat emulsion 240 mL (11/04/14 1703)  . propofol 20 mcg/kg/min (11/05/14 0843)    Tilda FrancoLindsey Danity Schmelzer, MS, RD, LDN Pager: [phone] After Hours Pager: [phone]

## 2014-11-05 NOTE — Progress Notes (Signed)
Placed in [employer] [place] [facility] Bariatric [facility].

## 2014-11-05 NOTE — Progress Notes (Signed)
Pt receiving bath and bed linen change by RN and NT.  Pt was laying flat and slightly agitated during this ventilator check.

## 2014-11-05 NOTE — Progress Notes (Signed)
PULMONARY / CRITICAL CARE MEDICINE   Name: William [name] MRN: [id number] DOB: 1951/02/19    ADMISSION DATE:  10/27/2014  CHIEF COMPLAINT:  Perforated sigmoid colon  BRIEF PATIENT DESCRIPTION:   64 y/o male former smoker presented with abd pain from perforated sigmoid colon taken to OR emergently.  Remained on vent post op.  Failed extubation 1/16, reintubated.    SIGNIFICANT EVENTS:  1/10  Admit, Partial colectomy  1/13  Off pressors 1/14  On TNA 1/16  Extubated >> reintubated in PM 1/19  Agitation, hypertension overnight, sedation changed to propofol. 60% / 8  STUDIES: 1/10  CT abd/pelvis >> sigmoid colon diverticulitis with perforation, umbilical hernia, b/l renal stones, 8 mm RLL nodule 1/11  ECHO >> EF 55-60%, nml systolic fxn, PA peak 32   1/17  CT Head >> no acute intracranial abnormality, ? Small vessel ischemic changes, L maxillary sinus disease  SUBJECTIVE:  RN reports agitation overnight with hypertension.  Sedation changed to propofol.   Net 1L neg after diuresis 1/18. O2 to 50%, PEEP 8   VITAL SIGNS: Temp:  [98.4 F (36.9 C)-99.8 F (37.7 C)] 99.2 F (37.3 C) (01/19 0800) Pulse Rate:  [76-86] 86 (01/19 0324) Resp:  [15-28] 20 (01/19 0844) BP: (89-202)/(34-101) 101/47 mmHg (01/19 0800) SpO2:  [89 %-100 %] 94 % (01/19 0844) FiO2 (%):  [50 %-60 %] 50 % (01/19 0841) Weight:  [281 lb 1.4 oz (127.5 kg)] 281 lb 1.4 oz (127.5 kg) (01/19 0600)   VENTILATOR SETTINGS: Vent Mode:  [-] PRVC FiO2 (%):  [50 %-60 %] 50 % Set Rate:  [18 bmp] 18 bmp Vt Set:  [610 mL] 610 mL PEEP:  [5 cmH20-8 cmH20] 8 cmH20 Plateau Pressure:  [15 cmH20-22 cmH20] 18 cmH20   INTAKE / OUTPUT: Intake/Output      01/18 0701 - 01/19 0700 01/19 0701 - 01/20 0700   P.O. 120    I.V. (mL/kg) 842.3 (6.6) 40.6 (0.3)   Other 70    NG/GT 90    IV Piggyback 20    TPN 2545 130   Total Intake(mL/kg) 3687.3 (28.9) 170.6 (1.3)   Urine (mL/kg/hr) 4180 (1.4) 125 (0.5)   Emesis/NG output     Drains 250  (0.1)    Stool 0 (0)    Total Output 4430 125   Net -742.8 +45.6          PHYSICAL EXAMINATION: General: no distress Neuro: RASS -1, will move arms to voice but no follow command  HEENT: ETT, NG in place, pupils reactive Cardiovascular: regular Lungs: even/non-labored, no wheeze Abdomen: soft, wound vac in place, LLQ colostomy Musculoskeletal: 1+ edema Skin: no rashes  LABS:  CBC  Recent Labs Lab 11/03/14 0443 11/04/14 0443 11/05/14 0421  WBC 8.2 9.7 14.0*  HGB 12.9* 12.6* 12.5*  HCT 40.5 39.6 38.0*  PLT 263 283 248   BMET  Recent Labs Lab 11/03/14 0443 11/04/14 0443 11/05/14 0421  NA 139 139 138  K 3.5 3.9 3.9  CL 95* 100 102  CO2 33* 29 25  BUN 27* 25* 23  CREATININE 1.14 0.94 1.18  GLUCOSE 182* 183* 204*   Electrolytes  Recent Labs Lab 11/02/14 0439 11/03/14 0443 11/04/14 0443 11/05/14 0421  CALCIUM 7.8* 8.5 8.4 8.4  MG 2.0 2.2 2.1  --   PHOS 4.0 3.4 3.1  --    Sepsis Markers  Recent Labs Lab 11/02/14 0439 11/05/14 0420  LATICACIDVEN 1.1 1.3   ABG  Recent Labs Lab 11/03/14 0250  PHART 7.443  PCO2ART 45.2*  PO2ART 86.6   Liver Enzymes  Recent Labs Lab 10/31/14 0300 11/04/14 0443  AST 18 23  ALT 16 24  ALKPHOS 36* 62  BILITOT 0.7 0.9  ALBUMIN 2.6* 2.7*   Glucose  Recent Labs Lab 11/04/14 0047 11/04/14 0433 11/04/14 0750 11/04/14 1241 11/04/14 1642 11/04/14 2008  GLUCAP 167* 178* 173* 195* 168* 192*    Imaging Ct Head Wo Contrast  11/03/2014   CLINICAL DATA:  Agitated on ventilator.  Altered mental status.  EXAM: CT HEAD WITHOUT CONTRAST  TECHNIQUE: Contiguous axial images were obtained from the base of the skull through the vertex without contrast.  COMPARISON:  None  FINDINGS: There is mild motion artifact on this examination. No evidence for acute hemorrhage, mass lesion, midline shift, hydrocephalus or large infarct. Patient has a nasogastric tube. There is mucosal thickening along the posterior left maxillary  sinus. No acute bone abnormality. There is subtle low-density in the right frontal white matter, best seen on sequence 2, image 22.  IMPRESSION: No acute intracranial abnormality.  Subtle white matter disease is nonspecific. Findings could represent chronic small vessel ischemic changes.  Left maxillary sinus disease.   Electronically Signed   By: Richarda OverlieAdam  Henn M.D.   On: 11/03/2014 12:01   Dg Chest Port 1 View  11/05/2014   CLINICAL DATA:  Respiratory failure with hypoxia  EXAM: PORTABLE CHEST - 1 VIEW  COMPARISON:  11/04/2014  FINDINGS: Endotracheal tube remains in good position. NG tube enters the stomach. Right arm PICC has been removed.  Interval placement of left arm PICC with the tip in the SVC in good position.  Progression of right lower lobe atelectasis. Left lower lobe atelectasis shows mild interval improvement. Negative for heart failure.  IMPRESSION: Left arm PICC tip in the lower SVC in good position.  Progression of right lower lobe atelectasis. Mild improvement in left lower lobe atelectasis.   Electronically Signed   By: Marlan Palauharles  Clark M.D.   On: 11/05/2014 07:09   Dg Chest Port 1 View  11/04/2014   CLINICAL DATA:  Acute respiratory failure with hypoxia  EXAM: PORTABLE CHEST - 1 VIEW  COMPARISON:  09/04/2015  FINDINGS: Endotracheal tube remains between the clavicular heads and the carina. The orogastric reaches the stomach. Interval shortening and coiling of the right upper extremity PICC, with the loop directed towards the right internal jugular vein. The tip is near the SVC origin.  Mild improvement in pulmonary venous congestion. There is persistent dense opacity at the left base, atelectasis versus pneumonia.  These results were called by telephone at the time of interpretation on 11/04/2014 at 6:08 am to [facility] Bre[facility], who verbally acknowledged these results.  IMPRESSION: 1. Shortening of the right upper extremity PICC with tip at the SVC origin. A catheter loop is now herniating into the right  IJ. 2. Persistent retrocardiac pneumonia or atelectasis. 3. Mild improvement in pulmonary venous congestion.   Electronically Signed   By: Tiburcio PeaJonathan  Watts M.D.   On: 11/04/2014 06:12    ASSESSMENT / PLAN:  PULMONARY ETT 1/10 >> 1/16, 1/16 >> A: Acute respiratory failure in setting of peritonitis. Basilar Atelectasis  Hx of tobacco abuse with 8 mm RLL nodule on CT abd/pelvis. Hx of OSA. P:   Wean FiO2 / PEEP to keep SaO2 > 90% Pressure support wean as tolerated  He may require trach to wean, will discuss with wife.  Consider end of week vs early next week.  D10 intubation.  F/u CXR  CARDIOVASCULAR L femoral CVC 1/11 >> 1/18 RUE PICC 1/13 >> 1/18 (coiled) LUE PICC 1/18 >>  A: Septic shock 2nd to peritonitis >> resolved. Troponin elevation. Hx of HTN. P:   Monitor hemodynamics Hold outpt lisinopril Consider QOD diuresis, last 1/19  RENAL A: Lactic Acidosis  - resolved. Hypervolemia. Hypokalemia. P:   Monitor renal fx, urine outpt, electrolytes  GASTROINTESTINAL A: Perforated Sigmoid s/p Partial Colectomy (1/10). Nutrition. P:   Post-op care, nutrition per [facility] Protonix for SUP TNA / Lipids per Pharmacy   HEMATOLOGIC A: Anemia of critical illness. P: F/u CBC SQ heparin for DVT prevention  INFECTIOUS A: Septic shock 2nd to peritonitis from perforated sigmoid colon. Left Maxillary Sinus Disease  P:   Day 7 zosyn, micafungin > d/c'd 1/18  Blood 1/11 >> neg Monitor fever curve / leukocytosis  ENDOCRINE A: Hyperglycemia. Hx of hypothyroidism.  P:   SSI  Continue synthroid IV  NEUROLOGIC A: Post-op pain control. Agitation - CT head 1/17 neg for acute abnormality.   P: RASS Goal: 0 Supportive care:  Minimize sedation as able, promote sleep wake cycle, mobilize etc  Propofol for sedation  PRN fentanyl for pain    Canary Brim, NP-C [facility] Pgr: [phone] or [phone]   Attending: I have seen and examined the patient  with nurse practitioner/resident and agree with the note above.   Lung sounds improved today Agitated overnight, better with propofol  We are slowly weaning his FiO2, CXR improving May need Tracheostomy Continue pulmonary toilette  CC time 35 minutes  Heber Enon, MD [facility] Pager: [phone] Cell: [phone] If no response, call [phone]

## 2014-11-05 NOTE — Progress Notes (Signed)
PARENTERAL NUTRITION CONSULT NOTE   Pharmacy Consult for TPN Indication: Prolonged ileus  Allergies  Allergen Reactions  . Aloprim [Allopurinol] Other (See Comments)    Unknown reaction  . Simvastatin Other (See Comments)    Unknown reaction    Patient Measurements: Height: 5' 11.5" (181.6 cm) Weight: 281 lb 1.4 oz (127.5 kg) IBW/kg (Calculated) : 76.45 Adjusted Body Weight: 99kg Usual Weight: 114kg  Vital Signs: Temp: 99.8 F (37.7 C) (01/19 0400) Temp Source: Oral (01/19 0400) BP: 140/79 mmHg (01/19 0600) Pulse Rate: 86 (01/19 0324) Intake/Output from previous day: 01/18 0701 - 01/19 0700 In: 3677.3 [P.O.:120; I.V.:832.3; NG/GT:90; IV Piggyback:20; TPN:2545] Out: 4430 [Urine:4180; Drains:250]  Labs:  Recent Labs  11/03/14 0443 11/04/14 0443 11/05/14 0421  WBC 8.2 9.7 14.0*  HGB 12.9* 12.6* 12.5*  HCT 40.5 39.6 38.0*  PLT 263 283 248     Recent Labs  11/03/14 0443 11/04/14 0443 11/05/14 0421  NA 139 139 138  K 3.5 3.9 3.9  CL 95* 100 102  CO2 33* 29 25  GLUCOSE 182* 183* 204*  BUN 27* 25* 23  CREATININE 1.14 0.94 1.18  CALCIUM 8.5 8.4 8.4  MG 2.2 2.1  --   PHOS 3.4 3.1  --   PROT  --  6.3  --   ALBUMIN  --  2.7*  --   AST  --  23  --   ALT  --  24  --   ALKPHOS  --  62  --   BILITOT  --  0.9  --   PREALBUMIN  --  15.9*  --   TRIG  --  191*  --    Estimated Creatinine Clearance: 90.1 mL/min (by C-G formula based on Cr of 1.18).    Recent Labs  11/04/14 1241 11/04/14 1642 11/04/14 2008  GLUCAP 195* 168* 192*   Insulin Requirements in past 24 hours:  18 units of moderate SSI Novolog  Current Nutrition: Clinimix-E 5/15 at 120 mL/hr NPO  [facility]  Central access: Left UE PICC (11/04/14) TPN start date: 1/13  ASSESSMENT                                                                                                          HPI: [age] y/oM with HTN and nephrolithiasis [facility] with abdominal pain of 1 day duration  secondary to a perforated sigmoid d/t diverticulitis. He was taken emergently to OR on 1/10 and is now s/p partial colectomy. Orders received to start TPN for anticipated prolonged ileus 1/13.  Significant events:  1/13: VDRF post-op remains on propofol (high needs) and pressor support 1/14: Requiring max doses of propofol (7350mcg/kg/min = 38.351ml/hr) 1/15: Propofol at 30 mcg/kg/min (22.9 mL/hr).  Vital HP tube feeding started at fixed rate of 20 mL/hr 1/16: Propofol at 40 mcg/kg/min (30.5 mL/hr).  Extubated (TF and propofol DC), but required reintubation due to agitation.  1/18: Rt UE PICC is coiled - per MD ok to continue TPN infusion through this line for now.  PICC line  replaced in left UE and restarted new TPN at 1800 in new line. 1/19 Proprofol resumed.  No ostomy output, flatus only.  Trial trophic TF @ 10 ml/hr.  Today, 11/05/2014:   Glucose:  CBGs slightly elevated but < 200 on SSI   At goal rate of Clinimix 5/15 120 ml/hr, glucose infusion rate will be 2.35 mg/kg/min  Electrolytes: WNL.  K improved after replacement.  Renal: SCr increased to 1.18  I/O: -  LFTs: all below ULN 1/18)  TGs: 270 (1/14), 191 (1/18)  Prealbumin: 9.7 (1/14), 15.9 (1/18)  NUTRITIONAL GOALS                                                                                              RD recs (1/17): 160 g protein/day, 1800-2000 kcal/d  Clinimix-E 5/15 at a goal rate of 120 ml/hr + 20% fat emulsion at 46ml/hr on Monday and Thursdays to provide: 144 g/day protein, avg of 2180 kcal/day. (Goal rate meets 90% of estimated protein needs and 100% of estimated kcal needs)  Propofol provides 1.1 kcal/mL.  Current infusion rate of ~ 30 ml/hr, propofol will provide ~ 33 kcal/hr and ~ 792 kcal/day.  Vital HP @ 10 ml/hr to provide:  240 kcal/24hr and 21 g protein/24h  PLAN                                                                                                                         1. At 1800  today:  Continue Clinimix-E 5/15 at 120 mL/hr (goal rate).  20% Fat Emulsion on Monday/Thursday schedule only.  None today, Tuesday.  Consider holding fat emulsion if receiving high rate Propofol infusion.  Monitor TG closely on Propofol infusion.  TPN to contain standard multivitamins and trace elements. 2. Continue CBGs and moderate SSI Novolog q4h 3. Full TPN lab panels on Mondays and Thursdays.  BMET tomorrow. 4. Follow clinical course daily.  F/u tube feed tolerance and rate of Propofol infusion.  Lynann Beaver PharmD, BCPS Pager [phone] 11/05/2014 7:50 AM

## 2014-11-06 ENCOUNTER — Inpatient Hospital Stay (HOSPITAL_COMMUNITY): Payer: 59

## 2014-11-06 DIAGNOSIS — J9811 Atelectasis: Secondary | ICD-10-CM

## 2014-11-06 DIAGNOSIS — I10 Essential (primary) hypertension: Secondary | ICD-10-CM

## 2014-11-06 LAB — CBC
HEMATOCRIT: 36.9 % — AB (ref 39.0–52.0)
Hemoglobin: 11.8 g/dL — ABNORMAL LOW (ref 13.0–17.0)
MCH: 31.1 pg (ref 26.0–34.0)
MCHC: 32 g/dL (ref 30.0–36.0)
MCV: 97.4 fL (ref 78.0–100.0)
Platelets: 385 10*3/uL (ref 150–400)
RBC: 3.79 MIL/uL — ABNORMAL LOW (ref 4.22–5.81)
RDW: 14.6 % (ref 11.5–15.5)
WBC: 12.3 10*3/uL — AB (ref 4.0–10.5)

## 2014-11-06 LAB — GLUCOSE, CAPILLARY
GLUCOSE-CAPILLARY: 296 mg/dL — AB (ref 70–99)
GLUCOSE-CAPILLARY: 292 mg/dL — AB (ref 70–99)
GLUCOSE-CAPILLARY: 275 mg/dL — AB (ref 70–99)
Glucose-Capillary: 311 mg/dL — ABNORMAL HIGH (ref 70–99)
Glucose-Capillary: 234 mg/dL — ABNORMAL HIGH (ref 70–99)
Glucose-Capillary: 259 mg/dL — ABNORMAL HIGH (ref 70–99)

## 2014-11-06 LAB — BASIC METABOLIC PANEL
ANION GAP: 8 (ref 5–15)
BUN: 30 mg/dL — ABNORMAL HIGH (ref 6–23)
CALCIUM: 8.2 mg/dL — AB (ref 8.4–10.5)
CO2: 26 mmol/L (ref 19–32)
Chloride: 100 mEq/L (ref 96–112)
Creatinine, Ser: 1.11 mg/dL (ref 0.50–1.35)
GFR calc Af Amer: 81 mL/min — ABNORMAL LOW (ref >90)
GFR calc non Af Amer: 70 mL/min — ABNORMAL LOW (ref >90)
GLUCOSE: 303 mg/dL — AB (ref 70–99)
Potassium: 3.9 mmol/L (ref 3.5–5.1)
Sodium: 134 mmol/L — ABNORMAL LOW (ref 135–145)

## 2014-11-06 MED ORDER — METOPROLOL TARTRATE 25 MG/10 ML ORAL SUSPENSION
25.0000 mg | Freq: Two times a day (BID) | ORAL | Status: DC
  Administered 2014-11-06 – 2014-11-17 (×21): 25 mg
  Filled 2014-11-06 (×27): qty 10

## 2014-11-06 MED ORDER — TRACE MINERALS CR-CU-F-FE-I-MN-MO-SE-ZN IV SOLN
INTRAVENOUS | Status: AC
  Administered 2014-11-06: 19:00:00 via INTRAVENOUS
  Filled 2014-11-06: qty 1680

## 2014-11-06 MED ORDER — VITAL HIGH PROTEIN PO LIQD
1000.0000 mL | ORAL | Status: DC
  Administered 2014-11-06: 1000 mL
  Filled 2014-11-06: qty 1000

## 2014-11-06 MED ORDER — NYSTATIN 100000 UNIT/GM EX POWD
Freq: Two times a day (BID) | CUTANEOUS | Status: DC
  Administered 2014-11-06 – 2014-11-19 (×27): via TOPICAL
  Administered 2014-11-19: 1 via TOPICAL
  Administered 2014-11-20 – 2014-11-21 (×3): via TOPICAL
  Filled 2014-11-06: qty 15

## 2014-11-06 MED ORDER — INSULIN ASPART 100 UNIT/ML ~~LOC~~ SOLN
0.0000 [IU] | SUBCUTANEOUS | Status: DC
  Administered 2014-11-06: 11 [IU] via SUBCUTANEOUS
  Administered 2014-11-06: 15 [IU] via SUBCUTANEOUS
  Administered 2014-11-06: 11 [IU] via SUBCUTANEOUS
  Administered 2014-11-07 (×4): 7 [IU] via SUBCUTANEOUS
  Administered 2014-11-07 (×2): 4 [IU] via SUBCUTANEOUS
  Administered 2014-11-08: 3 [IU] via SUBCUTANEOUS
  Administered 2014-11-08: 4 [IU] via SUBCUTANEOUS
  Administered 2014-11-08 (×3): 3 [IU] via SUBCUTANEOUS
  Administered 2014-11-08: 4 [IU] via SUBCUTANEOUS
  Administered 2014-11-09: 3 [IU] via SUBCUTANEOUS
  Administered 2014-11-09: 4 [IU] via SUBCUTANEOUS
  Administered 2014-11-09 – 2014-11-11 (×8): 3 [IU] via SUBCUTANEOUS
  Administered 2014-11-12: 4 [IU] via SUBCUTANEOUS
  Administered 2014-11-13 – 2014-11-14 (×8): 3 [IU] via SUBCUTANEOUS
  Administered 2014-11-14: 4 [IU] via SUBCUTANEOUS
  Administered 2014-11-14 – 2014-11-15 (×2): 3 [IU] via SUBCUTANEOUS
  Administered 2014-11-15: 4 [IU] via SUBCUTANEOUS
  Administered 2014-11-15 – 2014-11-17 (×11): 3 [IU] via SUBCUTANEOUS
  Administered 2014-11-18: 4 [IU] via SUBCUTANEOUS

## 2014-11-06 MED ORDER — VITAL HIGH PROTEIN PO LIQD
1000.0000 mL | ORAL | Status: DC
  Administered 2014-11-07 – 2014-11-08 (×3): 1000 mL
  Administered 2014-11-08 (×2)
  Administered 2014-11-09: 1000 mL
  Administered 2014-11-09 – 2014-11-10 (×11)
  Administered 2014-11-10: 1000 mL
  Administered 2014-11-10: 02:00:00
  Administered 2014-11-11: 1000 mL
  Administered 2014-11-11 (×4)
  Filled 2014-11-06 (×7): qty 1000

## 2014-11-06 MED ORDER — PRO-STAT SUGAR FREE PO LIQD
30.0000 mL | Freq: Three times a day (TID) | ORAL | Status: DC
  Administered 2014-11-06 – 2014-11-07 (×3): 30 mL via ORAL
  Filled 2014-11-06 (×7): qty 30

## 2014-11-06 MED ORDER — POTASSIUM CHLORIDE 20 MEQ/15ML (10%) PO SOLN
40.0000 meq | Freq: Two times a day (BID) | ORAL | Status: AC
  Administered 2014-11-06 – 2014-11-07 (×4): 40 meq via ORAL
  Filled 2014-11-06 (×5): qty 30

## 2014-11-06 MED ORDER — POTASSIUM CHLORIDE CRYS ER 20 MEQ PO TBCR
40.0000 meq | EXTENDED_RELEASE_TABLET | Freq: Two times a day (BID) | ORAL | Status: DC
  Filled 2014-11-06: qty 2

## 2014-11-06 NOTE — Consult Note (Signed)
WOC wound consult note Reason for Consult:  VAC Dressing change to midline abdominal wound.  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  Wound bed: Beefy red granulation tissue noted.  Black foam used to fill dead space in wound.  Drainage (amount, consistency, odor) Moderate, serosanguinous drainage.  Periwound: Erythema noted at previous dressing change has resolved.  Blood filled blister noted around colostomy site.  William protect with a barrier ring.  1 Piece black granufoam used to fill wound bed. Covered with drape dressing.   Dressing procedure/placement/frequency: NPWT to continue at 125 mmHg, continuous therapy. William Marlyne BeardsJennings, PA and bedside nurse in the room during dressing change.     WOC ostomy consult note Stoma type/location:  LLQ Colostomy oval stoma.  Stomal assessment/size: no change since Monday.  Wife at bedside and cut barrier to fit.  Barrier ring to peristomal skin and covering 0.5 cm x 1.5 cm blood filled blister noted.  Peristomal assessment: Blood filled blister protected with barrier ring.  Treatment options for stomal/peristomal skin: Barrier ring.  Ostomy pouching: 2pc.2 3/4" pouch with barrier ring.   Education provided: Wife at bedside and performed pouch change today.  Discussed peristomal cleansing, frequency of pouch change and wife performed the pouch change.   WOC team William remain available.  Bedside nurse can perform VAC dressing change and ostomy care.  William [name] [name] RN BSN CWON Pager [phone]

## 2014-11-06 NOTE — Progress Notes (Signed)
PULMONARY / CRITICAL CARE MEDICINE   Name: William [name] MRN: [id number] DOB: 07/20/1951    ADMISSION DATE:  10/27/2014  CHIEF COMPLAINT:  Perforated sigmoid colon  BRIEF PATIENT DESCRIPTION:   64 y/o male former smoker presented with abd pain from perforated sigmoid colon taken to OR emergently.  Remained on vent post op.  Failed extubation 1/16, reintubated.    SIGNIFICANT EVENTS:  1/10  Admit, Partial colectomy  1/13  Off pressors 1/14  On TNA 1/16  Extubated >> reintubated in PM 1/19  Agitation, hypertension overnight, sedation changed to propofol. 60% / 8 1/20 PEEP weaned to 5, FiO2 40%, diuresing  STUDIES: 1/10  CT abd/pelvis >> sigmoid colon diverticulitis with perforation, umbilical hernia, b/l renal stones, 8 mm RLL nodule 1/11  ECHO >> EF 55-60%, nml systolic fxn, PA peak 32   1/17  CT Head >> no acute intracranial abnormality, ? Small vessel ischemic changes, L maxillary sinus disease  SUBJECTIVE:  1/20 PEEP weaned to 5, FiO2 40%, diuresing, otherwise no acute events   VITAL SIGNS: Temp:  [98.7 F (37.1 C)-100.3 F (37.9 C)] 100.3 F (37.9 C) (01/20 0800) Pulse Rate:  [89-106] 103 (01/20 0000) Resp:  [18-26] 24 (01/20 0700) BP: (97-160)/(38-76) 119/65 mmHg (01/20 0700) SpO2:  [90 %-100 %] 94 % (01/20 0700) FiO2 (%):  [40 %-50 %] 40 % (01/20 0327) Weight:  [125 kg (275 lb 9.2 oz)] 125 kg (275 lb 9.2 oz) (01/20 0400)   VENTILATOR SETTINGS: Vent Mode:  [-] PRVC FiO2 (%):  [40 %-50 %] 40 % Set Rate:  [18 bmp] 18 bmp Vt Set:  [610 mL] 610 mL PEEP:  [5 cmH20-8 cmH20] 5 cmH20 Plateau Pressure:  [13 cmH20-27 cmH20] 19 cmH20   INTAKE / OUTPUT: Intake/Output      01/19 0701 - 01/20 0700 01/20 0701 - 01/21 0700   P.O.     I.V. (mL/kg) 964 (7.7)    Other     NG/GT 160    IV Piggyback     TPN 2720    Total Intake(mL/kg) 3844 (30.8)    Urine (mL/kg/hr) 4010 (1.3)    Drains 50 (0)    Stool 401 (0.1)    Total Output 4461     Net -617            PHYSICAL  EXAMINATION:  Gen: sedated on vent HEENT: NCAT EOMi, ETT PULM: few rhonchi bases CV: tachy, no mgr AB: infrequent bowel sounds, midline scar Ext: SCD Neuro: sedated on vent, maew  LABS:  CBC  Recent Labs Lab 11/04/14 0443 11/05/14 0421 11/06/14 0545  WBC 9.7 14.0* 12.3*  HGB 12.6* 12.5* 11.8*  HCT 39.6 38.0* 36.9*  PLT 283 248 385   BMET  Recent Labs Lab 11/04/14 0443 11/05/14 0421 11/06/14 0545  NA 139 138 134*  K 3.9 3.9 3.9  CL 100 102 100  CO2 BUN 25* 23 30*  CREATININE 0.94 1.18 1.11  GLUCOSE 183* 204* 303*   Electrolytes  Recent Labs Lab 11/02/14 0439 11/03/14 0443 11/04/14 0443 11/05/14 0421 11/06/14 0545  CALCIUM 7.8* 8.5 8.4 8.4 8.2*  MG 2.0 2.2 2.1  --   --   PHOS 4.0 3.4 3.1  --   --    Sepsis Markers  Recent Labs Lab 11/02/14 0439 11/05/14 0420  LATICACIDVEN 1.1 1.3   ABG  Recent Labs Lab 11/03/14 0250  PHART 7.443  PCO2ART 45.2*  PO2ART 86.6   Liver Enzymes  Recent Labs  Lab 10/31/14 0300 11/04/14 0443  AST 18 23  ALT 16 24  ALKPHOS 36* 62  BILITOT 0.7 0.9  ALBUMIN 2.6* 2.7*   Glucose  Recent Labs Lab 11/05/14 0018 11/05/14 0432 11/05/14 0809 11/05/14 1122 11/05/14 1539 11/05/14 1954  GLUCAP 187* 189* 220* 182* 246* 233*    Imaging Dg Chest Port 1 View  11/06/2014   CLINICAL DATA:  Respiratory failure. Evaluate endotracheal tube positioning  EXAM: PORTABLE CHEST - 1 VIEW  COMPARISON:  11/05/2014; 11/04/2014; 11/03/2014  FINDINGS: Grossly unchanged enlarged cardiac silhouette and mediastinal contours given persistently reduced lung volumes. Minimal retraction of left upper extremity approach PICC line with tip now projected over the mid SVC. Pulmonary vasculature is indistinct with cephalization of flow. Interval development of small bilateral effusions and associated worsening bibasilar heterogeneous/consolidative opacities. No pneumothorax. Unchanged bones.  IMPRESSION: 1. Minimal retraction of left  upper extremity approach PICC line with tip now projected over the mid SVC. Otherwise, stable position of support apparatus. No pneumothorax. 2. Findings compatible with worsening pulmonary edema, now was small effusions and associated worsening bibasilar heterogeneous/consolidative opacities, atelectasis versus infiltrate.   Electronically Signed   By: Simonne ComeJohn  Watts M.D.   On: 11/06/2014 07:18   Dg Chest Port 1 View  11/05/2014   CLINICAL DATA:  Respiratory failure with hypoxia  EXAM: PORTABLE CHEST - 1 VIEW  COMPARISON:  11/04/2014  FINDINGS: Endotracheal tube remains in good position. NG tube enters the stomach. Right arm PICC has been removed.  Interval placement of left arm PICC with the tip in the SVC in good position.  Progression of right lower lobe atelectasis. Left lower lobe atelectasis shows mild interval improvement. Negative for heart failure.  IMPRESSION: Left arm PICC tip in the lower SVC in good position.  Progression of right lower lobe atelectasis. Mild improvement in left lower lobe atelectasis.   Electronically Signed   By: Marlan Palauharles  Clark M.D.   On: 11/05/2014 07:09    ASSESSMENT / PLAN:  PULMONARY ETT 1/10 >> 1/16, 1/16 >> A: Acute respiratory failure due to atelectasis, pulm edema Hx of tobacco abuse with 8 mm RLL nodule on CT abd/pelvis Hx of OSA P:   Wean FiO2 / PEEP to keep SaO2 > 90% Pressure support wean as tolerated  He may require trach to wean, will discuss with wife.  Consider end of week vs early next week.  D11 intubation.  F/u CXR Bag lavage today for atelectasis  CARDIOVASCULAR L femoral CVC 1/11 >> 1/18 RUE PICC 1/13 >> 1/18 (coiled) LUE PICC 1/18 >>  A: Septic shock 2nd to peritonitis >> resolved Demand ischemia > resolved HTN P:   Monitor hemodynamics Hold outpatient lisinopril for now, add metoprolol 25mg  bid Diurese again 1/20  RENAL A: Lactic Acidosis  - resolved Hypervolemia Hypokalemia P:   Monitor renal fx, urine outpt,  electrolytes Daily KCL replacement  GASTROINTESTINAL A: Perforated Sigmoid s/p Partial Colectomy (1/10) Nutrition P:   Post-op care, nutrition per [facility] Protonix for SUP TNA / Lipids per Pharmacy   HEMATOLOGIC A: Anemia of critical illness P: F/u CBC SQ heparin for DVT prevention  INFECTIOUS A: Septic shock 2nd to peritonitis from perforated sigmoid colon > resolved Left Maxillary Sinus Disease  P:   7 days zosyn, micafungin > d/c'd 1/18  Blood 1/11 >> neg Monitor fever curve / leukocytosis  ENDOCRINE A: Hyperglycemia Hx of hypothyroidism P:   SSI  Continue synthroid IV  NEUROLOGIC A: Post-op pain control Agitation - CT head 1/17 neg  for acute abnormality P: RASS Goal: 0 Supportive care:  Minimize sedation as able, promote sleep wake cycle, mobilize etc  Propofol for sedation  PRN fentanyl for pain   Family: Wife updated bedside 1/20  Today's Summary : Weaning O2 well, continue diuresis, bag lavage for pulm toilette, add BP medication  CC time 35 minutes  Heber Hollis, MD [place] [facility] Pager: [phone] Cell: [phone] If no response, call [phone]

## 2014-11-06 NOTE — Progress Notes (Addendum)
NUTRITION FOLLOW UP  Intervention:   -TPN per pharmacy -Diet advancement per MD -Advance trickle feeds of Vital HP @ 10 ml/hr to 15 ml/hr for next 24 hours per surgery  If TF warranted (will be adjusted if pt still on Propofol): -Recommend Initiate Vital HP@ 20 ml/hr via NGT and increase by 10 ml every 4 hours to goal rate of 75 ml/hr.   Tube feeding regimen provides 1800 kcal (73% of needs), 158 grams of protein, and 1505 ml of H2O.   -RD to continue to monitor  Nutrition Dx:   Inadequate oral intake related to inability to eat as evidenced by NPO; ongoing  Goal:   Parenteral nutrition to provide 60-70% of estimated calorie needs (22-25 kcals/kg ideal body weight) and 100% of estimated protein needs, based on ASPEN guidelines for hypocaloric, high protein feeding in critically ill obese individuals  Monitor:   TPN tolerance, TF initiation, GI profile, weight trend, vent status/settings, labs  Assessment:   1/11: Pt s/p partial colectomy 10/27/14.  - Pt with bleeding from surgical sites.  - Spoke with pt's wife who reported a recent increase in pt's weight (30 lbs in 2-3 months) due to "stress eating." She says that pt will often eat lunch that she packs for him at work as well as food provided to him. She reports an increase in the consumption of starchy foods such as potato chips and breads.   1/13: -Pt remains intubated and sedated, gradually wean of vent and pressors d/t ARDs and obesity/distension per MD note -RD consulted for initiation of TPN  -Wt increased 13 lbs since 1/11; likely d/t 5.7L fluid resuscitation; currently +800 ml output -WOC following for ostomy care -Monitor triglycerides with propofol, may need to administer TPN lipids 2-3x/week vs every day -CBG elevated > 150 mg/dL, monitor with initiation of TPN -K elevated at 6.1, now WNL -Low Phos, being repleted with [facility]  1/17 -RD consulted for TF initiation and management -1/16 Pt was extubated, but failed.  Pt re-intubated later on 1/16. -Pt was started on Vital HP at 20 ml/hr but that was d/c along with propofol infusion. -Spoke with student RN who is to have RN contact RD regarding plans for TF -Pt is currently receiving Clinimix-E 5/15 at 80 mL/hr (~66% of goal rate). Per pharmacy, TPN will be increased to 95 ml/hr at 1800 today. -Wt is back down to admission weight -CBGs still >150 mg/dL -BUN elevated, Mg/Phos WNL  1/19 -RD consulted to begin trickle feeds of Vital HP @ 10 ml/hr over 24 hour period. -Pt continues to receive TPN per pharmacy: Clinimix-E 5/15 120 ml/hr + 20% fat emulsion at 19ml/hr on Monday and Thursdays to provide: 144 g/day protein, avg of 2180 kcal/day. (Goal rate meets 90% of estimated protein needs and 100% of estimated kcal needs) -Pt is now sedated using Propofol contributing 404 fat kcal. -CBGs: 187-220 mg/dL  6/04 -Trickle feed to be advanced to 15 ml/hr per surgery -Per diabetes coordinator, recommends insulin be added to TPN. CBGs have been elevating (182-246 mg/dL) -Propofol infusion has been increased, contributing 808 fat kcal -Ostomy output: 200 ml -Pt continue to receive TPN at goal -Elevated BUN, low Na  Patient is currently intubated on ventilator support MV: 16.6 L/min Temp (24hrs), Avg:99.7 F (37.6 C), Min:98.7 F (37.1 C), Max:100.3 F (37.9 C)  Propofol: 30.6 ml/hr -provides 808 fat kcal  Height: Ht Readings from Last 1 Encounters:  10/28/14 5' 11.5" (1.816 m)    Weight Status:  Wt Readings from Last 1 Encounters:  11/06/14 275 lb 9.2 oz (125 kg)  10/30/14 293 lb 10/27/14 280 lb  Re-estimated needs(1/19):  Kcal: 2683 (1800-2000 kcal = 22-25 kcal/kg IBW) Protein:~160 gram protein Fluid: per MD  Skin:  Surgical incision on abd, +1 generalized edema, +1 perineal edema, +1 RLE, +1 LLE edema  Diet Order: Diet NPO time specified .TPN (CLINIMIX-E) Adult   Intake/Output Summary (Last 24 hours) at 11/06/14 1020 Last data filed at  11/06/14 1000  Gross per 24 hour  Intake 4020.96 ml  Output   4495 ml  Net -474.04 ml    Last BM: 1/20 -colostomy   Labs:   Recent Labs Lab 11/02/14 0439 11/03/14 0443 11/04/14 0443 11/05/14 0421 11/06/14 0545  NA 130* 139 139 138 134*  K 3.5 3.5 3.9 3.9 3.9  CL 93* 95* 100 102 100  CO2 31 33* 29 25 26   BUN 20 27* 25* 23 30*  CREATININE 1.01 1.14 0.94 1.18 1.11  CALCIUM 7.8* 8.5 8.4 8.4 8.2*  MG 2.0 2.2 2.1  --   --   PHOS 4.0 3.4 3.1  --   --   GLUCOSE 191* 182* 183* 204* 303*    CBG (last 3)   Recent Labs  11/05/14 1122 11/05/14 1539 11/05/14 1954  GLUCAP 182* 246* 233*    Scheduled Meds: . antiseptic oral rinse  7 mL Mouth Rinse QID  . chlorhexidine  15 mL Mouth Rinse BID  . feeding supplement (VITAL HIGH PROTEIN)  1,000 mL Per Tube Q24H  . furosemide  40 mg Intravenous Q8H  . heparin  5,000 Units Subcutaneous 3 times per day  . insulin aspart  0-20 Units Subcutaneous 6 times per day  . levothyroxine  75 mcg Intravenous Daily  . lip balm  1 application Topical BID  . metoprolol tartrate  25 mg Per Tube BID  . nystatin   Topical BID  . pantoprazole (PROTONIX) IV  40 mg Intravenous Q24H  . potassium chloride  40 mEq Oral BID  . sodium chloride  10-40 mL Intracatheter Q12H    Continuous Infusions: . Marland Kitchen.TPN (CLINIMIX-E) Adult 120 mL/hr at 11/05/14 1720  . sodium chloride 10 mL/hr at 11/04/14 2305  . propofol 40 mcg/kg/min (11/06/14 0927)    Tilda FrancoLindsey Kalvyn Desa, MS, RD, LDN Pager: [phone] After Hours Pager: [phone]

## 2014-11-06 NOTE — Progress Notes (Signed)
Inpatient Diabetes Program Recommendations  AACE/ADA: New Consensus Statement on Inpatient Glycemic Control (2013)  Target Ranges:  Prepandial:   less than 140 mg/dL      Peak postprandial:   less than 180 mg/dL (1-2 hours)      Critically ill patients:  140 - 180 mg/dL     Results for Johnney OuJENNINGS, [place] (MRN [id number]) as of 11/06/2014 09:17  Ref. Range 11/05/2014 00:18 11/05/2014 04:32 11/05/2014 08:09 11/05/2014 11:22 11/05/2014 15:39 11/05/2014 19:54  Glucose-Capillary Latest Range: 70-99 mg/dL 875187 (H) 643189 (H) 329220 (H) 182 (H) 246 (H) 233 (H)    Results for Johnney OuJENNINGS, [place] (MRN [id number]) as of 11/06/2014 09:17  Ref. Range 11/06/2014 05:45  Glucose Latest Range: 70-99 mg/dL 630303 (H)     **Note trickle tube feeds of Vital High Protein started yesterday at 1:30pm.  Note plan to increase Tube feeds to 15 cc/hr today.  **Patient getting TPN at 120 cc/hour.  Currently there is no insulin in the TPN mixture.  **Current Insulin Orders: Novolog Moderate SSI Q4 hours  **Glucose levels consistently elevated     Pharmacy: Do you plan to add insulin to TPN?   MD- Please consider increasing Novolog SSI to Resistant scale Q4 hours     Will follow Ambrose FinlandJeannine Johnston Oretta Berkland RN, MSN, CDE Diabetes Coordinator Inpatient Diabetes Program Team Pager: [phone] (8a-10p)

## 2014-11-06 NOTE — Progress Notes (Signed)
PARENTERAL NUTRITION CONSULT NOTE   Pharmacy Consult for TPN Indication: Prolonged ileus  Allergies  Allergen Reactions  . Aloprim [Allopurinol] Other (See Comments)    Unknown reaction  . Simvastatin Other (See Comments)    Unknown reaction    Patient Measurements: Height: 5' 11.5" (181.6 cm) Weight: 275 lb 9.2 oz (125 kg) IBW/kg (Calculated) : 76.45 Adjusted Body Weight: 99kg Usual Weight: 114kg  Vital Signs: Temp: 100.1 F (37.8 C) (01/20 0400) Temp Source: Oral (01/20 0400) BP: 114/74 mmHg (01/20 0300) Pulse Rate: 103 (01/20 0000) Intake/Output from previous day: 01/19 0701 - 01/20 0700 In: 2962.7 [I.V.:602.7; NG/GT:120; TPN:2240] Out: 3886 [Urine:3435; Drains:50; Stool:401]  Labs:  Recent Labs  11/04/14 0443 11/05/14 0421 11/06/14 0545  WBC 9.7 14.0* 12.3*  HGB 12.6* 12.5* 11.8*  HCT 39.6 38.0* 36.9*  PLT 283 248 385     Recent Labs  11/04/14 0443 11/05/14 0421 11/06/14 0545  NA 139 138 134*  K 3.9 3.9 3.9  CL 100 102 100  CO2 29 25 26   GLUCOSE 183* 204* 303*  BUN 25* 23 30*  CREATININE 0.94 1.18 1.11  CALCIUM 8.4 8.4 8.2*  MG 2.1  --   --   PHOS 3.1  --   --   PROT 6.3  --   --   ALBUMIN 2.7*  --   --   AST 23  --   --   ALT 24  --   --   ALKPHOS 62  --   --   BILITOT 0.9  --   --   PREALBUMIN 15.9*  --   --   TRIG 191*  --   --    Estimated Creatinine Clearance: 94.8 mL/min (by C-G formula based on Cr of 1.11).    Recent Labs  11/05/14 1122 11/05/14 1539 11/05/14 1954  GLUCAP 182* 246* 233*   Insulin Requirements in past 24 hours:  31 units of moderate SSI Novolog  Current Nutrition:  Clinimix-E 5/15 at 120 mL/hr  Vital HP @ 15 ml/hr  Prostat TID  Propofol infusion currently at 30 ml/hr  Diet: NPO  IVF: KVO  Central access: Left UE PICC (11/04/14) TPN start date: 1/13  ASSESSMENT                                                                                                          HPI: [id number] y/oM with HTN and  nephrolithiasis who [facility] with abdominal pain of 1 day duration secondary to a perforated sigmoid d/t diverticulitis. He was taken emergently to OR on 1/10 and is now s/p partial colectomy. Orders received to start TPN for anticipated prolonged ileus 1/13.  Significant events:  1/13: VDRF post-op remains on propofol (high needs) and pressor support 1/14: Requiring max doses of propofol (8150mcg/kg/min = 38.371ml/hr) 1/15: Propofol at 30 mcg/kg/min (22.9 mL/hr).  Vital HP tube feeding started at fixed rate of 20 mL/hr 1/16: Propofol at 40 mcg/kg/min (30.5 mL/hr).  Extubated (TF and propofol DC), but required reintubation due to agitation.  1/18:  Rt UE PICC is coiled - per MD ok to continue TPN infusion through this line for now.  PICC line replaced in left UE and restarted new TPN at 1800 in new line. 1/19 Proprofol resumed.  No ostomy output, flatus only.  Trial trophic TF @ 10 ml/hr. 1/20 Proprofol infusion remains at ~30 ml/hr.  Rn states no residuals with TF.  Slight increase of TF rate and OK to add prostat.  First stool coming from ostomy; plan to use TF alone when gut function is verified.  Today, 11/06/2014:   Glucose:  CBGs have been elevated, but near goal of < 180 with only SSI.  CBGs are now more consistently elevated.  24 hour range of 189-246, with largest increase after TF started on 1/19.  Electrolytes: WNL except Na low (unable to adjust in premix TPN).  KCl supplement ( BID x4 doses) per MD order.  Renal: SCr 1.11  I/O: -  LFTs: all below ULN (1/18)  TGs: 270 (1/14), 191 (1/18)  Prealbumin: 9.7 (1/14), 15.9 (1/18)  NUTRITIONAL GOALS                                                                                              RD recs (updated 1/19)  Needs: 160 g protein/day, 1800-2000 kcal/d  Goal: Parenteral nutrition to provide 60-70% of estimated calorie needs (22-25 kcals/kg ideal body weight) and 100% of estimated protein needs, based on  ASPEN guidelines for hypocaloric, high protein feeding in critically ill obese individuals  Vital HP@ goal rate of 75 ml/hr =  1800 kcal (73% of needs), 158 grams of protein  Clinimix-E 5/15 at a goal rate of 120 ml/hr + 20% fat emulsion at 35ml/hr on Monday and Thursdays to provide: 144 g/day protein, avg of 2180 kcal/day. (Goal rate meets 90% of estimated protein needs and 100% of estimated kcal needs)  Modified plan  Clinimix-E 5/15 at 70 ml/hr (w/o lipids) = 1193 kcal, 84 g protein  Vital HP @ 15 ml/hr = 360 kcal and 31.5 g protein  Prostat protein supplement (30mL) TID = 300 kcal and 45g protein  Propofol @ 30 ml/hr = 33 kcal/hr and ~ 792 kcal/24hr.  Clinimix +TF + Propofol will provide: 2645 kcal/24h and 160.5 g protein/day   PLAN                                                                                                                         Now: 1. Add prostat protein supplement TID per tube.  At 1800 today: 1. Decrease to Clinimix-E 5/15 at 70 mL/hr  TPN to contain  standard multivitamins and trace elements. 2. HOLD fat emulsion  Previously scheduled for Monday/Thursday administration d/t nutritional goals  Currently held d/t Propofol infusion  Monitor TG closely on Monday and Thursdays 3. Continue CBGs and increase to resistant SSI Novolog q4h  Delay adding insulin to TPN with large rate decrease on 1/20 PM 4. Full TPN lab panels on Mondays and Thursdays.  5. Follow clinical course daily.  F/u tube feed tolerance and rate of Propofol infusion.  Lynann Beaver PharmD, BCPS Pager [phone] 11/06/2014 7:11 AM

## 2014-11-06 NOTE — Progress Notes (Signed)
9 Days Post-Op  Subjective: He is still sedated on the vent, but he is still very active.  He seems more comfortable on larger bed.  He has stool coming from his ostomy.  This is the first real stool I have seen.    Objective: Vital signs in last 24 hours: Temp:  [98.7 F (37.1 C)-100.3 F (37.9 C)] 100.3 F (37.9 C) (01/20 0800) Pulse Rate:  [89-106] 103 (01/20 0000) Resp:  [18-26] 24 (01/20 0700) BP: (97-160)/(38-76) 119/65 mmHg (01/20 0700) SpO2:  [90 %-100 %] 94 % (01/20 0700) FiO2 (%):  [40 %-50 %] 40 % (01/20 0327) Weight:  [125 kg (275 lb 9.2 oz)] 125 kg (275 lb 9.2 oz) (01/20 0400) Last BM Date: 11/05/14 160 TF yesterday 400 from the colostomy . Marland KitchenTPN (CLINIMIX-E) Adult 120 mL/hr at 11/05/14 1720  . sodium chloride 10 mL/hr at 11/04/14 2305  . propofol 30 mcg/kg/min (11/06/14 0817)  TM 100.3, low grade fever since  11PM last evening. On vent, RR 20's Labs stable CXR shows worsening pulmonary edema, with small effusions   Intake/Output from previous day: 01/19 0701 - 01/20 0700 In: 3844 [I.V.:964; NG/GT:160; TPN:2720] Out: 4461 [Urine:4010; Drains:50; Stool:401] Intake/Output this shift:    General appearance: alert, cooperative and no distress Resp: clear anterior exam. GI: Wound vac in place, he has stool coming from the ostomy.  Still not having much in the way of bowel sounds. Male genitalia: normal, scrotum red and a bit swollen.  Lab Results:   Recent Labs  11/05/14 0421 11/06/14 0545  WBC 14.0* 12.3*  HGB 12.5* 11.8*  HCT 38.0* 36.9*  PLT 248 385    BMET  Recent Labs  11/05/14 0421 11/06/14 0545  NA 138 134*  K 3.9 3.9  CL 102 100  CO2 25 26  GLUCOSE 204* 303*  BUN 23 30*  CREATININE 1.18 1.11  CALCIUM 8.4 8.2*   PT/INR No results for input(s): LABPROT, INR in the last 72 hours.   Recent Labs Lab 10/31/14 0300 11/04/14 0443  AST 18 23  ALT 16 24  ALKPHOS 36* 62  BILITOT 0.7 0.9  PROT 5.6* 6.3  ALBUMIN 2.6* 2.7*     Lipase      Component Value Date/Time   LIPASE 23 10/27/2014 1810     Studies/Results: Dg Chest Port 1 View  11/06/2014   CLINICAL DATA:  Respiratory failure. Evaluate endotracheal tube positioning  EXAM: PORTABLE CHEST - 1 VIEW  COMPARISON:  11/05/2014; 11/04/2014; 11/03/2014  FINDINGS: Grossly unchanged enlarged cardiac silhouette and mediastinal contours given persistently reduced lung volumes. Minimal retraction of left upper extremity approach PICC line with tip now projected over the mid SVC. Pulmonary vasculature is indistinct with cephalization of flow. Interval development of small bilateral effusions and associated worsening bibasilar heterogeneous/consolidative opacities. No pneumothorax. Unchanged bones.  IMPRESSION: 1. Minimal retraction of left upper extremity approach PICC line with tip now projected over the mid SVC. Otherwise, stable position of support apparatus. No pneumothorax. 2. Findings compatible with worsening pulmonary edema, now was small effusions and associated worsening bibasilar heterogeneous/consolidative opacities, atelectasis versus infiltrate.   Electronically Signed   By: Simonne Come M.D.   On: 11/06/2014 07:18   Dg Chest Port 1 View  11/05/2014   CLINICAL DATA:  Respiratory failure with hypoxia  EXAM: PORTABLE CHEST - 1 VIEW  COMPARISON:  11/04/2014  FINDINGS: Endotracheal tube remains in good position. NG tube enters the stomach. Right arm PICC has been removed.  Interval placement of  left arm PICC with the tip in the SVC in good position.  Progression of right lower lobe atelectasis. Left lower lobe atelectasis shows mild interval improvement. Negative for heart failure.  IMPRESSION: Left arm PICC tip in the lower SVC in good position.  Progression of right lower lobe atelectasis. Mild improvement in left lower lobe atelectasis.   Electronically Signed   By: Charles  ClarMarlan Palauk M.D.   On: 11/05/2014 07:09    Medications: . antiseptic oral rinse  7 mL Mouth Rinse QID  .  chlorhexidine  15 mL Mouth Rinse BID  . feeding supplement (VITAL HIGH PROTEIN)  1,000 mL Per Tube Q24H  . furosemide  40 mg Intravenous Q8H  . heparin  5,000 Units Subcutaneous 3 times per day  . insulin aspart  0-15 Units Subcutaneous 6 times per day  . levothyroxine  75 mcg Intravenous Daily  . lip balm  1 application Topical BID  . pantoprazole (PROTONIX) IV  40 mg Intravenous Q24H  . sodium chloride  10-40 mL Intracatheter Q12H    Assessment/Plan 1. Sigmoid diverticulitis, Perforated Bowel EXPLORATORY LAPAROTOMY, PARTIAL COLECTOMY Sigmoid, COLOSTOMY,  Repair umbilical hernia, 10/28/2014, Chevis PrettyPaul Toth III, MD.  2. Sepsis with hypotension 3.Ventilator dependant respiratory failure/EXTUBATED/RE intubated 1/16?16  4. Hx of hypertension, now requiring pressors 5. Hypothyroid TSH 16 6. Hx of tobacco use 30 years (quit 9 years ago) 7. Body mass index is 38.51 kg  8. Hx of nephrolithiasis 9. Heparin for DVT prophylaxis 10. Malnutrition/TNA, going to see how he does with some TF. Prealbumin 15.9   Plan:  Increase TF to 15 ml per hour today, watch ostomy output.  He is more comfortable in larger bed but still fairly agitated, on Vent  Add some nystatin powder for scrotum.  I will look at the wounds later today with dressing change.    LOS: 10 days    Korver,Tanesia Butner 11/06/2014

## 2014-11-07 ENCOUNTER — Inpatient Hospital Stay (HOSPITAL_COMMUNITY): Payer: 59

## 2014-11-07 DIAGNOSIS — R21 Rash and other nonspecific skin eruption: Secondary | ICD-10-CM

## 2014-11-07 DIAGNOSIS — E46 Unspecified protein-calorie malnutrition: Secondary | ICD-10-CM

## 2014-11-07 DIAGNOSIS — J810 Acute pulmonary edema: Secondary | ICD-10-CM

## 2014-11-07 LAB — CBC WITH DIFFERENTIAL/PLATELET
BASOS ABS: 0 10*3/uL (ref 0.0–0.1)
Basophils Relative: 0 % (ref 0–1)
EOS PCT: 2 % (ref 0–5)
Eosinophils Absolute: 0.3 10*3/uL (ref 0.0–0.7)
HCT: 38.7 % — ABNORMAL LOW (ref 39.0–52.0)
Hemoglobin: 12.6 g/dL — ABNORMAL LOW (ref 13.0–17.0)
LYMPHS ABS: 1.9 10*3/uL (ref 0.7–4.0)
Lymphocytes Relative: 14 % (ref 12–46)
MCH: 31.7 pg (ref 26.0–34.0)
MCHC: 32.6 g/dL (ref 30.0–36.0)
MCV: 97.2 fL (ref 78.0–100.0)
MONO ABS: 1.1 10*3/uL — AB (ref 0.1–1.0)
Monocytes Relative: 8 % (ref 3–12)
NEUTROS ABS: 10.1 10*3/uL — AB (ref 1.7–7.7)
Neutrophils Relative %: 76 % (ref 43–77)
Platelets: 436 10*3/uL — ABNORMAL HIGH (ref 150–400)
RBC: 3.98 MIL/uL — ABNORMAL LOW (ref 4.22–5.81)
RDW: 14.1 % (ref 11.5–15.5)
WBC: 13.5 10*3/uL — ABNORMAL HIGH (ref 4.0–10.5)

## 2014-11-07 LAB — COMPREHENSIVE METABOLIC PANEL
ALBUMIN: 2.9 g/dL — AB (ref 3.5–5.2)
ALK PHOS: 78 U/L (ref 39–117)
ALT: 34 U/L (ref 0–53)
AST: 21 U/L (ref 0–37)
Anion gap: 6 (ref 5–15)
BUN: 31 mg/dL — AB (ref 6–23)
CO2: 27 mmol/L (ref 19–32)
Calcium: 8.2 mg/dL — ABNORMAL LOW (ref 8.4–10.5)
Chloride: 101 mEq/L (ref 96–112)
Creatinine, Ser: 1.05 mg/dL (ref 0.50–1.35)
GFR calc non Af Amer: 75 mL/min — ABNORMAL LOW (ref >90)
GFR, EST AFRICAN AMERICAN: 87 mL/min — AB (ref >90)
Glucose, Bld: 253 mg/dL — ABNORMAL HIGH (ref 70–99)
Potassium: 4.1 mmol/L (ref 3.5–5.1)
Sodium: 134 mmol/L — ABNORMAL LOW (ref 135–145)
TOTAL PROTEIN: 6.3 g/dL (ref 6.0–8.3)
Total Bilirubin: 0.7 mg/dL (ref 0.3–1.2)

## 2014-11-07 LAB — TRIGLYCERIDES: Triglycerides: 211 mg/dL — ABNORMAL HIGH (ref <150)

## 2014-11-07 LAB — GLUCOSE, CAPILLARY
GLUCOSE-CAPILLARY: 231 mg/dL — AB (ref 70–99)
GLUCOSE-CAPILLARY: 222 mg/dL — AB (ref 70–99)
Glucose-Capillary: 144 mg/dL — ABNORMAL HIGH (ref 70–99)
Glucose-Capillary: 171 mg/dL — ABNORMAL HIGH (ref 70–99)
Glucose-Capillary: 200 mg/dL — ABNORMAL HIGH (ref 70–99)
Glucose-Capillary: 208 mg/dL — ABNORMAL HIGH (ref 70–99)
Glucose-Capillary: 225 mg/dL — ABNORMAL HIGH (ref 70–99)

## 2014-11-07 LAB — MAGNESIUM: MAGNESIUM: 2.2 mg/dL (ref 1.5–2.5)

## 2014-11-07 LAB — PHOSPHORUS: PHOSPHORUS: 3.5 mg/dL (ref 2.3–4.6)

## 2014-11-07 MED ORDER — ETHACRYNIC ACID 25 MG PO TABS
50.0000 mg | ORAL_TABLET | Freq: Two times a day (BID) | ORAL | Status: DC
  Administered 2014-11-07 (×2): 50 mg via ORAL
  Filled 2014-11-07 (×5): qty 2

## 2014-11-07 MED ORDER — TRACE MINERALS CR-CU-F-FE-I-MN-MO-SE-ZN IV SOLN
INTRAVENOUS | Status: AC
  Administered 2014-11-07: 21:00:00 via INTRAVENOUS
  Filled 2014-11-07: qty 960

## 2014-11-07 MED ORDER — PRO-STAT SUGAR FREE PO LIQD
30.0000 mL | Freq: Two times a day (BID) | ORAL | Status: DC
  Administered 2014-11-07 – 2014-11-10 (×7): 30 mL via ORAL
  Filled 2014-11-07 (×12): qty 30

## 2014-11-07 MED ORDER — INSULIN GLARGINE 100 UNIT/ML ~~LOC~~ SOLN
15.0000 [IU] | Freq: Every day | SUBCUTANEOUS | Status: DC
  Administered 2014-11-07 – 2014-11-21 (×15): 15 [IU] via SUBCUTANEOUS
  Filled 2014-11-07 (×15): qty 0.15

## 2014-11-07 MED ORDER — DIPHENHYDRAMINE HCL 12.5 MG/5ML PO ELIX
12.5000 mg | ORAL_SOLUTION | Freq: Once | ORAL | Status: AC
  Administered 2014-11-07: 12.5 mg
  Filled 2014-11-07: qty 5

## 2014-11-07 MED ORDER — GUAIFENESIN 100 MG/5ML PO SOLN
10.0000 mL | Freq: Two times a day (BID) | ORAL | Status: DC
  Administered 2014-11-07 – 2014-11-17 (×22): 200 mg
  Filled 2014-11-07 (×12): qty 10
  Filled 2014-11-07 (×4): qty 20
  Filled 2014-11-07 (×5): qty 10

## 2014-11-07 MED ORDER — SODIUM CHLORIDE 0.9 % IV SOLN
25.0000 ug/h | INTRAVENOUS | Status: DC
  Administered 2014-11-07: 25 ug/h via INTRAVENOUS
  Administered 2014-11-08: 300 ug/h via INTRAVENOUS
  Administered 2014-11-08: 125 ug/h via INTRAVENOUS
  Administered 2014-11-09 (×2): 300 ug/h via INTRAVENOUS
  Administered 2014-11-10: 250 ug/h via INTRAVENOUS
  Administered 2014-11-10: 200 ug/h via INTRAVENOUS
  Administered 2014-11-11 (×2): 150 ug/h via INTRAVENOUS
  Administered 2014-11-11 – 2014-11-12 (×2): 200 ug/h via INTRAVENOUS
  Filled 2014-11-07 (×11): qty 50

## 2014-11-07 NOTE — Progress Notes (Signed)
PULMONARY / CRITICAL CARE MEDICINE   Name: William [name] MRN: [id number] DOB: October 31, 1950    ADMISSION DATE:  10/27/2014  CHIEF COMPLAINT:  Perforated sigmoid colon  BRIEF PATIENT DESCRIPTION:   64 y/o male former smoker presented with abd pain from perforated sigmoid colon taken to OR emergently.  Remained on vent post op.  Failed extubation 1/16, reintubated.    SIGNIFICANT EVENTS:  1/10  Admit, Partial colectomy  1/13  Off pressors 1/14  On TNA 1/16  Extubated >> reintubated in PM 1/19  Agitation, hypertension overnight, sedation changed to propofol. 60% / 8 1/20 PEEP weaned to 5, FiO2 40%, diuresing  STUDIES: 1/10  CT abd/pelvis >> sigmoid colon diverticulitis with perforation, umbilical hernia, b/l renal stones, 8 mm RLL nodule 1/11  ECHO >> EF 55-60%, nml systolic fxn, PA peak 32   1/17  CT Head >> no acute intracranial abnormality, ? Small vessel ischemic changes, L maxillary sinus disease  SUBJECTIVE:  Tolerated PSV 10/5 for about 4 hours yesterday,   VITAL SIGNS: Temp:  [98.4 F (36.9 C)-100.3 F (37.9 C)] 98.4 F (36.9 C) (01/21 0800) Pulse Rate:  [84-91] 90 (01/21 0418) Resp:  [15-28] 22 (01/21 0800) BP: (99-146)/(52-95) 129/68 mmHg (01/21 0800) SpO2:  [87 %-99 %] 93 % (01/21 0800) FiO2 (%):  [40 %-50 %] 45 % (01/21 0818) Weight:  [278 lb 14.1 oz (126.5 kg)] 278 lb 14.1 oz (126.5 kg) (01/21 0400)   VENTILATOR SETTINGS: Vent Mode:  [-] PRVC FiO2 (%):  [40 %-50 %] 45 % Set Rate:  [18 bmp] 18 bmp Vt Set:  [610 mL] 610 mL PEEP:  [5 cmH20] 5 cmH20 Pressure Support:  [10 cmH20] 10 cmH20 Plateau Pressure:  [16 cmH20-21 cmH20] 21 cmH20   INTAKE / OUTPUT: Intake/Output      01/20 0701 - 01/21 0700 01/21 0701 - 01/22 0700   I.V. (mL/kg) 1174.1 (9.3) 44.4 (0.4)   NG/GT 552.3 150   TPN 2235.8 70   Total Intake(mL/kg) 3962.1 (31.3) 264.4 (2.1)   Urine (mL/kg/hr) 4905 (1.6) 250 (0.9)   Drains 100 (0)    Stool 175 (0.1)    Total Output 5180 250   Net -1217.9  +14.4          PHYSICAL EXAMINATION:  Gen: sedated on vent HEENT: NCAT EOMi, ETT PULM: few rhonchi bases CV: tachy, no mgr AB: infrequent bowel sounds, midline scar Ext: SCD Neuro: sedated on vent, maew  LABS:  CBC  Recent Labs Lab 11/05/14 0421 11/06/14 0545 11/07/14 0350  WBC 14.0* 12.3* 13.5*  HGB 12.5* 11.8* 12.6*  HCT 38.0* 36.9* 38.7*  PLT 248 385 436*   BMET  Recent Labs Lab 11/05/14 0421 11/06/14 0545 11/07/14 0350  NA 138 134* 134*  K 3.9 3.9 4.1  CL 102 100 101  CO2 BUN 23 30* 31*  CREATININE 1.18 1.11 1.05  GLUCOSE 204* 303* 253*   Electrolytes  Recent Labs Lab 11/03/14 0443 11/04/14 0443 11/05/14 0421 11/06/14 0545 11/07/14 0350  CALCIUM 8.5 8.4 8.4 8.2* 8.2*  MG 2.2 2.1  --   --  2.2  PHOS 3.4 3.1  --   --  3.5   Sepsis Markers  Recent Labs Lab 11/02/14 0439 11/05/14 0420  LATICACIDVEN 1.1 1.3   ABG  Recent Labs Lab 11/03/14 0250  PHART 7.443  PCO2ART 45.2*  PO2ART 86.6   Liver Enzymes  Recent Labs Lab 11/04/14 0443 11/07/14 0350  AST 23 21  ALT 24 34  ALKPHOS 62 78  BILITOT 0.9 0.7  ALBUMIN 2.7* 2.9*   Glucose  Recent Labs Lab 11/06/14 0020 11/06/14 0347 11/06/14 0759 11/06/14 1116 11/06/14 1531 11/06/14 1928  GLUCAP 234* 296* 292* 311* 275* 259*    Imaging Dg Chest Port 1 View  11/07/2014   CLINICAL DATA:  Respiratory failure.  Hypoxia.  EXAM: PORTABLE CHEST - 1 VIEW  COMPARISON:  11/06/2014  FINDINGS: There is an endotracheal tube with tip at the clavicular heads. Gastric suction tube reaches the stomach. Stable positioning of left upper extremity PICC, tip at the SVC level.  Unchanged cardiomegaly. Aortic and hilar contours are stable. Hazy density of the lower chest is increased in density and extent. There is pulmonary venous congestion best visualized in the upper lungs. No pneumothorax.  IMPRESSION: Worsening basilar aeration which could reflect progressive pneumonia or atelectasis.  There is pulmonary venous congestion and pleural effusions.   Electronically Signed   By: Tiburcio PeaJonathan  Watts M.D.   On: 11/07/2014 06:39   Dg Chest Port 1 View  11/06/2014   CLINICAL DATA:  Respiratory failure. Evaluate endotracheal tube positioning  EXAM: PORTABLE CHEST - 1 VIEW  COMPARISON:  11/05/2014; 11/04/2014; 11/03/2014  FINDINGS: Grossly unchanged enlarged cardiac silhouette and mediastinal contours given persistently reduced lung volumes. Minimal retraction of left upper extremity approach PICC line with tip now projected over the mid SVC. Pulmonary vasculature is indistinct with cephalization of flow. Interval development of small bilateral effusions and associated worsening bibasilar heterogeneous/consolidative opacities. No pneumothorax. Unchanged bones.  IMPRESSION: 1. Minimal retraction of left upper extremity approach PICC line with tip now projected over the mid SVC. Otherwise, stable position of support apparatus. No pneumothorax. 2. Findings compatible with worsening pulmonary edema, now was small effusions and associated worsening bibasilar heterogeneous/consolidative opacities, atelectasis versus infiltrate.   Electronically Signed   By: Simonne ComeJohn  Watts M.D.   On: 11/06/2014 07:18    ASSESSMENT / PLAN:  PULMONARY ETT 1/10 >> 1/16, 1/16 >> A: Acute respiratory failure due to atelectasis, pulm edema > improving but CXR worse, doesn't appear to have pneumonia Hx of tobacco abuse with 8 mm RLL nodule on CT abd/pelvis Hx of OSA P:   Wean FiO2 / PEEP to keep SaO2 > 90% Pressure support wean as tolerated  Wife OK with tracheostomy, plan 1/22.  D12 intubation.  Continue bag lavage PRN thick secretions, add guaifenesin  CARDIOVASCULAR L femoral CVC 1/11 >> 1/18 RUE PICC 1/13 >> 1/18 (coiled) LUE PICC 1/18 >>  A: Septic shock 2nd to peritonitis >> resolved Demand ischemia > resolved HTN P:   Monitor hemodynamics Hold outpatient lisinopril Continue metoprolol 25mg  bid Diurese again  1/21 > change to ethracrynic acid 50mg  bid (rash, lasix related?)  RENAL A: Lactic Acidosis  - resolved Hypervolemia Hypokalemia P:   Monitor renal fx, urine outpt, electrolytes Daily KCL replacement  GASTROINTESTINAL A: Perforated Sigmoid s/p Partial Colectomy (1/10) Nutrition P:   Post-op care, nutrition per [facility] Protonix for SUP TNA / Lipids per Pharmacy   HEMATOLOGIC A: Anemia of critical illness P: F/u CBC SQ heparin for DVT prevention  INFECTIOUS A: Septic shock 2nd to peritonitis from perforated sigmoid colon > resolved Left Maxillary Sinus Disease  P:   7 days zosyn, micafungin > d/c'd 1/18  Blood 1/11 >> neg Monitor fever curve / leukocytosis  ENDOCRINE A: Hyperglycemia Hx of hypothyroidism P:   SSI  Continue synthroid IV  NEUROLOGIC A: Post-op pain control Agitation - CT head 1/17 neg for acute abnormality P:  RASS Goal: 0 Supportive care:  Minimize sedation as able, promote sleep wake cycle, mobilize etc  Propofol for sedation  PRN fentanyl for pain   Family: Wife updated by phone 1/21  Today's Summary : Slow weaning, vent day 12, plan trach 1/22  CC time 35 minutes  [place], MD [place] [facility] Pager: [phone] Cell: [phone] If no response, call [phone]

## 2014-11-07 NOTE — Progress Notes (Signed)
10 Days Post-Op  Subjective: He is still sedated on the vent, but he is still very active.  He has stool coming from his ostomy.  Tube feeds are still at 64ml/h.  Weaning slowly.   Objective: Vital signs in last 24 hours: Temp:  [98.4 F (36.9 C)-100.3 F (37.9 C)] 98.4 F (36.9 C) (01/21 0800) Pulse Rate:  [84-91] 90 (01/21 0418) Resp:  [15-28] 22 (01/21 0800) BP: (99-146)/(52-95) 129/68 mmHg (01/21 0800) SpO2:  [87 %-99 %] 93 % (01/21 0800) FiO2 (%):  [40 %-50 %] 45 % (01/21 0818) Weight:  [278 lb 14.1 oz (126.5 kg)] 278 lb 14.1 oz (126.5 kg) (01/21 0400) Last BM Date: 11/07/14  . Marland KitchenTPN (CLINIMIX-E) Adult 70 mL/hr at 11/06/14 2000  . sodium chloride 10 mL/hr at 11/04/14 2305  . propofol 30 mcg/kg/min (11/07/14 0730)   Intake/Output from previous day: 01/20 0701 - 01/21 0700 In: 3962.1 [I.V.:1174.1; NG/GT:552.3; TPN:2235.8] Out: 5180 [Urine:4905; Drains:100; Stool:175] Intake/Output this shift: Total I/O In: 264.4 [I.V.:44.4; NG/GT:150; TPN:70] Out: 250 [Urine:250]  General appearance: alert, cooperative and no distress Resp: clear anterior exam. GI: Wound vac in place, he has stool coming from the ostomy.     Lab Results:   Recent Labs  11/06/14 0545 11/07/14 0350  WBC 12.3* 13.5*  HGB 11.8* 12.6*  HCT 36.9* 38.7*  PLT 385 436*    BMET  Recent Labs  11/06/14 0545 11/07/14 0350  NA 134* 134*  K 3.9 4.1  CL 100 101  CO2 26 27  GLUCOSE 303* 253*  BUN 30* 31*  CREATININE 1.11 1.05  CALCIUM 8.2* 8.2*   PT/INR No results for input(s): LABPROT, INR in the last 72 hours.   Recent Labs Lab 11/04/14 0443 11/07/14 0350  AST 23 21  ALT 24 34  ALKPHOS 62 78  BILITOT 0.9 0.7  PROT 6.3 6.3  ALBUMIN 2.7* 2.9*     Lipase     Component Value Date/Time   LIPASE 23 10/27/2014 1810     Studies/Results: Dg Chest Port 1 View  11/07/2014   CLINICAL DATA:  Respiratory failure.  Hypoxia.  EXAM: PORTABLE CHEST - 1 VIEW  COMPARISON:  11/06/2014  FINDINGS:  There is an endotracheal tube with tip at the clavicular heads. Gastric suction tube reaches the stomach. Stable positioning of left upper extremity PICC, tip at the SVC level.  Unchanged cardiomegaly. Aortic and hilar contours are stable. Hazy density of the lower chest is increased in density and extent. There is pulmonary venous congestion best visualized in the upper lungs. No pneumothorax.  IMPRESSION: Worsening basilar aeration which could reflect progressive pneumonia or atelectasis. There is pulmonary venous congestion and pleural effusions.   Electronically Signed   By: Tiburcio Pea M.D.   On: 11/07/2014 06:39   Dg Chest Port 1 View  11/06/2014   CLINICAL DATA:  Respiratory failure. Evaluate endotracheal tube positioning  EXAM: PORTABLE CHEST - 1 VIEW  COMPARISON:  11/05/2014; 11/04/2014; 11/03/2014  FINDINGS: Grossly unchanged enlarged cardiac silhouette and mediastinal contours given persistently reduced lung volumes. Minimal retraction of left upper extremity approach PICC line with tip now projected over the mid SVC. Pulmonary vasculature is indistinct with cephalization of flow. Interval development of small bilateral effusions and associated worsening bibasilar heterogeneous/consolidative opacities. No pneumothorax. Unchanged bones.  IMPRESSION: 1. Minimal retraction of left upper extremity approach PICC line with tip now projected over the mid SVC. Otherwise, stable position of support apparatus. No pneumothorax. 2. Findings compatible with worsening pulmonary edema,  now was small effusions and associated worsening bibasilar heterogeneous/consolidative opacities, atelectasis versus infiltrate.   Electronically Signed   By: Simonne ComeJohn  Watts M.D.   On: 11/06/2014 07:18    Medications: . antiseptic oral rinse  7 mL Mouth Rinse QID  . chlorhexidine  15 mL Mouth Rinse BID  . ethacrynic acid  50 mg Oral BID  . feeding supplement (PRO-STAT SUGAR FREE 64)  30 mL Oral TID WC  . feeding supplement  (VITAL HIGH PROTEIN)  1,000 mL Per Tube Q24H  . guaiFENesin  10 mL Per Tube BID  . heparin  5,000 Units Subcutaneous 3 times per day  . insulin aspart  0-20 Units Subcutaneous 6 times per day  . levothyroxine  75 mcg Intravenous Daily  . lip balm  1 application Topical BID  . metoprolol tartrate  25 mg Per Tube BID  . nystatin   Topical BID  . pantoprazole (PROTONIX) IV  40 mg Intravenous Q24H  . potassium chloride  40 mEq Oral BID  . sodium chloride  10-40 mL Intracatheter Q12H    Assessment/Plan 1. Sigmoid diverticulitis, Perforated Bowel EXPLORATORY LAPAROTOMY, PARTIAL COLECTOMY Sigmoid, COLOSTOMY,  Repair umbilical hernia, 10/28/2014, Chevis PrettyPaul Toth III, MD.  2. Sepsis with hypotension 3.Ventilator dependant respiratory failure/EXTUBATED/RE intubated 11/02/14  4. Hx of hypertension, now requiring pressors 5. Hypothyroid TSH 16 6. Hx of tobacco use 30 years (quit 9 years ago) 7. Body mass index is 38.51 kg  8. Hx of nephrolithiasis 9. Heparin for DVT prophylaxis 10. Malnutrition/TNA, going to see how he does with some TF. Prealbumin 15.9   Plan:  Increase TF to 25 ml per hour and advance to goal q4h, watch ostomy output. Once TF's at goal, can d/c TPN.  On Vent.  Slowly progressing.  Discussed with CCM.  They are planning perc trach.    LOS: 11 days    Miller [place] C. 11/07/2014

## 2014-11-07 NOTE — Consult Note (Signed)
Reason for Consult:Respiratory failure Referring Physician: Dr. France Ravens William [name] is an 64 y.o. male.  HPI: 64 year old male with complicated course related to bowel perforation due to diverticulitis.  Underwent exploratory laparotomy and partial colectomy on 1/11 and required resuscitation and treatment of sepsis including mechanical ventilation.  He was extubated 1/16 but had to be reintubated.  Now, with continued need for mechanical ventilation, tracheostomy is requested.  Past Medical History  Diagnosis Date  . Renal disorder     kidney stones  . Hypertension   . Thyroid disease     Past Surgical History  Procedure Laterality Date  . Partial colectomy N/A 10/28/2014    Procedure: PARTIAL COLECTOMY Sigmoid;  Surgeon: Autumn Messing III, MD;  Location: [facility];  Service: General;  Laterality: N/A;  . Colostomy N/A 10/28/2014    Procedure: COLOSTOMY;  Surgeon: Autumn Messing III, MD;  Location: [facility];  Service: General;  Laterality: N/A;  . Laparotomy N/A 10/28/2014    Procedure: EXPLORATORY LAPAROTOMY;  Surgeon: Autumn Messing III, MD;  Location: [facility];  Service: General;  Laterality: N/A;    History reviewed. No pertinent family history.  Social History:  reports that he has quit smoking. He does not have any smokeless tobacco history on file. His alcohol and drug histories are not on file.  Allergies:  Allergies  Allergen Reactions  . Aloprim [Allopurinol] Other (See Comments)    Unknown reaction  . Simvastatin Other (See Comments)    Unknown reaction    Medications: I have reviewed the patient's current medications.  Results for orders placed or performed during the hospital encounter of 10/27/14 (from the past 48 hour(s))  Glucose, capillary     Status: Abnormal   Collection Time: 11/05/14  7:54 PM  Result Value Ref Range   Glucose-Capillary 233 (H) 70 - 99 mg/dL  Glucose, capillary     Status: Abnormal   Collection Time: 11/06/14 12:20 AM  Result Value Ref Range    Glucose-Capillary 234 (H) 70 - 99 mg/dL   Comment 1 Documented in Chart    Comment 2 Notify RN   Glucose, capillary     Status: Abnormal   Collection Time: 11/06/14  3:47 AM  Result Value Ref Range   Glucose-Capillary 296 (H) 70 - 99 mg/dL   Comment 1 Notify RN    Comment 2 Documented in Chart   CBC     Status: Abnormal   Collection Time: 11/06/14  5:45 AM  Result Value Ref Range   WBC 12.3 (H) 4.0 - 10.5 K/uL   RBC 3.79 (L) 4.22 - 5.81 MIL/uL   Hemoglobin 11.8 (L) 13.0 - 17.0 g/dL   HCT 36.9 (L) 39.0 - 52.0 %   MCV 97.4 78.0 - 100.0 fL   MCH 31.1 26.0 - 34.0 pg   MCHC 32.0 30.0 - 36.0 g/dL   RDW 14.6 11.5 - 15.5 %   Platelets 385 150 - 400 K/uL    Comment: REPEATED TO VERIFY DELTA CHECK NOTED   Basic metabolic panel     Status: Abnormal   Collection Time: 11/06/14  5:45 AM  Result Value Ref Range   Sodium 134 (L) 135 - 145 mmol/L    Comment: Please note change in reference range.   Potassium 3.9 3.5 - 5.1 mmol/L    Comment: Please note change in reference range.   Chloride 100 96 - 112 mEq/L   CO2 26 19 - 32 mmol/L   Glucose, Bld 303 (H)  70 - 99 mg/dL   BUN 30 (H) 6 - 23 mg/dL   Creatinine, Ser 1.11 0.50 - 1.35 mg/dL   Calcium 8.2 (L) 8.4 - 10.5 mg/dL   GFR calc non Af Amer 70 (L) >90 mL/min   GFR calc Af Amer 81 (L) >90 mL/min    Comment: (NOTE) The eGFR has been calculated using the CKD EPI equation. This calculation has not been validated in all clinical situations. eGFR's persistently <90 mL/min signify possible Chronic Kidney Disease.    Anion gap 8 5 - 15  Glucose, capillary     Status: Abnormal   Collection Time: 11/06/14  7:59 AM  Result Value Ref Range   Glucose-Capillary 292 (H) 70 - 99 mg/dL   Comment 1 Documented in Chart    Comment 2 Notify RN   Glucose, capillary     Status: Abnormal   Collection Time: 11/06/14 11:16 AM  Result Value Ref Range   Glucose-Capillary 311 (H) 70 - 99 mg/dL   Comment 1 Documented in Chart    Comment 2 Notify RN    Glucose, capillary     Status: Abnormal   Collection Time: 11/06/14  3:31 PM  Result Value Ref Range   Glucose-Capillary 275 (H) 70 - 99 mg/dL   Comment 1 Documented in Chart    Comment 2 Notify RN   Glucose, capillary     Status: Abnormal   Collection Time: 11/06/14  7:28 PM  Result Value Ref Range   Glucose-Capillary 259 (H) 70 - 99 mg/dL  Glucose, capillary     Status: Abnormal   Collection Time: 11/06/14 11:01 PM  Result Value Ref Range   Glucose-Capillary 225 (H) 70 - 99 mg/dL  Comprehensive metabolic panel     Status: Abnormal   Collection Time: 11/07/14  3:50 AM  Result Value Ref Range   Sodium 134 (L) 135 - 145 mmol/L    Comment: Please note change in reference range.   Potassium 4.1 3.5 - 5.1 mmol/L    Comment: Please note change in reference range.   Chloride 101 96 - 112 mEq/L   CO2 27 19 - 32 mmol/L   Glucose, Bld 253 (H) 70 - 99 mg/dL   BUN 31 (H) 6 - 23 mg/dL   Creatinine, Ser 1.05 0.50 - 1.35 mg/dL   Calcium 8.2 (L) 8.4 - 10.5 mg/dL   Total Protein 6.3 6.0 - 8.3 g/dL   Albumin 2.9 (L) 3.5 - 5.2 g/dL   AST 21 0 - 37 U/L   ALT 34 0 - 53 U/L   Alkaline Phosphatase 78 39 - 117 U/L   Total Bilirubin 0.7 0.3 - 1.2 mg/dL   GFR calc non Af Amer 75 (L) >90 mL/min   GFR calc Af Amer 87 (L) >90 mL/min    Comment: (NOTE) The eGFR has been calculated using the CKD EPI equation. This calculation has not been validated in all clinical situations. eGFR's persistently <90 mL/min signify possible Chronic Kidney Disease.    Anion gap 6 5 - 15  Magnesium     Status: None   Collection Time: 11/07/14  3:50 AM  Result Value Ref Range   Magnesium 2.2 1.5 - 2.5 mg/dL  Phosphorus     Status: None   Collection Time: 11/07/14  3:50 AM  Result Value Ref Range   Phosphorus 3.5 2.3 - 4.6 mg/dL  Triglycerides     Status: Abnormal   Collection Time: 11/07/14  3:50 AM  Result Value  Ref Range   Triglycerides 211 (H) <150 mg/dL    Comment: Performed at [facility]  CBC  with Differential     Status: Abnormal   Collection Time: 11/07/14  3:50 AM  Result Value Ref Range   WBC 13.5 (H) 4.0 - 10.5 K/uL   RBC 3.98 (L) 4.22 - 5.81 MIL/uL   Hemoglobin 12.6 (L) 13.0 - 17.0 g/dL   HCT 38.7 (L) 39.0 - 52.0 %   MCV 97.2 78.0 - 100.0 fL   MCH 31.7 26.0 - 34.0 pg   MCHC 32.6 30.0 - 36.0 g/dL   RDW 14.1 11.5 - 15.5 %   Platelets 436 (H) 150 - 400 K/uL   Neutrophils Relative % 76 43 - 77 %   Neutro Abs 10.1 (H) 1.7 - 7.7 K/uL   Lymphocytes Relative 14 12 - 46 %   Lymphs Abs 1.9 0.7 - 4.0 K/uL   Monocytes Relative 8 3 - 12 %   Monocytes Absolute 1.1 (H) 0.1 - 1.0 K/uL   Eosinophils Relative 2 0 - 5 %   Eosinophils Absolute 0.3 0.0 - 0.7 K/uL   Basophils Relative 0 0 - 1 %   Basophils Absolute 0.0 0.0 - 0.1 K/uL  Glucose, capillary     Status: Abnormal   Collection Time: 11/07/14  3:53 AM  Result Value Ref Range   Glucose-Capillary 231 (H) 70 - 99 mg/dL   Comment 1 Documented in Chart    Comment 2 Notify RN   Glucose, capillary     Status: Abnormal   Collection Time: 11/07/14  7:51 AM  Result Value Ref Range   Glucose-Capillary 200 (H) 70 - 99 mg/dL   Comment 1 Documented in Chart    Comment 2 Notify RN   Glucose, capillary     Status: Abnormal   Collection Time: 11/07/14 12:21 PM  Result Value Ref Range   Glucose-Capillary 208 (H) 70 - 99 mg/dL   Comment 1 Documented in Chart    Comment 2 Notify RN   Glucose, capillary     Status: Abnormal   Collection Time: 11/07/14  4:38 PM  Result Value Ref Range   Glucose-Capillary 222 (H) 70 - 99 mg/dL   Comment 1 Documented in Chart    Comment 2 Notify RN     Dg Chest Port 1 View  11/07/2014   CLINICAL DATA:  Subsequent evaluation of acute respiratory failure with hypoxia  EXAM: PORTABLE CHEST - 1 VIEW  COMPARISON:  11/07/2014 at 5:04 a.m.  FINDINGS: No change in the position of orogastric or endotracheal tubes. No change in the appearance of moderate cardiac enlargement. Stable moderate to severe vascular  congestion. Consolidation right lower lobe appears improved and at least partially due to atelectasis. More extensive consolidation in the left lower lobe with air bronchograms, stable.  IMPRESSION: 1. Stable cardiac enlargement and pulmonary venous congestion 2. Persistent but improved right lower lobe opacity suggesting atelectasis 3. Stable left lower lobe opacity representing atelectasis or pneumonia.   Electronically Signed   By: Skipper Cliche M.D.   On: 11/07/2014 10:55   Dg Chest Port 1 View  11/07/2014   CLINICAL DATA:  Respiratory failure.  Hypoxia.  EXAM: PORTABLE CHEST - 1 VIEW  COMPARISON:  11/06/2014  FINDINGS: There is an endotracheal tube with tip at the clavicular heads. Gastric suction tube reaches the stomach. Stable positioning of left upper extremity PICC, tip at the SVC level.  Unchanged cardiomegaly. Aortic and hilar contours are stable.  Hazy density of the lower chest is increased in density and extent. There is pulmonary venous congestion best visualized in the upper lungs. No pneumothorax.  IMPRESSION: Worsening basilar aeration which could reflect progressive pneumonia or atelectasis. There is pulmonary venous congestion and pleural effusions.   Electronically Signed   By: Jorje Guild M.D.   On: 11/07/2014 06:39   Dg Chest Port 1 View  11/06/2014   CLINICAL DATA:  Respiratory failure. Evaluate endotracheal tube positioning  EXAM: PORTABLE CHEST - 1 VIEW  COMPARISON:  11/05/2014; 11/04/2014; 11/03/2014  FINDINGS: Grossly unchanged enlarged cardiac silhouette and mediastinal contours given persistently reduced lung volumes. Minimal retraction of left upper extremity approach PICC line with tip now projected over the mid SVC. Pulmonary vasculature is indistinct with cephalization of flow. Interval development of small bilateral effusions and associated worsening bibasilar heterogeneous/consolidative opacities. No pneumothorax. Unchanged bones.  IMPRESSION: 1. Minimal retraction of  left upper extremity approach PICC line with tip now projected over the mid SVC. Otherwise, stable position of support apparatus. No pneumothorax. 2. Findings compatible with worsening pulmonary edema, now was small effusions and associated worsening bibasilar heterogeneous/consolidative opacities, atelectasis versus infiltrate.   Electronically Signed   By: Sandi Mariscal M.D.   On: 11/06/2014 07:18    Review of Systems  Unable to perform ROS: intubated   Blood pressure 88/63, pulse 90, temperature 99.3 F (37.4 C), temperature source Axillary, resp. rate 0, height 5' 11.5" (1.816 m), weight 126.5 kg (278 lb 14.1 oz), SpO2 90 %. Physical Exam  Constitutional: He appears well-developed and well-nourished.  Intubated and sedated.  HENT:  Head: Normocephalic and atraumatic.  Right Ear: External ear normal.  Left Ear: External ear normal.  Nose: Nose normal.  Oral exam limited.  Eyes:  Eyes closed  Neck:  Normal neck landmarks.  Larynx low.  No scar.  Cardiovascular: Normal rate.   Respiratory: Effort normal.  On mechanical ventilator  Neurological:  Sedated  Skin: Skin is warm and dry.  Psychiatric:  Sedated    Assessment/Plan: Respiratory failure Plan tracheostomy in OR tomorrow.  Tube feeds to be held at midnight.  I discussed plan with his wife including risks, benefits, and alternatives and she gives consent.  William [name] 11/07/2014, 6:17 PM

## 2014-11-07 NOTE — Progress Notes (Signed)
NUTRITION FOLLOW UP  Intervention:   -TPN per pharmacy- Recommend weaning further to avoid overfeeding. -Diet advancement per MD -Advance Vital HP@ 25 ml/hr via NGT and increase by 10 ml every 4 hours to goal rate of 75 ml/hr.   Tube feeding regimen provides 1800 kcal (73% of needs), 158 grams of protein, and 1505 ml of H2O.  -Adjusted Prostat to 30 ml BID. Once TF at goal rate, recommend D/C Prostat  (protein needs are met with TF regimen) -RD to continue to monitor  Nutrition Dx:   Inadequate oral intake related to inability to eat as evidenced by NPO; ongoing  Goal:   Parenteral/Enteral nutrition to provide 60-70% of estimated calorie needs (22-25 kcals/kg ideal body weight) and 100% of estimated protein needs, based on ASPEN guidelines for hypocaloric, high protein feeding in critically ill obese individuals  Monitor:   TPN weaning, TF tolerance, GI profile, weight trend, vent status/settings, labs  Assessment:   1/11: Pt s/p partial colectomy 10/27/14.  - Pt with bleeding from surgical sites.  - Spoke with pt's wife who reported a recent increase in pt's weight (30 lbs in 2-3 months) due to "stress eating." She says that pt will often eat lunch that she packs for him at work as well as food provided to him. She reports an increase in the consumption of starchy foods such as potato chips and breads.   1/13: -Pt remains intubated and sedated, gradually wean of vent and pressors d/t ARDs and obesity/distension per MD note -RD consulted for initiation of TPN  -Wt increased 13 lbs since 1/11; likely d/t 5.7L fluid resuscitation; currently +800 ml output -WOC following for ostomy care -Monitor triglycerides with propofol, may need to administer TPN lipids 2-3x/week vs every day -CBG elevated > 150 mg/dL, monitor with initiation of TPN -K elevated at 6.1, now WNL -Low Phos, being repleted with [facility]  1/17 -RD consulted for TF initiation and management -1/16 Pt was extubated, but  failed. Pt re-intubated later on 1/16. -Pt was started on Vital HP at 20 ml/hr but that was d/c along with propofol infusion. -Spoke with student RN who is to have RN contact RD regarding plans for TF -Pt is currently receiving Clinimix-E 5/15 at 80 mL/hr (~66% of goal rate). Per pharmacy, TPN will be increased to 95 ml/hr at 1800 today. -Wt is back down to admission weight -CBGs still >150 mg/dL -BUN elevated, Mg/Phos WNL  1/19 -RD consulted to begin trickle feeds of Vital HP @ 10 ml/hr over 24 hour period. -Pt continues to receive TPN per pharmacy: Clinimix-E 5/15 120 ml/hr + 20% fat emulsion at 67m/hr on Monday and Thursdays to provide: 144 g/day protein, avg of 2180 kcal/day. (Goal rate meets 90% of estimated protein needs and 100% of estimated kcal needs) -Pt is now sedated using Propofol contributing 404 fat kcal. -CBGs: 187-220 mg/dL  1/20 -Trickle feed to be advanced to 15 ml/hr per surgery -Per diabetes coordinator, recommends insulin be added to TPN. CBGs have been elevating (182-246 mg/dL) -Propofol infusion has been increased, contributing 808 fat kcal -Ostomy output: 200 ml -Pt continue to receive TPN at goal -Elevated BUN, low Na  1/21 -Pt tolerating TF -Pt's TF has advanced to 25 ml/hr and per surgery, advance by 167mevery 4 hours to goal rate  Pharmacy ordered 30 ml Prostat TID (TF and prostat at current rate provides 900 kcal and 98g of protein) -TPN has been decreased to Clinimix-E 5/15 at 70 mL/hr (provides 1193 kcal, 84  g protein)  -Propofol infusion has decreased to provide 605 fat kcal. -TG still elevated, pharmacy to d/c fat emulsion. -In total, pt is provided 2698 kcal and 182g of protein by Clinimix + TF + Propofol infusions. -CBGs continue to elevate: 259-311 mg/dL  Patient is currently intubated on ventilator support MV: 15.6 L/min Temp (24hrs), Avg:99.2 F (37.3 C), Min:98.4 F (36.9 C), Max:100.3 F (37.9 C)  Propofol: 22.9 ml/hr -provides 605 fat  kcal  Height: Ht Readings from Last 1 Encounters:  10/28/14 5' 11.5" (1.816 m)    Weight Status:   Wt Readings from Last 1 Encounters:  11/07/14 278 lb 14.1 oz (126.5 kg)  11/06/14 275 lb 10/30/14 293 lb 10/27/14 280 lb  Re-estimated needs(1/21):  Kcal: 2630 (1800-2000 kcal = 22-25 kcal/kg IBW) Protein:~160 gram protein Fluid: per MD  Skin:  Surgical incision on abd, +1 generalized edema, +1 perineal edema, +1 RLE, +1 LLE edema  Diet Order: Diet NPO time specified .TPN (CLINIMIX-E) Adult   Intake/Output Summary (Last 24 hours) at 11/07/14 0956 Last data filed at 11/07/14 0800  Gross per 24 hour  Intake 3781.21 ml  Output   5020 ml  Net -1238.79 ml    Last BM: 1/21 -colostomy   Labs:   Recent Labs Lab 11/03/14 0443 11/04/14 0443 11/05/14 0421 11/06/14 0545 11/07/14 0350  NA 139 139 138 134* 134*  K 3.5 3.9 3.9 3.9 4.1  CL 95* 100 102 100 101  CO2 33* 29 25 26 27   BUN 27* 25* 23 30* 31*  CREATININE 1.14 0.94 1.18 1.11 1.05  CALCIUM 8.5 8.4 8.4 8.2* 8.2*  MG 2.2 2.1  --   --  2.2  PHOS 3.4 3.1  --   --  3.5  GLUCOSE 182* 183* 204* 303* 253*    CBG (last 3)   Recent Labs  11/06/14 1116 11/06/14 1531 11/06/14 1928  GLUCAP 311* 275* 259*    Scheduled Meds: . antiseptic oral rinse  7 mL Mouth Rinse QID  . chlorhexidine  15 mL Mouth Rinse BID  . ethacrynic acid  50 mg Oral BID  . feeding supplement (PRO-STAT SUGAR FREE 64)  30 mL Oral TID WC  . feeding supplement (VITAL HIGH PROTEIN)  1,000 mL Per Tube Q24H  . guaiFENesin  10 mL Per Tube BID  . heparin  5,000 Units Subcutaneous 3 times per day  . insulin aspart  0-20 Units Subcutaneous 6 times per day  . levothyroxine  75 mcg Intravenous Daily  . lip balm  1 application Topical BID  . metoprolol tartrate  25 mg Per Tube BID  . nystatin   Topical BID  . pantoprazole (PROTONIX) IV  40 mg Intravenous Q24H  . potassium chloride  40 mEq Oral BID  . sodium chloride  10-40 mL Intracatheter Q12H     Continuous Infusions: . Marland KitchenTPN (CLINIMIX-E) Adult 70 mL/hr at 11/06/14 2000  . sodium chloride 10 mL/hr at 11/04/14 2305  . propofol 30 mcg/kg/min (11/07/14 0730)    Clayton Bibles, MS, RD, LDN Pager: [phone] After Hours Pager: [phone]

## 2014-11-07 NOTE — Progress Notes (Signed)
Inpatient Diabetes Program Recommendations  AACE/ADA: New Consensus Statement on Inpatient Glycemic Control (2013)  Target Ranges:  Prepandial:   less than 140 mg/dL      Peak postprandial:   less than 180 mg/dL (1-2 hours)      Critically ill patients:  140 - 180 mg/dL     Results for William [name], D[place]lon (MRN [id number]) as of 11/07/2014 08:04  Ref. Range 11/06/2014 00:20 11/06/2014 03:47 11/06/2014 07:59 11/06/2014 11:16 11/06/2014 15:31 11/06/2014 19:28  Glucose-Capillary Latest Range: 70-99 mg/dL 409234 (H) 811296 (H) 914292 (H) 311 (H) 275 (H) 259 (H)     Current Orders: Novolog Resistant SSI Q4 hours (increased yesterday AM)   **Patient currently having consistently elevated glucose levels.  **Note TPN has been reduced to a rate of 70 cc/hour.  No insulin in TPN.  **Tube feeds have been slowly titrated upward.  Note order to increase tube feeds to 25 cc/hour today.  Also note RD consult note stating goal of tube feeds will be 75 cc/hr.  **Not sure when TPN will be stopped and tube feeds further advanced.    MD- Please consider adding low dose basal insulin to patient's insulin regimen to help better control glucose levels.    Once tube feeds advanced to goal rate of 75 cc/hr, patient may also require Novolog tube feed coverage to cover the carbohydrate in the continuous tube feeds.  Please consider starting Lantus 18 units daily (0.15 units/kg dosing- based on pt weight of 126 kg)   Will follow Ambrose FinlandJeannine Johnston Aengus Sauceda RN, MSN, CDE Diabetes Coordinator Inpatient Diabetes Program Team Pager: [phone] (8a-10p)

## 2014-11-07 NOTE — Progress Notes (Signed)
PARENTERAL NUTRITION CONSULT NOTE   Pharmacy Consult for TPN Indication: Prolonged ileus  Allergies  Allergen Reactions  . Aloprim [Allopurinol] Other (See Comments)    Unknown reaction  . Simvastatin Other (See Comments)    Unknown reaction    Patient Measurements: Height: 5' 11.5" (181.6 cm) Weight: 278 lb 14.1 oz (126.5 kg) IBW/kg (Calculated) : 76.45 Adjusted Body Weight: 99kg Usual Weight: 114kg  Vital Signs: Temp: 98.4 F (36.9 C) (01/21 0800) Temp Source: Oral (01/21 0800) BP: 129/68 mmHg (01/21 0800) Pulse Rate: 90 (01/21 0418) Intake/Output from previous day: 01/20 0701 - 01/21 0700 In: 3962.1 [I.V.:1174.1; NG/GT:552.3; TPN:2235.8] Out: 5180 [Urine:4905; Drains:100; Stool:175]  Labs:  Recent Labs  11/05/14 0421 11/06/14 0545 11/07/14 0350  WBC 14.0* 12.3* 13.5*  HGB 12.5* 11.8* 12.6*  HCT 38.0* 36.9* 38.7*  PLT 248 385 436*     Recent Labs  11/05/14 0421 11/06/14 0545 11/07/14 0350  NA 138 134* 134*  K 3.9 3.9 4.1  CL 102 100 101  CO2 25 26 27   GLUCOSE 204* 303* 253*  BUN 23 30* 31*  CREATININE 1.18 1.11 1.05  CALCIUM 8.4 8.2* 8.2*  MG  --   --  2.2  PHOS  --   --  3.5  PROT  --   --  6.3  ALBUMIN  --   --  2.9*  AST  --   --  21  ALT  --   --  34  ALKPHOS  --   --  78  BILITOT  --   --  0.7  TRIG  --   --  211*   Estimated Creatinine Clearance: 100.8 mL/min (by C-G formula based on Cr of 1.05).    Recent Labs  11/06/14 1116 11/06/14 1531 11/06/14 1928  GLUCAP 311* 275* 259*   Insulin Requirements in past 24 hours:  55 units of resistant SSI Novolog  Current Nutrition:  Clinimix-E 5/15 at 700 mL/hr  Vital HP @ 15 ml/hr  Prostat TID  Propofol infusion currently at 22.7 ml/hr  Diet: NPO  [facility]  Central access: Left UE PICC (11/04/14) TPN start date: 1/13  ASSESSMENT                                                                                                          HPI: [id number] y/oM with HTN and  nephrolithiasis who [facility] with abdominal pain of 1 day duration secondary to a perforated sigmoid d/t diverticulitis. He was taken emergently to OR on 1/10 and is now s/p partial colectomy. Orders received to start TPN for anticipated prolonged ileus 1/13.  Significant events:  1/13: VDRF post-op remains on propofol (high needs) and pressor support 1/14: Requiring max doses of propofol (7650mcg/kg/min = 38.361ml/hr) 1/15: Propofol at 30 mcg/kg/min (22.9 mL/hr).  Vital HP tube feeding started at fixed rate of 20 mL/hr 1/16: Propofol at 40 mcg/kg/min (30.5 mL/hr).  Extubated (TF and propofol DC), but required reintubation due to agitation.  1/18: Rt UE PICC is coiled - per  MD ok to continue TPN infusion through this line for now.  PICC line replaced in left UE and restarted new TPN at 1800 in new line. 1/19 Proprofol resumed.  No ostomy output, flatus only.  Trial trophic TF @ 10 ml/hr. 1/20 Proprofol infusion remains at ~30 ml/hr.  Rn states no residuals with TF.  Slight increase of TF rate and OK to add prostat.  First stool coming from ostomy; plan to use TF alone when gut function is verified. 1/21 Propofol up to 60 mcg/kg/min overnight, but down to 30 mcg/kg/min now (22.7 ml/hr).  Per MD and RD, titrate TF to goal rate.  Today, 11/07/2014:   Glucose:  CBGs remain elevated.  24 hour range: 311, 275, 259 (CBGs not entered in [facility] for past 12 hours.  RN states CBG 200 this AM and will f/u with entering values).  Electrolytes: WNL except Na low (unable to adjust in premix TPN).  CorrCa 9.08.  KCl supplement per MD order with ongoing diuresis.  Renal: SCr 1.05  I/O: -1.2 L (1/20)  LFTs: all below ULN   TGs: 270 (1/14), 191 (1/18), 211 (1/21)  Prealbumin: 9.7 (1/14), 15.9 (1/18)  NUTRITIONAL GOALS                                                                                              RD recs (updated 1/19)  Needs: 160 g protein/day, 1800-2000 kcal/d  Goal: Parenteral  nutrition to provide 60-70% of estimated calorie needs (22-25 kcals/kg ideal body weight) and 100% of estimated protein needs, based on ASPEN guidelines for hypocaloric, high protein feeding in critically ill obese individuals  Vital HP@ goal rate of 75 ml/hr =  1800 kcal (73% of needs), 158 grams of protein  Clinimix-E 5/15 at a goal rate of 120 ml/hr + 20% fat emulsion at 72ml/hr on Monday and Thursdays to provide: 144 g/day protein, avg of 2180 kcal/day. (Goal rate meets 90% of estimated protein needs and 100% of estimated kcal needs)  Modified plan  Clinimix-E 5/15 at 40 ml/hr (w/o lipids) = 682 kcal, 48 g protein  Propofol as titrated  Tube feeds and prostat supplement per MD/RD  PLAN                                                                                                                         At 1800 today: 1. Decrease to Clinimix-E 5/15 at 40 mL/hr  TPN to contain standard multivitamins and trace elements.  Wean TPN today, d/c tomorrow if TF titrated to goal rate. 2. HOLD fat emulsion   Previously scheduled for Monday/Thursday  administration d/t nutritional goals  Currently held d/t Propofol infusion  Monitor TG closely on Monday and Thursdays 3. Continue CBGs and resistant SSI Novolog q4h  Delay adding insulin to TPN with large rate decrease on 1/20 and 1/21  Add basal insulin, Lantus 15 units q24h, telephone order from Will, CCS PA.  CCS to monitor when pharmacy s/o TPN dosing. 4. Full TPN lab panels on Mondays and Thursdays.  5. Follow clinical course daily. 6.  Propofol infusion rate, TF advancement, Prostat per MD/RD  Lynann Beaver PharmD, BCPS Pager [phone] 11/07/2014 11:49 AM

## 2014-11-08 ENCOUNTER — Inpatient Hospital Stay (HOSPITAL_COMMUNITY): Payer: 59 | Admitting: Anesthesiology

## 2014-11-08 ENCOUNTER — Inpatient Hospital Stay (HOSPITAL_COMMUNITY): Payer: 59

## 2014-11-08 ENCOUNTER — Encounter (HOSPITAL_COMMUNITY): Admission: EM | Disposition: A | Discharge status: Rehabilitation Facility | Payer: Self-pay | Source: Home / Self Care

## 2014-11-08 ENCOUNTER — Encounter (HOSPITAL_COMMUNITY): Payer: Self-pay | Admitting: Anesthesiology

## 2014-11-08 DIAGNOSIS — J189 Pneumonia, unspecified organism: Secondary | ICD-10-CM

## 2014-11-08 HISTORY — PX: TRACHEOSTOMY TUBE PLACEMENT: SHX814

## 2014-11-08 LAB — GLUCOSE, CAPILLARY
GLUCOSE-CAPILLARY: 152 mg/dL — AB (ref 70–99)
Glucose-Capillary: 120 mg/dL — ABNORMAL HIGH (ref 70–99)
Glucose-Capillary: 127 mg/dL — ABNORMAL HIGH (ref 70–99)
Glucose-Capillary: 142 mg/dL — ABNORMAL HIGH (ref 70–99)
Glucose-Capillary: 144 mg/dL — ABNORMAL HIGH (ref 70–99)
Glucose-Capillary: 183 mg/dL — ABNORMAL HIGH (ref 70–99)

## 2014-11-08 LAB — BASIC METABOLIC PANEL
Anion gap: 7 (ref 5–15)
BUN: 32 mg/dL — AB (ref 6–23)
CHLORIDE: 105 meq/L (ref 96–112)
CO2: 29 mmol/L (ref 19–32)
CREATININE: 1.08 mg/dL (ref 0.50–1.35)
Calcium: 9 mg/dL (ref 8.4–10.5)
GFR calc Af Amer: 84 mL/min — ABNORMAL LOW (ref >90)
GFR, EST NON AFRICAN AMERICAN: 72 mL/min — AB (ref >90)
GLUCOSE: 137 mg/dL — AB (ref 70–99)
Potassium: 4.6 mmol/L (ref 3.5–5.1)
SODIUM: 141 mmol/L (ref 135–145)

## 2014-11-08 SURGERY — CREATION, TRACHEOSTOMY
Anesthesia: General

## 2014-11-08 MED ORDER — NEOSTIGMINE METHYLSULFATE 10 MG/10ML IV SOLN
INTRAVENOUS | Status: AC
  Filled 2014-11-08: qty 1

## 2014-11-08 MED ORDER — FENTANYL CITRATE 0.05 MG/ML IJ SOLN
INTRAMUSCULAR | Status: DC | PRN
  Administered 2014-11-08 (×2): 50 ug via INTRAVENOUS

## 2014-11-08 MED ORDER — VANCOMYCIN HCL 10 G IV SOLR
2500.0000 mg | INTRAVENOUS | Status: AC
  Administered 2014-11-08: 2500 mg via INTRAVENOUS
  Filled 2014-11-08: qty 2500

## 2014-11-08 MED ORDER — PROPOFOL 10 MG/ML IV BOLUS
INTRAVENOUS | Status: AC
Start: 2014-11-08 — End: 2014-11-08
  Filled 2014-11-08: qty 20

## 2014-11-08 MED ORDER — SODIUM CHLORIDE 0.9 % IV SOLN
1250.0000 mg | Freq: Two times a day (BID) | INTRAVENOUS | Status: DC
  Administered 2014-11-08 – 2014-11-09 (×3): 1250 mg via INTRAVENOUS
  Filled 2014-11-08 (×4): qty 1250

## 2014-11-08 MED ORDER — GLYCOPYRROLATE 0.2 MG/ML IJ SOLN
INTRAMUSCULAR | Status: AC
  Filled 2014-11-08: qty 3

## 2014-11-08 MED ORDER — MIDAZOLAM HCL 5 MG/5ML IJ SOLN
INTRAMUSCULAR | Status: DC | PRN
  Administered 2014-11-08: 2 mg via INTRAVENOUS

## 2014-11-08 MED ORDER — PIPERACILLIN-TAZOBACTAM 3.375 G IVPB
3.3750 g | Freq: Three times a day (TID) | INTRAVENOUS | Status: DC
  Administered 2014-11-08 – 2014-11-10 (×8): 3.375 g via INTRAVENOUS
  Filled 2014-11-08 (×7): qty 50

## 2014-11-08 MED ORDER — CISATRACURIUM BESYLATE 20 MG/10ML IV SOLN
INTRAVENOUS | Status: AC
  Filled 2014-11-08: qty 10

## 2014-11-08 MED ORDER — BACITRACIN ZINC 500 UNIT/GM EX OINT
TOPICAL_OINTMENT | Freq: Two times a day (BID) | CUTANEOUS | Status: DC
  Administered 2014-11-08 – 2014-11-11 (×6): via TOPICAL
  Administered 2014-11-11: 1 via TOPICAL
  Administered 2014-11-12 – 2014-11-16 (×9): via TOPICAL
  Administered 2014-11-16: 1 via TOPICAL
  Administered 2014-11-17 (×2): via TOPICAL
  Administered 2014-11-18: 1 via TOPICAL
  Administered 2014-11-18: 12:00:00 via TOPICAL
  Administered 2014-11-19: 1 via TOPICAL
  Administered 2014-11-19 – 2014-11-21 (×4): via TOPICAL
  Filled 2014-11-08 (×2): qty 15

## 2014-11-08 MED ORDER — BACITRACIN ZINC 500 UNIT/GM EX OINT
TOPICAL_OINTMENT | Freq: Two times a day (BID) | CUTANEOUS | Status: DC
  Filled 2014-11-08: qty 28.35

## 2014-11-08 MED ORDER — MIDAZOLAM HCL 2 MG/2ML IJ SOLN
INTRAMUSCULAR | Status: AC
  Filled 2014-11-08: qty 2

## 2014-11-08 MED ORDER — LIDOCAINE-EPINEPHRINE (PF) 1 %-1:200000 IJ SOLN
INTRAMUSCULAR | Status: AC
  Filled 2014-11-08: qty 30

## 2014-11-08 MED ORDER — TRACE MINERALS CR-CU-F-FE-I-MN-MO-SE-ZN IV SOLN
INTRAVENOUS | Status: DC
  Administered 2014-11-08: 18:00:00 via INTRAVENOUS
  Filled 2014-11-08: qty 960

## 2014-11-08 MED ORDER — CISATRACURIUM BESYLATE (PF) 10 MG/5ML IV SOLN
INTRAVENOUS | Status: DC | PRN
  Administered 2014-11-08 (×2): 8 mg via INTRAVENOUS
  Administered 2014-11-08: 4 mg via INTRAVENOUS

## 2014-11-08 MED ORDER — 0.9 % SODIUM CHLORIDE (POUR BTL) OPTIME
TOPICAL | Status: DC | PRN
  Administered 2014-11-08: 1000 mL

## 2014-11-08 MED ORDER — TRACE MINERALS CR-CU-F-FE-I-MN-MO-SE-ZN IV SOLN: INTRAVENOUS | Status: DC

## 2014-11-08 MED ORDER — FENTANYL CITRATE 0.05 MG/ML IJ SOLN
INTRAMUSCULAR | Status: AC
Start: 2014-11-08 — End: 2014-11-08
  Filled 2014-11-08: qty 2

## 2014-11-08 SURGICAL SUPPLY — 30 items
ATTRACTOMAT 16X20 MAGNETIC DRP (DRAPES) ×3 IMPLANT
BAG ZIPLOCK 12X15 (MISCELLANEOUS) ×3 IMPLANT
BLADE SURG 15 STRL LF DISP TIS (BLADE) ×1 IMPLANT
BLADE SURG 15 STRL SS (BLADE) ×2
BLADE SURG SZ11 CARB STEEL (BLADE) ×3 IMPLANT
CLEANER TIP ELECTROSURG 2X2 (MISCELLANEOUS) ×3 IMPLANT
COVER SURGICAL LIGHT HANDLE (MISCELLANEOUS) ×3 IMPLANT
DISSECTOR ROUND CHERRY 3/8 STR (MISCELLANEOUS) IMPLANT
ELECT COATED BLADE 2.86 ST (ELECTRODE) ×3 IMPLANT
ELECT REM PT RETURN 9FT ADLT (ELECTROSURGICAL) ×3
ELECTRODE REM PT RTRN 9FT ADLT (ELECTROSURGICAL) ×1 IMPLANT
GAUZE SPONGE 4X4 16PLY XRAY LF (GAUZE/BANDAGES/DRESSINGS) ×3 IMPLANT
GLOVE BIO SURGEON STRL SZ7.5 (GLOVE) ×3 IMPLANT
HOLDER TRACH TUBE VELCRO 19.5 (MISCELLANEOUS) ×3 IMPLANT
KIT BASIN OR (CUSTOM PROCEDURE TRAY) ×3 IMPLANT
NS IRRIG 1000ML POUR BTL (IV SOLUTION) ×3 IMPLANT
PACK EENT SPLIT (PACKS) ×3 IMPLANT
PENCIL BUTTON HOLSTER BLD 10FT (ELECTRODE) ×3 IMPLANT
SPONGE DRAIN TRACH 4X4 STRL 2S (GAUZE/BANDAGES/DRESSINGS) ×3 IMPLANT
SUT SILK 2 0 (SUTURE) ×2
SUT SILK 2 0 30  PSL (SUTURE) ×4
SUT SILK 2 0 30 PSL (SUTURE) ×2 IMPLANT
SUT SILK 2 0 REEL (SUTURE) ×3 IMPLANT
SUT SILK 2 0 SH (SUTURE) ×9 IMPLANT
SUT SILK 2-0 18XBRD TIE 12 (SUTURE) ×1 IMPLANT
SYR 20CC LL (SYRINGE) ×3 IMPLANT
SYRINGE 10CC LL (SYRINGE) ×3 IMPLANT
TUBE TRACH SHILEY 8 DIST CUF (TUBING) ×3 IMPLANT
WATER STERILE IRR 1500ML POUR (IV SOLUTION) ×3 IMPLANT
YANKAUER SUCT BULB TIP 10FT TU (MISCELLANEOUS) ×3 IMPLANT

## 2014-11-08 NOTE — Op Note (Signed)
NAME:  William [name], William [name]             ACCOUNT NO.:  10/27/2014  MEDICAL RECORD NO.:  [id number]  LOCATION:                                 FACILITY:  PHYSICIAN:  Antony Contraswight D Aries Townley, MD     DATE OF BIRTH:  [birth year]  DATE OF PROCEDURE:  11/08/2014 DATE OF DISCHARGE:                              OPERATIVE REPORT   PREOPERATIVE DIAGNOSIS:  Respiratory failure.  POSTOPERATIVE DIAGNOSIS:  Respiratory failure.  PROCEDURE:  Tracheostomy.  SURGEON:  Antony Contraswight D Leyani Gargus, MD  ANESTHESIA:  General endotracheal anesthesia.  COMPLICATIONS:  None.  INDICATION:  The patient is a 64 year old male with a complicated course related to bowel perforation from diverticulitis, requiring a partial colectomy.  He has been in Critical Care management since that time a little over 10 days ago.  He was able to be extubated about 6 days ago, but required re-intubation the same day due to difficulty breathing. Being that he continues to require the ventilator, tracheostomy was requested.  FINDINGS:  The neck anatomy was without abnormality.  The larynx was low in the neck, requiring elevation for placement of the tracheostomy tube.  DESCRIPTION OF PROCEDURE:  The patient was identified in the Intensive Care Unit and informed consent having been obtained from the wife including discussion of risks, benefits, alternatives, the patient was brought to the operative suite and put on the operative table in a supine position.  Anesthesia was maintained via endotracheal anesthesia. A shoulder roll was placed and the chin was taped superiorly.  The neck was prepped and draped in sterile fashion.  The tracheostomy incision site was marked with a marking pen and injected with 1% lidocaine with 1:100,000 of epinephrine.  A vertical incision was made at that location and subcutaneous fat was removed.  The midline raphe of the strap muscles was identified and divided, and the strap muscles retracted to either side.  Soft  tissues were then incised down to the cricoid, which was grasped with a cricoid hook and elevated.  The thyroid isthmus was then divided using Bovie electrocautery exposing the anterior tracheal wall.  Incision was made between rings 2 and 3 of the trachea and extended with scissors to either side.  Stay suture of 2-0 nylon was placed around the ring above and below the trach site.  The cuff was deflated and the endotracheal tube backed out to above the trach site and a #8 cuffed Shiley trach tube was placed into the site without difficulty.  The cuff was inflated and the inner cannula placed.  The anesthesia circuit was attached and the patient was successfully ventilated without any drop in oxygen saturation.  The stay sutures were then tied with two knots above and one knot below the trach site.  The trach flange was then secured to the anterior neck skin using 2-0 silk suture in 4 quadrants.  The patient was then cleaned off and a trach dressing and Velcro trach tie were added.  The patient was then returned to Anesthesia for transport and was moved back to the Intensive Care Unit in stable condition.     Antony Contraswight D Dawnelle Warman, MD     DDB/MEDQ  D:  11/08/2014  T:  11/08/2014  Job:  [id number]

## 2014-11-08 NOTE — Progress Notes (Signed)
PARENTERAL NUTRITION CONSULT NOTE   Pharmacy Consult for TPN Indication: Prolonged ileus  Allergies  Allergen Reactions  . Aloprim [Allopurinol] Other (See Comments)    Unknown reaction  . Simvastatin Other (See Comments)    Unknown reaction    Patient Measurements: Height: 5' 11.5" (181.6 cm) Weight: 279 lb (126.554 kg) IBW/kg (Calculated) : 76.45 Adjusted Body Weight: 99kg Usual Weight: 114kg  Vital Signs: Temp: 102.8 F (39.3 C) (01/22 0800) Temp Source: Oral (01/22 0800) BP: 131/70 mmHg (01/22 0800) Pulse Rate: 93 (01/22 0427) Intake/Output from previous day: 01/21 0701 - 01/22 0700 In: 2948.8 [I.V.:1218.6; NG/GT:738.3; TPN:991.8] Out: 2175 [Urine:1925; Drains:50; Stool:200]  Labs:  Recent Labs  11/06/14 0545 11/07/14 0350  WBC 12.3* 13.5*  HGB 11.8* 12.6*  HCT 36.9* 38.7*  PLT 385 436*     Recent Labs  11/06/14 0545 11/07/14 0350 11/08/14 0430  NA 134* 134* 141  K 3.9 4.1 4.6  CL 100 101 105  CO2 26 27 29   GLUCOSE 303* 253* 137*  BUN 30* 31* 32*  CREATININE 1.11 1.05 1.08  CALCIUM 8.2* 8.2* 9.0  MG  --  2.2  --   PHOS  --  3.5  --   PROT  --  6.3  --   ALBUMIN  --  2.9*  --   AST  --  21  --   ALT  --  34  --   ALKPHOS  --  78  --   BILITOT  --  0.7  --   TRIG  --  211*  --    Estimated Creatinine Clearance: 98 mL/min (by C-G formula based on Cr of 1.08).    Recent Labs  11/07/14 2335 11/08/14 0339 11/08/14 0803  GLUCAP 144* 120* 142*   Insulin Requirements in past 24 hours:  28 units of resistant SSI Novolog Lantus 15 units  Current Nutrition:  Clinimix-E 5/15 at 40 mL/hr  Vital HP @ 35 ml/hr (Off since midnight for procedure, restart 1030AM)  Prostat BID  Propofol infusion weaning off  Diet: NPO  [facility]  Central access: Left UE PICC (11/04/14) TPN start date: 1/13  ASSESSMENT                                                                                                          HPI: [id number] y/oM with HTN and  nephrolithiasis who [facility] with abdominal pain of 1 day duration secondary to a perforated sigmoid d/t diverticulitis. He was taken emergently to OR on 1/10 and is now s/p partial colectomy. Orders received to start TPN for anticipated prolonged ileus 1/13.  Significant events:  1/13: VDRF post-op remains on propofol (high needs) and pressor support 1/14: Requiring max doses of propofol (3950mcg/kg/min = 38.281ml/hr) 1/15: Propofol at 30 mcg/kg/min (22.9 mL/hr).  Vital HP tube feeding started at fixed rate of 20 mL/hr 1/16: Propofol at 40 mcg/kg/min (30.5 mL/hr).  Extubated (TF and propofol DC), but required reintubation due to agitation.  1/18: Rt UE PICC is coiled - per  MD ok to continue TPN infusion through this line for now.  PICC line replaced in left UE and restarted new TPN at 1800 in new line. 1/19 Proprofol resumed.  No ostomy output, flatus only.  Trial trophic TF @ 10 ml/hr. 1/20 Proprofol infusion remains at ~30 ml/hr.  Rn states no residuals with TF.  Slight increase of TF rate and OK to add prostat.  First stool coming from ostomy; plan to use TF alone when gut function is verified. 1/21 Propofol up to 60 mcg/kg/min overnight, but down to 30 mcg/kg/min now (22.7 ml/hr).  Per MD and RD, titrate TF to goal rate. 1/22 Propofol weaned off, ? Contribution to rash, TFs stopped at midnight for trach placement, resumed 1030AM.   Today, 11/08/2014:   Glucose:  CBGs much improved after addition of lantus  Electrolytes: WNL, no issues this AM    Renal: SCr 1.05, UOP 0.6 ml/kg/hr, I/O: +0.9L  LFTs: all below ULN   TGs: 270 (1/14), 191 (1/18), 211 (1/21)  Prealbumin: 9.7 (1/14), 15.9 (1/18)  NUTRITIONAL GOALS                                                                                              RD recs (updated 1/19)  Needs: 160 g protein/day, 1800-2000 kcal/d  Goal: Parenteral nutrition to provide 60-70% of estimated calorie needs (22-25 kcals/kg ideal body  weight) and 100% of estimated protein needs, based on ASPEN guidelines for hypocaloric, high protein feeding in critically ill obese individuals  Vital HP@ goal rate of 75 ml/hr =  1800 kcal (73% of needs), 158 grams of protein  Clinimix-E 5/15 at a goal rate of 120 ml/hr + 20% fat emulsion at 24ml/hr on Monday and Thursdays to provide: 144 g/day protein, avg of 2180 kcal/day. (Goal rate meets 90% of estimated protein needs and 100% of estimated kcal needs)  Modified plan  Clinimix-E 5/15 at 40 ml/hr (w/o lipids) = 682 kcal, 48 g protein  Tube feeds and prostat supplement per MD/RD  Surgery wants to keep TNA going until TFs at goal rate  PLAN                                                                                                                         At 1800 today: 1. Continue Clinimix-E 5/15 at 40 mL/hr  TPN to contain standard multivitamins and trace elements.  F/u tomorrow if TFs at goal / appropriate to d/c TNA  Continue to hold lipids (plan to wean propofol today)  3. Continue CBGs and resistant SSI Novolog q4h 4. Full TPN lab panels on  Mondays and Thursdays.  5. Follow clinical course daily.  Haynes Hoehn, PharmD, BCPS 11/08/2014, 10:30 AM  Pager: [phone]

## 2014-11-08 NOTE — Progress Notes (Signed)
ANTIBIOTIC CONSULT NOTE - INITIAL  Pharmacy Consult for Vancomycin / Zosyn Indication: HCAP / sepsis  Allergies  Allergen Reactions  . Aloprim [Allopurinol] Other (See Comments)    Unknown reaction  . Simvastatin Other (See Comments)    Unknown reaction    Patient Measurements: Height: 5' 11.5" (181.6 cm) Weight: 279 lb (126.554 kg) IBW/kg (Calculated) : 76.45 Adjusted Body Weight:   Vital Signs: Temp: 102.8 F (39.3 C) (01/22 0800) Temp Source: Oral (01/22 0800) BP: 131/70 mmHg (01/22 0800) Pulse Rate: 93 (01/22 0427) Intake/Output from previous day: 01/21 0701 - 01/22 0700 In: 2948.8 [I.V.:1218.6; NG/GT:738.3; TPN:991.8] Out: 2175 [Urine:1925; Drains:50; Stool:200] Intake/Output from this shift: Total I/O In: -  Out: 150 [Urine:150]  Labs:  Recent Labs  11/06/14 0545 11/07/14 0350 11/08/14 0430  WBC 12.3* 13.5*  --   HGB 11.8* 12.6*  --   PLT 385 436*  --   CREATININE 1.11 1.05 1.08   Estimated Creatinine Clearance: 98 mL/min (by C-G formula based on Cr of 1.08). No results for input(s): VANCOTROUGH, VANCOPEAK, VANCORANDOM, GENTTROUGH, GENTPEAK, GENTRANDOM, TOBRATROUGH, TOBRAPEAK, TOBRARND, AMIKACINPEAK, AMIKACINTROU, AMIKACIN in the last 72 hours.   Microbiology: Recent Results (from the past 720 hour(s))  Anaerobic culture     Status: None   Collection Time: 10/28/14  2:59 AM  Result Value Ref Range Status   Specimen Description ABDOMEN  Final   Special Requests NONE  Final   Gram Stain   Final    FEW WBC PRESENT,BOTH PMN AND MONONUCLEAR NO SQUAMOUS EPITHELIAL CELLS SEEN NO ORGANISMS SEEN Performed at [facility]    Culture   Final    MULTIPLE ORGANISMS PRESENT, NONE PREDOMINANT Performed at [facility]    Report Status 11/04/2014 FINAL  Final  Culture, routine-abscess     Status: None   Collection Time: 10/28/14  2:59 AM  Result Value Ref Range Status   Specimen Description ABDOMEN  Final   Special Requests NONE  Final   Gram Stain   Final    FEW WBC PRESENT,BOTH PMN AND MONONUCLEAR NO SQUAMOUS EPITHELIAL CELLS SEEN NO ORGANISMS SEEN Performed at [facility]    Culture   Final    NO GROWTH 3 DAYS Performed at [facility]    Report Status 10/31/2014 FINAL  Final  Culture, blood (routine x 2)     Status: None   Collection Time: 10/28/14  5:55 AM  Result Value Ref Range Status   Specimen Description BLOOD RIGHT ANTECUBITAL  Final   Special Requests BOTTLES DRAWN AEROBIC AND ANAEROBIC 10CC  Final   Culture   Final    NO GROWTH 5 DAYS Note: Culture results may be compromised due to an excessive volume of blood received in culture bottles. Performed at [facility]    Report Status 11/03/2014 FINAL  Final  Culture, blood (routine x 2)     Status: None   Collection Time: 10/28/14  6:00 AM  Result Value Ref Range Status   Specimen Description BLOOD RIGHT ARM  Final   Special Requests BOTTLES DRAWN AEROBIC AND ANAEROBIC 5CC  Final   Culture   Final    NO GROWTH 5 DAYS Performed at [facility]    Report Status 11/03/2014 FINAL  Final  MRSA PCR Screening     Status: None   Collection Time: 10/28/14  8:55 AM  Result Value Ref Range Status   MRSA by PCR NEGATIVE NEGATIVE Final    Comment:  The GeneXpert MRSA Assay (FDA approved for NASAL specimens only), is one component of a comprehensive MRSA colonization surveillance program. It is not intended to diagnose MRSA infection nor to guide or monitor treatment for MRSA infections.   Culture, Urine     Status: None   Collection Time: 10/28/14  9:40 AM  Result Value Ref Range Status   Specimen Description URINE, CATHETERIZED  Final   Special Requests NONE  Final   Colony Count NO GROWTH [facility]   Final   Culture NO GROWTH [facility]formed at [facility]   Final   Report Status 10/29/2014 FINAL  Final    Medical History: Past Medical History  Diagnosis Date  . Renal  disorder     kidney stones  . Hypertension   . Thyroid disease    Assessment: [age] yoM former smoker admitted initially with perforated sigmoid colon planned to be managed conservatively but became hypotensive and taken emergently to OR for partial colectomy.  Pt remained on vent post-op with pressors and had empiric antibiotics/antifungal for intra-abd abscess.  Pt now off pressors and abx, however failed extubation 1/16 and tracheostomy performed 1/22.  Pt with new fever this morning and pharmacy re-consulted to resume vancomycin and zosyn for HCAP.    1/11 >> Zosyn >>1/18 1/11 >> Vancomycin >> 1/16 1/11 >> Micafungin >> 1/18 1/22 >> Vancomycin >> 1/22 >. Zosyn >>  Tmax24: 102.8  WBCs: increasing, 13.5 (1/21) Renal: SCr 1.08, CrCl 98 CG (73 N)  1/11 blood x 2: NGF 1/11 urine: NGF 1/11 anaerobic abscess: multiple organisms present, none predominant 1/11 aerobic abscess: NGF 1/11 MRSA PCR: negative 1/22 blood x 2: ordered  Dose changes/drug level info:  1/11: Changed Vancomycin to 1g IV q12h per obesity nomogram/Normalized CrCl 1/13 1500 vanco trough = 8.5 mcg/ml on 1gm IV q12h - prior to 5th maintenance dose (drawn 1h past due time of 14:00). Increased to q8 dosing, but hesitant to increase further with obesity and risk of accumulation. Ordered another trough at new steady state on 1/15 1/15: 0500 VT 17.6 mg/dL on 1g q8. Concerned over potential for accumulation - changed to 1250mg  q12h.  Goal of Therapy:  Vancomycin trough level 15-20 mcg/ml  Plan:  Vancomycin loading dose 2500mg  IV x 1, then 1250mg  IV q12h Zosyn 3.375g IV q8h (infuse over 4 hours) F/u renal fxn, cultures, clinical course, vanc trough at [facility]  William [name], PharmD, BCPS 11/08/2014, 9:59 AM  Pager: [phone]

## 2014-11-08 NOTE — Progress Notes (Signed)
NUTRITION FOLLOW UP  Intervention:   -TPN per pharmacy- Recommend weaning further to prevent overfeeding.  -Diet advancement per MD -Re-start Vital HP@ 35 ml/hr via NGT and increase by 10 ml every 4 hours to goal rate of 75 ml/hr.   Goal tube feeding regimen provides 1800 kcal (73% of needs), 158 grams of protein, and 1505 ml of H2O.  -Adjusted Prostat to 30 ml BID. Once TF at goal rate, recommend D/C Prostat  (protein needs are met with TF regimen) -RD to continue to monitor  Nutrition Dx:   Inadequate oral intake related to inability to eat as evidenced by NPO; ongoing  Goal:   Parenteral/Enteral nutrition to provide 60-70% of estimated calorie needs (22-25 kcals/kg ideal body weight) and 100% of estimated protein needs, based on ASPEN guidelines for hypocaloric, high protein feeding in critically ill obese individuals  Monitor:   TPN weaning, TF tolerance, GI profile, weight trend, vent status/settings, labs  Assessment:   64 y/o male former smoker presented with abd pain from perforated sigmoid colon taken to OR emergently. Remained on vent post op. Failed extubation 1/16, reintubated.   1/10 Admit, Partial colectomy 1/16 extubated, reintubated in pm 1/22 tracheostomy  - TF stopped at midnight 1/21 for trach placement this am.  - Pt s/p tracheostomy. Per RN, TF to be resumed at rate of 35 mL/hr (rate before stopped at midnight.)  - Pt tolerating TF. Per MD, pt with rash that may be from TPN.   Current TPN per pharmacy:Clinimix-E 5/15 at 40 ml/hr (w/o lipids) = 682 kcal, 48 g protein  TPN with propofol and TF and at 35 mL per hour to provide 2324 kcal (90% of estimated needs- goal is 60-70% of estimated needs) and 122 g protein (76% of estimated needs- goal is 100%)  Patient is currently intubated on ventilator support MV: 13.1 L/min Temp (24hrs), Avg:99.9 F (37.7 C), Min:98.8 F (37.1 C), Max:102.8 F (39.3 C)  Propofol: 30.4 ml/hr to provide 803 kcal from  fat  Labs: CBGs 120-144 Na, K, Mg, and Phos WNL  Height: Ht Readings from Last 1 Encounters:  10/28/14 5' 11.5" (1.816 m)    Weight Status:   Wt Readings from Last 1 Encounters:  11/08/14 279 lb (126.554 kg)  11/06/14 275 lb 10/30/14 293 lb 10/27/14 280 lb  Re-estimated needs(1/21):  Kcal: 2593 (1800-2000 kcal = 22-25 kcal/kg IBW) Protein:~160 gram protein Fluid: per MD  Skin:  Surgical incision on abd, +1 generalized edema, +1 perineal edema, +1 RLE, +1 LLE edema  Diet Order: Diet NPO time specified .TPN (CLINIMIX-E) Adult   Intake/Output Summary (Last 24 hours) at 11/08/14 1002 Last data filed at 11/08/14 0757  Gross per 24 hour  Intake 2446.62 ml  Output   1925 ml  Net 521.62 ml    Last BM: 1/22 -colostomy   Labs:   Recent Labs Lab 11/03/14 0443 11/04/14 0443  11/06/14 0545 11/07/14 0350 11/08/14 0430  NA 139 139  < > 134* 134* 141  K 3.5 3.9  < > 3.9 4.1 4.6  CL 95* 100  < > 100 101 105  CO2 33* 29  < > 26 27 29   BUN 27* 25*  < > 30* 31* 32*  CREATININE 1.14 0.94  < > 1.11 1.05 1.08  CALCIUM 8.5 8.4  < > 8.2* 8.2* 9.0  MG 2.2 2.1  --   --  2.2  --   PHOS 3.4 3.1  --   --  3.5  --  GLUCOSE 182* 183*  < > 303* 253* 137*  < > = values in this interval not displayed.  CBG (last 3)   Recent Labs  11/07/14 2335 11/08/14 0339 11/08/14 0803  GLUCAP 144* 120* 142*    Scheduled Meds: . antiseptic oral rinse  7 mL Mouth Rinse QID  . chlorhexidine  15 mL Mouth Rinse BID  . feeding supplement (PRO-STAT SUGAR FREE 64)  30 mL Oral BID  . feeding supplement (VITAL HIGH PROTEIN)  1,000 mL Per Tube Q24H  . guaiFENesin  10 mL Per Tube BID  . heparin  5,000 Units Subcutaneous 3 times per day  . insulin aspart  0-20 Units Subcutaneous 6 times per day  . insulin glargine  15 Units Subcutaneous Daily  . levothyroxine  75 mcg Intravenous Daily  . lip balm  1 application Topical BID  . metoprolol tartrate  25 mg Per Tube BID  . nystatin   Topical BID   . pantoprazole (PROTONIX) IV  40 mg Intravenous Q24H  . piperacillin-tazobactam (ZOSYN)  IV  3.375 g Intravenous 3 times per day  . sodium chloride  10-40 mL Intracatheter Q12H  . vancomycin  1,250 mg Intravenous Q12H  . vancomycin  2,500 mg Intravenous NOW    Continuous Infusions: . Marland KitchenTPN (CLINIMIX-E) Adult 40 mL/hr at 11/07/14 2200  . sodium chloride 10 mL/hr at 11/08/14 0600  . fentaNYL infusion INTRAVENOUS 125 mcg/hr (11/08/14 0758)    Laurette Schimke MS, RD, LDN Pager # [phone]

## 2014-11-08 NOTE — Transfer of Care (Signed)
Immediate Anesthesia Transfer of Care Note  Patient: William [name]  Procedure(s) Performed: Procedure(s): TRACHEOSTOMY (N/A)  Patient Location: ICU  Anesthesia Type:General  Level of Consciousness: sedated, unresponsive, obtunded and Patient remains intubated per anesthesia plan  Airway & Oxygen Therapy: Patient remains intubated per anesthesia plan and Patient placed on Ventilator (see vital sign flow sheet for setting)  Post-op Assessment: Report given to PACU RN and Post -op Vital signs reviewed and stable  Post vital signs: Reviewed and stable  Complications: No apparent anesthesia complications

## 2014-11-08 NOTE — Brief Op Note (Signed)
10/27/2014 - 11/08/2014  9:04 AM  PATIENT:  William [name]  64 y.o. male  PRE-OPERATIVE DIAGNOSIS:  Respiratory failure  POST-OPERATIVE DIAGNOSIS:  same  PROCEDURE:  Procedure(s): TRACHEOSTOMY (N/A)  SURGEON:  Surgeon(s) and Role:    * Christia Readingwight Jackalynn Art, MD - Primary  PHYSICIAN ASSISTANT:   ASSISTANTS: none   ANESTHESIA:   general  EBL:  Total I/O In: -  Out: 150 [Urine:150]  BLOOD ADMINISTERED:none  DRAINS: none   LOCAL MEDICATIONS USED:  LIDOCAINE   SPECIMEN:  No Specimen  DISPOSITION OF SPECIMEN:  N/A  COUNTS:  YES  TOURNIQUET:  * No tourniquets in log *  DICTATION: .Other Dictation: Dictation Number [id number]  PLAN OF CARE: return to icu  PATIENT DISPOSITION:  ICU - intubated and hemodynamically stable.   Delay start of Pharmacological VTE agent (>24hrs) due to surgical blood loss or risk of bleeding: no

## 2014-11-08 NOTE — Progress Notes (Signed)
PULMONARY / CRITICAL CARE MEDICINE   Name: William [name] MRN: [id number] DOB: 1951-07-28    ADMISSION DATE:  10/27/2014  CHIEF COMPLAINT:  Perforated sigmoid colon  BRIEF PATIENT DESCRIPTION:   64 y/o male former smoker presented with abd pain from perforated sigmoid colon taken to OR emergently.  Remained on vent post op.  Failed extubation 1/16, reintubated.    SIGNIFICANT EVENTS:  1/10  Admit, Partial colectomy  1/13  Off pressors 1/14  On TNA 1/16  Extubated >> reintubated in PM 1/19  Agitation, hypertension overnight, sedation changed to propofol. 60% / 8 1/20 PEEP weaned to 5, FiO2 40%, diuresing 1/22 more hypoxemic, febrile, plan tracheostomy  STUDIES: 1/10  CT abd/pelvis >> sigmoid colon diverticulitis with perforation, umbilical hernia, b/l renal stones, 8 mm RLL nodule 1/11  ECHO >> EF 55-60%, nml systolic fxn, PA peak 32   1/17  CT Head >> no acute intracranial abnormality, ? Small vessel ischemic changes, L maxillary sinus disease  SUBJECTIVE:  Febrile, more hypoxemic plan trach today   VITAL SIGNS: Temp:  [98.8 F (37.1 C)-102.8 F (39.3 C)] 102.8 F (39.3 C) (01/22 0800) Pulse Rate:  [93-96] 93 (01/22 0427) Resp:  [0-25] 23 (01/22 0800) BP: (88-157)/(53-87) 131/70 mmHg (01/22 0800) SpO2:  [85 %-98 %] 88 % (01/22 0800) FiO2 (%):  [40 %-70 %] 50 % (01/22 0727) Weight:  [126.554 kg (279 lb)] 126.554 kg (279 lb) (01/22 0300)   VENTILATOR SETTINGS: Vent Mode:  [-] PRVC FiO2 (%):  [40 %-70 %] 50 % Set Rate:  [18 bmp] 18 bmp Vt Set:  [610 mL] 610 mL PEEP:  [5 cmH20-8 cmH20] 5 cmH20 Pressure Support:  [8 cmH20] 8 cmH20 Plateau Pressure:  [12 cmH20-21 cmH20] 16 cmH20   INTAKE / OUTPUT: Intake/Output      01/21 0701 - 01/22 0700 01/22 0701 - 01/23 0700   I.V. (mL/kg) 1218.6 (9.6)    Other 0    NG/GT 738.3    TPN 991.8    Total Intake(mL/kg) 2948.8 (23.3)    Urine (mL/kg/hr) 1925 (0.6) 150 (1)   Emesis/NG output 0 (0)    Drains 50 (0)    Stool 200 (0.1)     Total Output 2175 150   Net +773.8 -150          PHYSICAL EXAMINATION:  Gen: sedated on vent HEENT: NCAT EOMi, ETT PULM:  rhonchi bases CV: tachy, no mgr AB: infrequent bowel sounds, midline scar Derm: macular erythematous rash Ext: SCD Neuro: sedated on vent, maew  LABS:  CBC  Recent Labs Lab 11/05/14 0421 11/06/14 0545 11/07/14 0350  WBC 14.0* 12.3* 13.5*  HGB 12.5* 11.8* 12.6*  HCT 38.0* 36.9* 38.7*  PLT 248 385 436*   BMET  Recent Labs Lab 11/06/14 0545 11/07/14 0350 11/08/14 0430  NA 134* 134* 141  K 3.9 4.1 4.6  CL 100 101 105  CO2 BUN 30* 31* 32*  CREATININE 1.11 1.05 1.08  GLUCOSE 303* 253* 137*   Electrolytes  Recent Labs Lab 11/03/14 0443 11/04/14 0443  11/06/14 0545 11/07/14 0350 11/08/14 0430  CALCIUM 8.5 8.4  < > 8.2* 8.2* 9.0  MG 2.2 2.1  --   --  2.2  --   PHOS 3.4 3.1  --   --  3.5  --   < > = values in this interval not displayed. Sepsis Markers  Recent Labs Lab 11/02/14 0439 11/05/14 0420  LATICACIDVEN 1.1 1.3   ABG  Recent  Labs Lab 11/03/14 0250  PHART 7.443  PCO2ART 45.2*  PO2ART 86.6   Liver Enzymes  Recent Labs Lab 11/04/14 0443 11/07/14 0350  AST 23 21  ALT 24 34  ALKPHOS 62 78  BILITOT 0.9 0.7  ALBUMIN 2.7* 2.9*   Glucose  Recent Labs Lab 11/07/14 0751 11/07/14 1221 11/07/14 1638 11/07/14 1937 11/07/14 2335 11/08/14 0339  GLUCAP 200* 208* 222* 171* 144* 120*    Imaging Dg Chest Port 1 View  11/07/2014   CLINICAL DATA:  Subsequent evaluation of acute respiratory failure with hypoxia  EXAM: PORTABLE CHEST - 1 VIEW  COMPARISON:  11/07/2014 at 5:04 a.m.  FINDINGS: No change in the position of orogastric or endotracheal tubes. No change in the appearance of moderate cardiac enlargement. Stable moderate to severe vascular congestion. Consolidation right lower lobe appears improved and at least partially due to atelectasis. More extensive consolidation in the left lower lobe with air  bronchograms, stable.  IMPRESSION: 1. Stable cardiac enlargement and pulmonary venous congestion 2. Persistent but improved right lower lobe opacity suggesting atelectasis 3. Stable left lower lobe opacity representing atelectasis or pneumonia.   Electronically Signed   By: Esperanza Heiraymond  Rubner M.D.   On: 11/07/2014 10:55   Dg Chest Port 1 View  11/07/2014   CLINICAL DATA:  Respiratory failure.  Hypoxia.  EXAM: PORTABLE CHEST - 1 VIEW  COMPARISON:  11/06/2014  FINDINGS: There is an endotracheal tube with tip at the clavicular heads. Gastric suction tube reaches the stomach. Stable positioning of left upper extremity PICC, tip at the SVC level.  Unchanged cardiomegaly. Aortic and hilar contours are stable. Hazy density of the lower chest is increased in density and extent. There is pulmonary venous congestion best visualized in the upper lungs. No pneumothorax.  IMPRESSION: Worsening basilar aeration which could reflect progressive pneumonia or atelectasis. There is pulmonary venous congestion and pleural effusions.   Electronically Signed   By: Tiburcio PeaJonathan  Watts M.D.   On: 11/07/2014 06:39    ASSESSMENT / PLAN:  PULMONARY ETT 1/10 >> 1/16, 1/16 >> A: Acute respiratory failure with hypoxemia, worse 1/21> suspect HCAP, +/- pulm edema (net even hospitalization) Prolonged Vent course> tracheostomy today Hx of tobacco abuse with 8 mm RLL nodule on CT abd/pelvis Hx of OSA P:   Wean FiO2 / PEEP to keep SaO2 > 90% Tracheostomy today See ID Continue guaifenesin, pulm toilette  CARDIOVASCULAR L femoral CVC 1/11 >> 1/18 RUE PICC 1/13 >> 1/18 (coiled) LUE PICC 1/18 >>  A: Septic shock 2nd to peritonitis >> resolved Demand ischemia > resolved HTN P:   Monitor hemodynamics Hold outpatient lisinopril Continue metoprolol 25mg  bid Hold diuresis 1/22  RENAL A: Lactic Acidosis  - resolved P:   Monitor renal fx, urine outpt, electrolytes Daily KCL replacement  GASTROINTESTINAL A: Perforated Sigmoid  s/p Partial Colectomy (1/10) Nutrition P:   Post-op care, nutrition per [facility] Protonix for SUP TNA / Lipids per Pharmacy > contributing to rash?? Would like to stop if able  HEMATOLOGIC A: Anemia of critical illness P: Intermittent CBC, transfuse if Hgb < 7gm/dL SQ heparin for DVT prevention  INFECTIOUS A: Septic shock 2nd to peritonitis from perforated sigmoid colon > resolved Left Maxillary Sinus Disease  HCAP 1/22 P:   7 days zosyn, micafungin > d/c'd 1/18  Restart Vanc/Zosyn 1/22 > plan 5 day course for HCAP, narrow as able per cultures  Blood Cx 1/11 >> neg Blood Cx 1/22 >> Resp Cx 1/22 >>  ENDOCRINE A: Hyperglycemia Hx of  hypothyroidism P:   SSI  Continue synthroid IV  NEUROLOGIC A: Post-op pain control Agitation - CT head 1/17 neg for acute abnormality P: RASS Goal: 0 Supportive care:  Minimize sedation as able, promote sleep wake cycle, mobilize etc  D/c Propofol sedation (rash?) Fentanyl gtt  Family: Wife updated bedside 1/22  Today's Summary : Slow weaning, vent day 13, plan trach today, add HCAP treatment  CC time 35 minutes  Heber Rogers, MD [facility] Pager: [phone] Cell: [phone] If no response, call [phone]

## 2014-11-08 NOTE — Progress Notes (Signed)
Day of Surgery  Subjective: William [name] is still sedated on the vent, but William [name] is still very active.  William [name] has stool coming from his ostomy.  Tube feeds are still at 97ml/h.  Weaning slowly.   Objective: Vital signs in last 24 hours: Temp:  [98.8 F (37.1 C)-100.2 F (37.9 C)] 100.2 F (37.9 C) (01/22 0341) Pulse Rate:  [93-96] 93 (01/22 0427) Resp:  [0-25] 20 (01/22 0700) BP: (88-157)/(53-87) 112/59 mmHg (01/22 0700) SpO2:  [85 %-98 %] 88 % (01/22 0700) FiO2 (%):  [40 %-70 %] 50 % (01/22 0727) Weight:  [279 lb (126.554 kg)] 279 lb (126.554 kg) (01/22 0300) Last BM Date: 11/07/14  . Marland KitchenTPN (CLINIMIX-E) Adult 40 mL/hr at 11/07/14 2200  . sodium chloride 10 mL/hr at 11/08/14 0600  . fentaNYL infusion INTRAVENOUS 125 mcg/hr (11/08/14 0758)  . propofol 40 mcg/kg/min (11/08/14 2595)   Intake/Output from previous day: 01/21 0701 - 01/22 0700 In: 2948.8 [I.V.:1218.6; NG/GT:738.3; TPN:991.8] Out: 2175 [Urine:1925; Drains:50; Stool:200] Intake/Output this shift: Total I/O In: -  Out: 150 [Urine:150]  General appearance: alert, cooperative and no distress Resp: clear anterior exam. GI: Wound vac in place, William [name] has stool coming from the ostomy.     Lab Results:   Recent Labs  11/06/14 0545 11/07/14 0350  WBC 12.3* 13.5*  HGB 11.8* 12.6*  HCT 36.9* 38.7*  PLT 385 436*    BMET  Recent Labs  11/07/14 0350 11/08/14 0430  NA 134* 141  K 4.1 4.6  CL 101 105  CO2 27 29  GLUCOSE 253* 137*  BUN 31* 32*  CREATININE 1.05 1.08  CALCIUM 8.2* 9.0   PT/INR No results for input(s): LABPROT, INR in the last 72 hours.   Recent Labs Lab 11/04/14 0443 11/07/14 0350  AST 23 21  ALT 24 34  ALKPHOS 62 78  BILITOT 0.9 0.7  PROT 6.3 6.3  ALBUMIN 2.7* 2.9*     Lipase     Component Value Date/Time   LIPASE 23 10/27/2014 1810     Studies/Results: Dg Chest Port 1 View  11/07/2014   CLINICAL DATA:  Subsequent evaluation of acute respiratory failure with hypoxia  EXAM: PORTABLE CHEST - 1  VIEW  COMPARISON:  11/07/2014 at 5:04 a.m.  FINDINGS: No change in the position of orogastric or endotracheal tubes. No change in the appearance of moderate cardiac enlargement. Stable moderate to severe vascular congestion. Consolidation right lower lobe appears improved and at least partially due to atelectasis. More extensive consolidation in the left lower lobe with air bronchograms, stable.  IMPRESSION: 1. Stable cardiac enlargement and pulmonary venous congestion 2. Persistent but improved right lower lobe opacity suggesting atelectasis 3. Stable left lower lobe opacity representing atelectasis or pneumonia.   Electronically Signed   By: Esperanza Heir M.D.   On: 11/07/2014 10:55   Dg Chest Port 1 View  11/07/2014   CLINICAL DATA:  Respiratory failure.  Hypoxia.  EXAM: PORTABLE CHEST - 1 VIEW  COMPARISON:  11/06/2014  FINDINGS: There is an endotracheal tube with tip at the clavicular heads. Gastric suction tube reaches the stomach. Stable positioning of left upper extremity PICC, tip at the SVC level.  Unchanged cardiomegaly. Aortic and hilar contours are stable. Hazy density of the lower chest is increased in density and extent. There is pulmonary venous congestion best visualized in the upper lungs. No pneumothorax.  IMPRESSION: Worsening basilar aeration which could reflect progressive pneumonia or atelectasis. There is pulmonary venous congestion and pleural effusions.   Electronically  Signed   By: Tiburcio PeaJonathan  Watts M.D.   On: 11/07/2014 06:39    Medications: . antiseptic oral rinse  7 mL Mouth Rinse QID  . chlorhexidine  15 mL Mouth Rinse BID  . ethacrynic acid  50 mg Oral BID  . feeding supplement (PRO-STAT SUGAR FREE 64)  30 mL Oral BID  . feeding supplement (VITAL HIGH PROTEIN)  1,000 mL Per Tube Q24H  . guaiFENesin  10 mL Per Tube BID  . heparin  5,000 Units Subcutaneous 3 times per day  . insulin aspart  0-20 Units Subcutaneous 6 times per day  . insulin glargine  15 Units Subcutaneous  Daily  . levothyroxine  75 mcg Intravenous Daily  . lip balm  1 application Topical BID  . metoprolol tartrate  25 mg Per Tube BID  . nystatin   Topical BID  . pantoprazole (PROTONIX) IV  40 mg Intravenous Q24H  . sodium chloride  10-40 mL Intracatheter Q12H    Assessment/Plan 1. Sigmoid diverticulitis, Perforated Bowel EXPLORATORY LAPAROTOMY, PARTIAL COLECTOMY Sigmoid, COLOSTOMY,  Repair umbilical hernia, 10/28/2014, William PrettyPaul Toth III, MD.  2. Sepsis with hypotension 3.Ventilator dependant respiratory failure/EXTUBATED/RE intubated 11/02/14  4. Hx of hypertension, now requiring pressors 5. Hypothyroid TSH 16 6. Hx of tobacco use 30 years (quit 9 years ago) 7. Body mass index is 38.51 kg  8. Hx of nephrolithiasis 9. Heparin for DVT prophylaxis 10. Malnutrition/TNA, going to see how William [name] does with some TF. Prealbumin 15.9   Plan:  Trach today with Dr Jenne PaneBates.  Will restart TF's after that. Once to goal on TF's, we can d/c TPN.  Wean vent per CCM.     LOS: 12 days    Bless Lisenby C. 11/08/2014

## 2014-11-08 NOTE — Anesthesia Preprocedure Evaluation (Signed)
Anesthesia Evaluation  Patient identified by MRN, date of birth, ID band Patient awake    Reviewed: Allergy & Precautions, NPO status , Patient's Chart, lab work & pertinent test results  Airway        Dental   Pulmonary sleep apnea , COPDformer smoker,          Cardiovascular hypertension,     Neuro/Psych    GI/Hepatic   Endo/Other    Renal/GU Renal disease     Musculoskeletal   Abdominal   Peds  Hematology   Anesthesia Other Findings Respiratory Failure  Reproductive/Obstetrics                             Anesthesia Physical Anesthesia Plan  ASA: III  Anesthesia Plan: General   Post-op Pain Management:    Induction: Intravenous  Airway Management Planned:   Additional Equipment:   Intra-op Plan:   Post-operative Plan:   Informed Consent:   Plan Discussed with: CRNA, Anesthesiologist and Surgeon  Anesthesia Plan Comments: (Intubated Unable to communicate. GES)        Anesthesia Quick Evaluation

## 2014-11-08 NOTE — Anesthesia Postprocedure Evaluation (Signed)
  Anesthesia Post-op Note  Patient: William [name]  Procedure(s) Performed: Procedure(s): TRACHEOSTOMY (N/A)  Patient Location: SICU  Anesthesia Type:General  Level of Consciousness: sedated and Patient remains intubated per anesthesia plan  Airway and Oxygen Therapy: Patient remains intubated per anesthesia plan and Patient placed on Ventilator (see vital sign flow sheet for setting)  Post-op Pain: none  Post-op Assessment: Post-op Vital signs reviewed, Patient's Cardiovascular Status Stable, RESPIRATORY FUNCTION UNSTABLE, Patent Airway, No signs of Nausea or vomiting and Pain level controlled  Post-op Vital Signs: stable  Last Vitals:  Filed Vitals:   11/08/14 0800  BP: 131/70  Pulse:   Temp: 39.3 C  Resp: 23    Complications: No apparent anesthesia complications

## 2014-11-09 ENCOUNTER — Inpatient Hospital Stay (HOSPITAL_COMMUNITY): Payer: 59

## 2014-11-09 LAB — CBC WITH DIFFERENTIAL/PLATELET
BASOS PCT: 0 % (ref 0–1)
Basophils Absolute: 0 10*3/uL (ref 0.0–0.1)
EOS ABS: 0.3 10*3/uL (ref 0.0–0.7)
Eosinophils Relative: 2 % (ref 0–5)
HEMATOCRIT: 37.5 % — AB (ref 39.0–52.0)
Hemoglobin: 11.8 g/dL — ABNORMAL LOW (ref 13.0–17.0)
LYMPHS ABS: 1 10*3/uL (ref 0.7–4.0)
LYMPHS PCT: 8 % — AB (ref 12–46)
MCH: 31.1 pg (ref 26.0–34.0)
MCHC: 31.5 g/dL (ref 30.0–36.0)
MCV: 98.7 fL (ref 78.0–100.0)
MONO ABS: 0.8 10*3/uL (ref 0.1–1.0)
MONOS PCT: 6 % (ref 3–12)
Neutro Abs: 11.4 10*3/uL — ABNORMAL HIGH (ref 1.7–7.7)
Neutrophils Relative %: 84 % — ABNORMAL HIGH (ref 43–77)
Platelets: 459 10*3/uL — ABNORMAL HIGH (ref 150–400)
RBC: 3.8 MIL/uL — ABNORMAL LOW (ref 4.22–5.81)
RDW: 14.5 % (ref 11.5–15.5)
WBC: 13.6 10*3/uL — AB (ref 4.0–10.5)

## 2014-11-09 LAB — BASIC METABOLIC PANEL
Anion gap: 5 (ref 5–15)
BUN: 28 mg/dL — ABNORMAL HIGH (ref 6–23)
CALCIUM: 8.3 mg/dL — AB (ref 8.4–10.5)
CO2: 28 mmol/L (ref 19–32)
Chloride: 107 mmol/L (ref 96–112)
Creatinine, Ser: 1.18 mg/dL (ref 0.50–1.35)
GFR calc Af Amer: 75 mL/min — ABNORMAL LOW (ref >90)
GFR, EST NON AFRICAN AMERICAN: 65 mL/min — AB (ref >90)
GLUCOSE: 152 mg/dL — AB (ref 70–99)
Potassium: 4.5 mmol/L (ref 3.5–5.1)
Sodium: 140 mmol/L (ref 135–145)

## 2014-11-09 LAB — GLUCOSE, CAPILLARY
GLUCOSE-CAPILLARY: 142 mg/dL — AB (ref 70–99)
Glucose-Capillary: 126 mg/dL — ABNORMAL HIGH (ref 70–99)
Glucose-Capillary: 143 mg/dL — ABNORMAL HIGH (ref 70–99)
Glucose-Capillary: 159 mg/dL — ABNORMAL HIGH (ref 70–99)
Glucose-Capillary: 120 mg/dL — ABNORMAL HIGH (ref 70–99)

## 2014-11-09 MED ORDER — ACETAMINOPHEN 160 MG/5ML PO SOLN
650.0000 mg | Freq: Four times a day (QID) | ORAL | Status: DC | PRN
  Administered 2014-11-09 – 2014-11-10 (×4): 650 mg
  Filled 2014-11-09 (×5): qty 20.3

## 2014-11-09 MED ORDER — FLUCONAZOLE 100 MG PO TABS
100.0000 mg | ORAL_TABLET | Freq: Every day | ORAL | Status: DC
  Administered 2014-11-09 – 2014-11-10 (×2): 100 mg via ORAL
  Filled 2014-11-09 (×2): qty 1

## 2014-11-09 NOTE — Progress Notes (Addendum)
CRITICAL VALUE ALERT  Critical value received:  Blood cultures, anaerobic bottle positive for gram negative rods       2nd bottle also positive for gram negative rods @ 0515.   Date of notification: 11/09/14  Time of notification:  2320    Critical value read back:Yes.    Nurse who received alert:  Zaeda Mcferran, RN   MD notified (1st page):  Elink  Time of first page:  2321  MD notified (2nd page):  Time of second page:  Responding MD:  Pola CornElink  Time MD responded:  2321

## 2014-11-09 NOTE — Progress Notes (Signed)
Rn calling elink  Temp 102.25F  Plan No tylenol  Rescue cooling if temp > 10.3.6F depending on clinical scenario  Dr. Kalman ShanMurali Walden Statz, M.D., [facility] CenterF.C.C.P Pulmonary and Critical Care Medicine Staff Physician [facility] Pager: [phone], If no answer or between  15:00h - 7:00h: call [phone]  11/09/2014 12:05 AM

## 2014-11-09 NOTE — Progress Notes (Signed)
PULMONARY / CRITICAL CARE MEDICINE   Name: William [name] MRN: [id number] DOB: 09-Dec-1950    ADMISSION DATE:  10/27/2014  CHIEF COMPLAINT:  Perforated sigmoid colon  BRIEF PATIENT DESCRIPTION:   64 y/o male former smoker presented with abd pain from perforated sigmoid colon taken to OR emergently.  Remained on vent post op.  Failed extubation 1/16, reintubated.    SIGNIFICANT EVENTS:  1/10  Admit, Partial colectomy  1/13  Off pressors 1/14  On TNA 1/16  Extubated >> reintubated in PM 1/19  Agitation, hypertension overnight, sedation changed to propofol. 60% / 8 1/20 PEEP weaned to 5, FiO2 40%, diuresing 1/22 more hypoxemic, febrile, plan tracheostomy  STUDIES: 1/10  CT abd/pelvis >> sigmoid colon diverticulitis with perforation, umbilical hernia, b/l renal stones, 8 mm RLL nodule 1/11  ECHO >> EF 55-60%, nml systolic fxn, PA peak 32   1/17  CT Head >> no acute intracranial abnormality, ? Small vessel ischemic changes, L maxillary sinus disease  SUBJECTIVE:  Febrile, more hypoxemic plan trach today   VITAL SIGNS: Temp:  [100.3 F (37.9 C)-102.9 F (39.4 C)] 101.9 F (38.8 C) (01/23 0800) Pulse Rate:  [98-110] 98 (01/23 0333) Resp:  [15-27] 18 (01/23 0800) BP: (94-166)/(43-85) 112/54 mmHg (01/23 0800) SpO2:  [86 %-98 %] 89 % (01/23 0800) FiO2 (%):  [50 %-60 %] 50 % (01/23 0800) Weight:  [126.554 kg (279 lb)] 126.554 kg (279 lb) (01/23 0444)   VENTILATOR SETTINGS: Vent Mode:  [-] PRVC FiO2 (%):  [50 %-60 %] 50 % Set Rate:  [18 bmp] 18 bmp Vt Set:  [610 mL] 610 mL PEEP:  [8 cmH20-10 cmH20] 8 cmH20 Plateau Pressure:  [14 cmH20-19 cmH20] 19 cmH20   INTAKE / OUTPUT: Intake/Output      01/22 0701 - 01/23 0700 01/23 0701 - 01/24 0700   I.V. (mL/kg) 1498.7 (11.8) 50 (0.4)   Other     NG/GT 765 55   IV Piggyback 400    TPN 959.3 40   Total Intake(mL/kg) 3623 (28.6) 145 (1.1)   Urine (mL/kg/hr) 2185 (0.7) 100 (0.3)   Emesis/NG output 0 (0) 0 (0)   Drains 0 (0)    Stool  185 (0.1)    Total Output 2370 100   Net +1253 +45          PHYSICAL EXAMINATION:  Gen: sedated on vent HEENT: NCAT EOMi, ETT PULM:  rhonchi bases CV: tachy, no mgr AB: infrequent bowel sounds, midline scar Derm: macular erythematous rash Ext: SCD Neuro: sedated on vent, maew  LABS:  CBC  Recent Labs Lab 11/06/14 0545 11/07/14 0350 11/09/14 0435  WBC 12.3* 13.5* 13.6*  HGB 11.8* 12.6* 11.8*  HCT 36.9* 38.7* 37.5*  PLT 385 436* 459*   BMET  Recent Labs Lab 11/07/14 0350 11/08/14 0430 11/09/14 0435  NA 134* 141 140  K 4.1 4.6 4.5  CL 101 105 107  CO2 BUN 31* 32* 28*  CREATININE 1.05 1.08 1.18  GLUCOSE 253* 137* 152*   Electrolytes  Recent Labs Lab 11/03/14 0443 11/04/14 0443  11/07/14 0350 11/08/14 0430 11/09/14 0435  CALCIUM 8.5 8.4  < > 8.2* 9.0 8.3*  MG 2.2 2.1  --  2.2  --   --   PHOS 3.4 3.1  --  3.5  --   --   < > = values in this interval not displayed. Sepsis Markers  Recent Labs Lab 11/05/14 0420  LATICACIDVEN 1.3   ABG  Recent Labs  Lab 11/03/14 0250  PHART 7.443  PCO2ART 45.2*  PO2ART 86.6   Liver Enzymes  Recent Labs Lab 11/04/14 0443 11/07/14 0350  AST 23 21  ALT 24 34  ALKPHOS 62 78  BILITOT 0.9 0.7  ALBUMIN 2.7* 2.9*   Glucose  Recent Labs Lab 11/08/14 0803 11/08/14 1203 11/08/14 1631 11/08/14 2005 11/08/14 2349 11/09/14 0344  GLUCAP 142* 127* 152* 183* 144* 126*    Imaging Dg Chest Port 1 View  11/09/2014   CLINICAL DATA:  Acute respiratory failure with hypoxia  EXAM: PORTABLE CHEST - 1 VIEW  COMPARISON:  11/08/2014  FINDINGS: Left upper extremity PICC, tracheostomy tube, and nasogastric tube are in stable, acceptable position. There is generalized worsening aeration. Unchanged bibasilar lung opacities, more dense and extensive on the left. By chest CT 10/27/2014 this is atelectasis, although pneumonia could have developed since that time. There is venous congestion due to lower lung volumes.  No pneumothorax.  IMPRESSION: 1. Worsening ventilation. 2. Left greater than right base pneumonia or atelectasis.   Electronically Signed   By: Tiburcio PeaJonathan  Watts M.D.   On: 11/09/2014 05:57   Dg Chest Port 1 View  11/08/2014   CLINICAL DATA:  Respiratory failure  EXAM: PORTABLE CHEST - 1 VIEW  COMPARISON:  November 07, 2014  FINDINGS: There is no a tracheostomy present. The tip of the tracheostomy catheter is 6.7 cm above carina. Nasogastric tube tip and side port are in the stomach. No pneumothorax.  There is patchy atelectasis in the right base. There is consolidation in the left base, stable. Lungs elsewhere clear. Heart is mildly enlarged with pulmonary vascularity within normal limits. No adenopathy. There is an azygos lobe on the right, an anatomic variant.  IMPRESSION: Tracheostomy now present as described. No pneumothorax. Persistent consolidation left base. Atelectasis right base. No change in cardiac silhouette.   Electronically Signed   By: Bretta BangWilliam  Woodruff M.D.   On: 11/08/2014 10:05   Dg Chest Port 1 View  11/07/2014   CLINICAL DATA:  Subsequent evaluation of acute respiratory failure with hypoxia  EXAM: PORTABLE CHEST - 1 VIEW  COMPARISON:  11/07/2014 at 5:04 a.m.  FINDINGS: No change in the position of orogastric or endotracheal tubes. No change in the appearance of moderate cardiac enlargement. Stable moderate to severe vascular congestion. Consolidation right lower lobe appears improved and at least partially due to atelectasis. More extensive consolidation in the left lower lobe with air bronchograms, stable.  IMPRESSION: 1. Stable cardiac enlargement and pulmonary venous congestion 2. Persistent but improved right lower lobe opacity suggesting atelectasis 3. Stable left lower lobe opacity representing atelectasis or pneumonia.   Electronically Signed   By: Esperanza Heiraymond  Rubner M.D.   On: 11/07/2014 10:55    ASSESSMENT / PLAN:  PULMONARY ETT 1/10 >> 1/16, 1/16 >> 1/22 Trach (Dr Jenne PaneBates) 1/22 >>   A: Acute respiratory failure with hypoxemia, worse 1/21> suspect HCAP, +/- pulm edema (net even hospitalization) Prolonged Vent course> tracheostomy 1/22 Hx of tobacco abuse with 8 mm RLL nodule on CT abd/pelvis Hx of OSA P:   Wean FiO2 / PEEP to keep SaO2 > 90%, remains on 0.50 + PEEP 8 Tracheostomy 1/22 See ID Continue guaifenesin, pulm toilette  CARDIOVASCULAR L femoral CVC 1/11 >> 1/18 RUE PICC 1/13 >> 1/18 (coiled) LUE PICC 1/18 >>  A: Septic shock 2nd to peritonitis >> resolved Demand ischemia > resolved HTN P:   Monitor hemodynamics Hold outpatient lisinopril Continue metoprolol 25mg  bid Hold diuresis 1/22 (+1.8L for admission)  RENAL A: Lactic Acidosis  - resolved P:   Monitor renal fx, urine outpt, electrolytes Daily KCL replacement  GASTROINTESTINAL A: Perforated Sigmoid s/p Partial Colectomy (1/10) Nutrition P:   Post-op care, nutrition per [facility] Protonix for SUP TNA / Lipids per Pharmacy > contributing to rash?? Would like to stop if able  HEMATOLOGIC A: Anemia of critical illness P: Intermittent CBC, transfuse if Hgb < 7gm/dL SQ heparin for DVT prevention  INFECTIOUS A: Septic shock 2nd to peritonitis from perforated sigmoid colon > resolved Left Maxillary Sinus Disease  HCAP 1/22 P:   7 days zosyn, micafungin > d/c'd 1/18  Restart Vanc/Zosyn 1/22 > plan 5 day course for HCAP, narrow as able per cultures  Blood Cx 1/11 >> neg Blood Cx 1/22 >> Resp Cx 1/22 >>  ENDOCRINE A: Hyperglycemia Hx of hypothyroidism P:   SSI  Continue synthroid IV  NEUROLOGIC A: Post-op pain control Agitation - CT head 1/17 neg for acute abnormality P: RASS Goal: 0 Supportive care:  Minimize sedation as able, promote sleep wake cycle, mobilize etc  D/c Propofol sedation (rash?) Fentanyl gtt  Family: Wife updated bedside 1/22  Today's Summary : Slow weaning, vent day 14, trach 1/22, covering HCAP  CC time 35 minutes  Levy Pupa, MD,  PhD 11/09/2014, 9:21 AM [facility] [phone] or if no answer [phone]

## 2014-11-09 NOTE — Progress Notes (Signed)
Patient ID: William [name], male   DOB: 12/14/1950, 64 y.o.   MRN: [id number] 1 Day Post-Op  Subjective: Stable.  No acute events.     Objective: Vital signs in last 24 hours: Temp:  [100.3 F (37.9 C)-102.9 F (39.4 C)] 101.9 F (38.8 C) (01/23 0800) Pulse Rate:  [98-110] 98 (01/23 0333) Resp:  [15-27] 18 (01/23 0800) BP: (94-166)/(43-85) 112/54 mmHg (01/23 0800) SpO2:  [86 %-98 %] 89 % (01/23 0800) FiO2 (%):  [50 %-60 %] 50 % (01/23 0800) Weight:  [279 lb (126.554 kg)] 279 lb (126.554 kg) (01/23 0444) Last BM Date: 11/09/14  . Marland KitchenTPN (CLINIMIX-E) Adult 40 mL/hr at 11/09/14 0600  . sodium chloride 30 mL/hr at 11/09/14 0600  . fentaNYL infusion INTRAVENOUS 200 mcg/hr (11/09/14 0800)   Intake/Output from previous day: 01/22 0701 - 01/23 0700 In: 3623 [I.V.:1498.7; NG/GT:765; IV Piggyback:400; TPN:959.3] Out: 2370 [Urine:2185; Stool:185] Intake/Output this shift: Total I/O In: 145 [I.V.:50; NG/GT:55; TPN:40] Out: 100 [Urine:100]  General appearance: sleepy, no distress Resp: breathing comfortably GI: Wound vac in place, he has stool coming from the ostomy.     Lab Results:   Recent Labs  11/07/14 0350 11/09/14 0435  WBC 13.5* 13.6*  HGB 12.6* 11.8*  HCT 38.7* 37.5*  PLT 436* 459*    BMET  Recent Labs  11/08/14 0430 11/09/14 0435  NA 141 140  K 4.6 4.5  CL 105 107  CO2 29 28  GLUCOSE 137* 152*  BUN 32* 28*  CREATININE 1.08 1.18  CALCIUM 9.0 8.3*   PT/INR No results for input(s): LABPROT, INR in the last 72 hours.   Recent Labs Lab 11/04/14 0443 11/07/14 0350  AST 23 21  ALT 24 34  ALKPHOS 62 78  BILITOT 0.9 0.7  PROT 6.3 6.3  ALBUMIN 2.7* 2.9*     Lipase     Component Value Date/Time   LIPASE 23 10/27/2014 1810     Studies/Results: Dg Chest Port 1 View  11/09/2014   CLINICAL DATA:  Acute respiratory failure with hypoxia  EXAM: PORTABLE CHEST - 1 VIEW  COMPARISON:  11/08/2014  FINDINGS: Left upper extremity PICC, tracheostomy tube, and  nasogastric tube are in stable, acceptable position. There is generalized worsening aeration. Unchanged bibasilar lung opacities, more dense and extensive on the left. By chest CT 10/27/2014 this is atelectasis, although pneumonia could have developed since that time. There is venous congestion due to lower lung volumes. No pneumothorax.  IMPRESSION: 1. Worsening ventilation. 2. Left greater than right base pneumonia or atelectasis.   Electronically Signed   By: Tiburcio Pea M.D.   On: 11/09/2014 05:57   Dg Chest Port 1 View  11/08/2014   CLINICAL DATA:  Respiratory failure  EXAM: PORTABLE CHEST - 1 VIEW  COMPARISON:  November 07, 2014  FINDINGS: There is no a tracheostomy present. The tip of the tracheostomy catheter is 6.7 cm above carina. Nasogastric tube tip and side port are in the stomach. No pneumothorax.  There is patchy atelectasis in the right base. There is consolidation in the left base, stable. Lungs elsewhere clear. Heart is mildly enlarged with pulmonary vascularity within normal limits. No adenopathy. There is an azygos lobe on the right, an anatomic variant.  IMPRESSION: Tracheostomy now present as described. No pneumothorax. Persistent consolidation left base. Atelectasis right base. No change in cardiac silhouette.   Electronically Signed   By: Bretta Bang M.D.   On: 11/08/2014 10:05   Dg Chest Port 1 View  11/07/2014  CLINICAL DATA:  Subsequent evaluation of acute respiratory failure with hypoxia  EXAM: PORTABLE CHEST - 1 VIEW  COMPARISON:  11/07/2014 at 5:04 a.m.  FINDINGS: No change in the position of orogastric or endotracheal tubes. No change in the appearance of moderate cardiac enlargement. Stable moderate to severe vascular congestion. Consolidation right lower lobe appears improved and at least partially due to atelectasis. More extensive consolidation in the left lower lobe with air bronchograms, stable.  IMPRESSION: 1. Stable cardiac enlargement and pulmonary venous  congestion 2. Persistent but improved right lower lobe opacity suggesting atelectasis 3. Stable left lower lobe opacity representing atelectasis or pneumonia.   Electronically Signed   By: Esperanza Heiraymond  Rubner M.D.   On: 11/07/2014 10:55    Medications: . antiseptic oral rinse  7 mL Mouth Rinse QID  . bacitracin   Topical BID  . chlorhexidine  15 mL Mouth Rinse BID  . feeding supplement (PRO-STAT SUGAR FREE 64)  30 mL Oral BID  . feeding supplement (VITAL HIGH PROTEIN)  1,000 mL Per Tube Q24H  . guaiFENesin  10 mL Per Tube BID  . heparin  5,000 Units Subcutaneous 3 times per day  . insulin aspart  0-20 Units Subcutaneous 6 times per day  . insulin glargine  15 Units Subcutaneous Daily  . levothyroxine  75 mcg Intravenous Daily  . lip balm  1 application Topical BID  . metoprolol tartrate  25 mg Per Tube BID  . nystatin   Topical BID  . pantoprazole (PROTONIX) IV  40 mg Intravenous Q24H  . piperacillin-tazobactam (ZOSYN)  IV  3.375 g Intravenous 3 times per day  . sodium chloride  10-40 mL Intracatheter Q12H  . vancomycin  1,250 mg Intravenous Q12H    Assessment/Plan 1. Sigmoid diverticulitis, Perforated Bowel EXPLORATORY LAPAROTOMY, PARTIAL COLECTOMY Sigmoid, COLOSTOMY,  Repair umbilical hernia, 10/28/2014, Chevis PrettyPaul Toth III, MD.  2. Sepsis with hypotension 3.Ventilator dependant respiratory failure/EXTUBATED/RE intubated 11/02/14  4. Hx of hypertension, now requiring pressors 5. Hypothyroid TSH 16 6. Hx of tobacco use 30 years (quit 9 years ago) 7. Body mass index is 38.51 kg  8. Hx of nephrolithiasis 9. Heparin for DVT prophylaxis 10. Malnutrition/TNA, going to see how he does with some TF. Prealbumin 15.9   Plan:  Tolerating tube feeds.      LOS: 13 days    Haide Klinker 11/09/2014

## 2014-11-10 DIAGNOSIS — G4730 Sleep apnea, unspecified: Secondary | ICD-10-CM

## 2014-11-10 LAB — BASIC METABOLIC PANEL
ANION GAP: 6 (ref 5–15)
Anion gap: 6 (ref 5–15)
BUN: 30 mg/dL — ABNORMAL HIGH (ref 6–23)
BUN: 32 mg/dL — AB (ref 6–23)
CALCIUM: 8.3 mg/dL — AB (ref 8.4–10.5)
CO2: 26 mmol/L (ref 19–32)
CO2: 27 mmol/L (ref 19–32)
CREATININE: 1.11 mg/dL (ref 0.50–1.35)
CREATININE: 1.23 mg/dL (ref 0.50–1.35)
Calcium: 8.2 mg/dL — ABNORMAL LOW (ref 8.4–10.5)
Chloride: 108 mmol/L (ref 96–112)
Chloride: 107 mmol/L (ref 96–112)
GFR calc non Af Amer: 62 mL/min — ABNORMAL LOW (ref >90)
GFR, EST AFRICAN AMERICAN: 81 mL/min — AB (ref >90)
GFR, EST AFRICAN AMERICAN: 72 mL/min — AB (ref >90)
GFR, EST NON AFRICAN AMERICAN: 70 mL/min — AB (ref >90)
GLUCOSE: 153 mg/dL — AB (ref 70–99)
Glucose, Bld: 155 mg/dL — ABNORMAL HIGH (ref 70–99)
POTASSIUM: 3.8 mmol/L (ref 3.5–5.1)
POTASSIUM: 3.7 mmol/L (ref 3.5–5.1)
Sodium: 140 mmol/L (ref 135–145)
Sodium: 140 mmol/L (ref 135–145)

## 2014-11-10 LAB — GLUCOSE, CAPILLARY
GLUCOSE-CAPILLARY: 124 mg/dL — AB (ref 70–99)
Glucose-Capillary: 111 mg/dL — ABNORMAL HIGH (ref 70–99)
Glucose-Capillary: 145 mg/dL — ABNORMAL HIGH (ref 70–99)

## 2014-11-10 LAB — VANCOMYCIN, TROUGH: Vancomycin Tr: 15.1 ug/mL (ref 10.0–20.0)

## 2014-11-10 MED ORDER — POTASSIUM CHLORIDE 20 MEQ PO PACK: 40.0000 meq | PACK | Freq: Once | ORAL | Status: DC

## 2014-11-10 MED ORDER — DIPHENHYDRAMINE HCL 25 MG PO CAPS: 25.0000 mg | ORAL_CAPSULE | Freq: Once | ORAL | Status: DC

## 2014-11-10 MED ORDER — FUROSEMIDE 10 MG/ML IJ SOLN
INTRAMUSCULAR | Status: AC
  Filled 2014-11-10: qty 4

## 2014-11-10 MED ORDER — POTASSIUM CHLORIDE 20 MEQ/15ML (10%) PO SOLN
40.0000 meq | Freq: Once | ORAL | Status: AC
  Administered 2014-11-10: 40 meq via ORAL
  Filled 2014-11-10: qty 30

## 2014-11-10 MED ORDER — DIPHENHYDRAMINE HCL 12.5 MG/5ML PO ELIX
25.0000 mg | ORAL_SOLUTION | Freq: Once | ORAL | Status: AC
  Administered 2014-11-10: 25 mg
  Filled 2014-11-10: qty 10

## 2014-11-10 MED ORDER — ACETAMINOPHEN 160 MG/5ML PO SOLN
650.0000 mg | ORAL | Status: DC | PRN
  Administered 2014-11-10 – 2014-11-14 (×10): 650 mg
  Filled 2014-11-10 (×9): qty 20.3

## 2014-11-10 MED ORDER — FUROSEMIDE 10 MG/ML IJ SOLN
40.0000 mg | Freq: Two times a day (BID) | INTRAMUSCULAR | Status: AC
  Administered 2014-11-10 (×2): 40 mg via INTRAVENOUS
  Filled 2014-11-10: qty 4

## 2014-11-10 MED ORDER — CIPROFLOXACIN IN D5W 400 MG/200ML IV SOLN
400.0000 mg | Freq: Two times a day (BID) | INTRAVENOUS | Status: DC
  Administered 2014-11-10 – 2014-11-12 (×5): 400 mg via INTRAVENOUS
  Filled 2014-11-10 (×5): qty 200

## 2014-11-10 NOTE — Progress Notes (Signed)
Patient ID: William [name], male   DOB: 09/08/1951, 64 y.o.   MRN: [id number] 2 Days Post-Op  Subjective: Stable.  No acute events.  Was not able to wean much since peep still at 8.     Objective: Vital signs in last 24 hours: Temp:  [99.1 F (37.3 C)-102.3 F (39.1 C)] 102.3 F (39.1 C) (01/24 0800) Pulse Rate:  [97-110] 103 (01/24 0824) Resp:  [15-22] 18 (01/24 0824) BP: (91-137)/(47-75) 113/62 mmHg (01/24 0800) SpO2:  [88 %-99 %] 96 % (01/24 0824) FiO2 (%):  [40 %-50 %] 40 % (01/24 0824) Weight:  [262 lb (118.842 kg)] 262 lb (118.842 kg) (01/24 0454) Last BM Date: 11/10/14  . sodium chloride 10 mL/hr at 11/10/14 0600  . fentaNYL infusion INTRAVENOUS 250 mcg/hr (11/10/14 0600)   Intake/Output from previous day: 01/23 0701 - 01/24 0700 In: 3875 [I.V.:1045; NG/GT:2020; IV Piggyback:650; TPN:160] Out: 2600 [Urine:2100; Stool:500] Intake/Output this shift: Total I/O In: 185 [I.V.:50; NG/GT:135] Out: 150 [Urine:150]  General appearance: sleepy, no distress Resp: breathing comfortably GI: Wound vac in place, he has stool coming from the ostomy.     Lab Results:   Recent Labs  11/09/14 0435  WBC 13.6*  HGB 11.8*  HCT 37.5*  PLT 459*    BMET  Recent Labs  11/08/14 0430 11/09/14 0435  NA 141 140  K 4.6 4.5  CL 105 107  CO2 29 28  GLUCOSE 137* 152*  BUN 32* 28*  CREATININE 1.08 1.18  CALCIUM 9.0 8.3*   PT/INR No results for input(s): LABPROT, INR in the last 72 hours.   Recent Labs Lab 11/04/14 0443 11/07/14 0350  AST 23 21  ALT 24 34  ALKPHOS 62 78  BILITOT 0.9 0.7  PROT 6.3 6.3  ALBUMIN 2.7* 2.9*     Lipase     Component Value Date/Time   LIPASE 23 10/27/2014 1810     Studies/Results: Dg Chest Port 1 View  11/09/2014   CLINICAL DATA:  Acute respiratory failure with hypoxia  EXAM: PORTABLE CHEST - 1 VIEW  COMPARISON:  11/08/2014  FINDINGS: Left upper extremity PICC, tracheostomy tube, and nasogastric tube are in stable, acceptable  position. There is generalized worsening aeration. Unchanged bibasilar lung opacities, more dense and extensive on the left. By chest CT 10/27/2014 this is atelectasis, although pneumonia could have developed since that time. There is venous congestion due to lower lung volumes. No pneumothorax.  IMPRESSION: 1. Worsening ventilation. 2. Left greater than right base pneumonia or atelectasis.   Electronically Signed   By: Tiburcio PeaJonathan  Watts M.D.   On: 11/09/2014 05:57   Dg Chest Port 1 View  11/08/2014   CLINICAL DATA:  Respiratory failure  EXAM: PORTABLE CHEST - 1 VIEW  COMPARISON:  November 07, 2014  FINDINGS: There is no a tracheostomy present. The tip of the tracheostomy catheter is 6.7 cm above carina. Nasogastric tube tip and side port are in the stomach. No pneumothorax.  There is patchy atelectasis in the right base. There is consolidation in the left base, stable. Lungs elsewhere clear. Heart is mildly enlarged with pulmonary vascularity within normal limits. No adenopathy. There is an azygos lobe on the right, an anatomic variant.  IMPRESSION: Tracheostomy now present as described. No pneumothorax. Persistent consolidation left base. Atelectasis right base. No change in cardiac silhouette.   Electronically Signed   By: Bretta BangWilliam  Woodruff M.D.   On: 11/08/2014 10:05    Medications: . antiseptic oral rinse  7 mL Mouth Rinse QID  .  bacitracin   Topical BID  . chlorhexidine  15 mL Mouth Rinse BID  . ciprofloxacin  400 mg Intravenous Q12H  . feeding supplement (PRO-STAT SUGAR FREE 64)  30 mL Oral BID  . feeding supplement (VITAL HIGH PROTEIN)  1,000 mL Per Tube Q24H  . fluconazole  100 mg Oral Daily  . guaiFENesin  10 mL Per Tube BID  . heparin  5,000 Units Subcutaneous 3 times per day  . insulin aspart  0-20 Units Subcutaneous 6 times per day  . insulin glargine  15 Units Subcutaneous Daily  . levothyroxine  75 mcg Intravenous Daily  . lip balm  1 application Topical BID  . metoprolol tartrate  25  mg Per Tube BID  . nystatin   Topical BID  . pantoprazole (PROTONIX) IV  40 mg Intravenous Q24H  . piperacillin-tazobactam (ZOSYN)  IV  3.375 g Intravenous 3 times per day  . sodium chloride  10-40 mL Intracatheter Q12H  . vancomycin  1,250 mg Intravenous Q12H    Assessment/Plan 1. Sigmoid diverticulitis, Perforated Bowel EXPLORATORY LAPAROTOMY, PARTIAL COLECTOMY Sigmoid, COLOSTOMY,  Repair umbilical hernia, 10/28/2014, [name], MD.  2. Sepsis with hypotension 3.Ventilator dependant respiratory failure/EXTUBATED/RE intubated 11/02/14  4. Hx of hypertension, now requiring pressors 5. Hypothyroid TSH 16 6. Hx of tobacco use 30 years (quit 9 years ago) 7. Body mass index is 38.51 kg  8. Hx of nephrolithiasis 9. Heparin for DVT prophylaxis 10. Malnutrition/TNA, going to see how he does with some TF. Prealbumin 15.9   Plan:  Tolerating tube feeds.  Continue.  Given low residuals, tolerance of feeds, exam, and stooling, would not scan abdomen at this time.     LOS: 14 days    Dakota Stangl 11/10/2014

## 2014-11-10 NOTE — Progress Notes (Signed)
RAsh   Plan Benadryl x 1  Dr. Kalman ShanMurali Graysin Luczynski, M.D., [facility] Pulmonary and Critical Care Medicine Staff Physician [facility] Pager: [phone], If no answer or between  15:00h - 7:00h: call [phone]  11/10/2014 9:03 PM

## 2014-11-10 NOTE — Progress Notes (Signed)
RN concerned rash is worse despite benadryl  On camera exam Rt side body - urticarial rash  A Suspect abx relaetd urticaria  P Dc zosyn Continue cipro Await ID on GNR in blood  Dr. Kalman ShanMurali Annai Heick, M.D., [facility]C.P Pulmonary and Critical Care Medicine Staff Physician [facility] Pager: [phone], If no answer or between  15:00h - 7:00h: call [phone]  11/10/2014 10:46 PM

## 2014-11-10 NOTE — Progress Notes (Signed)
PULMONARY / CRITICAL CARE MEDICINE   Name: William [name] MRN: [id number] DOB: 09/17/1951    ADMISSION DATE:  10/27/2014  CHIEF COMPLAINT:  Perforated sigmoid colon  BRIEF PATIENT DESCRIPTION:   64 y/o male former smoker presented with abd pain from perforated sigmoid colon taken to OR emergently.  Remained on vent post op.  Failed extubation 1/16, reintubated.    SIGNIFICANT EVENTS:  1/10  Admit, Partial colectomy  1/13  Off pressors 1/14  On TNA 1/16  Extubated >> reintubated in PM 1/19  Agitation, hypertension overnight, sedation changed to propofol. 60% / 8 1/20 PEEP weaned to 5, FiO2 40%, diuresing 1/22 more hypoxemic, febrile, plan tracheostomy  STUDIES: 1/10  CT abd/pelvis >> sigmoid colon diverticulitis with perforation, umbilical hernia, b/l renal stones, 8 mm RLL nodule 1/11  ECHO >> EF 55-60%, nml systolic fxn, PA peak 32   1/17  CT Head >> no acute intracranial abnormality, ? Small vessel ischemic changes, L maxillary sinus disease  SUBJECTIVE:   Continues to have fever Fluconazole added 1/23 for thrush and skin yeast PEEP to 5, FiO2 to 40% GNR bacteremia reported   VITAL SIGNS: Temp:  [99.1 F (37.3 C)-102.3 F (39.1 C)] 102.3 F (39.1 C) (01/24 0800) Pulse Rate:  [97-110] 103 (01/24 0824) Resp:  [15-22] 18 (01/24 0824) BP: (91-137)/(47-75) 113/62 mmHg (01/24 0800) SpO2:  [88 %-99 %] 96 % (01/24 0824) FiO2 (%):  [40 %-50 %] 40 % (01/24 0824) Weight:  [118.842 kg (262 lb)] 118.842 kg (262 lb) (01/24 0454)   VENTILATOR SETTINGS: Vent Mode:  [-] PRVC FiO2 (%):  [40 %-50 %] 40 % Set Rate:  [18 bmp] 18 bmp Vt Set:  [610 mL] 610 mL PEEP:  [5 cmH20-8 cmH20] 5 cmH20 Plateau Pressure:  [14 cmH20-18 cmH20] 14 cmH20   INTAKE / OUTPUT: Intake/Output      01/23 0701 - 01/24 0700 01/24 0701 - 01/25 0700   I.V. (mL/kg) 1045 (8.8) 50 (0.4)   NG/GT 2020 135   IV Piggyback 650    TPN 160    Total Intake(mL/kg) 3875 (32.6) 185 (1.6)   Urine (mL/kg/hr) 2100 (0.7)  150 (0.4)   Emesis/NG output 0 (0)    Drains 0 (0)    Stool 500 (0.2)    Total Output 2600 150   Net +1275 +35          PHYSICAL EXAMINATION:  Gen: sedated on vent HEENT: NCAT EOMi, ETT PULM:  rhonchi bases CV: tachy, no mgr AB: infrequent bowel sounds, midline scar Derm: macular erythematous rash Ext: SCD Neuro: sedated on vent, grimace with stim  LABS:  CBC  Recent Labs Lab 11/06/14 0545 11/07/14 0350 11/09/14 0435  WBC 12.3* 13.5* 13.6*  HGB 11.8* 12.6* 11.8*  HCT 36.9* 38.7* 37.5*  PLT 385 436* 459*   BMET  Recent Labs Lab 11/07/14 0350 11/08/14 0430 11/09/14 0435  NA 134* 141 140  K 4.1 4.6 4.5  CL 101 105 107  CO2 27 29 28   BUN 31* 32* 28*  CREATININE 1.05 1.08 1.18  GLUCOSE 253* 137* 152*   Electrolytes  Recent Labs Lab 11/04/14 0443  11/07/14 0350 11/08/14 0430 11/09/14 0435  CALCIUM 8.4  < > 8.2* 9.0 8.3*  MG 2.1  --  2.2  --   --   PHOS 3.1  --  3.5  --   --   < > = values in this interval not displayed. Sepsis Markers  Recent Labs Lab 11/05/14 0420  LATICACIDVEN  1.3   ABG No results for input(s): PHART, PCO2ART, PO2ART in the last 168 hours. Liver Enzymes  Recent Labs Lab 11/04/14 0443 11/07/14 0350  AST 23 21  ALT 24 34  ALKPHOS 62 78  BILITOT 0.9 0.7  ALBUMIN 2.7* 2.9*   Glucose  Recent Labs Lab 11/09/14 1209 11/09/14 1602 11/09/14 2049 11/10/14 0101 11/10/14 0514 11/10/14 0805  GLUCAP 159* 120* 142* 124* 111* 145*    Imaging Dg Chest Port 1 View  11/09/2014   CLINICAL DATA:  Acute respiratory failure with hypoxia  EXAM: PORTABLE CHEST - 1 VIEW  COMPARISON:  11/08/2014  FINDINGS: Left upper extremity PICC, tracheostomy tube, and nasogastric tube are in stable, acceptable position. There is generalized worsening aeration. Unchanged bibasilar lung opacities, more dense and extensive on the left. By chest CT 10/27/2014 this is atelectasis, although pneumonia could have developed since that time. There is venous  congestion due to lower lung volumes. No pneumothorax.  IMPRESSION: 1. Worsening ventilation. 2. Left greater than right base pneumonia or atelectasis.   Electronically Signed   By: Tiburcio Pea M.D.   On: 11/09/2014 05:57   Dg Chest Port 1 View  11/08/2014   CLINICAL DATA:  Respiratory failure  EXAM: PORTABLE CHEST - 1 VIEW  COMPARISON:  November 07, 2014  FINDINGS: There is no a tracheostomy present. The tip of the tracheostomy catheter is 6.7 cm above carina. Nasogastric tube tip and side port are in the stomach. No pneumothorax.  There is patchy atelectasis in the right base. There is consolidation in the left base, stable. Lungs elsewhere clear. Heart is mildly enlarged with pulmonary vascularity within normal limits. No adenopathy. There is an azygos lobe on the right, an anatomic variant.  IMPRESSION: Tracheostomy now present as described. No pneumothorax. Persistent consolidation left base. Atelectasis right base. No change in cardiac silhouette.   Electronically Signed   By: Bretta Bang M.D.   On: 11/08/2014 10:05    ASSESSMENT / PLAN:  PULMONARY ETT 1/10 >> 1/16, 1/16 >> 1/22 Trach (Dr Jenne Pane) 1/22 >>  A: Acute respiratory failure with hypoxemia, worse 1/21> suspect HCAP, +/- pulm edema (net even hospitalization) Prolonged Vent course> tracheostomy 1/22 Hx of tobacco abuse with 8 mm RLL nodule on CT abd/pelvis Hx of OSA P:   Wean FiO2 / PEEP to keep SaO2 > 90%, goal to PSv now that FiO2 and PEEP improved Tracheostomy 1/22 See ID Continue guaifenesin, pulm toilette  CARDIOVASCULAR L femoral CVC 1/11 >> 1/18 RUE PICC 1/13 >> 1/18 (coiled) LUE PICC 1/18 >>  A: Septic shock 2nd to peritonitis >> resolved Demand ischemia > resolved HTN P:   Monitor hemodynamics Hold outpatient lisinopril Continue metoprolol  bid Restart diuresis 1/24 (+3L for admission), dosing daily  RENAL A: Lactic Acidosis  - resolved P:   Monitor renal fx, urine outpt, electrolytes Daily  KCL replacement Restart diuresis 1/24 and dose daily based on I/O and resp status  GASTROINTESTINAL A: Perforated Sigmoid s/p Partial Colectomy (1/10) Nutrition P:   Post-op care, nutrition per CCS Protonix for SUP TNA / Lipids stopped 1/24  HEMATOLOGIC A: Anemia of critical illness P: Intermittent CBC, transfuse if Hgb < 7gm/dL SQ heparin for DVT prevention  INFECTIOUS A: Septic shock 2nd to peritonitis from perforated sigmoid colon, GNR bacteremia Left Maxillary Sinus Disease  HCAP 1/22 P:   7 days zosyn, micafungin > d/c'd 1/18 Vanco 1/22 >> 1/24 Zosyn 1/22 >>  cipro 1/24 >>   Vanc/Zosyn 1/22, Cipro added 1/24  pending GNR speciation and sensitivities > plan 10 day course for GNR bacteremia +/- HCAP D/c vanco 1/24  Blood Cx 1/11 >> neg Blood Cx 1/22 >> GNR >>  Resp Cx 1/22 >>  ENDOCRINE A: Hyperglycemia Hx of hypothyroidism P:   SSI  Continue synthroid IV  NEUROLOGIC A: Post-op pain control Agitation - CT head 1/17 neg for acute abnormality P: RASS Goal: 0 Supportive care:  Minimize sedation as able, promote sleep wake cycle, mobilize etc  Fentanyl gtt  Family: Wife updated bedside 1/24  Today's Summary : Slow weaning, vent day 15, trach 1/22, covering GNR bacteremia  CC time 35 minutes  Levy Pupa, MD, PhD 11/10/2014, 9:57 AM [facility] [phone] or if no answer [phone]

## 2014-11-11 ENCOUNTER — Inpatient Hospital Stay (HOSPITAL_COMMUNITY): Payer: 59

## 2014-11-11 ENCOUNTER — Encounter (HOSPITAL_COMMUNITY): Payer: Self-pay | Admitting: Radiology

## 2014-11-11 DIAGNOSIS — A419 Sepsis, unspecified organism: Secondary | ICD-10-CM

## 2014-11-11 DIAGNOSIS — Z930 Tracheostomy status: Secondary | ICD-10-CM

## 2014-11-11 LAB — GLUCOSE, CAPILLARY
GLUCOSE-CAPILLARY: 118 mg/dL — AB (ref 70–99)
GLUCOSE-CAPILLARY: 129 mg/dL — AB (ref 70–99)
GLUCOSE-CAPILLARY: 134 mg/dL — AB (ref 70–99)
GLUCOSE-CAPILLARY: 99 mg/dL (ref 70–99)
Glucose-Capillary: 116 mg/dL — ABNORMAL HIGH (ref 70–99)
Glucose-Capillary: 147 mg/dL — ABNORMAL HIGH (ref 70–99)
Glucose-Capillary: 139 mg/dL — ABNORMAL HIGH (ref 70–99)
Glucose-Capillary: 122 mg/dL — ABNORMAL HIGH (ref 70–99)
Glucose-Capillary: 97 mg/dL (ref 70–99)

## 2014-11-11 LAB — MAGNESIUM: MAGNESIUM: 2 mg/dL (ref 1.5–2.5)

## 2014-11-11 LAB — CULTURE, RESPIRATORY W GRAM STAIN

## 2014-11-11 LAB — CBC
HCT: 35.9 % — ABNORMAL LOW (ref 39.0–52.0)
Hemoglobin: 11.3 g/dL — ABNORMAL LOW (ref 13.0–17.0)
MCH: 30.9 pg (ref 26.0–34.0)
MCHC: 31.5 g/dL (ref 30.0–36.0)
MCV: 98.1 fL (ref 78.0–100.0)
Platelets: 467 10*3/uL — ABNORMAL HIGH (ref 150–400)
RBC: 3.66 MIL/uL — ABNORMAL LOW (ref 4.22–5.81)
RDW: 14.7 % (ref 11.5–15.5)
WBC: 7.7 10*3/uL (ref 4.0–10.5)

## 2014-11-11 LAB — DIFFERENTIAL
BASOS ABS: 0 10*3/uL (ref 0.0–0.1)
BASOS PCT: 0 % (ref 0–1)
EOS ABS: 0.3 10*3/uL (ref 0.0–0.7)
Eosinophils Relative: 4 % (ref 0–5)
LYMPHS PCT: 15 % (ref 12–46)
Lymphs Abs: 1.2 10*3/uL (ref 0.7–4.0)
Monocytes Absolute: 0.9 10*3/uL (ref 0.1–1.0)
Monocytes Relative: 12 % (ref 3–12)
Neutro Abs: 5.3 10*3/uL (ref 1.7–7.7)
Neutrophils Relative %: 69 % (ref 43–77)

## 2014-11-11 LAB — COMPREHENSIVE METABOLIC PANEL
ALBUMIN: 2.6 g/dL — AB (ref 3.5–5.2)
ALK PHOS: 96 U/L (ref 39–117)
ALT: 43 U/L (ref 0–53)
ANION GAP: 11 (ref 5–15)
AST: 34 U/L (ref 0–37)
BUN: 30 mg/dL — AB (ref 6–23)
CALCIUM: 8.2 mg/dL — AB (ref 8.4–10.5)
CO2: 27 mmol/L (ref 19–32)
Chloride: 105 mmol/L (ref 96–112)
Creatinine, Ser: 1.04 mg/dL (ref 0.50–1.35)
GFR calc non Af Amer: 76 mL/min — ABNORMAL LOW (ref >90)
GFR, EST AFRICAN AMERICAN: 88 mL/min — AB (ref >90)
GLUCOSE: 129 mg/dL — AB (ref 70–99)
Potassium: 3.3 mmol/L — ABNORMAL LOW (ref 3.5–5.1)
Sodium: 143 mmol/L (ref 135–145)
Total Bilirubin: 0.4 mg/dL (ref 0.3–1.2)
Total Protein: 6.1 g/dL (ref 6.0–8.3)

## 2014-11-11 LAB — PHOSPHORUS: PHOSPHORUS: 3.6 mg/dL (ref 2.3–4.6)

## 2014-11-11 LAB — CULTURE, RESPIRATORY: SPECIAL REQUESTS: NORMAL

## 2014-11-11 LAB — TRIGLYCERIDES: Triglycerides: 147 mg/dL (ref <150)

## 2014-11-11 LAB — PREALBUMIN: Prealbumin: 14 mg/dL — ABNORMAL LOW (ref 17.0–34.0)

## 2014-11-11 MED ORDER — PANTOPRAZOLE SODIUM 40 MG PO PACK
40.0000 mg | PACK | Freq: Every day | ORAL | Status: DC
  Administered 2014-11-11 – 2014-11-16 (×6): 40 mg
  Filled 2014-11-11 (×9): qty 20

## 2014-11-11 MED ORDER — IOHEXOL 300 MG/ML  SOLN
100.0000 mL | Freq: Once | INTRAMUSCULAR | Status: AC | PRN
  Administered 2014-11-11: 100 mL via INTRAVENOUS

## 2014-11-11 MED ORDER — VITAL HIGH PROTEIN PO LIQD
1000.0000 mL | ORAL | Status: DC
  Administered 2014-11-12 – 2014-11-16 (×6): 1000 mL
  Filled 2014-11-11 (×11): qty 1000

## 2014-11-11 MED ORDER — LEVOTHYROXINE SODIUM 75 MCG PO TABS
150.0000 ug | ORAL_TABLET | Freq: Every day | ORAL | Status: DC
  Administered 2014-11-12 – 2014-11-17 (×6): 150 ug
  Filled 2014-11-11 (×7): qty 2

## 2014-11-11 MED ORDER — VANCOMYCIN HCL 10 G IV SOLR
1250.0000 mg | Freq: Once | INTRAVENOUS | Status: AC
  Administered 2014-11-11: 1250 mg via INTRAVENOUS
  Filled 2014-11-11: qty 1250

## 2014-11-11 MED ORDER — IOHEXOL 300 MG/ML  SOLN
25.0000 mL | INTRAMUSCULAR | Status: AC
  Administered 2014-11-11 (×2): 25 mL via ORAL

## 2014-11-11 MED ORDER — DIPHENHYDRAMINE HCL 50 MG/ML IJ SOLN
25.0000 mg | Freq: Three times a day (TID) | INTRAMUSCULAR | Status: DC | PRN
  Administered 2014-11-11 – 2014-11-14 (×5): 25 mg via INTRAVENOUS
  Filled 2014-11-11 (×5): qty 1

## 2014-11-11 MED ORDER — POTASSIUM CHLORIDE 20 MEQ/15ML (10%) PO SOLN
40.0000 meq | Freq: Once | ORAL | Status: AC
  Administered 2014-11-11: 40 meq via ORAL
  Filled 2014-11-11: qty 30

## 2014-11-11 MED ORDER — VANCOMYCIN HCL 10 G IV SOLR
1250.0000 mg | Freq: Two times a day (BID) | INTRAVENOUS | Status: DC
  Administered 2014-11-11 – 2014-11-12 (×2): 1250 mg via INTRAVENOUS
  Filled 2014-11-11 (×3): qty 1250

## 2014-11-11 NOTE — Progress Notes (Signed)
CARE MANAGEMENT NOTE 11/11/2014  Patient:  Johnney OuJENNINGS,Ahsan   Account Number:  [id number]  Date Initiated:  10/29/2014  Documentation initiated by:  Ferdinand CavaSCHETTINO,ANDREA  Subjective/Objective Assessment:   64 yo male admitted with diveticulitis of colon with perforation     Action/Plan:   discharge planning   Anticipated DC Date:  11/14/2014   Anticipated DC Plan:        DC Planning Services  CM consult      Choice offered to / List presented to:             Status of service:  In process, will continue to follow Medicare Important Message given?   (If response is "NO", the following Medicare IM given date fields will be blank) Date Medicare IM given:   Medicare IM given by:   Date Additional Medicare IM given:   Additional Medicare IM given by:    Discharge Disposition:    Per UR Regulation:    If discussed at Long Length of Stay Meetings, dates discussed:    Comments:  [id number]/Rhonda Davis,RN,BSN,CCM: Pt remains on vent /failed attempts to wean/tracheostomy planned for next few days.  10/29/14 Ferdinand CavaAndrea Schettino RN BSN CM [phone] CM will continue to follow for any discharge planning needs

## 2014-11-11 NOTE — Progress Notes (Signed)
NUTRITION FOLLOW UP  Intervention:    -Continue Vital HP at goal rate of 75 ml/hr.   Goal tube feeding regimen provides 1800 kcal (69% of needs), 158 grams of protein (99% of estimated needs, and 1505 ml of H2O.   -D/C Prostat -Diet advancement per MD -RD to continue to monitor  Nutrition Dx:   Inadequate oral intake related to inability to eat as evidenced by NPO; ongoing  Goal:   Parenteral/Enteral nutrition to provide 60-70% of estimated calorie needs (22-25 kcals/kg ideal body weight) and 100% of estimated protein needs, based on ASPEN guidelines for hypocaloric, high protein feeding in critically ill obese individuals  Monitor:   TF tolerance, GI profile, weight trend, vent status/settings, labs  Assessment:   64 y/o male former smoker presented with abd pain from perforated sigmoid colon taken to OR emergently. Remained on vent post op. Failed extubation 1/16, reintubated.   1/10 Admit, Partial colectomy 1/16 extubated, reintubated in pm 1/22 tracheostomy 1/24 TPN stopped  - TF currently held for contrast to be given for CT. RN to re-start afterwards. TF tolerated at goal rate with minimal residuals (30-40 mL).69 - Pt with rash. Given Benadryl.  - Propofol d/c'd  - Prostat d/c'd as pt is receiving adequate protein from TF.   Patient is currently intubated on ventilator support MV: 14.2 L/min Temp (24hrs), Avg:101.3 F (38.5 C), Min:100.6 F (38.1 C), Max:102.6 F (39.2 C)  Propofol: none  Labs: Na WNL K low BUN elevated Phos and Mg WNL  Height: Ht Readings from Last 1 Encounters:  10/28/14 5' 11.5" (1.816 m)    Weight Status:   Wt Readings from Last 1 Encounters:  11/11/14 269 lb (122.018 kg)  11/06/14 275 lb 10/30/14 293 lb 10/27/14 280 lb  Re-estimated needs(1/21):  Kcal: 2617 (1800-2000 kcal = 22-25 kcal/kg IBW) Protein:~160 gram protein Fluid: per MD  Skin:  Surgical incision on abd, +1 generalized edema, +1 perineal edema, +1 RLE, +1  LLE edema  Diet Order: Diet NPO time specified   Intake/Output Summary (Last 24 hours) at 11/11/14 1107 Last data filed at 11/11/14 1000  Gross per 24 hour  Intake 2947.71 ml  Output   5740 ml  Net -2792.29 ml    Last BM: 1/25- 900 mL output from colostomy  Labs:   Recent Labs Lab 11/07/14 0350  11/10/14 0900 11/10/14 1736 11/11/14 0450  NA 134*  < > 140 140 143  K 4.1  < > 3.8 3.7 3.3*  CL 101  < > 108 107 105  CO2 27  < > BUN 31*  < > 30* 32* 30*  CREATININE 1.05  < > 1.11 1.23 1.04  CALCIUM 8.2*  < > 8.3* 8.2* 8.2*  MG 2.2  --   --   --  2.0  PHOS 3.5  --   --   --  3.6  GLUCOSE 253*  < > 153* 155* 129*  < > = values in this interval not displayed.  CBG (last 3)   Recent Labs  11/11/14 0048 11/11/14 0440 11/11/14 0808  GLUCAP 118* 122* 129*    Scheduled Meds: . antiseptic oral rinse  7 mL Mouth Rinse QID  . bacitracin   Topical BID  . chlorhexidine  15 mL Mouth Rinse BID  . ciprofloxacin  400 mg Intravenous Q12H  . guaiFENesin  10 mL Per Tube BID  . heparin  5,000 Units Subcutaneous 3 times per day  . insulin aspart  0-20 Units Subcutaneous 6 times per day  . insulin glargine  15 Units Subcutaneous Daily  . [START ON 11/12/2014] levothyroxine  150 mcg Per Tube QAC breakfast  . lip balm  1 application Topical BID  . metoprolol tartrate  25 mg Per Tube BID  . nystatin   Topical BID  . pantoprazole sodium  40 mg Per Tube Q1200  . sodium chloride  10-40 mL Intracatheter Q12H  . vancomycin  1,250 mg Intravenous Once  . vancomycin  1,250 mg Intravenous Q12H    Continuous Infusions: . sodium chloride 10 mL/hr at 11/11/14 0600  . feeding supplement (VITAL HIGH PROTEIN)    . fentaNYL infusion INTRAVENOUS 200 mcg/hr (11/11/14 0840)    Emmaline KluverHaley Achillies Buehl MS, RD, LDN Pager # [phone]

## 2014-11-11 NOTE — Progress Notes (Addendum)
PULMONARY / CRITICAL CARE MEDICINE   Name: William [name] MRN: [id number] DOB: 1951-04-02    ADMISSION DATE:  10/27/2014  CHIEF COMPLAINT:  Perforated sigmoid colon  BRIEF PATIENT DESCRIPTION:   64 y/o male former smoker presented with abd pain from perforated sigmoid colon taken to OR emergently.  Remained on vent post op.  Failed extubation 1/16, reintubated. Course complicated by vent dependence requiring trach , GNR bacteremia    SIGNIFICANT EVENTS:  1/10  Admit, Partial colectomy  1/13  Off pressors 1/14  On TNA 1/16  Extubated >> reintubated in PM 1/19  Agitation, hypertension overnight, sedation changed to propofol. 60% / 8 1/20 PEEP weaned to 5, FiO2 40%, diuresing 1/22 more hypoxemic, febrile, plan tracheostomy 1/24 rash- benadryl - zosyn stopped  STUDIES: 1/10  CT abd/pelvis >> sigmoid colon diverticulitis with perforation, umbilical hernia, b/l renal stones, 8 mm RLL nodule 1/11  ECHO >> EF 55-60%, nml systolic fxn, PA peak 32   1/17  CT Head >> no acute intracranial abnormality, ? Small vessel ischemic changes, L maxillary sinus disease  SUBJECTIVE:   Remains febrile Weaning well on PS 5/5 Tolerating TFs Agitated int on fent gtt   VITAL SIGNS: Temp:  [100.6 F (38.1 C)-102.6 F (39.2 C)] 100.6 F (38.1 C) (01/25 0800) Pulse Rate:  [97-111] 100 (01/25 0910) Resp:  [0-26] 22 (01/25 0910) BP: (88-138)/(47-87) 128/76 mmHg (01/25 0910) SpO2:  [89 %-99 %] 96 % (01/25 0910) FiO2 (%):  [40 %] 40 % (01/25 0910) Weight:  [122.018 kg (269 lb)] 122.018 kg (269 lb) (01/25 0500)   VENTILATOR SETTINGS: Vent Mode:  [-] PRVC FiO2 (%):  [40 %] 40 % Set Rate:  [18 bmp] 18 bmp Vt Set:  [610 mL] 610 mL PEEP:  [5 cmH20] 5 cmH20 Pressure Support:  [5 cmH20] 5 cmH20 Plateau Pressure:  [9 cmH20-17 cmH20] 17 cmH20   INTAKE / OUTPUT: Intake/Output      01/24 0701 - 01/25 0700 01/25 0701 - 01/26 0700   I.V. (mL/kg) 814.4 (6.7) 40 (0.3)   Other 80    NG/GT 2105 75   IV  Piggyback 450    TPN     Total Intake(mL/kg) 3449.4 (28.3) 115 (0.9)   Urine (mL/kg/hr) 4800 (1.6) 240 (0.7)   Emesis/NG output 0 (0)    Drains 0 (0)    Stool 900 (0.3)    Total Output 5700 240   Net -2250.6 -125          PHYSICAL EXAMINATION:  Gen: sedated on vent HEENT: NCAT EOMi, trach PULM:  rhonchi bases CV: tachy, no mgr AB: infrequent bowel sounds, midline scar, wound vac Derm: macular erythematous rash Ext: SCD Neuro: sedated, grimace with stim, does not follow commands  LABS:  CBC  Recent Labs Lab 11/07/14 0350 11/09/14 0435 11/11/14 0450  WBC 13.5* 13.6* 7.7  HGB 12.6* 11.8* 11.3*  HCT 38.7* 37.5* 35.9*  PLT 436* 459* 467*   BMET  Recent Labs Lab 11/10/14 0900 11/10/14 1736 11/11/14 0450  NA 140 140 143  K 3.8 3.7 3.3*  CL 108 107 105  CO2 BUN 30* 32* 30*  CREATININE 1.11 1.23 1.04  GLUCOSE 153* 155* 129*   Electrolytes  Recent Labs Lab 11/07/14 0350  11/10/14 0900 11/10/14 1736 11/11/14 0450  CALCIUM 8.2*  < > 8.3* 8.2* 8.2*  MG 2.2  --   --   --  2.0  PHOS 3.5  --   --   --  3.6  < > = values in this interval not displayed. Sepsis Markers  Recent Labs Lab 11/05/14 0420  LATICACIDVEN 1.3   ABG No results for input(s): PHART, PCO2ART, PO2ART in the last 168 hours. Liver Enzymes  Recent Labs Lab 11/07/14 0350 11/11/14 0450  AST 21 34  ALT 34 43  ALKPHOS 78 96  BILITOT 0.7 0.4  ALBUMIN 2.9* 2.6*   Glucose  Recent Labs Lab 11/10/14 1211 11/10/14 1622 11/10/14 2041 11/11/14 0048 11/11/14 0440 11/11/14 0808  GLUCAP 116* 147* 139* 118* 122* 129*    Imaging Dg Chest Port 1 View  11/11/2014   CLINICAL DATA:  Acute respiratory failure.  EXAM: PORTABLE CHEST - 1 VIEW  COMPARISON:  11/09/2014  FINDINGS: Again noted is a nasogastric tube and tracheostomy tube. The nasogastric tube tip is beyond the image. Increased basilar densities, particularly on the right side. Cardiac silhouette remains enlarged. Left  subclavian central line tip in the upper SVC region. Again noted are prominent vascular markings and cannot exclude pulmonary edema.  IMPRESSION: Increased basilar chest densities. Findings may represent a combination of pleural fluid and atelectasis.  Bilateral parenchymal lung densities could represent edema and/or airspace disease.   Electronically Signed   By: Richarda Overlie M.D.   On: 11/11/2014 07:36    ASSESSMENT / PLAN:  PULMONARY ETT 1/10 >> 1/16, 1/16 >> 1/22 Trach (Dr Jenne Pane) 1/22 >>  A: Acute respiratory failure with hypoxemia, worse 1/21> suspect HCAP, +/- pulm edema (net even hospitalization) Prolonged Vent course> tracheostomy 1/22 Hx of tobacco abuse with 8 mm RLL nodule on CT abd/pelvis - will need outpt FU Hx of OSA P:   SBTs with goal ATC daytime Continue guaifenesin, pulm toilette  CARDIOVASCULAR L femoral CVC 1/11 >> 1/18 RUE PICC 1/13 >> 1/18 (coiled) LUE PICC 1/18 >>  A: Septic shock 2nd to peritonitis >> resolved Demand ischemia > resolved HTN P:   Monitor hemodynamics Hold outpatient lisinopril Continue metoprolol  bid   RENAL A: Lactic Acidosis  - resolved P:   Monitor renal fx, urine outpt, electrolytes Daily KCL replacement Hold diuresis 1/25 in anticipation of contrast adm  GASTROINTESTINAL A: Perforated Sigmoid s/p Partial Colectomy (1/10) Protein calorie malnutrition P:   Post-op care, nutrition per [facility] Protonix for SUP TNA / Lipids stopped 1/24  HEMATOLOGIC A: Anemia of critical illness P: Intermittent CBC, transfuse if Hgb < 7gm/dL SQ heparin for DVT prevention  INFECTIOUS A: Septic shock 2nd to peritonitis from perforated sigmoid colon, GNR bacteremia Left Maxillary Sinus Disease  HCAP 1/22 P:   7 days zosyn, micafungin > d/c'd 1/18 Vanco 1/22 >> 1/24, 1/25 >> Zosyn 1/22 >> 1/24 (rash) cipro 1/24 >>  Fluconazole  1/23 >> 1/25 (rash) for thrush and skin yeast  Vanc/Zosyn 1/22, Cipro added 1/24 pending GNR speciation  and sensitivities > plan 10 day course for GNR bacteremia +/- HCAP   Blood Cx 1/11 >> neg Blood Cx 1/22 >> GNR >>  Resp Cx 1/22 >>staph   Dc diflucan, dc zosyn, ct cipro & avnc CT abdomen with IV & PO contrast for source ENDOCRINE A: Hyperglycemia Hx of hypothyroidism P:   SSI  Change synthroid per tube  NEUROLOGIC A: Post-op pain control Agitation - CT head 1/17 neg for acute abnormality P: RASS Goal: 0 Supportive care:  Minimize sedation as able, promote sleep wake cycle, mobilize etc  Fentanyl gtt  Family: Wife updated bedside 1/25  Today's Summary : Wean to ATC, GNR bacteremia complicated by rash  -  zosyn/ diflucan likely culprits  Care during the described time interval was provided by me and/or other providers on the critical care team.  I have reviewed this patient's available data, including medical history, events of note, physical examination and test results as part of my evaluation  CC time x 6738m  Everson Mott V. MD  11/11/2014, 9:54 AM

## 2014-11-11 NOTE — Progress Notes (Signed)
ANTIBIOTIC CONSULT NOTE - Follow-up  Pharmacy Consult: vancomycin (Cipro per MD) Indication: GNR bacteremia, staph in trach aspirate  Allergies  Allergen Reactions  . Aloprim [Allopurinol] Other (See Comments)    Unknown reaction  . Simvastatin Other (See Comments)    Unknown reaction    Patient Measurements: Height: 5' 11.5" (181.6 cm) Weight: 269 lb (122.018 kg) IBW/kg (Calculated) : 76.45 Adjusted Body Weight:   Vital Signs: Temp: 100.6 F (38.1 C) (01/25 0800) Temp Source: Oral (01/25 0800) BP: 128/76 mmHg (01/25 0800) Pulse Rate: 97 (01/25 0725) Intake/Output from previous day: 01/24 0701 - 01/25 0700 In: 3449.4 [I.V.:814.4; NG/GT:2105; IV Piggyback:450] Out: 5700 [Urine:4800; Stool:900] Intake/Output from this shift: Total I/O In: 115 [I.V.:40; NG/GT:75] Out: 240 [Urine:240]  Labs:  Recent Labs  11/09/14 0435 11/10/14 0900 11/10/14 1736 11/11/14 0450  WBC 13.6*  --   --  7.7  HGB 11.8*  --   --  11.3*  PLT 459*  --   --  467*  CREATININE 1.18 1.11 1.23 1.04   Estimated Creatinine Clearance: 99.9 mL/min (by C-G formula based on Cr of 1.04).  Recent Labs  11/10/14 0900  VANCOTROUGH 15.1     Microbiology: Recent Results (from the past 720 hour(s))  Anaerobic culture     Status: None   Collection Time: 10/28/14  2:59 AM  Result Value Ref Range Status   Specimen Description ABDOMEN  Final   Special Requests NONE  Final   Gram Stain   Final    FEW WBC PRESENT,BOTH PMN AND MONONUCLEAR NO SQUAMOUS EPITHELIAL CELLS SEEN NO ORGANISMS SEEN Performed at [facility]    Culture   Final    MULTIPLE ORGANISMS PRESENT, NONE PREDOMINANT Performed at [facility]    Report Status 11/04/2014 FINAL  Final  Culture, routine-abscess     Status: None   Collection Time: 10/28/14  2:59 AM  Result Value Ref Range Status   Specimen Description ABDOMEN  Final   Special Requests NONE  Final   Gram Stain   Final    FEW WBC PRESENT,BOTH PMN AND  MONONUCLEAR NO SQUAMOUS EPITHELIAL CELLS SEEN NO ORGANISMS SEEN Performed at [facility]    Culture   Final    NO GROWTH 3 DAYS Performed at [facility]    Report Status 10/31/2014 FINAL  Final  Culture, blood (routine x 2)     Status: None   Collection Time: 10/28/14  5:55 AM  Result Value Ref Range Status   Specimen Description BLOOD RIGHT ANTECUBITAL  Final   Special Requests BOTTLES DRAWN AEROBIC AND ANAEROBIC 10CC  Final   Culture   Final    NO GROWTH 5 DAYS Note: Culture results may be compromised due to an excessive volume of blood received in culture bottles. Performed at [facility]    Report Status 11/03/2014 FINAL  Final  Culture, blood (routine x 2)     Status: None   Collection Time: 10/28/14  6:00 AM  Result Value Ref Range Status   Specimen Description BLOOD RIGHT ARM  Final   Special Requests BOTTLES DRAWN AEROBIC AND ANAEROBIC 5CC  Final   Culture   Final    NO GROWTH 5 DAYS Performed at [facility]    Report Status 11/03/2014 FINAL  Final  MRSA PCR Screening     Status: None   Collection Time: 10/28/14  8:55 AM  Result Value Ref Range Status   MRSA by PCR NEGATIVE NEGATIVE Final  Comment:        The GeneXpert MRSA Assay (FDA approved for NASAL specimens only), is one component of a comprehensive MRSA colonization surveillance program. It is not intended to diagnose MRSA infection nor to guide or monitor treatment for MRSA infections.   Culture, Urine     Status: None   Collection Time: 10/28/14  9:40 AM  Result Value Ref Range Status   Specimen Description URINE, CATHETERIZED  Final   Special Requests NONE  Final   Colony Count NO GROWTH Performed at [facility]   Final   Culture NO GROWTH Performed at [facility]   Final   Report Status 10/29/2014 FINAL  Final  Culture, respiratory (NON-Expectorated)     Status: None (Preliminary result)   Collection Time: 11/08/14  9:45 AM  Result  Value Ref Range Status   Specimen Description TRACHEAL ASPIRATE  Final   Special Requests Normal  Final   Gram Stain   Final    MODERATE WBC PRESENT, PREDOMINANTLY PMN RARE SQUAMOUS EPITHELIAL CELLS PRESENT FEW GRAM POSITIVE COCCI IN PAIRS Performed at [facility]    Culture   Final    MODERATE STAPHYLOCOCCUS AUREUS Note: RIFAMPIN AND GENTAMICIN SHOULD NOT BE USED AS SINGLE DRUGS FOR TREATMENT OF STAPH INFECTIONS. Performed at [facility]    Report Status PENDING  Incomplete  Culture, blood (routine x 2)     Status: None (Preliminary result)   Collection Time: 11/08/14 10:48 AM  Result Value Ref Range Status   Specimen Description BLOOD RIGHT HAND  Final   Special Requests   Final    BOTTLES DRAWN AEROBIC AND ANAEROBIC AER ANA   Culture   Final    GRAM NEGATIVE RODS Note: Gram Stain Report Called to,Read Back By and Verified With: Marita Kansas RN on 11/10/14 at 05:05 by Christie Nottingham Performed at [facility]    Report Status PENDING  Incomplete  Culture, blood (routine x 2)     Status: None (Preliminary result)   Collection Time: 11/08/14 10:55 AM  Result Value Ref Range Status   Specimen Description BLOOD RIGHT HAND  Final   Special Requests BOTTLES DRAWN AEROBIC AND ANAEROBIC  Final   Culture   Final    GRAM NEGATIVE RODS Note: CRITICAL RESULT CALLED TO, READ BACK BY AND VERIFIED WITH: Marita Kansas RN @ 1119PM VINCJ Performed at [facility]    Report Status PENDING  Incomplete    Assessment: 5 yoM former smoker admitted initially with perforated sigmoid colon planned to be managed conservatively but became hypotensive and taken emergently to OR for partial colectomy.  Pt remained on vent post-op with pressors and had empiric antibiotics/antifungal for intra-abd abscess.  Pt now off pressors and abx, however failed extubation 1/16 and tracheostomy performed 1/22.  Pt with new fever this morning and pharmacy re-consulted to resume  vancomycin and zosyn for HCAP.  Zosyn stopped evening of 1/24 d/t urticaria (charted as far back as 1/17, but worsening over weekend 1/23-24).  He had tolerated zosyn previously during admission.   1/11 >> Zosyn >>1/18 1/11 >> Vancomycin >> 1/16 1/11 >> Micafungin >> 1/18 1/22 >> Vancomycin >> 1/24 1/22 >> Zosyn >> 1/24 1/23 >> fluconazole >> 1/24 >> ciprofloxacin >> 1/25 >> vancomycin >>  Tmax24: 102.6  WBCs: WNL Renal: SCr WNL, normalized CrCL = 4ml/min  1/11 blood x 2: NGF 1/11 urine: NGF 1/11 anaerobic abscess: multiple organisms present, none predominant 1/11 aerobic  abscess: NGF 1/11 MRSA PCR: negative 1/22 blood x 2: GNR 2/2 (susc pending) 1/22 trach aspirate: Mod SA (Susc pending)  Dose changes/drug level info:  1/11: Changed Vancomycin to 1g IV q12h per obesity nomogram/Normalized CrCl 1/13 1500 vanco trough = 8.5 mcg/ml on 1gm IV q12h - prior to 5th maintenance dose (drawn 1h past due time of 14:00). Increased to q8 dosing, but hesitant to increase further with obesity and risk of accumulation. Ordered another trough at new steady state on 1/15 1/15: 0500 VT 17.6 mg/dL on 1g q8. Concerned over potential for accumulation - changed to 1250mg  q12h.  Goal of Therapy:  Vancomycin trough level 15-20 mcg/ml  Plan:   Based on trach aspirate cultures with moderate staph aureus, orders to resume vancomycin (last dose 1/23 21:35). Start vancomycin 1250mg  IV q12h  Check vancomycin trough at steady state  Monitor urticaria - zosyn stopped 1/24, ? From vancomycin but has not improved in the 24h off vancomycin (? Not long enough time off).  Rash present prior to ciprofloxacin initiation  Await final culture results  Juliette Alcideustin Sameul Tagle, PharmD, BCPS.   Pager: [phone] 11/11/2014 9:21 AM

## 2014-11-11 NOTE — Consult Note (Addendum)
WOC ostomy follow up Stoma type/location: LLQ Colostomy Stomal assessment/size: 1 and 1/8 inch x 1 3/4 inch oval with mild elevation. Duskiness of stoma is resolving, tissue is less friable and there is no bleeding today.  There is an intact blood blister noted at the 8 o'clock area measuring 0.5cm x 2.5cm that partially ruptures during pouch removal.  I cover today with a thin film transparent dressing. Peristomal assessment: As described above.  Mild depression around stoma warrants placement of a skin barrier ring. Treatment options for stomal/peristomal skin: Skin barrier ring Output Light brown soft to liquid stool from administration of contrast Ostomy pouching: 2pc. 2 and 3/4 inch pouching system with skin barrier ring.  I trim medial aspect of adhesive to free pouching system from wound management system. Next pouch change is due on 1/28 (Thursday). Education provided: None today.  Patient's wife has just gone home for a few hours. [place] in [employer] [place]: No  WOC wound follow up Wound type:Midline surgical wound Measurement:15cm x 3cm x 5cm. Again, the presentation of a friable wound bed with a 3cm linear area in the center that presents with bleeding and (now) 5cm depth. Wound bed:As described above. Drainage (amount, consistency, odor) serosanguinous Periwound:intact, no erythema.  Hair removal via dry powder shave at the most distal edge of the wound to facilitate adhesive adherence. Dressing procedure/placement/frequency:V.A.C (NPWT) therapy at 125mmHg continuous pressure.  Two pieces of black foam used to obliterate dead space (one in deepest area and one on top). Next dressing change is due on Wednesday, 1/27.  WOC nursing team will follow, and will remain available to this patient, the nursing, surgical and medical teams.   Thanks, Ladona MowLaurie Jeanet Lupe, MSN, RN, GNP, [place], CWON-AP [phone])

## 2014-11-11 NOTE — Progress Notes (Signed)
Patient ID: William [name], male   DOB: 10/13/1951, 64 y.o.   MRN: [id number] 3 Days Post-Op  Subjective: Remains febrile.  Zosyn d/c'd because of rash.  GNR growing in bloodstream.     Objective: Vital signs in last 24 hours: Temp:  [100.6 F (38.1 C)-102.6 F (39.2 C)] 101.1 F (38.4 C) (01/25 0420) Pulse Rate:  [97-111] 97 (01/25 0725) Resp:  [0-26] 18 (01/25 0725) BP: (88-138)/(47-87) 134/76 mmHg (01/25 0725) SpO2:  [89 %-99 %] 99 % (01/25 0725) FiO2 (%):  [40 %] 40 % (01/25 0725) Weight:  [269 lb (122.018 kg)] 269 lb (122.018 kg) (01/25 0500) Last BM Date: 11/10/14  . sodium chloride 10 mL/hr at 11/11/14 0600  . fentaNYL infusion INTRAVENOUS 200 mcg/hr (11/11/14 0600)   Intake/Output from previous day: 01/24 0701 - 01/25 0700 In: 3334.4 [I.V.:774.4; NG/GT:2030; IV Piggyback:450] Out: 5700 [Urine:4800; Stool:900] Intake/Output this shift:    General appearance: sleepy, no distress Resp: breathing comfortably GI: Wound vac in place, he has stool coming from the ostomy.  Abd soft, non tender, non distended.     Lab Results:   Recent Labs  11/09/14 0435 11/11/14 0450  WBC 13.6* 7.7  HGB 11.8* 11.3*  HCT 37.5* 35.9*  PLT 459* 467*    BMET  Recent Labs  11/10/14 1736 11/11/14 0450  NA 140 143  K 3.7 3.3*  CL 107 105  CO2 27 27  GLUCOSE 155* 129*  BUN 32* 30*  CREATININE 1.23 1.04  CALCIUM 8.2* 8.2*   PT/INR No results for input(s): LABPROT, INR in the last 72 hours.   Recent Labs Lab 11/07/14 0350 11/11/14 0450  AST 21 34  ALT 34 43  ALKPHOS 78 96  BILITOT 0.7 0.4  PROT 6.3 6.1  ALBUMIN 2.9* 2.6*     Lipase     Component Value Date/Time   LIPASE 23 10/27/2014 1810     Studies/Results: Dg Chest Port 1 View  11/11/2014   CLINICAL DATA:  Acute respiratory failure.  EXAM: PORTABLE CHEST - 1 VIEW  COMPARISON:  11/09/2014  FINDINGS: Again noted is a nasogastric tube and tracheostomy tube. The nasogastric tube tip is beyond the image.  Increased basilar densities, particularly on the right side. Cardiac silhouette remains enlarged. Left subclavian central line tip in the upper SVC region. Again noted are prominent vascular markings and cannot exclude pulmonary edema.  IMPRESSION: Increased basilar chest densities. Findings may represent a combination of pleural fluid and atelectasis.  Bilateral parenchymal lung densities could represent edema and/or airspace disease.   Electronically Signed   By: Richarda OverlieAdam  Henn M.D.   On: 11/11/2014 07:36    Medications: . antiseptic oral rinse  7 mL Mouth Rinse QID  . bacitracin   Topical BID  . chlorhexidine  15 mL Mouth Rinse BID  . ciprofloxacin  400 mg Intravenous Q12H  . feeding supplement (PRO-STAT SUGAR FREE 64)  30 mL Oral BID  . feeding supplement (VITAL HIGH PROTEIN)  1,000 mL Per Tube Q24H  . fluconazole  100 mg Oral Daily  . guaiFENesin  10 mL Per Tube BID  . heparin  5,000 Units Subcutaneous 3 times per day  . insulin aspart  0-20 Units Subcutaneous 6 times per day  . insulin glargine  15 Units Subcutaneous Daily  . levothyroxine  75 mcg Intravenous Daily  . lip balm  1 application Topical BID  . metoprolol tartrate  25 mg Per Tube BID  . nystatin   Topical BID  .  pantoprazole (PROTONIX) IV  40 mg Intravenous Q24H  . sodium chloride  10-40 mL Intracatheter Q12H    Assessment/Plan 1. Sigmoid diverticulitis, Perforated Bowel EXPLORATORY LAPAROTOMY, PARTIAL COLECTOMY Sigmoid, COLOSTOMY,  Repair umbilical hernia, 10/28/2014, William Pretty III, MD.  2. Sepsis with hypotension 3.Ventilator dependant respiratory failure/EXTUBATED/RE intubated 11/02/14  4. Hx of hypertension, now requiring pressors 5. Hypothyroid TSH 16 6. Hx of tobacco use 30 years (quit 9 years ago) 7. Body mass index is 38.51 kg  8. Hx of nephrolithiasis 9. Heparin for DVT prophylaxis 10. Malnutrition- tube feeds at goal.     Plan:  Tolerating tube feeds.  Continue.   Will go ahead and get scan  due to presence of GNR in bloodstream.   Will scan chest as well.     LOS: 15 days    William [name] 11/11/2014

## 2014-11-12 ENCOUNTER — Encounter (HOSPITAL_COMMUNITY): Admission: EM | Disposition: A | Discharge status: Rehabilitation Facility | Payer: Self-pay | Source: Home / Self Care

## 2014-11-12 DIAGNOSIS — R509 Fever, unspecified: Secondary | ICD-10-CM | POA: Diagnosis present

## 2014-11-12 DIAGNOSIS — K651 Peritoneal abscess: Secondary | ICD-10-CM | POA: Diagnosis present

## 2014-11-12 DIAGNOSIS — R21 Rash and other nonspecific skin eruption: Secondary | ICD-10-CM | POA: Diagnosis not present

## 2014-11-12 DIAGNOSIS — D649 Anemia, unspecified: Secondary | ICD-10-CM | POA: Diagnosis not present

## 2014-11-12 DIAGNOSIS — K5780 Diverticulitis of intestine, part unspecified, with perforation and abscess without bleeding: Secondary | ICD-10-CM

## 2014-11-12 DIAGNOSIS — A498 Other bacterial infections of unspecified site: Secondary | ICD-10-CM | POA: Diagnosis not present

## 2014-11-12 DIAGNOSIS — E669 Obesity, unspecified: Secondary | ICD-10-CM | POA: Diagnosis present

## 2014-11-12 LAB — CULTURE, BLOOD (ROUTINE X 2)

## 2014-11-12 LAB — CBC
HEMATOCRIT: 35.8 % — AB (ref 39.0–52.0)
Hemoglobin: 11.3 g/dL — ABNORMAL LOW (ref 13.0–17.0)
MCH: 30.8 pg (ref 26.0–34.0)
MCHC: 31.6 g/dL (ref 30.0–36.0)
MCV: 97.5 fL (ref 78.0–100.0)
Platelets: 431 10*3/uL — ABNORMAL HIGH (ref 150–400)
RBC: 3.67 MIL/uL — ABNORMAL LOW (ref 4.22–5.81)
RDW: 14.6 % (ref 11.5–15.5)
WBC: 8.5 10*3/uL (ref 4.0–10.5)

## 2014-11-12 LAB — URINE CULTURE
COLONY COUNT: NO GROWTH
Culture: NO GROWTH

## 2014-11-12 LAB — BASIC METABOLIC PANEL
Anion gap: 6 (ref 5–15)
BUN: 28 mg/dL — AB (ref 6–23)
CALCIUM: 8.4 mg/dL (ref 8.4–10.5)
CHLORIDE: 105 mmol/L (ref 96–112)
CO2: 28 mmol/L (ref 19–32)
Creatinine, Ser: 0.98 mg/dL (ref 0.50–1.35)
GFR, EST NON AFRICAN AMERICAN: 87 mL/min — AB (ref >90)
GLUCOSE: 111 mg/dL — AB (ref 70–99)
POTASSIUM: 3.9 mmol/L (ref 3.5–5.1)
Sodium: 139 mmol/L (ref 135–145)

## 2014-11-12 LAB — GLUCOSE, CAPILLARY
GLUCOSE-CAPILLARY: 113 mg/dL — AB (ref 70–99)
GLUCOSE-CAPILLARY: 115 mg/dL — AB (ref 70–99)
GLUCOSE-CAPILLARY: 117 mg/dL — AB (ref 70–99)
Glucose-Capillary: 117 mg/dL — ABNORMAL HIGH (ref 70–99)
Glucose-Capillary: 155 mg/dL — ABNORMAL HIGH (ref 70–99)

## 2014-11-12 SURGERY — CREATION, TRACHEOSTOMY
Anesthesia: General

## 2014-11-12 MED ORDER — FENTANYL CITRATE 0.05 MG/ML IJ SOLN
25.0000 ug | INTRAMUSCULAR | Status: DC | PRN
  Administered 2014-11-12 – 2014-11-13 (×11): 100 ug via INTRAVENOUS
  Filled 2014-11-12 (×13): qty 2

## 2014-11-12 MED ORDER — LEVOFLOXACIN IN D5W 750 MG/150ML IV SOLN
750.0000 mg | INTRAVENOUS | Status: DC
  Administered 2014-11-12 – 2014-11-20 (×9): 750 mg via INTRAVENOUS
  Filled 2014-11-12 (×9): qty 150

## 2014-11-12 MED ORDER — METRONIDAZOLE IN NACL 5-0.79 MG/ML-% IV SOLN
500.0000 mg | Freq: Three times a day (TID) | INTRAVENOUS | Status: DC
  Administered 2014-11-12 – 2014-11-21 (×25): 500 mg via INTRAVENOUS
  Filled 2014-11-12 (×27): qty 100

## 2014-11-12 NOTE — Progress Notes (Signed)
226cc Fentanyl 255300mcg/250mL wasted with Shawna ClampSaraBeth Early, RN.

## 2014-11-12 NOTE — Consult Note (Signed)
[facility]    Date of Admission:  10/27/2014   Total days of antibiotics 5 (second round)        Day 5 vancomycin        Day 3 ciprofloxacin               Reason for Consult: ICU fever    Referring Physician: Dr. Cyril Mourning  Principal Problem:   Fever Active Problems:   Diverticulitis of colon with perforation s/p colectomy/colostomy 10/28/2014   Acute respiratory failure with hypoxia   Rash   Abscess of suppurative peritonitis   Bacteroides fragilis infection   Solitary pulmonary nodule on lung CT   Sleep apnea   Normocytic anemia   Obesity   . antiseptic oral rinse  7 mL Mouth Rinse QID  . bacitracin   Topical BID  . chlorhexidine  15 mL Mouth Rinse BID  . ciprofloxacin  400 mg Intravenous Q12H  . guaiFENesin  10 mL Per Tube BID  . heparin  5,000 Units Subcutaneous 3 times per day  . insulin aspart  0-20 Units Subcutaneous 6 times per day  . insulin glargine  15 Units Subcutaneous Daily  . levothyroxine  150 mcg Per Tube QAC breakfast  . lip balm  1 application Topical BID  . metoprolol tartrate  25 mg Per Tube BID  . nystatin   Topical BID  . pantoprazole sodium  40 mg Per Tube Q1200  . sodium chloride  10-40 mL Intracatheter Q12H  . vancomycin  1,250 mg Intravenous Q12H    Recommendations: 1. Change ciprofloxacin to levofloxacin 2. Discontinue vancomycin 3. Start metronidazole   Assessment: There are multiple possible explanations for his recent fevers. Given that he still has some evidence of intra-abdominal infection by CT scan and now has Bacteroides bacteremia I think persistent intra-abdominal infection is definitely present. However he also probably has developed healthcare associated pneumonia and a superimposed drug allergy causing rash that may also be contributing to his fever. He does not appear to have any evidence of UTI and I do not think that he is likely to have a PICC infection. I do not think that he needs  vancomycin and will stop it now in case it is contributing to his fever and rash. I will continue treating his intra-abdominal infection, bacteremia and healthcare associated pneumonia with a combination of levofloxacin and metronidazole. I discussed the situation and my recommendations with his wife who is at the bedside.   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 CT scan revealed diverticulitis with acute colon perforation and a 6.1 x 6.6 cm abscess adjacent to the sigmoid colon. He had low-grade fevers on admission. He underwent emergent abdominal surgery with periumbilical hernia repair, drainage of the abscess and colostomy placement. The operative report reveals that there was an extensive amount of stool contaminating the abdominal cavity. Initial blood cultures were negative but abscess cultures grew multiple organisms that were not identified. He was treated initially with vancomycin, piperacillin tazobactam and micafungin. He defervesced and antibiotics were stopped on 11/03/2014. His fevers returned on 11/07/2014. He was started back on vancomycin and piperacillin tazobactam. Repeat blood cultures on 11/08/2014 grew Bacteroides fragilis in both sets. Repeat CT scan showed 2 smaller abscesses in the location of the previous abscess. Chest CT scan also revealed by basilar opacities in the  dependent portions of the lungs that could reflect atelectasis and/or healthcare associated pneumonia. He has had large amounts of purulent, bloody trach secretions. Sputum cultures have grown MSSA. He developed a diffuse erythematous rash 2 days after restarting antibiotics. Vancomycin was continued and piperacillin tazobactam was changed to ciprofloxacin. His wife and his nurses report that his rash is improving and he is much more alert and interactive today.   Review of Systems: Review of systems not obtained due  to patient factors.  Past Medical History  Diagnosis Date  . Renal disorder     kidney stones  . Hypertension   . Thyroid disease     History  Substance Use Topics  . Smoking status: Former Games developermoker  . Smokeless tobacco: Not on file  . Alcohol Use: Not on file    History reviewed. No pertinent family history. Allergies  Allergen Reactions  . Aloprim [Allopurinol] Other (See Comments)    Unknown reaction  . Simvastatin Other (See Comments)    Unknown reaction    OBJECTIVE: Blood pressure 161/64, pulse 105, temperature 99.6 F (37.6 C), temperature source Oral, resp. rate 19, height 5' 11.5" (1.816 m), weight 266 lb 12.1 oz (121 kg), SpO2 95 %. General: He is currently febrile and sweating. He does respond appropriately to his wife's questions Skin: Generalized, splotchy blanching erythematous, macular rash. Left arm PICC (placed 11/04/2014) site is normal Lungs: Bilateral rhonchi Cor: Distant and hard to hear heart sounds. Regular on monitor. No murmur heard Abdomen: Obese, soft and not obviously tender. He has scattered ecchymoses on his lower abdomen. There is an open midline lower abdominal incision with a VAC wound dressing. There is a left lower quadrant colostomy with normal-appearing brown stool in the bag He moves all extremities spontaneously  Lab Results Lab Results  Component Value Date   WBC 8.5 11/12/2014   HGB 11.3* 11/12/2014   HCT 35.8* 11/12/2014   MCV 97.5 11/12/2014   PLT 431* 11/12/2014    Lab Results  Component Value Date   CREATININE 0.98 11/12/2014   BUN 28* 11/12/2014   NA 139 11/12/2014   K 3.9 11/12/2014   CL 105 11/12/2014   CO2 28 11/12/2014    Lab Results  Component Value Date   ALT 43 11/11/2014   AST 34 11/11/2014   ALKPHOS 96 11/11/2014   BILITOT 0.4 11/11/2014     Microbiology: Recent Results (from the past 240 hour(s))  Culture, respiratory (NON-Expectorated)     Status: None   Collection Time: 11/08/14  9:45 AM  Result  Value Ref Range Status   Specimen Description TRACHEAL ASPIRATE  Final   Special Requests Normal  Final   Gram Stain   Final    MODERATE WBC PRESENT, PREDOMINANTLY PMN RARE SQUAMOUS EPITHELIAL CELLS PRESENT FEW GRAM POSITIVE COCCI IN PAIRS Performed at [facility]    Culture   Final    MODERATE STAPHYLOCOCCUS AUREUS Note: RIFAMPIN AND GENTAMICIN SHOULD NOT BE USED AS SINGLE DRUGS FOR TREATMENT OF STAPH INFECTIONS. Performed at [facility]    Report Status 11/11/2014 FINAL  Final   Organism ID, Bacteria STAPHYLOCOCCUS AUREUS  Final      Susceptibility   Staphylococcus aureus - MIC*    CLINDAMYCIN <=0.25 SENSITIVE Sensitive     ERYTHROMYCIN 0.5 SENSITIVE Sensitive     GENTAMICIN <=0.5 SENSITIVE Sensitive     LEVOFLOXACIN 0.25 SENSITIVE Sensitive     OXACILLIN <=0.25 SENSITIVE Sensitive     PENICILLIN 0.12 SENSITIVE Sensitive  RIFAMPIN <=0.5 SENSITIVE Sensitive     TRIMETH/SULFA <=10 SENSITIVE Sensitive     VANCOMYCIN 1 SENSITIVE Sensitive     TETRACYCLINE <=1 SENSITIVE Sensitive     MOXIFLOXACIN <=0.25 SENSITIVE Sensitive     * MODERATE STAPHYLOCOCCUS AUREUS  Culture, blood (routine x 2)     Status: None   Collection Time: 11/08/14 10:48 AM  Result Value Ref Range Status   Specimen Description BLOOD RIGHT HAND  Final   Special Requests   Final    BOTTLES DRAWN AEROBIC AND ANAEROBIC AER ANA   Culture   Final    BACTEROIDES FRAGILIS Note: BETA LACTAMASE POSITIVE Note: Gram Stain Report Called to,Read Back By and Verified With: Marita Kansas RN on 11/10/14 at 05:05 by Christie Nottingham Performed at [facility]    Report Status 11/12/2014 FINAL  Final  Culture, blood (routine x 2)     Status: None   Collection Time: 11/08/14 10:55 AM  Result Value Ref Range Status   Specimen Description BLOOD RIGHT HAND  Final   Special Requests BOTTLES DRAWN AEROBIC AND ANAEROBIC  Final   Culture   Final    BACTEROIDES FRAGILIS Note: BETA LACTAMASE  POSITIVE Note: Gram Stain Report Called to,Read Back By and Verified With: Marita Kansas RN @ [phone] [facility] 11/09/14 Performed at [facility]    Report Status 11/12/2014 FINAL  Final  Culture, Urine     Status: None   Collection Time: 11/11/14 10:55 AM  Result Value Ref Range Status   Specimen Description URINE, CATHETERIZED  Final   Special Requests NONE  Final   Colony Count NO GROWTH Performed at [facility]   Final   Culture NO GROWTH Performed at [facility]   Final   Report Status 11/12/2014 FINAL  Final    Cliffton Asters, MD [facility] [phone] pager   [phone] cell 11/12/2014, 3:39 PM

## 2014-11-12 NOTE — Progress Notes (Signed)
Patient ID: William [name], male   DOB: 04-Dec-1950, 64 y.o.   MRN: [id number] 4 Days Post-Op  Subjective: Remains febrile. GNR growing in bloodstream. Sleepy, but more awake than yesterday.   Objective: Vital signs in last 24 hours: Temp:  [100.6 F (38.1 C)-101.5 F (38.6 C)] 100.9 F (38.3 C) (01/26 0800) Pulse Rate:  [95-102] 101 (01/26 0914) Resp:  [12-26] 14 (01/26 0914) BP: (98-154)/(46-98) 138/75 mmHg (01/26 0900) SpO2:  [91 %-100 %] 97 % (01/26 0914) FiO2 (%):  [40 %-50 %] 40 % (01/26 0914) Weight:  [266 lb 12.1 oz (121 kg)] 266 lb 12.1 oz (121 kg) (01/26 0600) Last BM Date: 11/11/14 (colostomy)  . sodium chloride 10 mL/hr at 11/11/14 0600  . feeding supplement (VITAL HIGH PROTEIN) 1,000 mL (11/12/14 0626)   Intake/Output from previous day: 01/25 0701 - 01/26 0700 In: 4318.3 [I.V.:853.3; ZH/YQ:6578; IV Piggyback:1150] Out: 3063 [Urine:2630; Stool:433] Intake/Output this shift: Total I/O In: 200 [I.V.:50; NG/GT:150] Out: 225 [Urine:225]  General appearance: sleepy, no distress Resp: breathing comfortably GI: Wound vac in place, he has stool coming from the ostomy.  Abd soft, non tender, non distended.     Lab Results:   Recent Labs  11/11/14 0450 11/12/14 0550  WBC 7.7 8.5  HGB 11.3* 11.3*  HCT 35.9* 35.8*  PLT 467* 431*    BMET  Recent Labs  11/11/14 0450 11/12/14 0550  NA 143 139  K 3.3* 3.9  CL 105 105  CO2 27 28  GLUCOSE 129* 111*  BUN 30* 28*  CREATININE 1.04 0.98  CALCIUM 8.2* 8.4   PT/INR No results for input(s): LABPROT, INR in the last 72 hours.   Recent Labs Lab 11/07/14 0350 11/11/14 0450  AST 21 34  ALT 34 43  ALKPHOS 78 96  BILITOT 0.7 0.4  PROT 6.3 6.1  ALBUMIN 2.9* 2.6*     Lipase     Component Value Date/Time   LIPASE 23 10/27/2014 1810     Studies/Results: Ct Chest W Contrast  11/11/2014   CLINICAL DATA:  Febrile, abscess.  EXAM: CT CHEST, ABDOMEN, AND PELVIS WITH CONTRAST  TECHNIQUE: Multidetector CT  imaging of the chest, abdomen and pelvis was performed following the standard protocol during bolus administration of intravenous contrast.  CONTRAST:  OMNIPAQUE IOHEXOL 300 MG/ML  SOLN  COMPARISON:  10/27/2014.  FINDINGS: CT CHEST FINDINGS  CT CHEST FINDINGS  Mediastinum/Nodes: Tracheostomy is midline. Mediastinal and hilar lymph nodes are not enlarged by CT size criteria. No axillary adenopathy. Left PICC terminates in the low SVC. Heart is mildly enlarged. No pericardial effusion. Coronary artery calcification.  Lungs/Pleura: Small bilateral pleural effusions with collapse/ consolidation in both lower lobes. Mild volume loss is seen dependently in both upper lobes. Image quality is degraded by expiratory phase imaging and respiratory motion. Probable 7 mm subpleural lymph node along the minor fissure, stable. Airway is otherwise unremarkable.  Musculoskeletal: No worrisome lytic or sclerotic lesions. Degenerative changes are seen in the spine.  CT ABDOMEN AND PELVIS FINDINGS  Hepatobiliary: Liver and gallbladder are unremarkable. No biliary duct dilatation.  Pancreas: Negative.  Spleen: Negative.  Adrenals/Urinary Tract: Adrenal glands are unremarkable. Stones are seen in the kidneys bilaterally. Ureters are decompressed. Bladder is low in volume with a Foley catheter in place.  Stomach/Bowel: Nasogastric tube terminates in the stomach. Stomach, small bowel and appendix are otherwise unremarkable. Left lower quadrant colostomy. Residual rectosigmoid colon is unremarkable.  Vascular/Lymphatic: Atherosclerotic calcification of the arterial vasculature without abdominal aortic aneurysm. A  1.5 cm short axis left external iliac lymph node is unchanged and may be reactive. Otherwise, no pathologically enlarged lymph nodes.  Reproductive: Prostate is normal in size.  Other: 2 small collections of fluid are seen in the left paracolic gutter and appear to have thin hyper attenuating rims (1.4 x 2.5 cm, series 9,  image 84 and 2.4 x 2.6 cm, series 9, image 93). A phlegmonous collection of inflammatory stranding and residual air is seen in the anterior left anatomic pelvis, at the site of previously seen diverticular abscess. Inflammatory changes extend into the left inguinal canal. No organized fluid collection in association. Postoperative changes of a periumbilical hernia repair are seen in the midline ventral abdominal wall, with residual locules of subcutaneous air. Minimal presacral edema. No dependent free fluid. Mesenteries and peritoneum are otherwise unremarkable.  Musculoskeletal: No worrisome lytic or sclerotic lesions. Degenerative changes are seen in the spine.  IMPRESSION: 1. Pulmonary parenchymal collapse/consolidation in both lower lobes, worrisome for pneumonia. Aspiration is not excluded. 2. Possible small abscesses in the left paracolic gutter with residual phlegmon in the anterior left anatomic pelvis, at the site of previously seen diverticular abscess. 3. Postoperative changes of partial colectomy, left lower quadrant colostomy and periumbilical hernia repair. 4. Small bilateral pleural effusions. 5. Bilateral renal stones. 6. Small bilateral inguinal hernias.   Electronically Signed   By: Leanna BattlesMelinda  Blietz M.D.   On: 11/11/2014 15:06   Ct Abdomen Pelvis W Contrast  11/11/2014   CLINICAL DATA:  Febrile, abscess.  EXAM: CT CHEST, ABDOMEN, AND PELVIS WITH CONTRAST  TECHNIQUE: Multidetector CT imaging of the chest, abdomen and pelvis was performed following the standard protocol during bolus administration of intravenous contrast.  CONTRAST:  100mL OMNIPAQUE IOHEXOL 300 MG/ML  SOLN  COMPARISON:  10/27/2014.  FINDINGS: CT CHEST FINDINGS  CT CHEST FINDINGS  Mediastinum/Nodes: Tracheostomy is midline. Mediastinal and hilar lymph nodes are not enlarged by CT size criteria. No axillary adenopathy. Left PICC terminates in the low SVC. Heart is mildly enlarged. No pericardial effusion. Coronary artery  calcification.  Lungs/Pleura: Small bilateral pleural effusions with collapse/ consolidation in both lower lobes. Mild volume loss is seen dependently in both upper lobes. Image quality is degraded by expiratory phase imaging and respiratory motion. Probable 7 mm subpleural lymph node along the minor fissure, stable. Airway is otherwise unremarkable.  Musculoskeletal: No worrisome lytic or sclerotic lesions. Degenerative changes are seen in the spine.  CT ABDOMEN AND PELVIS FINDINGS  Hepatobiliary: Liver and gallbladder are unremarkable. No biliary duct dilatation.  Pancreas: Negative.  Spleen: Negative.  Adrenals/Urinary Tract: Adrenal glands are unremarkable. Stones are seen in the kidneys bilaterally. Ureters are decompressed. Bladder is low in volume with a Foley catheter in place.  Stomach/Bowel: Nasogastric tube terminates in the stomach. Stomach, small bowel and appendix are otherwise unremarkable. Left lower quadrant colostomy. Residual rectosigmoid colon is unremarkable.  Vascular/Lymphatic: Atherosclerotic calcification of the arterial vasculature without abdominal aortic aneurysm. A 1.5 cm short axis left external iliac lymph node is unchanged and may be reactive. Otherwise, no pathologically enlarged lymph nodes.  Reproductive: Prostate is normal in size.  Other: 2 small collections of fluid are seen in the left paracolic gutter and appear to have thin hyper attenuating rims (1.4 x 2.5 cm, series 9, image 84 and 2.4 x 2.6 cm, series 9, image 93). A phlegmonous collection of inflammatory stranding and residual air is seen in the anterior left anatomic pelvis, at the site of previously seen diverticular abscess. Inflammatory changes extend  into the left inguinal canal. No organized fluid collection in association. Postoperative changes of a periumbilical hernia repair are seen in the midline ventral abdominal wall, with residual locules of subcutaneous air. Minimal presacral edema. No dependent free fluid.  Mesenteries and peritoneum are otherwise unremarkable.  Musculoskeletal: No worrisome lytic or sclerotic lesions. Degenerative changes are seen in the spine.  IMPRESSION: 1. Pulmonary parenchymal collapse/consolidation in both lower lobes, worrisome for pneumonia. Aspiration is not excluded. 2. Possible small abscesses in the left paracolic gutter with residual phlegmon in the anterior left anatomic pelvis, at the site of previously seen diverticular abscess. 3. Postoperative changes of partial colectomy, left lower quadrant colostomy and periumbilical hernia repair. 4. Small bilateral pleural effusions. 5. Bilateral renal stones. 6. Small bilateral inguinal hernias.   Electronically Signed   By: Leanna Battles M.D.   On: 11/11/2014 15:06   Dg Chest Port 1 View  11/11/2014   CLINICAL DATA:  Acute respiratory failure.  EXAM: PORTABLE CHEST - 1 VIEW  COMPARISON:  11/09/2014  FINDINGS: Again noted is a nasogastric tube and tracheostomy tube. The nasogastric tube tip is beyond the image. Increased basilar densities, particularly on the right side. Cardiac silhouette remains enlarged. Left subclavian central line tip in the upper SVC region. Again noted are prominent vascular markings and cannot exclude pulmonary edema.  IMPRESSION: Increased basilar chest densities. Findings may represent a combination of pleural fluid and atelectasis.  Bilateral parenchymal lung densities could represent edema and/or airspace disease.   Electronically Signed   By: Richarda Overlie M.D.   On: 11/11/2014 07:36    Medications: . antiseptic oral rinse  7 mL Mouth Rinse QID  . bacitracin   Topical BID  . chlorhexidine  15 mL Mouth Rinse BID  . ciprofloxacin  400 mg Intravenous Q12H  . guaiFENesin  10 mL Per Tube BID  . heparin  5,000 Units Subcutaneous 3 times per day  . insulin aspart  0-20 Units Subcutaneous 6 times per day  . insulin glargine  15 Units Subcutaneous Daily  . levothyroxine  150 mcg Per Tube QAC breakfast  . lip  balm  1 application Topical BID  . metoprolol tartrate  25 mg Per Tube BID  . nystatin   Topical BID  . pantoprazole sodium  40 mg Per Tube Q1200  . sodium chloride  10-40 mL Intracatheter Q12H  . vancomycin  1,250 mg Intravenous Q12H    Assessment/Plan 1. Sigmoid diverticulitis, Perforated Bowel EXPLORATORY LAPAROTOMY, PARTIAL COLECTOMY Sigmoid, COLOSTOMY,  Repair umbilical hernia, 10/28/2014, [name], MD.  2. Sepsis with hypotension 3.Ventilator dependant respiratory failure/EXTUBATED/RE intubated 11/02/14  4. Hx of hypertension, now requiring pressors 5. Hypothyroid TSH 16 6. Hx of tobacco use 30 years (quit 9 years ago) 7. Body mass index is 38.51 kg  8. Hx of nephrolithiasis 9. Heparin for DVT prophylaxis 10. Malnutrition- tube feeds at goal.     Plan:  Tolerating tube feeds.  Continue.   GNR in bloodstream, still awaiting culture species results.   Work towards weaning off ventilator.   Wife at bedside.   LOS: 16 days    Acquanetta Chain 11/12/2014

## 2014-11-12 NOTE — Progress Notes (Signed)
Patient placed on trach collar 40% O2. After 45 minutes patient became very restless and agitated with decreasing O2 sats. Placed back on PSV/CPAP ventilation.

## 2014-11-12 NOTE — Progress Notes (Addendum)
No education needed at this time.  All trach supplies and additional trach at beside.

## 2014-11-12 NOTE — Progress Notes (Deleted)
Placed patient on 40% trach collar. After 45 min. Patient became very restless with increased secretion and decreasing O2 sats. Returned to PSV/CPAP ventilation

## 2014-11-12 NOTE — Progress Notes (Signed)
Called to patient's bedside by RN for patient's    o2 sat 84%. Lavaged and suctioned time 3 for a moderate amount of thick tan, bloody secretions.

## 2014-11-12 NOTE — Progress Notes (Signed)
PULMONARY / CRITICAL CARE MEDICINE   Name: William [name] MRN: [id number] DOB: 02/28/1951    ADMISSION DATE:  10/27/2014  CHIEF COMPLAINT:  Perforated sigmoid colon  BRIEF PATIENT DESCRIPTION:   64 y/o male former smoker presented with abd pain from perforated sigmoid colon taken to OR emergently.  Remained on vent post op.  Failed extubation 1/16, reintubated. Course complicated by vent dependence requiring trach , GNR bacteremia    SIGNIFICANT EVENTS:  1/10  Admit, Partial colectomy  1/13  Off pressors 1/14  On TNA 1/16  Extubated >> reintubated in PM 1/19  Agitation, hypertension overnight, sedation changed to propofol. 60% / 8 1/20 PEEP weaned to 5, FiO2 40%, diuresing 1/22 more hypoxemic, febrile, plan tracheostomy 1/24 rash- benadryl - zosyn stopped  STUDIES: 1/10  CT abd/pelvis >> sigmoid colon diverticulitis with perforation, umbilical hernia, b/l renal stones, 8 mm RLL nodule 1/11  ECHO >> EF 55-60%, nml systolic fxn, PA peak 32   1/17  CT Head >> no acute intracranial abnormality, ? Small vessel ischemic changes, L maxillary sinus disease 1/25 CT abd >> collapse/consolidation in both lower lobes, Possible small abscesses in the left paracolic gutter with residual phlegmon in the anterior left anatomic pelvis, at the site of previously seen diverticular abscess.  SUBJECTIVE:   Remains febrile Weaned well on PS 5/5 Tolerating TFs RASS 0- on lower fent gtt   VITAL SIGNS: Temp:  [100.6 F (38.1 C)-101.5 F (38.6 C)] 100.9 F (38.3 C) (01/26 0800) Pulse Rate:  [95-102] 95 (01/26 0600) Resp:  [15-26] 18 (01/26 0830) BP: (98-154)/(46-98) 131/68 mmHg (01/26 0830) SpO2:  [91 %-100 %] 98 % (01/26 0830) FiO2 (%):  [40 %-50 %] 40 % (01/26 0830) Weight:  [121 kg (266 lb 12.1 oz)] 121 kg (266 lb 12.1 oz) (01/26 0600)   VENTILATOR SETTINGS: Vent Mode:  [-] PRVC FiO2 (%):  [40 %-50 %] 40 % Set Rate:  [18 bmp] 18 bmp Vt Set:  [610 mL] 610 mL PEEP:  [5 cmH20] 5  cmH20 Plateau Pressure:  [10 cmH20-17 cmH20] 13 cmH20   INTAKE / OUTPUT: Intake/Output      01/25 0701 - 01/26 0700 01/26 0701 - 01/27 0700   I.V. (mL/kg) 843.3 (7)    Other 640    NG/GT 1675    IV Piggyback 1150    Total Intake(mL/kg) 4308.3 (35.6)    Urine (mL/kg/hr) 2630 (0.9)    Emesis/NG output     Drains     Stool 433 (0.1)    Total Output 3063     Net +1245.3            PHYSICAL EXAMINATION:  Gen: sedated on vent HEENT: NCAT EOMi, trach PULM:  rhonchi bases CV: tachy, no mgr AB: infrequent bowel sounds, midline scar, wound vac Derm: macular erythematous rash over trunk & BL thighs -unchanged Ext: SCD Neuro: sedated, grimace with stim, does not follow commands  LABS:  CBC  Recent Labs Lab 11/09/14 0435 11/11/14 0450 11/12/14 0550  WBC 13.6* 7.7 8.5  HGB 11.8* 11.3* 11.3*  HCT 37.5* 35.9* 35.8*  PLT 459* 467* 431*   BMET  Recent Labs Lab 11/10/14 1736 11/11/14 0450 11/12/14 0550  NA 140 143 139  K 3.7 3.3* 3.9  CL 107 105 105  CO2 BUN 32* 30* 28*  CREATININE 1.23 1.04 0.98  GLUCOSE 155* 129* 111*   Electrolytes  Recent Labs Lab 11/07/14 0350  11/10/14 1736 11/11/14 0450 11/12/14 0550  CALCIUM 8.2*  < > 8.2* 8.2* 8.4  MG 2.2  --   --  2.0  --   PHOS 3.5  --   --  3.6  --   < > = values in this interval not displayed. Sepsis Markers No results for input(s): LATICACIDVEN, PROCALCITON, O2SATVEN in the last 168 hours. ABG No results for input(s): PHART, PCO2ART, PO2ART in the last 168 hours. Liver Enzymes  Recent Labs Lab 11/07/14 0350 11/11/14 0450  AST 21 34  ALT 34 43  ALKPHOS 78 96  BILITOT 0.7 0.4  ALBUMIN 2.9* 2.6*   Glucose  Recent Labs Lab 11/11/14 1218 11/11/14 1658 11/11/14 1911 11/11/14 2352 11/12/14 0335 11/12/14 0809  GLUCAP 97 99 134* 113* 115* 117*    Imaging Ct Chest W Contrast  11/11/2014   CLINICAL DATA:  Febrile, abscess.  EXAM: CT CHEST, ABDOMEN, AND PELVIS WITH CONTRAST  TECHNIQUE:  Multidetector CT imaging of the chest, abdomen and pelvis was performed following the standard protocol during bolus administration of intravenous contrast.  CONTRAST:  OMNIPAQUE IOHEXOL 300 MG/ML  SOLN  COMPARISON:  10/27/2014.  FINDINGS: CT CHEST FINDINGS  CT CHEST FINDINGS  Mediastinum/Nodes: Tracheostomy is midline. Mediastinal and hilar lymph nodes are not enlarged by CT size criteria. No axillary adenopathy. Left PICC terminates in the low SVC. Heart is mildly enlarged. No pericardial effusion. Coronary artery calcification.  Lungs/Pleura: Small bilateral pleural effusions with collapse/ consolidation in both lower lobes. Mild volume loss is seen dependently in both upper lobes. Image quality is degraded by expiratory phase imaging and respiratory motion. Probable 7 mm subpleural lymph node along the minor fissure, stable. Airway is otherwise unremarkable.  Musculoskeletal: No worrisome lytic or sclerotic lesions. Degenerative changes are seen in the spine.  CT ABDOMEN AND PELVIS FINDINGS  Hepatobiliary: Liver and gallbladder are unremarkable. No biliary duct dilatation.  Pancreas: Negative.  Spleen: Negative.  Adrenals/Urinary Tract: Adrenal glands are unremarkable. Stones are seen in the kidneys bilaterally. Ureters are decompressed. Bladder is low in volume with a Foley catheter in place.  Stomach/Bowel: Nasogastric tube terminates in the stomach. Stomach, small bowel and appendix are otherwise unremarkable. Left lower quadrant colostomy. Residual rectosigmoid colon is unremarkable.  Vascular/Lymphatic: Atherosclerotic calcification of the arterial vasculature without abdominal aortic aneurysm. A 1.5 cm short axis left external iliac lymph node is unchanged and may be reactive. Otherwise, no pathologically enlarged lymph nodes.  Reproductive: Prostate is normal in size.  Other: 2 small collections of fluid are seen in the left paracolic gutter and appear to have thin hyper attenuating rims (1.4 x 2.5  cm, series 9, image 84 and 2.4 x 2.6 cm, series 9, image 93). A phlegmonous collection of inflammatory stranding and residual air is seen in the anterior left anatomic pelvis, at the site of previously seen diverticular abscess. Inflammatory changes extend into the left inguinal canal. No organized fluid collection in association. Postoperative changes of a periumbilical hernia repair are seen in the midline ventral abdominal wall, with residual locules of subcutaneous air. Minimal presacral edema. No dependent free fluid. Mesenteries and peritoneum are otherwise unremarkable.  Musculoskeletal: No worrisome lytic or sclerotic lesions. Degenerative changes are seen in the spine.  IMPRESSION: 1. Pulmonary parenchymal collapse/consolidation in both lower lobes, worrisome for pneumonia. Aspiration is not excluded. 2. Possible small abscesses in the left paracolic gutter with residual phlegmon in the anterior left anatomic pelvis, at the site of previously seen diverticular abscess. 3. Postoperative changes of partial colectomy, left lower quadrant colostomy  and periumbilical hernia repair. 4. Small bilateral pleural effusions. 5. Bilateral renal stones. 6. Small bilateral inguinal hernias.   Electronically Signed   By: Leanna BattlesMelinda  Blietz M.D.   On: 11/11/2014 15:06   Ct Abdomen Pelvis W Contrast  11/11/2014   CLINICAL DATA:  Febrile, abscess.  EXAM: CT CHEST, ABDOMEN, AND PELVIS WITH CONTRAST  TECHNIQUE: Multidetector CT imaging of the chest, abdomen and pelvis was performed following the standard protocol during bolus administration of intravenous contrast.  CONTRAST:  100mL OMNIPAQUE IOHEXOL 300 MG/ML  SOLN  COMPARISON:  10/27/2014.  FINDINGS: CT CHEST FINDINGS  CT CHEST FINDINGS  Mediastinum/Nodes: Tracheostomy is midline. Mediastinal and hilar lymph nodes are not enlarged by CT size criteria. No axillary adenopathy. Left PICC terminates in the low SVC. Heart is mildly enlarged. No pericardial effusion. Coronary  artery calcification.  Lungs/Pleura: Small bilateral pleural effusions with collapse/ consolidation in both lower lobes. Mild volume loss is seen dependently in both upper lobes. Image quality is degraded by expiratory phase imaging and respiratory motion. Probable 7 mm subpleural lymph node along the minor fissure, stable. Airway is otherwise unremarkable.  Musculoskeletal: No worrisome lytic or sclerotic lesions. Degenerative changes are seen in the spine.  CT ABDOMEN AND PELVIS FINDINGS  Hepatobiliary: Liver and gallbladder are unremarkable. No biliary duct dilatation.  Pancreas: Negative.  Spleen: Negative.  Adrenals/Urinary Tract: Adrenal glands are unremarkable. Stones are seen in the kidneys bilaterally. Ureters are decompressed. Bladder is low in volume with a Foley catheter in place.  Stomach/Bowel: Nasogastric tube terminates in the stomach. Stomach, small bowel and appendix are otherwise unremarkable. Left lower quadrant colostomy. Residual rectosigmoid colon is unremarkable.  Vascular/Lymphatic: Atherosclerotic calcification of the arterial vasculature without abdominal aortic aneurysm. A 1.5 cm short axis left external iliac lymph node is unchanged and may be reactive. Otherwise, no pathologically enlarged lymph nodes.  Reproductive: Prostate is normal in size.  Other: 2 small collections of fluid are seen in the left paracolic gutter and appear to have thin hyper attenuating rims (1.4 x 2.5 cm, series 9, image 84 and 2.4 x 2.6 cm, series 9, image 93). A phlegmonous collection of inflammatory stranding and residual air is seen in the anterior left anatomic pelvis, at the site of previously seen diverticular abscess. Inflammatory changes extend into the left inguinal canal. No organized fluid collection in association. Postoperative changes of a periumbilical hernia repair are seen in the midline ventral abdominal wall, with residual locules of subcutaneous air. Minimal presacral edema. No dependent free  fluid. Mesenteries and peritoneum are otherwise unremarkable.  Musculoskeletal: No worrisome lytic or sclerotic lesions. Degenerative changes are seen in the spine.  IMPRESSION: 1. Pulmonary parenchymal collapse/consolidation in both lower lobes, worrisome for pneumonia. Aspiration is not excluded. 2. Possible small abscesses in the left paracolic gutter with residual phlegmon in the anterior left anatomic pelvis, at the site of previously seen diverticular abscess. 3. Postoperative changes of partial colectomy, left lower quadrant colostomy and periumbilical hernia repair. 4. Small bilateral pleural effusions. 5. Bilateral renal stones. 6. Small bilateral inguinal hernias.   Electronically Signed   By: Leanna BattlesMelinda  Blietz M.D.   On: 11/11/2014 15:06   Dg Chest Port 1 View  11/11/2014   CLINICAL DATA:  Acute respiratory failure.  EXAM: PORTABLE CHEST - 1 VIEW  COMPARISON:  11/09/2014  FINDINGS: Again noted is a nasogastric tube and tracheostomy tube. The nasogastric tube tip is beyond the image. Increased basilar densities, particularly on the right side. Cardiac silhouette remains enlarged. Left subclavian  central line tip in the upper SVC region. Again noted are prominent vascular markings and cannot exclude pulmonary edema.  IMPRESSION: Increased basilar chest densities. Findings may represent a combination of pleural fluid and atelectasis.  Bilateral parenchymal lung densities could represent edema and/or airspace disease.   Electronically Signed   By: Richarda Overlie M.D.   On: 11/11/2014 07:36    ASSESSMENT / PLAN:  PULMONARY ETT 1/10 >> 1/16, 1/16 >> 1/22 Trach (Dr Jenne Pane) 1/22 >>  A: Acute respiratory failure with hypoxemia, worse 1/21> suspect HCAP, +/- pulm edema (net even hospitalization) Prolonged Vent course> tracheostomy 1/22 Hx of tobacco abuse with 8 mm RLL nodule on CT abd/pelvis - will need outpt FU Hx of OSA P:   SBTs with goal ATC daytime Continue guaifenesin, pulm  toilette  CARDIOVASCULAR L femoral CVC 1/11 >> 1/18 RUE PICC 1/13 >> 1/18 (coiled) LUE PICC 1/18 >>  A: Septic shock 2nd to peritonitis >> resolved Demand ischemia > resolved HTN P:   Monitor hemodynamics Hold outpatient lisinopril Continue metoprolol 25mg  bid as BP permits   RENAL A: Lactic Acidosis  - resolved P:   Monitor renal fx, urine outpt, electrolytes Hold diuresis 1/25since contrast adm  GASTROINTESTINAL A: Perforated Sigmoid s/p Partial Colectomy (1/10) Protein calorie malnutrition P:   Post-op care, nutrition per [facility] Protonix for SUP TNA / Lipids stopped 1/24  HEMATOLOGIC A: Anemia of critical illness P: Intermittent CBC, transfuse if Hgb < 7gm/dL SQ heparin for DVT prevention  INFECTIOUS A: Septic shock 2nd to peritonitis from perforated sigmoid colon, GNR bacteremia Left Maxillary Sinus Disease  HCAP 1/22 P:   7 days zosyn, micafungin > d/c'd 1/18 Vanco 1/22 >> 1/24, 1/25 >> Zosyn 1/22 >> 1/24 (rash) cipro 1/24 >>  Fluconazole  1/23 >> 1/25 (rash) for thrush and skin yeast  Vanc/Zosyn 1/22, Cipro added 1/24 pending GNR speciation and sensitivities > plan 10 day course for GNR bacteremia +/- HCAP   Blood Cx 1/11 >> neg Blood Cx 1/22 >> GNR >>  Resp Cx 1/22 >>MSSA   Dc diflucan, dc zosyn, ct cipro & vanc -simplify once GNR speciated  ENDOCRINE A: Hyperglycemia Hx of hypothyroidism P:   SSI  Change synthroid per tube  NEUROLOGIC A: Post-op pain control Agitation - CT head 1/17 neg for acute abnormality P: RASS Goal: 0 Supportive care:  Minimize sedation as able, promote sleep wake cycle, mobilize etc  Fentanyl int  Family: Wife updated bedside 1/26  Today's Summary : Wean to ATC, GNR bacteremia complicated by rash  - zosyn/ diflucan likely culprits -although did tolerate zosyn earlier  Care during the described time interval was provided by me and/or other providers on the critical care team.  I have reviewed this patient's  available data, including medical history, events of note, physical examination and test results as part of my evaluation  CC time x 32m  Joshuwa Vecchio V. MD  11/12/2014, 8:55 AM

## 2014-11-13 DIAGNOSIS — K651 Peritoneal abscess: Secondary | ICD-10-CM

## 2014-11-13 DIAGNOSIS — B9561 Methicillin susceptible Staphylococcus aureus infection as the cause of diseases classified elsewhere: Secondary | ICD-10-CM

## 2014-11-13 DIAGNOSIS — Z933 Colostomy status: Secondary | ICD-10-CM

## 2014-11-13 DIAGNOSIS — R5081 Fever presenting with conditions classified elsewhere: Secondary | ICD-10-CM

## 2014-11-13 LAB — CBC
HCT: 35.3 % — ABNORMAL LOW (ref 39.0–52.0)
HEMOGLOBIN: 11.8 g/dL — AB (ref 13.0–17.0)
MCH: 32.2 pg (ref 26.0–34.0)
MCHC: 33.4 g/dL (ref 30.0–36.0)
MCV: 96.2 fL (ref 78.0–100.0)
Platelets: 375 10*3/uL (ref 150–400)
RBC: 3.67 MIL/uL — AB (ref 4.22–5.81)
RDW: 14.1 % (ref 11.5–15.5)
WBC: 9.4 10*3/uL (ref 4.0–10.5)

## 2014-11-13 LAB — BASIC METABOLIC PANEL
ANION GAP: 7 (ref 5–15)
BUN: 24 mg/dL — AB (ref 6–23)
CHLORIDE: 107 mmol/L (ref 96–112)
CO2: 27 mmol/L (ref 19–32)
Calcium: 8.6 mg/dL (ref 8.4–10.5)
Creatinine, Ser: 0.81 mg/dL (ref 0.50–1.35)
GFR calc non Af Amer: >90 mL/min (ref >90)
Glucose, Bld: 151 mg/dL — ABNORMAL HIGH (ref 70–99)
Potassium: 3.6 mmol/L (ref 3.5–5.1)
Sodium: 141 mmol/L (ref 135–145)

## 2014-11-13 LAB — GLUCOSE, CAPILLARY
GLUCOSE-CAPILLARY: 119 mg/dL — AB (ref 70–99)
GLUCOSE-CAPILLARY: 130 mg/dL — AB (ref 70–99)
GLUCOSE-CAPILLARY: 135 mg/dL — AB (ref 70–99)
GLUCOSE-CAPILLARY: 141 mg/dL — AB (ref 70–99)
GLUCOSE-CAPILLARY: 149 mg/dL — AB (ref 70–99)
Glucose-Capillary: 118 mg/dL — ABNORMAL HIGH (ref 70–99)
Glucose-Capillary: 124 mg/dL — ABNORMAL HIGH (ref 70–99)

## 2014-11-13 MED ORDER — ENOXAPARIN SODIUM 30 MG/0.3ML ~~LOC~~ SOLN
30.0000 mg | SUBCUTANEOUS | Status: DC
  Administered 2014-11-13: 30 mg via SUBCUTANEOUS
  Filled 2014-11-13: qty 0.3

## 2014-11-13 MED ORDER — FUROSEMIDE 10 MG/ML IJ SOLN
40.0000 mg | Freq: Every day | INTRAMUSCULAR | Status: DC
  Administered 2014-11-13 – 2014-11-15 (×3): 40 mg via INTRAVENOUS
  Filled 2014-11-13 (×3): qty 4

## 2014-11-13 MED ORDER — HALOPERIDOL LACTATE 5 MG/ML IJ SOLN
1.0000 mg | INTRAMUSCULAR | Status: DC | PRN
  Administered 2014-11-13 (×2): 4 mg via INTRAVENOUS
  Filled 2014-11-13 (×2): qty 1

## 2014-11-13 MED ORDER — HALOPERIDOL LACTATE 5 MG/ML IJ SOLN
5.0000 mg | Freq: Once | INTRAMUSCULAR | Status: AC
  Administered 2014-11-13: 5 mg via INTRAVENOUS
  Filled 2014-11-13: qty 1

## 2014-11-13 MED ORDER — FENTANYL CITRATE 0.05 MG/ML IJ SOLN: 25.0000 ug | Freq: Once | INTRAMUSCULAR | Status: DC

## 2014-11-13 NOTE — Consult Note (Signed)
WOC ostomy follow up Stoma type/location: LLQ Colostomy Stomal assessment/size: 1 and 1/8 inch x 1 and 3/4 inch oval with mild elevation. Pale pink. Peristomal assessment: intact, clear.  Mild depression warrants placement of a skin barrier ring Treatment options for stomal/peristomal skin: skin barrier ring Output: tan, liquid stool Ostomy pouching: 2pc., 2 and 3/4 inch pouching system with skin barrier ring Education provided: None today. Enrolled patient in [employer] Discharge program: No Wife has performed an ostomy pouch change, as [facility] PA and I were at bedside together today with wound, I do not involve her ostomy care today  WOC wound follow up Wound type:Midline surgical.  Seen today with Zola ButtonWill Annett, PA for [facility].  Wound with false bottom and it is explored today to reveal 5cm depth. Open to fascia; sutures palpable. Measurement: 13cm x 5cm x 5cm  Wound YNW:GNFAOZHbed:friable, red, moist Drainage (amount, consistency, odor) serosanguinous Periwound:intact Dressing procedure/placement/frequency: Discontinue NPWT at this time in favor of twice daily saline dressings. Wife in room, aware of findings.    WOC nursing team will follow along with you, and will remain available to this patient, the nursing, surgical and medical teams.   Thanks, Ladona MowLaurie Nakota Ackert, MSN, RN, GNP, [place], [facility]AP [phone])

## 2014-11-13 NOTE — Progress Notes (Signed)
5 Days Post-Op  Subjective: He looks good and comfortable on vent.  WBC is better but we found some opening in the open wound and opened it down to fascia.  Sutures are palpable, and loose but no peritoneal fluid in wound.    Objective: Vital signs in last 24 hours: Temp:  [99.6 F (37.6 C)-101.8 F (38.8 C)] 100 F (37.8 C) (01/27 0000) Pulse Rate:  [93-105] 93 (01/26 2251) Resp:  [10-34] 25 (01/27 0700) BP: (110-183)/(52-103) 157/91 mmHg (01/27 0700) SpO2:  [88 %-98 %] 94 % (01/27 0700) FiO2 (%):  [40 %-50 %] 50 % (01/27 0420) Weight:  [121 kg (266 lb 12.1 oz)] 121 kg (266 lb 12.1 oz) (01/27 0500) Last BM Date: 11/12/14 (colostomy) 1950 TF thru NG 200 recorded from the ostomy Tm 100 last PM  RR in the mid 20's  BP up  Currently on cipro and Flagy. WBC is improving H/h is stableP ok Intake/Output from previous day: 01/26 0701 - 01/27 0700 In: 2850 [I.V.:350; NG/GT:1950; IV Piggyback:550] Out: 2825 [Urine:2425; Drains:200; Stool:200] Intake/Output this shift:    General appearance: cooperative and We just gave him fentanyl to work on wounds.  so he is a bit sedated. Resp: clear to auscultation bilaterally and anterior GI: We opened lower abdominal wound, it is about 5 cm deep and 13 cm long..Sutures are at the base, they are loose but intact.  it's a little bloody but no purulent material expressed with opening of the wound.    Lab Results:   Recent Labs  11/12/14 0550 11/13/14 0620  WBC 8.5 9.4  HGB 11.3* 11.8*  HCT 35.8* 35.3*  PLT 431* 375    BMET  Recent Labs  11/12/14 0550 11/13/14 0620  NA 139 141  K 3.9 3.6  CL 105 107  CO2 28 27  GLUCOSE 111* 151*  BUN 28* 24*  CREATININE 0.98 0.81  CALCIUM 8.4 8.6   PT/INR No results for input(s): LABPROT, INR in the last 72 hours.   Recent Labs Lab 11/07/14 0350 11/11/14 0450  AST 21 34  ALT 34 43  ALKPHOS 78 96  BILITOT 0.7 0.4  PROT 6.3 6.1  ALBUMIN 2.9* 2.6*     Lipase     Component Value  Date/Time   LIPASE 23 10/27/2014 1810     Studies/Results: Ct Chest W Contrast  11/11/2014   CLINICAL DATA:  Febrile, abscess.  EXAM: CT CHEST, ABDOMEN, AND PELVIS WITH CONTRAST  TECHNIQUE: Multidetector CT imaging of the chest, abdomen and pelvis was performed following the standard protocol during bolus administration of intravenous contrast.  CONTRAST:  OMNIPAQUE IOHEXOL 300 MG/ML  SOLN  COMPARISON:  10/27/2014.  FINDINGS: CT CHEST FINDINGS  CT CHEST FINDINGS  Mediastinum/Nodes: Tracheostomy is midline. Mediastinal and hilar lymph nodes are not enlarged by CT size criteria. No axillary adenopathy. Left PICC terminates in the low SVC. Heart is mildly enlarged. No pericardial effusion. Coronary artery calcification.  Lungs/Pleura: Small bilateral pleural effusions with collapse/ consolidation in both lower lobes. Mild volume loss is seen dependently in both upper lobes. Image quality is degraded by expiratory phase imaging and respiratory motion. Probable 7 mm subpleural lymph node along the minor fissure, stable. Airway is otherwise unremarkable.  Musculoskeletal: No worrisome lytic or sclerotic lesions. Degenerative changes are seen in the spine.  CT ABDOMEN AND PELVIS FINDINGS  Hepatobiliary: Liver and gallbladder are unremarkable. No biliary duct dilatation.  Pancreas: Negative.  Spleen: Negative.  Adrenals/Urinary Tract: Adrenal glands are unremarkable. Stones  are seen in the kidneys bilaterally. Ureters are decompressed. Bladder is low in volume with a Foley catheter in place.  Stomach/Bowel: Nasogastric tube terminates in the stomach. Stomach, small bowel and appendix are otherwise unremarkable. Left lower quadrant colostomy. Residual rectosigmoid colon is unremarkable.  Vascular/Lymphatic: Atherosclerotic calcification of the arterial vasculature without abdominal aortic aneurysm. A 1.5 cm short axis left external iliac lymph node is unchanged and may be reactive. Otherwise, no pathologically  enlarged lymph nodes.  Reproductive: Prostate is normal in size.  Other: 2 small collections of fluid are seen in the left paracolic gutter and appear to have thin hyper attenuating rims (1.4 x 2.5 cm, series 9, image 84 and 2.4 x 2.6 cm, series 9, image 93). A phlegmonous collection of inflammatory stranding and residual air is seen in the anterior left anatomic pelvis, at the site of previously seen diverticular abscess. Inflammatory changes extend into the left inguinal canal. No organized fluid collection in association. Postoperative changes of a periumbilical hernia repair are seen in the midline ventral abdominal wall, with residual locules of subcutaneous air. Minimal presacral edema. No dependent free fluid. Mesenteries and peritoneum are otherwise unremarkable.  Musculoskeletal: No worrisome lytic or sclerotic lesions. Degenerative changes are seen in the spine.  IMPRESSION: 1. Pulmonary parenchymal collapse/consolidation in both lower lobes, worrisome for pneumonia. Aspiration is not excluded. 2. Possible small abscesses in the left paracolic gutter with residual phlegmon in the anterior left anatomic pelvis, at the site of previously seen diverticular abscess. 3. Postoperative changes of partial colectomy, left lower quadrant colostomy and periumbilical hernia repair. 4. Small bilateral pleural effusions. 5. Bilateral renal stones. 6. Small bilateral inguinal hernias.   Electronically Signed   By: Leanna Battles M.D.   On: 11/11/2014 15:06   Ct Abdomen Pelvis W Contrast  11/11/2014   CLINICAL DATA:  Febrile, abscess.  EXAM: CT CHEST, ABDOMEN, AND PELVIS WITH CONTRAST  TECHNIQUE: Multidetector CT imaging of the chest, abdomen and pelvis was performed following the standard protocol during bolus administration of intravenous contrast.  CONTRAST:  OMNIPAQUE IOHEXOL 300 MG/ML  SOLN  COMPARISON:  10/27/2014.  FINDINGS: CT CHEST FINDINGS  CT CHEST FINDINGS  Mediastinum/Nodes: Tracheostomy is midline.  Mediastinal and hilar lymph nodes are not enlarged by CT size criteria. No axillary adenopathy. Left PICC terminates in the low SVC. Heart is mildly enlarged. No pericardial effusion. Coronary artery calcification.  Lungs/Pleura: Small bilateral pleural effusions with collapse/ consolidation in both lower lobes. Mild volume loss is seen dependently in both upper lobes. Image quality is degraded by expiratory phase imaging and respiratory motion. Probable 7 mm subpleural lymph node along the minor fissure, stable. Airway is otherwise unremarkable.  Musculoskeletal: No worrisome lytic or sclerotic lesions. Degenerative changes are seen in the spine.  CT ABDOMEN AND PELVIS FINDINGS  Hepatobiliary: Liver and gallbladder are unremarkable. No biliary duct dilatation.  Pancreas: Negative.  Spleen: Negative.  Adrenals/Urinary Tract: Adrenal glands are unremarkable. Stones are seen in the kidneys bilaterally. Ureters are decompressed. Bladder is low in volume with a Foley catheter in place.  Stomach/Bowel: Nasogastric tube terminates in the stomach. Stomach, small bowel and appendix are otherwise unremarkable. Left lower quadrant colostomy. Residual rectosigmoid colon is unremarkable.  Vascular/Lymphatic: Atherosclerotic calcification of the arterial vasculature without abdominal aortic aneurysm. A 1.5 cm short axis left external iliac lymph node is unchanged and may be reactive. Otherwise, no pathologically enlarged lymph nodes.  Reproductive: Prostate is normal in size.  Other: 2 small collections of fluid are seen  in the left paracolic gutter and appear to have thin hyper attenuating rims (1.4 x 2.5 cm, series 9, image 84 and 2.4 x 2.6 cm, series 9, image 93). A phlegmonous collection of inflammatory stranding and residual air is seen in the anterior left anatomic pelvis, at the site of previously seen diverticular abscess. Inflammatory changes extend into the left inguinal canal. No organized fluid collection in  association. Postoperative changes of a periumbilical hernia repair are seen in the midline ventral abdominal wall, with residual locules of subcutaneous air. Minimal presacral edema. No dependent free fluid. Mesenteries and peritoneum are otherwise unremarkable.  Musculoskeletal: No worrisome lytic or sclerotic lesions. Degenerative changes are seen in the spine.  IMPRESSION: 1. Pulmonary parenchymal collapse/consolidation in both lower lobes, worrisome for pneumonia. Aspiration is not excluded. 2. Possible small abscesses in the left paracolic gutter with residual phlegmon in the anterior left anatomic pelvis, at the site of previously seen diverticular abscess. 3. Postoperative changes of partial colectomy, left lower quadrant colostomy and periumbilical hernia repair. 4. Small bilateral pleural effusions. 5. Bilateral renal stones. 6. Small bilateral inguinal hernias.   Electronically Signed   By: Leanna BattlesMelinda  Blietz M.D.   On: 11/11/2014 15:06    Medications: . antiseptic oral rinse  7 mL Mouth Rinse QID  . bacitracin   Topical BID  . chlorhexidine  15 mL Mouth Rinse BID  . guaiFENesin  10 mL Per Tube BID  . heparin  5,000 Units Subcutaneous 3 times per day  . insulin aspart  0-20 Units Subcutaneous 6 times per day  . insulin glargine  15 Units Subcutaneous Daily  . levofloxacin (LEVAQUIN) IV  750 mg Intravenous Q24H  . levothyroxine  150 mcg Per Tube QAC breakfast  . lip balm  1 application Topical BID  . metoprolol tartrate  25 mg Per Tube BID  . metronidazole  500 mg Intravenous Q8H  . nystatin   Topical BID  . pantoprazole sodium  40 mg Per Tube Q1200  . sodium chloride  10-40 mL Intracatheter Q12H    Assessment/Plan 1. Sigmoid diverticulitis, Perforated Bowel EXPLORATORY LAPAROTOMY, PARTIAL COLECTOMY Sigmoid, COLOSTOMY,  Repair umbilical hernia, 10/28/2014, Chevis PrettyPaul Toth III, MD.  2. Sepsis with hypotension 3.Ventilator dependant respiratory failure/EXTUBATED/RE intubated 11/02/14,  /tracheostomy1/22/16 4. Hx of hypertension/hypotensio after surgery, previously requiring pressors 5. Hypothyroid TSH 16 6. Hx of tobacco use 30 years (quit 9 years ago) 7. Body mass index is 38.51 kg  8. Hx of nephrolithiasis 9. Heparin for DVT prophylaxis 10. Malnutrition- tube feeds at goal.    Plan:  I have open wound and will do wet to dry dressing for now.  Ostomy is looking fine and working.  Vent per CCM.    LOS: 17 days    Aguilar,Charletta Voight 11/13/2014

## 2014-11-13 NOTE — Progress Notes (Signed)
Patient ID: William [name], male   DOB: 1951-04-27, 64 y.o.   MRN: [id number]         [facility]    Date of Admission:  10/27/2014   Total days of antibiotics 6 (second course)        Day 1 levofloxacin        Day 1 metronidazole         Principal Problem:   Fever Active Problems:   Diverticulitis of colon with perforation s/p colectomy/colostomy 10/28/2014   Acute respiratory failure with hypoxia   Rash   Abscess of suppurative peritonitis   Bacteroides fragilis infection   Solitary pulmonary nodule on lung CT   Sleep apnea   Normocytic anemia   Obesity   . antiseptic oral rinse  7 mL Mouth Rinse QID  . bacitracin   Topical BID  . chlorhexidine  15 mL Mouth Rinse BID  . enoxaparin (LOVENOX) injection  30 mg Subcutaneous Q24H  . furosemide  40 mg Intravenous Daily  . guaiFENesin  10 mL Per Tube BID  . insulin aspart  0-20 Units Subcutaneous 6 times per day  . insulin glargine  15 Units Subcutaneous Daily  . levofloxacin (LEVAQUIN) IV  750 mg Intravenous Q24H  . levothyroxine  150 mcg Per Tube QAC breakfast  . lip balm  1 application Topical BID  . metoprolol tartrate  25 mg Per Tube BID  . metronidazole  500 mg Intravenous Q8H  . nystatin   Topical BID  . pantoprazole sodium  40 mg Per Tube Q1200  . sodium chloride  10-40 mL Intracatheter Q12H    OBJECTIVE: Blood pressure 160/80, pulse 96, temperature 99.7 F (37.6 C), temperature source Oral, resp. rate 27, height 5' 11.5" (1.816 m), weight 266 lb 12.1 oz (121 kg), SpO2 91 %.   General: He is tolerating trach collar oxygen Skin: Fading erythematous rash Lungs: Clear anteriorly. He is requiring less suctioning over the last 12 hours Cor: Distant but regular S1 and S2 with no murmur heard Abdomen: Soft. Vac dressing removed from lower midline wound this morning. Wet-to-dry dressing in place. Left lower quadrant colostomy  Lab Results Lab Results  Component Value Date   WBC 9.4  11/13/2014   HGB 11.8* 11/13/2014   HCT 35.3* 11/13/2014   MCV 96.2 11/13/2014   PLT 375 11/13/2014    Lab Results  Component Value Date   CREATININE 0.81 11/13/2014   BUN 24* 11/13/2014   NA 141 11/13/2014   K 3.6 11/13/2014   CL 107 11/13/2014   CO2 27 11/13/2014    Lab Results  Component Value Date   ALT 43 11/11/2014   AST 34 11/11/2014   ALKPHOS 96 11/11/2014   BILITOT 0.4 11/11/2014     Microbiology: Recent Results (from the past 240 hour(s))  Culture, respiratory (NON-Expectorated)     Status: None   Collection Time: 11/08/14  9:45 AM  Result Value Ref Range Status   Specimen Description TRACHEAL ASPIRATE  Final   Special Requests Normal  Final   Gram Stain   Final    MODERATE WBC PRESENT, PREDOMINANTLY PMN RARE SQUAMOUS EPITHELIAL CELLS PRESENT FEW GRAM POSITIVE COCCI IN PAIRS Performed at [facility]    Culture   Final    MODERATE STAPHYLOCOCCUS AUREUS Note: RIFAMPIN AND GENTAMICIN SHOULD NOT BE USED AS SINGLE DRUGS FOR TREATMENT OF STAPH INFECTIONS. Performed at [facility]    Report Status 11/11/2014 FINAL  Final  Organism ID, Bacteria STAPHYLOCOCCUS AUREUS  Final      Susceptibility   Staphylococcus aureus - MIC*    CLINDAMYCIN <=0.25 SENSITIVE Sensitive     ERYTHROMYCIN 0.5 SENSITIVE Sensitive     GENTAMICIN <=0.5 SENSITIVE Sensitive     LEVOFLOXACIN 0.25 SENSITIVE Sensitive     OXACILLIN <=0.25 SENSITIVE Sensitive     PENICILLIN 0.12 SENSITIVE Sensitive     RIFAMPIN <=0.5 SENSITIVE Sensitive     TRIMETH/SULFA <=10 SENSITIVE Sensitive     VANCOMYCIN 1 SENSITIVE Sensitive     TETRACYCLINE <=1 SENSITIVE Sensitive     MOXIFLOXACIN <=0.25 SENSITIVE Sensitive     * MODERATE STAPHYLOCOCCUS AUREUS  Culture, blood (routine x 2)     Status: None   Collection Time: 11/08/14 10:48 AM  Result Value Ref Range Status   Specimen Description BLOOD RIGHT HAND  Final   Special Requests   Final    BOTTLES DRAWN AEROBIC AND ANAEROBIC 6ML AER  4ML ANA   Culture   Final    BACTEROIDES FRAGILIS Note: BETA LACTAMASE POSITIVE Note: Gram Stain Report Called to,Read Back By and Verified With: Marita KansasBrianne W. RN on 11/10/14 at 05:05 by Christie NottinghamAnne Skeen [facility]    Report Status 11/12/2014 FINAL  Final  Culture, blood (routine x 2)     Status: None   Collection Time: 11/08/14 10:55 AM  Result Value Ref Range Status   Specimen Description BLOOD RIGHT HAND  Final   Special Requests BOTTLES DRAWN AEROBIC AND ANAEROBIC 5ML  Final   Culture   Final    BACTEROIDES FRAGILIS Note: BETA LACTAMASE POSITIVE Note: Gram Stain Report Called to,Read Back By and Verified With: Marita KansasBRIANNE W RN @ [phone] [facility] 11/09/14 [facility]formed at [facility]    Report Status 11/12/2014 FINAL  Final  Culture, Urine     Status: None   Collection Time: 11/11/14 10:55 AM  Result Value Ref Range Status   Specimen Description URINE, CATHETERIZED  Final   Special Requests NONE  Final   Colony Count NO GROWTH [facility]   Final   Culture NO GROWTH [facility] at [facility]   Final   Report Status 11/12/2014 FINAL  Final    Assessment: He is showing some improvement on therapy for peritonitis and residual abscesses caused by sigmoid perforation and probable healthcare associated pneumonia.  Plan: 1. Continue current antibiotics 2. I discussed the situation with his wife and son  Cliffton AstersJohn Basim Bartnik, MD [facility] [phone] pager   [phone] cell 11/13/2014, 10:17 AM

## 2014-11-13 NOTE — Progress Notes (Signed)
OT Cancellation Note  Patient Details Name: William [name] MRN: [id number] DOB: 12/03/1950   Cancelled Treatment:    Reason Eval/Treat Not Completed: Other (comment).  Pt just put back on vent after being off for 5 hours per RN.  She said that he is inconsistently following commands.  Will check back  William [name] 11/13/2014, 3:51 PM  William [name], OTR/L [phone] 11/13/2014

## 2014-11-13 NOTE — Progress Notes (Addendum)
PT Cancellation Note  Patient Details Name: William [name] MRN: [id number] DOB: 10/08/1951   Cancelled Treatment:    Reason Eval/Treat Not Completed: Medical issues which prohibited therapy (on trach collar, now back on vent, will return in AM)   Rada HayHill, Denny Lave Elizabeth 11/13/2014, 4:59 PM

## 2014-11-13 NOTE — Progress Notes (Signed)
eLink Physician-Brief Progress Note Patient Name: [name] DOB: 02/05/1951 MRN: [id number]   Date of Service  11/13/2014  HPI/Events of Note  Agitated.  Not improved with ativan, fentanyl.  eICU Interventions  Will give haldol IV x one.     Intervention Category Major Interventions: Other:  Naheem Mosco 11/13/2014, 1:56 AM

## 2014-11-13 NOTE — Progress Notes (Signed)
PULMONARY / CRITICAL CARE MEDICINE   Name: William [name] MRN: [id number] DOB: 1951/01/01    ADMISSION DATE:  10/27/2014  CHIEF COMPLAINT:  Perforated sigmoid colon  BRIEF PATIENT DESCRIPTION:   64 y/o male former smoker presented with abd pain from perforated sigmoid colon taken to OR emergently.  Remained on vent post op.  Failed extubation 1/16, reintubated. Course complicated by vent dependence requiring trach , GNR bacteremia    SIGNIFICANT EVENTS:  1/10  Admit, Partial colectomy  1/13  Off pressors 1/14  On TNA 1/16  Extubated >> reintubated in PM 1/19  Agitation, hypertension overnight, sedation changed to propofol. 60% / 8 1/20 PEEP weaned to 5, FiO2 40%, diuresing 1/22 more hypoxemic, febrile, plan tracheostomy 1/24 rash- benadryl - zosyn stopped  STUDIES: 1/10  CT abd/pelvis >> sigmoid colon diverticulitis with perforation, umbilical hernia, b/l renal stones, 8 mm RLL nodule 1/11  ECHO >> EF 55-60%, nml systolic fxn, PA peak 32   1/17  CT Head >> no acute intracranial abnormality, ? Small vessel ischemic changes, L maxillary sinus disease 1/25 CT abd >> collapse/consolidation in both lower lobes, Possible small abscesses in the left paracolic gutter with residual phlegmon in the anterior left anatomic pelvis, at the site of previously seen diverticular abscess.  SUBJECTIVE:   Low gr fever Weaned x 1m on ATC , then became hypoxic Tolerating TFs RASS 0- off  fent gtt, given haldol for agitation overnight   VITAL SIGNS: Temp:  [99.6 F (37.6 C)-101.8 F (38.8 C)] 99.7 F (37.6 C) (01/27 0800) Pulse Rate:  [93-105] 93 (01/26 2251) Resp:  [10-34] 22 (01/27 0800) BP: (110-183)/(52-103) 160/80 mmHg (01/27 0800) SpO2:  [88 %-97 %] 96 % (01/27 0800) FiO2 (%):  [40 %-50 %] 50 % (01/27 0800) Weight:  [121 kg (266 lb 12.1 oz)] 121 kg (266 lb 12.1 oz) (01/27 0500)   VENTILATOR SETTINGS: Vent Mode:  [-] PRVC FiO2 (%):  [40 %-50 %] 50 % Set Rate:  [18 bmp] 18 bmp Vt Set:   [610 mL] 610 mL PEEP:  [5 cmH20] 5 cmH20 Pressure Support:  [5 cmH20] 5 cmH20 Plateau Pressure:  [13 cmH20-15 cmH20] 14 cmH20   INTAKE / OUTPUT: Intake/Output      01/26 0701 - 01/27 0700 01/27 0701 - 01/28 0700   I.V. (mL/kg) 350 (2.9) 10 (0.1)   Other 0    NG/GT 1950 75   IV Piggyback 550    Total Intake(mL/kg) 2850 (23.6) 85 (0.7)   Urine (mL/kg/hr) 2425 (0.8) 115 (0.4)   Emesis/NG output 0 (0)    Drains 200 (0.1)    Stool 200 (0.1)    Total Output 2825 115   Net +25 -30          PHYSICAL EXAMINATION:  Gen: sedated this am for wound vac change HEENT: NCAT EOMi, trach PULM:  rhonchi bases CV: tachy, no mgr AB: infrequent bowel sounds, midline scar, wound vac Derm: macular erythematous rash over trunk & BL thighs -less erythema Ext: SCD Neuro: grimace with stim, does not follow commands  LABS:  CBC  Recent Labs Lab 11/11/14 0450 11/12/14 0550 11/13/14 0620  WBC 7.7 8.5 9.4  HGB 11.3* 11.3* 11.8*  HCT 35.9* 35.8* 35.3*  PLT 467* 431* 375   BMET  Recent Labs Lab 11/11/14 0450 11/12/14 0550 11/13/14 0620  NA 143 139 141  K 3.3* 3.9 3.6  CL 105 105 107  CO2 BUN 30* 28* 24*  CREATININE 1.04  0.98 0.81  GLUCOSE 129* 111* 151*   Electrolytes  Recent Labs Lab 11/07/14 0350  11/11/14 0450 11/12/14 0550 11/13/14 0620  CALCIUM 8.2*  < > 8.2* 8.4 8.6  MG 2.2  --  2.0  --   --   PHOS 3.5  --  3.6  --   --   < > = values in this interval not displayed. Sepsis Markers No results for input(s): LATICACIDVEN, PROCALCITON, O2SATVEN in the last 168 hours. ABG No results for input(s): PHART, PCO2ART, PO2ART in the last 168 hours. Liver Enzymes  Recent Labs Lab 11/07/14 0350 11/11/14 0450  AST 21 34  ALT 34 43  ALKPHOS 78 96  BILITOT 0.7 0.4  ALBUMIN 2.9* 2.6*   Glucose  Recent Labs Lab 11/12/14 1143 11/12/14 1555 11/12/14 2100 11/12/14 2340 11/13/14 0418 11/13/14 0825  GLUCAP 155* 117* 119* 124* 130* 118*    Imaging Ct  Chest W Contrast  11/11/2014   CLINICAL DATA:  Febrile, abscess.  EXAM: CT CHEST, ABDOMEN, AND PELVIS WITH CONTRAST  TECHNIQUE: Multidetector CT imaging of the chest, abdomen and pelvis was performed following the standard protocol during bolus administration of intravenous contrast.  CONTRAST:  OMNIPAQUE IOHEXOL 300 MG/ML  SOLN  COMPARISON:  10/27/2014.  FINDINGS: CT CHEST FINDINGS  CT CHEST FINDINGS  Mediastinum/Nodes: Tracheostomy is midline. Mediastinal and hilar lymph nodes are not enlarged by CT size criteria. No axillary adenopathy. Left PICC terminates in the low SVC. Heart is mildly enlarged. No pericardial effusion. Coronary artery calcification.  Lungs/Pleura: Small bilateral pleural effusions with collapse/ consolidation in both lower lobes. Mild volume loss is seen dependently in both upper lobes. Image quality is degraded by expiratory phase imaging and respiratory motion. Probable 7 mm subpleural lymph node along the minor fissure, stable. Airway is otherwise unremarkable.  Musculoskeletal: No worrisome lytic or sclerotic lesions. Degenerative changes are seen in the spine.  CT ABDOMEN AND PELVIS FINDINGS  Hepatobiliary: Liver and gallbladder are unremarkable. No biliary duct dilatation.  Pancreas: Negative.  Spleen: Negative.  Adrenals/Urinary Tract: Adrenal glands are unremarkable. Stones are seen in the kidneys bilaterally. Ureters are decompressed. Bladder is low in volume with a Foley catheter in place.  Stomach/Bowel: Nasogastric tube terminates in the stomach. Stomach, small bowel and appendix are otherwise unremarkable. Left lower quadrant colostomy. Residual rectosigmoid colon is unremarkable.  Vascular/Lymphatic: Atherosclerotic calcification of the arterial vasculature without abdominal aortic aneurysm. A 1.5 cm short axis left external iliac lymph node is unchanged and may be reactive. Otherwise, no pathologically enlarged lymph nodes.  Reproductive: Prostate is normal in size.   Other: 2 small collections of fluid are seen in the left paracolic gutter and appear to have thin hyper attenuating rims (1.4 x 2.5 cm, series 9, image 84 and 2.4 x 2.6 cm, series 9, image 93). A phlegmonous collection of inflammatory stranding and residual air is seen in the anterior left anatomic pelvis, at the site of previously seen diverticular abscess. Inflammatory changes extend into the left inguinal canal. No organized fluid collection in association. Postoperative changes of a periumbilical hernia repair are seen in the midline ventral abdominal wall, with residual locules of subcutaneous air. Minimal presacral edema. No dependent free fluid. Mesenteries and peritoneum are otherwise unremarkable.  Musculoskeletal: No worrisome lytic or sclerotic lesions. Degenerative changes are seen in the spine.  IMPRESSION: 1. Pulmonary parenchymal collapse/consolidation in both lower lobes, worrisome for pneumonia. Aspiration is not excluded. 2. Possible small abscesses in the left paracolic gutter with residual phlegmon  in the anterior left anatomic pelvis, at the site of previously seen diverticular abscess. 3. Postoperative changes of partial colectomy, left lower quadrant colostomy and periumbilical hernia repair. 4. Small bilateral pleural effusions. 5. Bilateral renal stones. 6. Small bilateral inguinal hernias.   Electronically Signed   By: Leanna BattlesMelinda  Blietz M.D.   On: 11/11/2014 15:06   Ct Abdomen Pelvis W Contrast  11/11/2014   CLINICAL DATA:  Febrile, abscess.  EXAM: CT CHEST, ABDOMEN, AND PELVIS WITH CONTRAST  TECHNIQUE: Multidetector CT imaging of the chest, abdomen and pelvis was performed following the standard protocol during bolus administration of intravenous contrast.  CONTRAST:  100mL OMNIPAQUE IOHEXOL 300 MG/ML  SOLN  COMPARISON:  10/27/2014.  FINDINGS: CT CHEST FINDINGS  CT CHEST FINDINGS  Mediastinum/Nodes: Tracheostomy is midline. Mediastinal and hilar lymph nodes are not enlarged by CT size  criteria. No axillary adenopathy. Left PICC terminates in the low SVC. Heart is mildly enlarged. No pericardial effusion. Coronary artery calcification.  Lungs/Pleura: Small bilateral pleural effusions with collapse/ consolidation in both lower lobes. Mild volume loss is seen dependently in both upper lobes. Image quality is degraded by expiratory phase imaging and respiratory motion. Probable 7 mm subpleural lymph node along the minor fissure, stable. Airway is otherwise unremarkable.  Musculoskeletal: No worrisome lytic or sclerotic lesions. Degenerative changes are seen in the spine.  CT ABDOMEN AND PELVIS FINDINGS  Hepatobiliary: Liver and gallbladder are unremarkable. No biliary duct dilatation.  Pancreas: Negative.  Spleen: Negative.  Adrenals/Urinary Tract: Adrenal glands are unremarkable. Stones are seen in the kidneys bilaterally. Ureters are decompressed. Bladder is low in volume with a Foley catheter in place.  Stomach/Bowel: Nasogastric tube terminates in the stomach. Stomach, small bowel and appendix are otherwise unremarkable. Left lower quadrant colostomy. Residual rectosigmoid colon is unremarkable.  Vascular/Lymphatic: Atherosclerotic calcification of the arterial vasculature without abdominal aortic aneurysm. A 1.5 cm short axis left external iliac lymph node is unchanged and may be reactive. Otherwise, no pathologically enlarged lymph nodes.  Reproductive: Prostate is normal in size.  Other: 2 small collections of fluid are seen in the left paracolic gutter and appear to have thin hyper attenuating rims (1.4 x 2.5 cm, series 9, image 84 and 2.4 x 2.6 cm, series 9, image 93). A phlegmonous collection of inflammatory stranding and residual air is seen in the anterior left anatomic pelvis, at the site of previously seen diverticular abscess. Inflammatory changes extend into the left inguinal canal. No organized fluid collection in association. Postoperative changes of a periumbilical hernia repair  are seen in the midline ventral abdominal wall, with residual locules of subcutaneous air. Minimal presacral edema. No dependent free fluid. Mesenteries and peritoneum are otherwise unremarkable.  Musculoskeletal: No worrisome lytic or sclerotic lesions. Degenerative changes are seen in the spine.  IMPRESSION: 1. Pulmonary parenchymal collapse/consolidation in both lower lobes, worrisome for pneumonia. Aspiration is not excluded. 2. Possible small abscesses in the left paracolic gutter with residual phlegmon in the anterior left anatomic pelvis, at the site of previously seen diverticular abscess. 3. Postoperative changes of partial colectomy, left lower quadrant colostomy and periumbilical hernia repair. 4. Small bilateral pleural effusions. 5. Bilateral renal stones. 6. Small bilateral inguinal hernias.   Electronically Signed   By: Leanna BattlesMelinda  Blietz M.D.   On: 11/11/2014 15:06    ASSESSMENT / PLAN:  PULMONARY ETT 1/10 >> 1/16, 1/16 >> 1/22 Trach (Dr Jenne PaneBates) 1/22 >>  A: Acute respiratory failure with hypoxemia, worse 1/21> suspect HCAP, +/- pulm edema (net even hospitalization)  Prolonged Vent course> tracheostomy 1/22 Hx of tobacco abuse with 8 mm RLL nodule on CT abd/pelvis - will need outpt FU Hx of OSA P:   SBTs with goal ATC daytime Continue guaifenesin, pulm toilette  CARDIOVASCULAR L femoral CVC 1/11 >> 1/18 RUE PICC 1/13 >> 1/18 (coiled) LUE PICC 1/18 >>  A: Septic shock 2nd to peritonitis >> resolved Demand ischemia > resolved HTN P:   Hold outpatient lisinopril Continue metoprolol 25mg  bid as BP permits   RENAL A: Lactic Acidosis  - resolved P:   Monitor renal fx, urine outpt, electrolytes Resume lasix 40 daily  GASTROINTESTINAL A: Perforated Sigmoid s/p Partial Colectomy (1/10) Protein calorie malnutrition P:   Post-op care, nutrition per [facility] Protonix for SUP TNA / Lipids stopped 1/24  HEMATOLOGIC A: Anemia of critical illness P: Intermittent CBC, transfuse  if Hgb < 7gm/dL SQ heparin for DVT prevention  INFECTIOUS A: Septic shock 2nd to peritonitis from perforated sigmoid colon, B frag.bacteremia Left Maxillary Sinus Disease  MSSA HCAP 1/22 Drug rash  P:   7 days zosyn, micafungin > d/c'd 1/18 Vanco 1/22 >> 1/24, 1/25 >>1/26 Zosyn 1/22 >> 1/24 (rash) cipro 1/24 >> 1/26 levaquin 1/26 >> Fluconazole  1/23 >> 1/25 (rash) for thrush and skin yeast Flagyl 1/26 >>   Blood Cx 1/11 >> neg Blood Cx 1/22 >> B frag  Resp Cx 1/22 >>MSSA   Appreciate ID input -simplified to levaquin/ flagyl  ENDOCRINE A: Hyperglycemia Hx of hypothyroidism P:   SSI  Change synthroid per tube  NEUROLOGIC A: Post-op pain control Agitation - CT head 1/17 neg for acute abnormality P: RASS Goal: 0 Supportive care:  Minimize sedation as able, promote sleep wake cycle, mobilize etc  Fentanyl int, haldol prn  Family: Wife updated bedside 1/27  Today's Summary : Wean to ATC, GNR bacteremia complicated by rash  - now improving  Care during the described time interval was provided by me and/or other providers on the critical care team.  I have reviewed this patient's available data, including medical history, events of note, physical examination and test results as part of my evaluation  CC time x 21m  ALVA,RAKESH V. MD  11/13/2014, 9:19 AM

## 2014-11-13 NOTE — Progress Notes (Signed)
5 Days Post-Op  Subjective: Continues on ventilator with trach tube doing well.  Objective: Vital signs in last 24 hours: Temp:  [99.6 F (37.6 C)-101.8 F (38.8 C)] 100 F (37.8 C) (01/27 0000) Pulse Rate:  [93-105] 93 (01/26 2251) Resp:  [10-34] 22 (01/27 0800) BP: (110-183)/(52-103) 160/80 mmHg (01/27 0800) SpO2:  [88 %-98 %] 96 % (01/27 0800) FiO2 (%):  [40 %-50 %] 50 % (01/27 0800) Weight:  [121 kg (266 lb 12.1 oz)] 121 kg (266 lb 12.1 oz) (01/27 0500) Last BM Date: 11/12/14 (colostomy)  Intake/Output from previous day: 01/26 0701 - 01/27 0700 In: 2850 [I.V.:350; NG/GT:1950; IV Piggyback:550] Out: 2825 [Urine:2425; Drains:200; Stool:200] Intake/Output this shift: Total I/O In: 85 [I.V.:10; NG/GT:75] Out: -   General appearance: alert, cooperative and no distress Neck: #8 cuffed Shiley trach tube in place, removed sutures, creamy secretions around tube  Lab Results:   Recent Labs  11/12/14 0550 11/13/14 0620  WBC 8.5 9.4  HGB 11.3* 11.8*  HCT 35.8* 35.3*  PLT 431* 375   BMET  Recent Labs  11/12/14 0550 11/13/14 0620  NA 139 141  K 3.9 3.6  CL 105 107  CO2 28 27  GLUCOSE 111* 151*  BUN 28* 24*  CREATININE 0.98 0.81  CALCIUM 8.4 8.6   PT/INR No results for input(s): LABPROT, INR in the last 72 hours. ABG No results for input(s): PHART, HCO3 in the last 72 hours.  Invalid input(s): PCO2, PO2  Studies/Results: Ct Chest W Contrast  11/11/2014   CLINICAL DATA:  Febrile, abscess.  EXAM: CT CHEST, ABDOMEN, AND PELVIS WITH CONTRAST  TECHNIQUE: Multidetector CT imaging of the chest, abdomen and pelvis was performed following the standard protocol during bolus administration of intravenous contrast.  CONTRAST:  OMNIPAQUE IOHEXOL 300 MG/ML  SOLN  COMPARISON:  10/27/2014.  FINDINGS: CT CHEST FINDINGS  CT CHEST FINDINGS  Mediastinum/Nodes: Tracheostomy is midline. Mediastinal and hilar lymph nodes are not enlarged by CT size criteria. No axillary  adenopathy. Left PICC terminates in the low SVC. Heart is mildly enlarged. No pericardial effusion. Coronary artery calcification.  Lungs/Pleura: Small bilateral pleural effusions with collapse/ consolidation in both lower lobes. Mild volume loss is seen dependently in both upper lobes. Image quality is degraded by expiratory phase imaging and respiratory motion. Probable 7 mm subpleural lymph node along the minor fissure, stable. Airway is otherwise unremarkable.  Musculoskeletal: No worrisome lytic or sclerotic lesions. Degenerative changes are seen in the spine.  CT ABDOMEN AND PELVIS FINDINGS  Hepatobiliary: Liver and gallbladder are unremarkable. No biliary duct dilatation.  Pancreas: Negative.  Spleen: Negative.  Adrenals/Urinary Tract: Adrenal glands are unremarkable. Stones are seen in the kidneys bilaterally. Ureters are decompressed. Bladder is low in volume with a Foley catheter in place.  Stomach/Bowel: Nasogastric tube terminates in the stomach. Stomach, small bowel and appendix are otherwise unremarkable. Left lower quadrant colostomy. Residual rectosigmoid colon is unremarkable.  Vascular/Lymphatic: Atherosclerotic calcification of the arterial vasculature without abdominal aortic aneurysm. A 1.5 cm short axis left external iliac lymph node is unchanged and may be reactive. Otherwise, no pathologically enlarged lymph nodes.  Reproductive: Prostate is normal in size.  Other: 2 small collections of fluid are seen in the left paracolic gutter and appear to have thin hyper attenuating rims (1.4 x 2.5 cm, series 9, image 84 and 2.4 x 2.6 cm, series 9, image 93). A phlegmonous collection of inflammatory stranding and residual air is seen in the anterior left anatomic pelvis, at the site  of previously seen diverticular abscess. Inflammatory changes extend into the left inguinal canal. No organized fluid collection in association. Postoperative changes of a periumbilical hernia repair are seen in the midline  ventral abdominal wall, with residual locules of subcutaneous air. Minimal presacral edema. No dependent free fluid. Mesenteries and peritoneum are otherwise unremarkable.  Musculoskeletal: No worrisome lytic or sclerotic lesions. Degenerative changes are seen in the spine.  IMPRESSION: 1. Pulmonary parenchymal collapse/consolidation in both lower lobes, worrisome for pneumonia. Aspiration is not excluded. 2. Possible small abscesses in the left paracolic gutter with residual phlegmon in the anterior left anatomic pelvis, at the site of previously seen diverticular abscess. 3. Postoperative changes of partial colectomy, left lower quadrant colostomy and periumbilical hernia repair. 4. Small bilateral pleural effusions. 5. Bilateral renal stones. 6. Small bilateral inguinal hernias.   Electronically Signed   By: Leanna Battles M.D.   On: 11/11/2014 15:06   Ct Abdomen Pelvis W Contrast  11/11/2014   CLINICAL DATA:  Febrile, abscess.  EXAM: CT CHEST, ABDOMEN, AND PELVIS WITH CONTRAST  TECHNIQUE: Multidetector CT imaging of the chest, abdomen and pelvis was performed following the standard protocol during bolus administration of intravenous contrast.  CONTRAST:  OMNIPAQUE IOHEXOL 300 MG/ML  SOLN  COMPARISON:  10/27/2014.  FINDINGS: CT CHEST FINDINGS  CT CHEST FINDINGS  Mediastinum/Nodes: Tracheostomy is midline. Mediastinal and hilar lymph nodes are not enlarged by CT size criteria. No axillary adenopathy. Left PICC terminates in the low SVC. Heart is mildly enlarged. No pericardial effusion. Coronary artery calcification.  Lungs/Pleura: Small bilateral pleural effusions with collapse/ consolidation in both lower lobes. Mild volume loss is seen dependently in both upper lobes. Image quality is degraded by expiratory phase imaging and respiratory motion. Probable 7 mm subpleural lymph node along the minor fissure, stable. Airway is otherwise unremarkable.  Musculoskeletal: No worrisome lytic or sclerotic  lesions. Degenerative changes are seen in the spine.  CT ABDOMEN AND PELVIS FINDINGS  Hepatobiliary: Liver and gallbladder are unremarkable. No biliary duct dilatation.  Pancreas: Negative.  Spleen: Negative.  Adrenals/Urinary Tract: Adrenal glands are unremarkable. Stones are seen in the kidneys bilaterally. Ureters are decompressed. Bladder is low in volume with a Foley catheter in place.  Stomach/Bowel: Nasogastric tube terminates in the stomach. Stomach, small bowel and appendix are otherwise unremarkable. Left lower quadrant colostomy. Residual rectosigmoid colon is unremarkable.  Vascular/Lymphatic: Atherosclerotic calcification of the arterial vasculature without abdominal aortic aneurysm. A 1.5 cm short axis left external iliac lymph node is unchanged and may be reactive. Otherwise, no pathologically enlarged lymph nodes.  Reproductive: Prostate is normal in size.  Other: 2 small collections of fluid are seen in the left paracolic gutter and appear to have thin hyper attenuating rims (1.4 x 2.5 cm, series 9, image 84 and 2.4 x 2.6 cm, series 9, image 93). A phlegmonous collection of inflammatory stranding and residual air is seen in the anterior left anatomic pelvis, at the site of previously seen diverticular abscess. Inflammatory changes extend into the left inguinal canal. No organized fluid collection in association. Postoperative changes of a periumbilical hernia repair are seen in the midline ventral abdominal wall, with residual locules of subcutaneous air. Minimal presacral edema. No dependent free fluid. Mesenteries and peritoneum are otherwise unremarkable.  Musculoskeletal: No worrisome lytic or sclerotic lesions. Degenerative changes are seen in the spine.  IMPRESSION: 1. Pulmonary parenchymal collapse/consolidation in both lower lobes, worrisome for pneumonia. Aspiration is not excluded. 2. Possible small abscesses in the left paracolic gutter with  residual phlegmon in the anterior left anatomic  pelvis, at the site of previously seen diverticular abscess. 3. Postoperative changes of partial colectomy, left lower quadrant colostomy and periumbilical hernia repair. 4. Small bilateral pleural effusions. 5. Bilateral renal stones. 6. Small bilateral inguinal hernias.   Electronically Signed   By: Leanna BattlesMelinda  Blietz M.D.   On: 11/11/2014 15:06    Anti-infectives: Anti-infectives    Start     Dose/Rate Route Frequency Ordered Stop   11/12/14 1800  metroNIDAZOLE (FLAGYL) IVPB 500 mg     500 mg100 mL/hr over 60 Minutes Intravenous Every 8 hours 11/12/14 1554     11/12/14 1700  levofloxacin (LEVAQUIN) IVPB 750 mg     750 mg100 mL/hr over 90 Minutes Intravenous Every 24 hours 11/12/14 1554     11/11/14 1800  vancomycin (VANCOCIN) 1,250 mg in sodium chloride 0.9 % 250 mL IVPB  Status:  Discontinued     1,250 mg166.7 mL/hr over 90 Minutes Intravenous Every 12 hours 11/11/14 0957 11/12/14 1554   11/11/14 1100  vancomycin (VANCOCIN) 1,250 mg in sodium chloride 0.9 % 250 mL IVPB     1,250 mg166.7 mL/hr over 90 Minutes Intravenous  Once 11/11/14 0957 11/11/14 1202   11/10/14 0815  ciprofloxacin (CIPRO) IVPB 400 mg  Status:  Discontinued     400 mg200 mL/hr over 60 Minutes Intravenous Every 12 hours 11/10/14 0807 11/12/14 1554   11/09/14 1000  fluconazole (DIFLUCAN) tablet 100 mg  Status:  Discontinued     100 mg Oral Daily 11/09/14 0931 11/11/14 1005   11/08/14 2200  vancomycin (VANCOCIN) 1,250 mg in sodium chloride 0.9 % 250 mL IVPB  Status:  Discontinued     1,250 mg166.7 mL/hr over 90 Minutes Intravenous Every 12 hours 11/08/14 1001 11/10/14 1002   11/08/14 1200  piperacillin-tazobactam (ZOSYN) IVPB 3.375 g  Status:  Discontinued     3.375 g12.5 mL/hr over 240 Minutes Intravenous 3 times per day 11/08/14 1001 11/10/14 2245   11/08/14 1015  vancomycin (VANCOCIN) 2,500 mg in sodium chloride 0.9 % 500 mL IVPB     2,500 mg250 mL/hr over 120 Minutes Intravenous NOW 11/08/14 1001 11/08/14 1238   11/01/14  2000  vancomycin (VANCOCIN) 1,250 mg in sodium chloride 0.9 % 250 mL IVPB  Status:  Discontinued     1,250 mg166.7 mL/hr over 90 Minutes Intravenous Every 12 hours 11/01/14 0702 11/02/14 0823   10/30/14 2200  vancomycin (VANCOCIN) IVPB 1000 mg/200 mL premix  Status:  Discontinued     1,000 mg200 mL/hr over 60 Minutes Intravenous Every 8 hours 10/30/14 1746 11/01/14 0702   10/28/14 1800  vancomycin (VANCOCIN) 1,250 mg in sodium chloride 0.9 % 250 mL IVPB  Status:  Discontinued     1,250 mg166.7 mL/hr over 90 Minutes Intravenous Every 12 hours 10/28/14 0358 10/28/14 0824   10/28/14 1400  vancomycin (VANCOCIN) IVPB 1000 mg/200 mL premix  Status:  Discontinued     1,000 mg200 mL/hr over 60 Minutes Intravenous Every 12 hours 10/28/14 0824 10/30/14 1746   10/28/14 1000  micafungin (MYCAMINE) 100 mg in sodium chloride 0.9 % 100 mL IVPB  Status:  Discontinued     100 mg100 mL/hr over 1 Hours Intravenous Daily 10/28/14 0342 10/28/14 0344   10/28/14 0800  metroNIDAZOLE (FLAGYL) IVPB 500 mg  Status:  Discontinued     500 mg100 mL/hr over 60 Minutes Intravenous Every 8 hours 10/28/14 0335 10/28/14 0343   10/28/14 0600  piperacillin-tazobactam (ZOSYN) IVPB 3.375 g  Status:  Discontinued     3.375 g12.5 mL/hr over 240 Minutes Intravenous 3 times per day 10/28/14 0339 11/04/14 1152   10/28/14 0400  micafungin (MYCAMINE) 100 mg in sodium chloride 0.9 % 100 mL IVPB  Status:  Discontinued     100 mg100 mL/hr over 1 Hours Intravenous Daily 10/28/14 0342 11/04/14 1152   10/28/14 0400  vancomycin (VANCOCIN) 1,750 mg in sodium chloride 0.9 % 500 mL IVPB     1,750 mg250 mL/hr over 120 Minutes Intravenous  Once 10/28/14 0353 10/28/14 0749   10/28/14 0030  [MAR Hold]  metroNIDAZOLE (FLAGYL) IVPB 500 mg     (MAR Hold since 10/28/14 0043)   500 mg100 mL/hr over 60 Minutes Intravenous  Once 10/28/14 0026 10/28/14 0200   10/27/14 2030  piperacillin-tazobactam (ZOSYN) IVPB 3.375 g     3.375 g100 mL/hr over 30 Minutes  Intravenous  Once 10/27/14 2026 10/27/14 2110      Assessment/Plan: s/p Procedure(s): TRACHEOSTOMY (N/A) Sutures removed from trach tube.  Further trach care/trach changes per CCM.  Call with questions.  LOS: 17 days    Carnell Casamento 11/13/2014

## 2014-11-14 DIAGNOSIS — E669 Obesity, unspecified: Secondary | ICD-10-CM

## 2014-11-14 DIAGNOSIS — D649 Anemia, unspecified: Secondary | ICD-10-CM

## 2014-11-14 DIAGNOSIS — L270 Generalized skin eruption due to drugs and medicaments taken internally: Secondary | ICD-10-CM

## 2014-11-14 DIAGNOSIS — K631 Perforation of intestine (nontraumatic): Secondary | ICD-10-CM

## 2014-11-14 LAB — COMPREHENSIVE METABOLIC PANEL
ALBUMIN: 2.8 g/dL — AB (ref 3.5–5.2)
ALK PHOS: 78 U/L (ref 39–117)
ALT: 38 U/L (ref 0–53)
AST: 26 U/L (ref 0–37)
Anion gap: 10 (ref 5–15)
BUN: 31 mg/dL — ABNORMAL HIGH (ref 6–23)
CALCIUM: 9.1 mg/dL (ref 8.4–10.5)
CHLORIDE: 107 mmol/L (ref 96–112)
CO2: 27 mmol/L (ref 19–32)
Creatinine, Ser: 0.86 mg/dL (ref 0.50–1.35)
GFR calc non Af Amer: >90 mL/min (ref >90)
Glucose, Bld: 136 mg/dL — ABNORMAL HIGH (ref 70–99)
Potassium: 3.6 mmol/L (ref 3.5–5.1)
Sodium: 144 mmol/L (ref 135–145)
Total Bilirubin: 0.5 mg/dL (ref 0.3–1.2)
Total Protein: 6.4 g/dL (ref 6.0–8.3)

## 2014-11-14 LAB — GLUCOSE, CAPILLARY
GLUCOSE-CAPILLARY: 142 mg/dL — AB (ref 70–99)
GLUCOSE-CAPILLARY: 114 mg/dL — AB (ref 70–99)
Glucose-Capillary: 102 mg/dL — ABNORMAL HIGH (ref 70–99)
Glucose-Capillary: 146 mg/dL — ABNORMAL HIGH (ref 70–99)
Glucose-Capillary: 156 mg/dL — ABNORMAL HIGH (ref 70–99)

## 2014-11-14 LAB — TRIGLYCERIDES: Triglycerides: 123 mg/dL (ref <150)

## 2014-11-14 LAB — MAGNESIUM: Magnesium: 2.2 mg/dL (ref 1.5–2.5)

## 2014-11-14 LAB — PHOSPHORUS: PHOSPHORUS: 3.7 mg/dL (ref 2.3–4.6)

## 2014-11-14 MED ORDER — ENOXAPARIN SODIUM 40 MG/0.4ML ~~LOC~~ SOLN
40.0000 mg | Freq: Every day | SUBCUTANEOUS | Status: DC
  Administered 2014-11-15 – 2014-11-21 (×7): 40 mg via SUBCUTANEOUS
  Filled 2014-11-14 (×7): qty 0.4

## 2014-11-14 MED ORDER — DEXMEDETOMIDINE HCL IN NACL 400 MCG/100ML IV SOLN
0.0000 ug/kg/h | INTRAVENOUS | Status: DC
  Administered 2014-11-14: 0.8 ug/kg/h via INTRAVENOUS
  Administered 2014-11-14: 1 ug/kg/h via INTRAVENOUS
  Administered 2014-11-14: 0.7 ug/kg/h via INTRAVENOUS
  Administered 2014-11-14: 1.2 ug/kg/h via INTRAVENOUS
  Administered 2014-11-14: 0.6 ug/kg/h via INTRAVENOUS
  Administered 2014-11-14: 0.8 ug/kg/h via INTRAVENOUS
  Administered 2014-11-14: 1 ug/kg/h via INTRAVENOUS
  Administered 2014-11-14: 0.8 ug/kg/h via INTRAVENOUS
  Administered 2014-11-15: 0.6 ug/kg/h via INTRAVENOUS
  Filled 2014-11-14: qty 50
  Filled 2014-11-14 (×6): qty 100
  Filled 2014-11-14: qty 50

## 2014-11-14 MED ORDER — MIDAZOLAM HCL 2 MG/2ML IJ SOLN
2.0000 mg | INTRAMUSCULAR | Status: DC | PRN
  Administered 2014-11-14: 2 mg via INTRAVENOUS
  Filled 2014-11-14: qty 2

## 2014-11-14 MED ORDER — FENTANYL CITRATE 0.05 MG/ML IJ SOLN
50.0000 ug | INTRAMUSCULAR | Status: DC | PRN
  Administered 2014-11-14 (×3): 100 ug via INTRAVENOUS
  Administered 2014-11-15 – 2014-11-16 (×3): 50 ug via INTRAVENOUS
  Administered 2014-11-17 (×2): 100 ug via INTRAVENOUS
  Filled 2014-11-14 (×9): qty 2

## 2014-11-14 MED ORDER — MIDAZOLAM HCL 2 MG/2ML IJ SOLN
2.0000 mg | INTRAMUSCULAR | Status: AC | PRN
  Administered 2014-11-14 (×3): 2 mg via INTRAVENOUS
  Filled 2014-11-14 (×3): qty 2

## 2014-11-14 NOTE — Evaluation (Signed)
Physical Therapy Evaluation Patient Details Name: William [name] MRN: [id number] DOB: 1951/01/14 Today's Date: 11/14/2014   History of Present Illness  14 M with HTN, nephrolithiasis who presented to [facility] on 1/10 with abdominal pain secondary to a perforated sigmoid. Taken emergently to OR 1/10 for partial colectomy, VDRF, currently with  tracheostomy and on Tcollar trials  Clinical Impression  Pt admitted with above diagnosis. Pt currently with functional limitations due to the deficits listed below (see PT Problem List). Pt will benefit from skilled PT to increase their independence and safety with mobility to allow discharge to the venue listed below.  Pt  With limited participation today d/t lethargy and difficulty following commands at times; will plan to incr frequency when pt able to participate more/as he is medically stable     Follow Up Recommendations SNF    Equipment Recommendations  None recommended by PT    Recommendations for Other Services       Precautions / Restrictions Precautions Precautions: Fall      Mobility  Bed Mobility Overal bed mobility: +2 for physical assistance;Needs Assistance Bed Mobility: Rolling Rolling: +2 for physical assistance;Total assist         General bed mobility comments: +2 total assist for rolling right/left  Transfers                    Ambulation/Gait                Stairs            Wheelchair Mobility    Modified Rankin (Stroke Patients Only)       Balance                                             Pertinent Vitals/Pain Pain Assessment: Faces Faces Pain Scale: Hurts a little bit Pain Location: pt unable to report, trach collar, mildy sedated, following commands Pain Intervention(s): Monitored during session    Home Living Family/patient expects to be discharged to:: Private residence Living Arrangements: Spouse/significant other Available Help at Discharge:  Family;Available 24 hours/day Type of Home: House Home Access: Stairs to enter Entrance Stairs-Rails: Right;Left;Can reach both Entrance Stairs-Number of Steps: 3 Home Layout: One level Home Equipment: Bedside commode;Walker - standard;Cane - single point      Prior Function Level of Independence: Independent               Hand Dominance        Extremity/Trunk Assessment   Upper Extremity Assessment: Overall WFL for tasks assessed           Lower Extremity Assessment: Generalized weakness (at least /5 bil, some difficutly following commands)         Communication   Communication: No difficulties  Cognition Arousal/Alertness: Lethargic;Suspect due to medications Behavior During Therapy: Restless Overall Cognitive Status: Difficult to assess Area of Impairment: Following commands;Awareness;Safety/judgement       Following Commands: Follows one step commands inconsistently Safety/Judgement: Decreased awareness of safety;Decreased awareness of deficits     General Comments: difficult  to assess, pt fairly alert but keeps eys closed most of time; mouths "I love you " to wife    General Comments      Exercises        Assessment/Plan    PT Assessment Patient needs continued PT services  PT Diagnosis Generalized weakness  PT Problem List Decreased strength;Decreased activity tolerance;Decreased mobility  PT Treatment Interventions Functional mobility training;Therapeutic activities;Therapeutic exercise;Patient/family education;Gait training;DME instruction   PT Goals (Current goals can be found in the Care Plan section) Acute Rehab PT Goals Patient Stated Goal: pt unable to state, wife wants pt to return to PLOF PT Goal Formulation: Patient unable to participate in goal setting Time For Goal Achievement: 11/28/14 Potential to Achieve Goals: Good    Frequency Min 2X/week   Barriers to discharge        Co-evaluation               End of  Session   Activity Tolerance: Patient limited by lethargy Patient left: in bed;with call bell/phone within reach;with family/visitor present;with nursing/sitter in room           Time: [id number] PT Time Calculation (min) (ACUTE ONLY): 17 min   Charges:   PT Evaluation $Initial PT Evaluation Tier I: 1 Procedure     PT G CodesDrucilla Chalet:        Demetris Capell 11/14/2014, 12:40 PM

## 2014-11-14 NOTE — Progress Notes (Signed)
eLink Physician-Brief Progress Note Patient Name: William [name] DOB: 06/22/1951 MRN: [id number]   Date of Service  11/14/2014  HPI/Events of Note  Agitated, pulling at trach.  No improvement with fentanyl/haldol.  Ativan d/c'ed on 11/13/14.  eICU Interventions  Will try on precedex gtt.     Intervention Category Major Interventions: Other:  [place] Current 11/14/2014, 12:30 AM

## 2014-11-14 NOTE — Progress Notes (Signed)
OT Cancellation Note  Patient Details Name: William [name] MRN: [id number] DOB: 01/28/1951   Cancelled Treatment:    Reason Eval/Treat Not Completed: Other (comment) Pt did work with PT this day and needed significant A. Will work with pt tomorrow for OT eval  Alba CoryREDDING, Anglea Gordner D 11/14/2014, 1:38 PM

## 2014-11-14 NOTE — Progress Notes (Signed)
PULMONARY / CRITICAL CARE MEDICINE   Name: William [name] MRN: [id number] DOB: 1950-12-27    ADMISSION DATE:  10/27/2014  CHIEF COMPLAINT:  Perforated sigmoid colon  BRIEF PATIENT DESCRIPTION:   64 y/o male former smoker presented with abd pain from perforated sigmoid colon taken to OR emergently.  Remained on vent post op.  Failed extubation 1/16, reintubated. Course complicated by vent dependence requiring trach , GNR bacteremia    SIGNIFICANT EVENTS:  1/10  Admit, Partial colectomy  1/13  Off pressors 1/14  On TNA 1/16  Extubated >> reintubated in PM 1/19  Agitation, hypertension overnight, sedation changed to propofol. 60% / 8 1/20 PEEP weaned to 5, FiO2 40%, diuresing 1/22 more hypoxemic, febrile, plan tracheostomy 1/24 rash- benadryl - zosyn stopped 1/27 agitation >> precedex  STUDIES: 1/10  CT abd/pelvis >> sigmoid colon diverticulitis with perforation, umbilical hernia, b/l renal stones, 8 mm RLL nodule 1/11  ECHO >> EF 55-60%, nml systolic fxn, PA peak 32   1/17  CT Head >> no acute intracranial abnormality, ? Small vessel ischemic changes, L maxillary sinus disease 1/25 CT abd >> collapse/consolidation in both lower lobes, Possible small abscesses in the left paracolic gutter with residual phlegmon in the anterior left anatomic pelvis, at the site of previously seen diverticular abscess.  SUBJECTIVE:   defervesced Weaned x 5h on ATC , then became hypoxic Tolerating TFs Started on precedex for agitation overnight, RASS 0 now   VITAL SIGNS: Temp:  [98 F (36.7 C)-99.8 F (37.7 C)] 98.5 F (36.9 C) (01/28 0800) Pulse Rate:  [83-95] 83 (01/28 0427) Resp:  [16-35] 28 (01/28 0928) BP: (112-158)/(39-107) 143/79 mmHg (01/28 0928) SpO2:  [85 %-96 %] 92 % (01/28 0928) FiO2 (%):  [50 %-60 %] 50 % (01/28 0928) Weight:  [122 kg (268 lb 15.4 oz)] 122 kg (268 lb 15.4 oz) (01/28 0348)   VENTILATOR SETTINGS: Vent Mode:  [-] PRVC FiO2 (%):  [50 %-60 %] 50 % Set Rate:  [18  bmp] 18 bmp Vt Set:  [610 mL] 610 mL PEEP:  [5 cmH20] 5 cmH20 Pressure Support:  [5 cmH20] 5 cmH20 Plateau Pressure:  [13 cmH20-18 cmH20] 14 cmH20   INTAKE / OUTPUT: Intake/Output      01/27 0701 - 01/28 0700 01/28 0701 - 01/29 0700   I.V. (mL/kg) 391.9 (3.2) 130.4 (1.1)   Other     NG/GT 1725 225   IV Piggyback 350 60   Total Intake(mL/kg) 2466.9 (20.2) 415.4 (3.4)   Urine (mL/kg/hr) 2825 (1) 140 (0.4)   Emesis/NG output 0 (0)    Drains     Stool 275 (0.1)    Total Output 3100 140   Net -633.1 +275.4          PHYSICAL EXAMINATION:  Gen: chr ill, calm on precedex gtt HEENT: NCAT EOMi, trach PULM:  rhonchi bases CV: tachy, no mgr AB: infrequent bowel sounds, midline scar, wound vac Derm: macular erythematous rash over trunk & BL thighs -less erythema Ext: SCD Neuro: awake, RASS 0, follows commands  LABS:  CBC  Recent Labs Lab 11/11/14 0450 11/12/14 0550 11/13/14 0620  WBC 7.7 8.5 9.4  HGB 11.3* 11.3* 11.8*  HCT 35.9* 35.8* 35.3*  PLT 467* 431* 375   BMET  Recent Labs Lab 11/12/14 0550 11/13/14 0620 11/14/14 0422  NA 139 141 144  K 3.9 3.6 3.6  CL 105 107 107  CO2 BUN 28* 24* 31*  CREATININE 0.98 0.81 0.86  GLUCOSE  111* 151* 136*   Electrolytes  Recent Labs Lab 11/11/14 0450 11/12/14 0550 11/13/14 0620 11/14/14 0422  CALCIUM 8.2* 8.4 8.6 9.1  MG 2.0  --   --  2.2  PHOS 3.6  --   --  3.7   Sepsis Markers No results for input(s): LATICACIDVEN, PROCALCITON, O2SATVEN in the last 168 hours. ABG No results for input(s): PHART, PCO2ART, PO2ART in the last 168 hours. Liver Enzymes  Recent Labs Lab 11/11/14 0450 11/14/14 0422  AST 34 26  ALT 43 38  ALKPHOS 96 78  BILITOT 0.4 0.5  ALBUMIN 2.6* 2.8*   Glucose  Recent Labs Lab 11/13/14 1212 11/13/14 1639 11/13/14 2030 11/13/14 2320 11/14/14 0341 11/14/14 0747  GLUCAP 135* 141* 102* 149* 142* 146*    Imaging No results found.  ASSESSMENT / PLAN:  PULMONARY ETT  1/10 >> 1/16, 1/16 >> 1/22 Trach (Dr Jenne PaneBates) 1/22 >>  A: Acute respiratory failure with hypoxemia, worse 1/21> suspect HCAP, +/- pulm edema (net even hospitalization) Prolonged Vent course> tracheostomy 1/22 Hx of tobacco abuse with 8 mm RLL nodule on CT abd/pelvis - will need outpt FU Hx of OSA P:   SBTs with goal ATC daytime Continue guaifenesin, pulm toilette  CARDIOVASCULAR L femoral CVC 1/11 >> 1/18 RUE PICC 1/13 >> 1/18 (coiled) LUE PICC 1/18 >>  A: Septic shock 2nd to peritonitis >> resolved Demand ischemia > resolved HTN P:   Hold outpatient lisinopril Continue metoprolol 25mg  bid as BP permits   RENAL A: Lactic Acidosis  - resolved P:   Monitor renal fx, urine outpt, electrolytes Resume lasix 40 daily  GASTROINTESTINAL A: Perforated Sigmoid s/p Partial Colectomy (1/10) Protein calorie malnutrition P:   Post-op care, nutrition per [facility] Protonix for SUP TNA / Lipids stopped 1/24  HEMATOLOGIC A: Anemia of critical illness P: Intermittent CBC, transfuse if Hgb < 7gm/dL SQ heparin for DVT prevention  INFECTIOUS A: Septic shock 2nd to peritonitis from perforated sigmoid colon, B frag.bacteremia Left Maxillary Sinus Disease  MSSA HCAP 1/22 Drug rash -resolved P:   7 days zosyn, micafungin > d/c'd 1/18 Vanco 1/22 >> 1/24, 1/25 >>1/26 Zosyn 1/22 >> 1/24 (rash) cipro 1/24 >> 1/26 levaquin 1/26 >> Fluconazole  1/23 >> 1/25 (rash) for thrush and skin yeast Flagyl 1/26 >>   Blood Cx 1/11 >> neg Blood Cx 1/22 >> B frag  Resp Cx 1/22 >>MSSA   Appreciate ID input -simplified to levaquin/ flagyl Fever resolved  ENDOCRINE A: Hyperglycemia Hx of hypothyroidism P:   SSI  Ct synthroid per tube  NEUROLOGIC A: Post-op pain control Agitation - CT head 1/17 neg for acute abnormality P: RASS Goal: 0 Supportive care:  precedex ok, Minimize sedation as able, promote sleep wake cycle, mobilize etc  Fentanyl int, haldol prn  Family: Wife updated  bedside 1/28  Today's Summary : Wean to ATC, GNR bacteremia complicated by rash  - now improving Stable to transfer to LTAC if no more surgical issues OK for PT to mobilise on vent  Care during the described time interval was provided by me and/or other providers on the critical care team.  I have reviewed this patient's available data, including medical history, events of note, physical examination and test results as part of my evaluation  CC time x 462m  ALVA,RAKESH V. MD  11/14/2014, 10:03 AM

## 2014-11-14 NOTE — Progress Notes (Signed)
CARE MANAGEMENT NOTE 11/14/2014  Patient:  William [name],William [name]   Account Number:  [id number]  Date Initiated:  10/29/2014  Documentation initiated by:  Ferdinand CavaSCHETTINO,ANDREA  Subjective/Objective Assessment:   64 yo male admitted with diveticulitis of colon with perforation     Action/Plan:   discharge planning   Anticipated DC Date:  11/17/2014   Anticipated DC Plan:  SKILLED NURSING FACILITY  In-house referral  Clinical Social Worker      DC Planning Services  CM consult      Choice offered to / List presented to:             Status of service:  In process, will continue to follow Medicare Important Message given?   (If response is "NO", the following Medicare IM given date fields will be blank) Date Medicare IM given:   Medicare IM given by:   Date Additional Medicare IM given:   Additional Medicare IM given by:    Discharge Disposition:    Per UR Regulation:  Reviewed for med. necessity/level of care/duration of stay  If discussed at Long Length of Stay Meetings, dates discussed:   10/31/2014  11/04/2014  11/07/2014  11/11/2014  11/14/2014    Comments:  Jan. 28, 16/Rhonda L. Earlene Plateravis, RN, BSN, CCM/Case Management [employer] [phone] No discharge needs present of time of review. pt does not meet ltach glines for the insurance policy-uhc/have suggested to md to consider snf placement.  [id number]/Rhonda Davis,RN,BSN,CCM: Pt remains on vent /failed attempts to wean/tracheostomy planned for next few days.  10/29/14 Ferdinand CavaAndrea Schettino RN BSN CM [phone] CM will continue to follow for any discharge planning needs

## 2014-11-14 NOTE — Progress Notes (Signed)
Patient ID: William [name], male   DOB: 12/31/1950, 64 y.o.   MRN: [id number] 6 Days Post-Op  Subjective: Pt awake laying in bed in no distress. Wound vac removed yesterday. Inferior portion of wound opened some more. He spent 6 hours on trach collar.   Objective: Vital signs in last 24 hours: Temp:  [98 F (36.7 C)-99.8 F (37.7 C)] 98.5 F (36.9 C) (01/28 0800) Pulse Rate:  [83-95] 83 (01/28 0427) Resp:  [16-35] 28 (01/28 0928) BP: (112-158)/(39-107) 143/79 mmHg (01/28 0928) SpO2:  [85 %-96 %] 92 % (01/28 0928) FiO2 (%):  [50 %-60 %] 50 % (01/28 0928) Weight:  [268 lb 15.4 oz (122 kg)] 268 lb 15.4 oz (122 kg) (01/28 0348) Last BM Date: 11/13/14  . sodium chloride 10 mL/hr at 11/14/14 0900  . dexmedetomidine 0.8 mcg/kg/hr (11/14/14 0909)  . feeding supplement (VITAL HIGH PROTEIN) 1,000 mL (11/14/14 0900)   Intake/Output from previous day: 01/27 0701 - 01/28 0700 In: 2466.9 [I.V.:391.9; NG/GT:1725; IV Piggyback:350] Out: 3100 [Urine:2825; Stool:275] Intake/Output this shift: Total I/O In: 415.4 [I.V.:130.4; NG/GT:225; IV Piggyback:60] Out: 140 [Urine:140]  General appearance: sleepy, no distress Resp: breathing comfortably GI: Stool coming from the ostomy.  Abd soft, nontender, non distended.     Lab Results:   Recent Labs  11/12/14 0550 11/13/14 0620  WBC 8.5 9.4  HGB 11.3* 11.8*  HCT 35.8* 35.3*  PLT 431* 375    BMET  Recent Labs  11/13/14 0620 11/14/14 0422  NA 141 144  K 3.6 3.6  CL 107 107  CO2 27 27  GLUCOSE 151* 136*  BUN 24* 31*  CREATININE 0.81 0.86  CALCIUM 8.6 9.1   PT/INR No results for input(s): LABPROT, INR in the last 72 hours.   Recent Labs Lab 11/11/14 0450 11/14/14 0422  AST 34 26  ALT 43 38  ALKPHOS 96 78  BILITOT 0.4 0.5  PROT 6.1 6.4  ALBUMIN 2.6* 2.8*     Lipase     Component Value Date/Time   LIPASE 23 10/27/2014 1810     Studies/Results: No results found.  Medications: . antiseptic oral rinse  7 mL Mouth  Rinse QID  . bacitracin   Topical BID  . chlorhexidine  15 mL Mouth Rinse BID  . enoxaparin (LOVENOX) injection  30 mg Subcutaneous Q24H  . fentaNYL  25 mcg Intravenous Once  . furosemide  40 mg Intravenous Daily  . guaiFENesin  10 mL Per Tube BID  . insulin aspart  0-20 Units Subcutaneous 6 times per day  . insulin glargine  15 Units Subcutaneous Daily  . levofloxacin (LEVAQUIN) IV  750 mg Intravenous Q24H  . levothyroxine  150 mcg Per Tube QAC breakfast  . lip balm  1 application Topical BID  . metoprolol tartrate  25 mg Per Tube BID  . metronidazole  500 mg Intravenous Q8H  . nystatin   Topical BID  . pantoprazole sodium  40 mg Per Tube Q1200  . sodium chloride  10-40 mL Intracatheter Q12H    Assessment/Plan 1. Sigmoid diverticulitis, Perforated Bowel EXPLORATORY LAPAROTOMY, PARTIAL COLECTOMY Sigmoid, COLOSTOMY,  Repair umbilical hernia, 10/28/2014, Chevis PrettyPaul Toth III, MD.  2. Sepsis with hypotension 3.Ventilator dependant respiratory failure/EXTUBATED/RE intubated 11/02/14  4. Hx of hypertension, now requiring pressors 5. Hypothyroid TSH 16 6. Hx of tobacco use 30 years (quit 9 years ago) 7. Body mass index is 38.51 kg  8. Hx of nephrolithiasis 9. Heparin for DVT prophylaxis 10. Malnutrition- tube feeds at goal.  Plan:    Tolerating tube feeds.  Continue.    GNR in bloodstream, still awaiting culture species results.    Working towards weaning off ventilator.    Wife at bedside.   LOS: 18 days    Acquanetta Chain 11/14/2014

## 2014-11-14 NOTE — Progress Notes (Signed)
Patient ID: William [name], male   DOB: 03/18/1951, 64 y.o.   MRN: [id number]         [facility]    Date of Admission:  10/27/2014   Total days of antibiotics 7 (second course)        Day 2 levofloxacin        Day 2 metronidazole          Principal Problem:   Fever Active Problems:   Diverticulitis of colon with perforation s/p colectomy/colostomy 10/28/2014   Acute respiratory failure with hypoxia   Rash   Abscess of suppurative peritonitis   Bacteroides fragilis infection   Solitary pulmonary nodule on lung CT   Sleep apnea   Normocytic anemia   Obesity   . antiseptic oral rinse  7 mL Mouth Rinse QID  . bacitracin   Topical BID  . chlorhexidine  15 mL Mouth Rinse BID  . [START ON 11/15/2014] enoxaparin (LOVENOX) injection  40 mg Subcutaneous Daily  . fentaNYL  25 mcg Intravenous Once  . furosemide  40 mg Intravenous Daily  . guaiFENesin  10 mL Per Tube BID  . insulin aspart  0-20 Units Subcutaneous 6 times per day  . insulin glargine  15 Units Subcutaneous Daily  . levofloxacin (LEVAQUIN) IV  750 mg Intravenous Q24H  . levothyroxine  150 mcg Per Tube QAC breakfast  . lip balm  1 application Topical BID  . metoprolol tartrate  25 mg Per Tube BID  . metronidazole  500 mg Intravenous Q8H  . nystatin   Topical BID  . pantoprazole sodium  40 mg Per Tube Q1200  . sodium chloride  10-40 mL Intracatheter Q12H    Subjective: He is much more alert today. He reaches to shake my hand and nods appropriately to questions.  Review of Systems: Pertinent items are noted in HPI.  Past Medical History  Diagnosis Date  . Renal disorder     kidney stones  . Hypertension   . Thyroid disease     History  Substance Use Topics  . Smoking status: Former Games developermoker  . Smokeless tobacco: Not on file  . Alcohol Use: Not on file    History reviewed. No pertinent family history. Allergies  Allergen Reactions  . Aloprim [Allopurinol] Other (See Comments)    Unknown reaction  . Simvastatin Other (See Comments)    Unknown reaction    OBJECTIVE: Blood pressure 135/88, pulse 83, temperature 98.4 F (36.9 C), temperature source Oral, resp. rate 20, height 5' 11.5" (1.816 m), weight 268 lb 15.4 oz (122 kg), SpO2 95 %. General: He is much more alert and comfortable Skin: His rash continues to resolve Lungs: Few rhonchi. Strong cough. He is tolerated trach collar oxygen throughout the day today Cor: Distant but regular S1 and S2 with no murmurs heard Abdomen: Abdomen distended but soft and nontender   Lab Results Lab Results  Component Value Date   WBC 9.4 11/13/2014   HGB 11.8* 11/13/2014   HCT 35.3* 11/13/2014   MCV 96.2 11/13/2014   PLT 375 11/13/2014    Lab Results  Component Value Date   CREATININE 0.86 11/14/2014   BUN 31* 11/14/2014   NA 144 11/14/2014   K 3.6 11/14/2014   CL 107 11/14/2014   CO2 27 11/14/2014    Lab Results  Component Value Date   ALT 38 11/14/2014   AST 26 11/14/2014   ALKPHOS 78 11/14/2014   BILITOT 0.5 11/14/2014  Microbiology: Recent Results (from the past 240 hour(s))  Culture, respiratory (NON-Expectorated)     Status: None   Collection Time: 11/08/14  9:45 AM  Result Value Ref Range Status   Specimen Description TRACHEAL ASPIRATE  Final   Special Requests Normal  Final   Gram Stain   Final    MODERATE WBC PRESENT, PREDOMINANTLY PMN RARE SQUAMOUS EPITHELIAL CELLS PRESENT FEW GRAM POSITIVE COCCI IN PAIRS Performed at [facility]    Culture   Final    MODERATE STAPHYLOCOCCUS AUREUS Note: RIFAMPIN AND GENTAMICIN SHOULD NOT BE USED AS SINGLE DRUGS FOR TREATMENT OF STAPH INFECTIONS. Performed at [facility]    Report Status 11/11/2014 FINAL  Final   Organism ID, Bacteria STAPHYLOCOCCUS AUREUS  Final      Susceptibility   Staphylococcus aureus - MIC*    CLINDAMYCIN <=0.25 SENSITIVE Sensitive     ERYTHROMYCIN 0.5 SENSITIVE Sensitive     GENTAMICIN <=0.5 SENSITIVE  Sensitive     LEVOFLOXACIN 0.25 SENSITIVE Sensitive     OXACILLIN <=0.25 SENSITIVE Sensitive     PENICILLIN 0.12 SENSITIVE Sensitive     RIFAMPIN <=0.5 SENSITIVE Sensitive     TRIMETH/SULFA <=10 SENSITIVE Sensitive     VANCOMYCIN 1 SENSITIVE Sensitive     TETRACYCLINE <=1 SENSITIVE Sensitive     MOXIFLOXACIN <=0.25 SENSITIVE Sensitive     * MODERATE STAPHYLOCOCCUS AUREUS  Culture, blood (routine x 2)     Status: None   Collection Time: 11/08/14 10:48 AM  Result Value Ref Range Status   Specimen Description BLOOD RIGHT HAND  Final   Special Requests   Final    BOTTLES DRAWN AEROBIC AND ANAEROBIC AER ANA   Culture   Final    BACTEROIDES FRAGILIS Note: BETA LACTAMASE POSITIVE Note: Gram Stain Report Called to,Read Back By and Verified With: Marita Kansas RN on 11/10/14 at 05:05 by Christie Nottingham Performed at [facility]    Report Status 11/12/2014 FINAL  Final  Culture, blood (routine x 2)     Status: None   Collection Time: 11/08/14 10:55 AM  Result Value Ref Range Status   Specimen Description BLOOD RIGHT HAND  Final   Special Requests BOTTLES DRAWN AEROBIC AND ANAEROBIC  Final   Culture   Final    BACTEROIDES FRAGILIS Note: BETA LACTAMASE POSITIVE Note: Gram Stain Report Called to,Read Back By and Verified With: Marita Kansas RN @ [phone] [facility] 11/09/14 Performed at [facility]    Report Status 11/12/2014 FINAL  Final  Culture, Urine     Status: None   Collection Time: 11/11/14 10:55 AM  Result Value Ref Range Status   Specimen Description URINE, CATHETERIZED  Final   Special Requests NONE  Final   Colony Count NO GROWTH Performed at [facility]   Final   Culture NO GROWTH Performed at [facility]   Final   Report Status 11/12/2014 FINAL  Final    Assessment: He is improving on therapy for peritonitis due to perforated sigmoid colon and healthcare associated pneumonia. His recent allergic drug rash is resolving.  Plan: 1. Continue  levofloxacin and metronidazole  Cliffton Asters, MD [facility] [phone] pager   [phone] cell 11/14/2014, 5:08 PM

## 2014-11-15 DIAGNOSIS — Z430 Encounter for attention to tracheostomy: Secondary | ICD-10-CM

## 2014-11-15 LAB — BASIC METABOLIC PANEL
ANION GAP: 9 (ref 5–15)
BUN: 33 mg/dL — ABNORMAL HIGH (ref 6–23)
CALCIUM: 8.9 mg/dL (ref 8.4–10.5)
CO2: 26 mmol/L (ref 19–32)
CREATININE: 0.82 mg/dL (ref 0.50–1.35)
Chloride: 110 mmol/L (ref 96–112)
GFR calc Af Amer: >90 mL/min (ref >90)
Glucose, Bld: 152 mg/dL — ABNORMAL HIGH (ref 70–99)
Potassium: 3.4 mmol/L — ABNORMAL LOW (ref 3.5–5.1)
SODIUM: 145 mmol/L (ref 135–145)

## 2014-11-15 LAB — CBC
HEMATOCRIT: 37.1 % — AB (ref 39.0–52.0)
Hemoglobin: 12.4 g/dL — ABNORMAL LOW (ref 13.0–17.0)
MCH: 31.6 pg (ref 26.0–34.0)
MCHC: 33.4 g/dL (ref 30.0–36.0)
MCV: 94.6 fL (ref 78.0–100.0)
PLATELETS: 450 10*3/uL — AB (ref 150–400)
RBC: 3.92 MIL/uL — ABNORMAL LOW (ref 4.22–5.81)
RDW: 14.2 % (ref 11.5–15.5)
WBC: 12.7 10*3/uL — AB (ref 4.0–10.5)

## 2014-11-15 LAB — GLUCOSE, CAPILLARY
GLUCOSE-CAPILLARY: 102 mg/dL — AB (ref 70–99)
Glucose-Capillary: 119 mg/dL — ABNORMAL HIGH (ref 70–99)
Glucose-Capillary: 122 mg/dL — ABNORMAL HIGH (ref 70–99)
Glucose-Capillary: 123 mg/dL — ABNORMAL HIGH (ref 70–99)
Glucose-Capillary: 132 mg/dL — ABNORMAL HIGH (ref 70–99)
Glucose-Capillary: 136 mg/dL — ABNORMAL HIGH (ref 70–99)
Glucose-Capillary: 154 mg/dL — ABNORMAL HIGH (ref 70–99)

## 2014-11-15 MED ORDER — POTASSIUM CHLORIDE 20 MEQ/15ML (10%) PO SOLN
20.0000 meq | ORAL | Status: AC
  Administered 2014-11-15 (×2): 20 meq
  Filled 2014-11-15 (×2): qty 15

## 2014-11-15 NOTE — Evaluation (Signed)
[facility]y-[facility] Speaking Valve - Evaluation Patient Details  Name: William [name] MRN: [id number] Date of Birth: 06/06/1951  Today's Date: 11/15/2014 Time: 1243-1320 SLP Time Calculation (min) (ACUTE ONLY): 37 min  Past Medical History:  Past Medical History  Diagnosis Date  . Renal disorder     kidney stones  . Hypertension   . Thyroid disease    Past Surgical History:  Past Surgical History  Procedure Laterality Date  . Partial colectomy N/A 10/28/2014    Procedure: PARTIAL COLECTOMY Sigmoid;  Surgeon: Chevis PrettyPaul Toth III, MD;  Location: [facility];  Service: General;  Laterality: N/A;  . Colostomy N/A 10/28/2014    Procedure: COLOSTOMY;  Surgeon: Chevis PrettyPaul Toth III, MD;  Location: [facility];  Service: General;  Laterality: N/A;  . Laparotomy N/A 10/28/2014    Procedure: EXPLORATORY LAPAROTOMY;  Surgeon: Chevis PrettyPaul Toth III, MD;  Location: [facility];  Service: General;  Laterality: N/A;  . Tracheostomy tube placement N/A 11/08/2014    Procedure: TRACHEOSTOMY;  Surgeon: Christia Readingwight Bates, MD;  Location: [facility];  Service: ENT;  Laterality: N/A;   HPI:  3 M with HTN, nephrolithiasis who [facility] with abdominal pain secondary to a perforated sigmoid. Taken emergently to OR 1/10 for partial colectomy, VDRF, currently with  tracheostomy and on Tcollar trials    Assessment / Plan / Recommendation Clinical Impression  Pt with cuff deflated upon SLP entrance to room, wet congested breathing quality noted concerning for secretions.  Kathlene NovemberMike RN suctioned pt per SLP request - copious viscous secretions removed.  Pt with great tolerance of PMSV for entire 35 minute trial without breath stacking/CO2 retention.  His loudness is decreased but with minimal verbal/visual cues pt demonstrates improvement.   SLP educated spouse/pt to indication for PMSV, contraindications and appropriate usage.  Pt's spouse demonstrated placement/removal and using teach back education was reinforced.  Will follow briefly for training  phonation/respiration to maximize loudness and for PMSV treatment.  Thanks for this consult.     SLP Assessment  Patient needs continued Speech Lanaguage Pathology Services    Follow Up Recommendations  None    Frequency and Duration min 2x/week  1 week   Pertinent Vitals/Pain Afebrile, decreased    SLP Goals Potential to Achieve Goals (ACUTE ONLY): Good   PMSV Trial  PMSV was placed for: phonatory abilities Able to redirect subglottic air through upper airway: Yes Able to Attain Phonation: Yes Voice Quality: Low vocal intensity;Normal (near normal per spouse/pt) Able to Expectorate Secretions:  (without valve via trach) Level of Secretion Expectoration with PMSV: Not observed Breath Support for Phonation: Moderately decreased Intelligibility: Intelligible Respirations During Trial: 18 SpO2 During Trial: 91 % Pulse During Trial: 93 Behavior: Alert;Expresses self well;Good eye contact;Responsive to questions   Tracheostomy Tube    6 Shiley cuffed   Vent Dependency  FiO2 (%): 28 %    Cuff Deflation Trial Tolerated Cuff Deflation: Yes Length of Time for Cuff Deflation Trial: deflated by RN prior to SLP visit Behavior: Alert;Cooperative;Expresses self well;Good eye contact   Donavan Burnetamara Kyheem Bathgate, MS [facility] SLP [phone]

## 2014-11-15 NOTE — Evaluation (Signed)
Occupational Therapy Evaluation Patient Details Name: William OuJoseph Shake MRN: [id number] DOB: 12/21/1950 Today's Date: 11/15/2014    History of Present Illness [age] M with HTN, nephrolithiasis [facility] with abdominal pain secondary to a perforated sigmoid. Taken emergently to OR 1/10 for partial colectomy, VDRF, currently with  tracheostomy and on Tcollar trials   Clinical Impression   Pt admitted with nephrolithiasis. Pt currently with functional limitations due to the deficits listed below (see OT Problem List).  Pt will benefit from skilled OT to increase their safety and independence with ADL and functional mobility for ADL to facilitate discharge to venue listed below.      Follow Up Recommendations  SNF;Home health OT;Supervision/Assistance - 24 hour (depending on progress)    Equipment Recommendations  Other (comment) (TBD)    Recommendations for Other Services       Precautions / Restrictions Precautions Precautions: Fall Restrictions Weight Bearing Restrictions: No      Mobility Bed Mobility               General bed mobility comments: pt sitting in chair  Transfers Overall transfer level: Needs assistance Equipment used: Rolling walker (2 wheeled) Transfers: Sit to/from Stand Sit to Stand: +2 physical assistance;+2 safety/equipment;Mod assist         General transfer comment: pt sat VERY QUICKLY in recliner without notice.  Pt mouthed that knees gave out. Nursing aware    Balance                                            ADL Overall ADL's : Needs assistance/impaired     Grooming: Sitting;Minimal assistance               Lower Body Dressing: Sit to/from stand;Moderate assistance;+2 for physical assistance;+2 for safety/equipment;Cueing for sequencing;Cueing for safety                       Vision                     Perception     Praxis      Pertinent Vitals/Pain Pain Assessment:  No/denies pain     Hand Dominance     Extremity/Trunk Assessment Upper Extremity Assessment Upper Extremity Assessment: Generalized weakness           Communication Communication Communication: No difficulties   Cognition   Behavior During Therapy: WFL for tasks assessed/performed Overall Cognitive Status: Within Functional Limits for tasks assessed Area of Impairment: Safety/judgement;Awareness;Problem solving       Following Commands: Follows one step commands inconsistently Safety/Judgement: Decreased awareness of safety;Decreased awareness of deficits         General Comments       Exercises       Shoulder Instructions      Home Living Family/patient expects to be discharged to:: Private residence Living Arrangements: Spouse/significant other Available Help at Discharge: Family;Available 24 hours/day Type of Home: House Home Access: Stairs to enter Entergy CorporationEntrance Stairs-Number of Steps: 3 Entrance Stairs-Rails: Right;Left;Can reach both Home Layout: One level               Home Equipment: Bedside commode;Walker - standard;Cane - single point          Prior Functioning/Environment Level of Independence: Independent  OT Diagnosis: Generalized weakness   OT Problem List: Decreased strength;Impaired balance (sitting and/or standing);Decreased safety awareness;Decreased knowledge of precautions;Obesity   OT Treatment/Interventions: Self-care/ADL training;DME and/or AE instruction;Patient/family education    OT Goals(Current goals can be found in the care plan section) Acute Rehab OT Goals Patient Stated Goal: pt unable to state, wife wants pt to return to PLOF OT Goal Formulation: With family Time For Goal Achievement: 11/22/14 Potential to Achieve Goals: Good  OT Frequency: Min 2X/week   Barriers to D/C:            Co-evaluation              End of Session Equipment Utilized During Treatment: Engineer, water  Communication: Mobility status  Activity Tolerance: Patient tolerated treatment well Patient left: in chair;with family/visitor present;with call bell/phone within reach;with nursing/sitter in room   Time: 1025-1050 OT Time Calculation (min): 25 min Charges:  OT General Charges $OT Visit: 1 Procedure OT Evaluation $Initial OT Evaluation Tier I: 1 Procedure OT Treatments $Self Care/Home Management : 8-22 mins G-Codes:    Einar Crow D 2014/12/14, 11:28 AM

## 2014-11-15 NOTE — Progress Notes (Signed)
NUTRITION FOLLOW UP  Intervention:   - Recommend changing TF formula to Vital AF 1.2 @ 80 mL/hr to better meet pt's needs.  - Prostat once daily   TF regimen with Prostat to provide 2404 kcal (100% of estimated needs), 159 g protein (99% of estimated needs) and 1555 mL of fluid.   -Diet advancement per MD -RD to continue to monitor  Nutrition Dx:   Inadequate oral intake related to inability to eat as evidenced by NPO; ongoing  Goal:   Pt to meet >/= 90% of their estimated nutrition needs   Monitor:   TF tolerance, GI profile, weight trend, diet advancement, labs  Assessment:   64 y/o male former smoker presented with abd pain from perforated sigmoid colon taken to OR emergently. Remained on vent post op. Failed extubation 1/16, reintubated.   1/10 Admit, Partial colectomy 1/16 extubated, reintubated in pm 1/22 tracheostomy 1/24 TPN stopped 1/28 Pt off vent  - Pt currently tolerating TF Vital HP @ 75 mL/hr to provide 1800 kcal (78% of estimated needs) and 158 g protein (99% of estimated needs).  - Pt no longer on vent and does not need to be underfed. Recommend changing TF to formula that meets 100% of calorie and protein needs.  - Pt's weight down 12 lbs since admission. - Surgery paged for permission to change TF- no answer.    Height: Ht Readings from Last 1 Encounters:  10/28/14 5' 11.5" (1.816 m)    Weight Status:   Wt Readings from Last 1 Encounters:  11/14/14 268 lb 15.4 oz (122 kg)  11/06/14 275 lb 10/30/14 293 lb 10/27/14 280 lb  Re-estimated needs(1/21):  Kcal: 2300-2500 Protein:~160 gram protein Fluid: per MD  Skin:  Surgical incision on abd, +1 generalized edema, +1 perineal edema, +1 RLE, +1 LLE edema  Diet Order: Diet NPO time specified   Intake/Output Summary (Last 24 hours) at 11/15/14 0930 Last data filed at 11/15/14 0900  Gross per 24 hour  Intake 1690.27 ml  Output   3310 ml  Net -1619.73 ml    Last BM: 1/29- 200 mL output from  colostomy  Labs:   Recent Labs Lab 11/11/14 0450  11/13/14 0620 11/14/14 0422 11/15/14 0413  NA 143  < > 141 144 145  K 3.3*  < > 3.6 3.6 3.4*  CL 105  < > 107 107 110  CO2 27  < > BUN 30*  < > 24* 31* 33*  CREATININE 1.04  < > 0.81 0.86 0.82  CALCIUM 8.2*  < > 8.6 9.1 8.9  MG 2.0  --   --  2.2  --   PHOS 3.6  --   --  3.7  --   GLUCOSE 129*  < > 151* 136* 152*  < > = values in this interval not displayed.  CBG (last 3)   Recent Labs  11/14/14 2332 11/15/14 0411 11/15/14 0754  GLUCAP 136* 132* 122*    Scheduled Meds: . antiseptic oral rinse  7 mL Mouth Rinse QID  . bacitracin   Topical BID  . chlorhexidine  15 mL Mouth Rinse BID  . enoxaparin (LOVENOX) injection  40 mg Subcutaneous Daily  . fentaNYL  25 mcg Intravenous Once  . furosemide  40 mg Intravenous Daily  . guaiFENesin  10 mL Per Tube BID  . insulin aspart  0-20 Units Subcutaneous 6 times per day  . insulin glargine  15 Units Subcutaneous Daily  . levofloxacin (  LEVAQUIN) IV  750 mg Intravenous Q24H  . levothyroxine  150 mcg Per Tube QAC breakfast  . lip balm  1 application Topical BID  . metoprolol tartrate  25 mg Per Tube BID  . metronidazole  500 mg Intravenous Q8H  . nystatin   Topical BID  . pantoprazole sodium  40 mg Per Tube Q1200  . potassium chloride  20 mEq Per Tube Q4H  . sodium chloride  10-40 mL Intracatheter Q12H    Continuous Infusions: . sodium chloride 10 mL/hr at 11/14/14 1400  . dexmedetomidine 0.4 mcg/kg/hr (11/15/14 0700)  . feeding supplement (VITAL HIGH PROTEIN) 1,000 mL (11/14/14 1841)    Emmaline KluverHaley Inioluwa Boulay MS, RD, LDN Pager # [phone]

## 2014-11-15 NOTE — Procedures (Signed)
Pt sitting in chair with trach collar @40 % with 10L. Hr-72, RR-17 SpO2-93 Trach assessed and site cleaned and suctioned.  Vent on standby.

## 2014-11-15 NOTE — Progress Notes (Signed)
7 Days Post-Op  Subjective: He is up in chair and off vent since 11 AM yesterday.  Tolerating TF and stool from ostomy.  Objective: Vital signs in last 24 hours: Temp:  [98.4 F (36.9 C)-99.3 F (37.4 C)] 99.3 F (37.4 C) (01/29 0412) Pulse Rate:  [67-76] 73 (01/29 0413) Resp:  [10-28] 20 (01/29 0700) BP: (119-173)/(60-103) 132/79 mmHg (01/29 0700) SpO2:  [85 %-98 %] 93 % (01/29 0738) FiO2 (%):  [40 %-60 %] 40 % (01/29 0738) Last BM Date:  (colostomy) 730 TF recorded 200 from colostomy 3250 urine output Afebrile, tolerating trach collar some K+ 3.4 WBC 12.7 He has had 3 days of levofloxacin and Flagyl Intake/Output from previous day: 01/28 0701 - 01/29 0700 In: 2041.5 [I.V.:901.5; NG/GT:730; IV Piggyback:410] Out: 3450 [Urine:3250; Stool:200] Intake/Output this shift:    General appearance: alert, cooperative and no distress Resp: clear to auscultation bilaterally and some wheezing, but seems comfortable off the vent. GI: soft + BS, stool in ostomy bag, I will look at wound later when they do dressing change. Extremities: extremities normal, atraumatic, no cyanosis or edema  Lab Results:   Recent Labs  11/13/14 0620 11/15/14 0413  WBC 9.4 12.7*  HGB 11.8* 12.4*  HCT 35.3* 37.1*  PLT 375 450*    BMET  Recent Labs  11/14/14 0422 11/15/14 0413  NA 144 145  K 3.6 3.4*  CL 107 110  CO2 27 26  GLUCOSE 136* 152*  BUN 31* 33*  CREATININE 0.86 0.82  CALCIUM 9.1 8.9   PT/INR No results for input(s): LABPROT, INR in the last 72 hours.   Recent Labs Lab 11/11/14 0450 11/14/14 0422  AST 34 26  ALT 43 38  ALKPHOS 96 78  BILITOT 0.4 0.5  PROT 6.1 6.4  ALBUMIN 2.6* 2.8*     Lipase     Component Value Date/Time   LIPASE 23 10/27/2014 1810     Studies/Results: No results found.  Medications: . antiseptic oral rinse  7 mL Mouth Rinse QID  . bacitracin   Topical BID  . chlorhexidine  15 mL Mouth Rinse BID  . enoxaparin (LOVENOX) injection  40 mg  Subcutaneous Daily  . fentaNYL  25 mcg Intravenous Once  . furosemide  40 mg Intravenous Daily  . guaiFENesin  10 mL Per Tube BID  . insulin aspart  0-20 Units Subcutaneous 6 times per day  . insulin glargine  15 Units Subcutaneous Daily  . levofloxacin (LEVAQUIN) IV  750 mg Intravenous Q24H  . levothyroxine  150 mcg Per Tube QAC breakfast  . lip balm  1 application Topical BID  . metoprolol tartrate  25 mg Per Tube BID  . metronidazole  500 mg Intravenous Q8H  . nystatin   Topical BID  . pantoprazole sodium  40 mg Per Tube Q1200  . potassium chloride  20 mEq Per Tube Q4H  . sodium chloride  10-40 mL Intracatheter Q12H    Assessment/Plan 1. Sigmoid diverticulitis, Perforated Bowel EXPLORATORY LAPAROTOMY, PARTIAL COLECTOMY Sigmoid, COLOSTOMY,  Repair umbilical hernia, 10/28/2014, [name], MD.  2. Sepsis with hypotension 3.Ventilator dependant respiratory failure/EXTUBATED/RE intubated 11/02/14, /tracheostomy1/22/16/ resolving pneumonia 4. Hx of hypertension/hypotensio after surgery, previously requiring pressors 5. Hypothyroid TSH 16 6. Hx of tobacco use 30 years (quit 9 years ago) 7. Body mass index is 38.51 kg  8. Hx of nephrolithiasis 9. Lovenox for DVT prophylaxis 10. Malnutrition- tube feeds at goal.    Plan:  He is making good progress and off  vent for almost 24 hours.  Continue current treatment and see how he progresses.    LOS: 19 days    Dolata,Berl Bonfanti 11/15/2014

## 2014-11-15 NOTE — Progress Notes (Signed)
PULMONARY / CRITICAL CARE MEDICINE   Name: William [name] MRN: [id number] DOB: 10/27/1950    ADMISSION DATE:  10/27/2014  CHIEF COMPLAINT:  Perforated sigmoid colon  BRIEF PATIENT DESCRIPTION:   64 y/o male former smoker presented with abd pain from perforated sigmoid colon taken to OR emergently.  Remained on vent post op.  Failed extubation 1/16, reintubated. Course complicated by vent dependence requiring trach , GNR bacteremia    SIGNIFICANT EVENTS:  1/10  Admit, Partial colectomy  1/13  Off pressors 1/14  On TNA 1/16  Extubated >> reintubated in PM 1/19  Agitation, hypertension overnight, sedation changed to propofol. 60% / 8 1/20 PEEP weaned to 5, FiO2 40%, diuresing 1/22 more hypoxemic, febrile, plan tracheostomy 1/24 rash- benadryl - zosyn stopped 1/27 agitation >> precedex 1/28 off vent since 11a  STUDIES: 1/10  CT abd/pelvis >> sigmoid colon diverticulitis with perforation, umbilical hernia, b/l renal stones, 8 mm RLL nodule 1/11  ECHO >> EF 55-60%, nml systolic fxn, PA peak 32   1/17  CT Head >> no acute intracranial abnormality, ? Small vessel ischemic changes, L maxillary sinus disease 1/25 CT abd >> collapse/consolidation in both lower lobes, Possible small abscesses in the left paracolic gutter with residual phlegmon in the anterior left anatomic pelvis, at the site of previously seen diverticular abscess.  SUBJECTIVE: Off vent since 11a on 1/28 oob to chair  defervesced Tolerating TFs Off precedex  Mild secretions +   VITAL SIGNS: Temp:  [98.4 F (36.9 C)-99.7 F (37.6 C)] 99.7 F (37.6 C) (01/29 0800) Pulse Rate:  [67-76] 73 (01/29 0413) Resp:  [10-26] 21 (01/29 0900) BP: (119-173)/(60-103) 126/80 mmHg (01/29 0900) SpO2:  [85 %-98 %] 91 % (01/29 0900) FiO2 (%):  [35 %-50 %] 35 % (01/29 0900)   VENTILATOR SETTINGS: Vent Mode:  [-]  FiO2 (%):  [35 %-50 %] 35 %   INTAKE / OUTPUT: Intake/Output      01/28 0701 - 01/29 0700 01/29 0701 - 01/30 0700   I.V. (mL/kg) 901.5 (7.4) 64.2 (0.5)   NG/GT 730    IV Piggyback 410    Total Intake(mL/kg) 2041.5 (16.7) 64.2 (0.5)   Urine (mL/kg/hr) 3250 (1.1)    Emesis/NG output     Stool 200 (0.1)    Total Output 3450     Net -1408.5 +64.2          PHYSICAL EXAMINATION:  Gen: chr ill, calm  HEENT: NCAT EOMi, trach PULM:  rhonchi bases CV: tachy, no mgr AB: infrequent bowel sounds, midline scar, wound vac Derm: macular erythematous rash over trunk & BL thighs -less erythema Ext: SCD Neuro: awake, RASS 0, follows commands  LABS:  CBC  Recent Labs Lab 11/12/14 0550 11/13/14 0620 11/15/14 0413  WBC 8.5 9.4 12.7*  HGB 11.3* 11.8* 12.4*  HCT 35.8* 35.3* 37.1*  PLT 431* 375 450*   BMET  Recent Labs Lab 11/13/14 0620 11/14/14 0422 11/15/14 0413  NA 141 144 145  K 3.6 3.6 3.4*  CL 107 107 110  CO2 BUN 24* 31* 33*  CREATININE 0.81 0.86 0.82  GLUCOSE 151* 136* 152*   Electrolytes  Recent Labs Lab 11/11/14 0450  11/13/14 0620 11/14/14 0422 11/15/14 0413  CALCIUM 8.2*  < > 8.6 9.1 8.9  MG 2.0  --   --  2.2  --   PHOS 3.6  --   --  3.7  --   < > = values in this interval not displayed.  Sepsis Markers No results for input(s): LATICACIDVEN, PROCALCITON, O2SATVEN in the last 168 hours. ABG No results for input(s): PHART, PCO2ART, PO2ART in the last 168 hours. Liver Enzymes  Recent Labs Lab 11/11/14 0450 11/14/14 0422  AST 34 26  ALT 43 38  ALKPHOS 96 78  BILITOT 0.4 0.5  ALBUMIN 2.6* 2.8*   Glucose  Recent Labs Lab 11/14/14 1120 11/14/14 1609 11/14/14 1935 11/14/14 2332 11/15/14 0411 11/15/14 0754  GLUCAP 156* 114* 123* 136* 132* 122*    Imaging No results found.  ASSESSMENT / PLAN:  PULMONARY ETT 1/10 >> 1/16, 1/16 >> 1/22 Trach (Dr Jenne PaneBates) 1/22 >>  A: Acute respiratory failure with hypoxemia, worse 1/21> suspect HCAP, +/- pulm edema (net even hospitalization) Prolonged Vent course> tracheostomy 1/22 Hx of tobacco abuse with 8 mm  RLL nodule on CT abd/pelvis - will need outpt FU Hx of OSA P:   Ct ATC x 24h Continue guaifenesin, pulm toilette  CARDIOVASCULAR L femoral CVC 1/11 >> 1/18 RUE PICC 1/13 >> 1/18 (coiled) LUE PICC 1/18 >>  A: Septic shock 2nd to peritonitis >> resolved Demand ischemia > resolved HTN P:   Hold outpatient lisinopril Continue metoprolol 25mg  bid as BP permits   RENAL A: Lactic Acidosis  - resolved P:   Monitor renal fx, urine outpt, electrolytes -replete as needed Resumed lasix 40 daily  GASTROINTESTINAL A: Perforated Sigmoid s/p Partial Colectomy (1/10) Protein calorie malnutrition P:   Post-op care, nutrition per [facility] Protonix for SUP TNA / Lipids stopped 1/24  HEMATOLOGIC A: Anemia of critical illness P: Intermittent CBC, transfuse if Hgb < 7gm/dL SQ heparin for DVT prevention  INFECTIOUS A: Septic shock 2nd to peritonitis from perforated sigmoid colon, B frag.bacteremia Left Maxillary Sinus Disease  MSSA HCAP 1/22 Drug rash -resolved P:   7 days zosyn, micafungin > d/c'd 1/18 Vanco 1/22 >> 1/24, 1/25 >>1/26 Zosyn 1/22 >> 1/24 (rash) cipro 1/24 >> 1/26 levaquin 1/26 >> Fluconazole  1/23 >> 1/25 (rash) for thrush and skin yeast Flagyl 1/26 >>   Blood Cx 1/11 >> neg Blood Cx 1/22 >> B frag  Resp Cx 1/22 >>MSSA   Appreciate ID input -simplified to levaquin/ flagyl - needs longer duration   ENDOCRINE A: Hyperglycemia Hx of hypothyroidism P:   SSI  Ct synthroid per tube  NEUROLOGIC A: Post-op pain control Agitation - CT head 1/17 neg for acute abnormality P: RASS Goal: 0 Supportive care:  Dc precedex , Minimize sedation as able, promote sleep wake cycle, mobilize etc  Fentanyl int, haldol prn  Family: Wife updated bedside 1/29  Today's Summary : Weaned to ATC, Unfortunately no LTAC benefits Plan for CIR vs SNF rehab OK for PT to mobilise on vent  Care during the described time interval was provided by me and/or other providers on the  critical care team.  I have reviewed this patient's available data, including medical history, events of note, physical examination and test results as part of my evaluation  CC time x 3372m  ALVA,RAKESH V. MD  11/15/2014, 9:51 AM

## 2014-11-15 NOTE — Progress Notes (Signed)
Patient ID: William [name], male   DOB: [birth year], 64 y.o.   MRN: [id number]         [facility]    Date of Admission:  10/27/2014   Total days of antibiotics 8 (second course)        Day 3 levofloxacin        Day 3 metronidazole          Principal Problem:   Fever Active Problems:   Diverticulitis of colon with perforation s/p colectomy/colostomy 10/28/2014   Acute respiratory failure with hypoxia   Rash   Abscess of suppurative peritonitis   Bacteroides fragilis infection   Solitary pulmonary nodule on lung CT   Sleep apnea   Normocytic anemia   Obesity   Tracheostomy, acute management   . antiseptic oral rinse  7 mL Mouth Rinse QID  . bacitracin   Topical BID  . chlorhexidine  15 mL Mouth Rinse BID  . enoxaparin (LOVENOX) injection  40 mg Subcutaneous Daily  . fentaNYL  25 mcg Intravenous Once  . furosemide  40 mg Intravenous Daily  . guaiFENesin  10 mL Per Tube BID  . insulin aspart  0-20 Units Subcutaneous 6 times per day  . insulin glargine  15 Units Subcutaneous Daily  . levofloxacin (LEVAQUIN) IV  750 mg Intravenous Q24H  . levothyroxine  150 mcg Per Tube QAC breakfast  . lip balm  1 application Topical BID  . metoprolol tartrate  25 mg Per Tube BID  . metronidazole  500 mg Intravenous Q8H  . nystatin   Topical BID  . pantoprazole sodium  40 mg Per Tube Q1200  . sodium chloride  10-40 mL Intracatheter Q12H    Subjective: He is much more alert today. He is sitting up in a chair.  Review of Systems: Pertinent items are noted in HPI.  Past Medical History  Diagnosis Date  . Renal disorder     kidney stones  . Hypertension   . Thyroid disease     History  Substance Use Topics  . Smoking status: Former Games developer  . Smokeless tobacco: Not on file  . Alcohol Use: Not on file    History reviewed. No pertinent family history. Allergies  Allergen Reactions  . Aloprim [Allopurinol] Other (See Comments)    Unknown reaction  .  Simvastatin Other (See Comments)    Unknown reaction    OBJECTIVE: Blood pressure 113/67, pulse 73, temperature 99.7 F (37.6 C), temperature source Oral, resp. rate 17, height 5' 11.5" (1.816 m), weight 268 lb 15.4 oz (122 kg), SpO2 94 %. General: He is much more alert and comfortable Skin: His rash has resolved Lungs: Few rhonchi. Strong cough. Cor: Distant but regular S1 and S2 with no murmurs heard Abdomen: Abdomen distended but soft and nontender   Lab Results Lab Results  Component Value Date   WBC 12.7* 11/15/2014   HGB 12.4* 11/15/2014   HCT 37.1* 11/15/2014   MCV 94.6 11/15/2014   PLT 450* 11/15/2014    Lab Results  Component Value Date   CREATININE 0.82 11/15/2014   BUN 33* 11/15/2014   NA 145 11/15/2014   K 3.4* 11/15/2014   CL 110 11/15/2014   CO2 26 11/15/2014    Lab Results  Component Value Date   ALT 38 11/14/2014   AST 26 11/14/2014   ALKPHOS 78 11/14/2014   BILITOT 0.5 11/14/2014     Microbiology: Recent Results (from the past 240 hour(s))  Culture, respiratory (  NON-Expectorated)     Status: None   Collection Time: 11/08/14  9:45 AM  Result Value Ref Range Status   Specimen Description TRACHEAL ASPIRATE  Final   Special Requests Normal  Final   Gram Stain   Final    MODERATE WBC PRESENT, PREDOMINANTLY PMN RARE SQUAMOUS EPITHELIAL CELLS PRESENT FEW GRAM POSITIVE COCCI IN PAIRS Performed at [facility]    Culture   Final    MODERATE STAPHYLOCOCCUS AUREUS Note: RIFAMPIN AND GENTAMICIN SHOULD NOT BE USED AS SINGLE DRUGS FOR TREATMENT OF STAPH INFECTIONS. Performed at [facility]    Report Status 11/11/2014 FINAL  Final   Organism ID, Bacteria STAPHYLOCOCCUS AUREUS  Final      Susceptibility   Staphylococcus aureus - MIC*    CLINDAMYCIN <=0.25 SENSITIVE Sensitive     ERYTHROMYCIN 0.5 SENSITIVE Sensitive     GENTAMICIN <=0.5 SENSITIVE Sensitive     LEVOFLOXACIN 0.25 SENSITIVE Sensitive     OXACILLIN <=0.25 SENSITIVE  Sensitive     PENICILLIN 0.12 SENSITIVE Sensitive     RIFAMPIN <=0.5 SENSITIVE Sensitive     TRIMETH/SULFA <=10 SENSITIVE Sensitive     VANCOMYCIN 1 SENSITIVE Sensitive     TETRACYCLINE <=1 SENSITIVE Sensitive     MOXIFLOXACIN <=0.25 SENSITIVE Sensitive     * MODERATE STAPHYLOCOCCUS AUREUS  Culture, blood (routine x 2)     Status: None   Collection Time: 11/08/14 10:48 AM  Result Value Ref Range Status   Specimen Description BLOOD RIGHT HAND  Final   Special Requests   Final    BOTTLES DRAWN AEROBIC AND ANAEROBIC 6ML AER 4ML ANA   Culture   Final    BACTEROIDES FRAGILIS Note: BETA LACTAMASE POSITIVE Note: Gram Stain Report Called to,Read Back By and Verified With: Marita KansasBrianne W. RN on 11/10/14 at 05:05 by Christie NottinghamAnne Skeen Performed at [facility]    Report Status 11/12/2014 FINAL  Final  Culture, blood (routine x 2)     Status: None   Collection Time: 11/08/14 10:55 AM  Result Value Ref Range Status   Specimen Description BLOOD RIGHT HAND  Final   Special Requests BOTTLES DRAWN AEROBIC AND ANAEROBIC 5ML  Final   Culture   Final    BACTEROIDES FRAGILIS Note: BETA LACTAMASE POSITIVE Note: Gram Stain Report Called to,Read Back By and Verified With: Marita KansasBRIANNE W RN @ [phone] [facility] 11/09/14 Performed at [facility]    Report Status 11/12/2014 FINAL  Final  Culture, Urine     Status: None   Collection Time: 11/11/14 10:55 AM  Result Value Ref Range Status   Specimen Description URINE, CATHETERIZED  Final   Special Requests NONE  Final   Colony Count NO GROWTH Performed at [facility]   Final   Culture NO GROWTH Performed at [facility]   Final   Report Status 11/12/2014 FINAL  Final    Assessment: He is improving on therapy for peritonitis due to perforated sigmoid colon and healthcare associated pneumonia. His recent allergic drug rash has resolved. He will need several more weeks of antibiotic therapy dating from his Bacteroides bacteremia on  11/08/2014.  Plan: 1. Continue levofloxacin and metronidazole 2. Please call my partner, Dr. Merceda Elksob Comer [phone]), for any infectious disease questions this weekend  Cliffton AstersJohn Whitleigh Garramone, MD [facility] [phone] pager   [phone] cell 11/15/2014, 1:29 PM

## 2014-11-16 DIAGNOSIS — J9600 Acute respiratory failure, unspecified whether with hypoxia or hypercapnia: Secondary | ICD-10-CM | POA: Insufficient documentation

## 2014-11-16 LAB — BASIC METABOLIC PANEL
Anion gap: 8 (ref 5–15)
BUN: 34 mg/dL — AB (ref 6–23)
CO2: 28 mmol/L (ref 19–32)
CREATININE: 0.87 mg/dL (ref 0.50–1.35)
Calcium: 8.9 mg/dL (ref 8.4–10.5)
Chloride: 112 mmol/L (ref 96–112)
GFR calc Af Amer: >90 mL/min (ref >90)
GFR calc non Af Amer: >90 mL/min (ref >90)
Glucose, Bld: 145 mg/dL — ABNORMAL HIGH (ref 70–99)
Potassium: 3.6 mmol/L (ref 3.5–5.1)
SODIUM: 148 mmol/L — AB (ref 135–145)

## 2014-11-16 LAB — GLUCOSE, CAPILLARY
GLUCOSE-CAPILLARY: 142 mg/dL — AB (ref 70–99)
GLUCOSE-CAPILLARY: 131 mg/dL — AB (ref 70–99)
GLUCOSE-CAPILLARY: 123 mg/dL — AB (ref 70–99)
Glucose-Capillary: 136 mg/dL — ABNORMAL HIGH (ref 70–99)
Glucose-Capillary: 124 mg/dL — ABNORMAL HIGH (ref 70–99)

## 2014-11-16 MED ORDER — DEXTROSE 5 % IV SOLN
INTRAVENOUS | Status: DC
  Administered 2014-11-16 – 2014-11-19 (×4): via INTRAVENOUS
  Administered 2014-11-20: 50 mL via INTRAVENOUS

## 2014-11-16 MED ORDER — FUROSEMIDE 10 MG/ML IJ SOLN
20.0000 mg | Freq: Every day | INTRAMUSCULAR | Status: DC
  Administered 2014-11-16 – 2014-11-21 (×6): 20 mg via INTRAVENOUS
  Filled 2014-11-16 (×6): qty 2

## 2014-11-16 NOTE — Progress Notes (Signed)
PULMONARY / CRITICAL CARE MEDICINE   Name: William [name] MRN: [id number] DOB: August 29, 1951    ADMISSION DATE:  10/27/2014  CHIEF COMPLAINT:  Perforated sigmoid colon  BRIEF PATIENT DESCRIPTION:   64 y/o male former smoker presented with abd pain from perforated sigmoid colon taken to OR emergently.  Remained on vent post op.  Failed extubation 1/16, reintubated. Course complicated by vent dependence requiring trach , GNR bacteremia    SIGNIFICANT EVENTS:  1/10  Admit, Partial colectomy  1/13  Off pressors 1/14  On TNA 1/16  Extubated >> reintubated in PM 1/19  Agitation, hypertension overnight, sedation changed to propofol. 60% / 8 1/20 PEEP weaned to 5, FiO2 40%, diuresing 1/22 more hypoxemic, febrile, plan tracheostomy 1/24 rash- benadryl - zosyn stopped 1/27 agitation >> precedex 1/28 off vent since 11a  STUDIES: 1/10  CT abd/pelvis >> sigmoid colon diverticulitis with perforation, umbilical hernia, b/l renal stones, 8 mm RLL nodule 1/11  ECHO >> EF 55-60%, nml systolic fxn, PA peak 32   1/17  CT Head >> no acute intracranial abnormality, ? Small vessel ischemic changes, L maxillary sinus disease 1/25 CT abd >> collapse/consolidation in both lower lobes, Possible small abscesses in the left paracolic gutter with residual phlegmon in the anterior left anatomic pelvis, at the site of previously seen diverticular abscess.  SUBJECTIVE: TC continues, cuff went down, spoke with PMV  VITAL SIGNS: Temp:  [98 F (36.7 C)-99.9 F (37.7 C)] 98 F (36.7 C) (01/30 0400) Pulse Rate:  [79-93] 92 (01/30 0400) Resp:  [13-27] 18 (01/30 0500) BP: (108-159)/(67-91) 120/91 mmHg (01/30 0500) SpO2:  [91 %-100 %] 100 % (01/30 0500) FiO2 (%):  [28 %-35 %] 28 % (01/30 0400)   VENTILATOR SETTINGS: Vent Mode:  [-]  FiO2 (%):  [28 %-35 %] 28 %   INTAKE / OUTPUT: Intake/Output      01/29 0701 - 01/30 0700 01/30 0701 - 01/31 0700   I.V. (mL/kg) 366.3 (3)    NG/GT 1800    IV Piggyback 350    Total Intake(mL/kg) 2516.3 (20.6)    Urine (mL/kg/hr) 1465 (0.5)    Emesis/NG output 725 (0.2)    Stool 525 (0.2)    Total Output 2715     Net -198.7            PHYSICAL EXAMINATION:  Gen: no distress HEENT: NCAT EOMi, trach clean PULM:  rhonchi bases CV: s1 s2 RR distant AB: low bowel sounds, midline scar, wound vac Derm: macular erythematous rash over trunk & BL thighs -less erythema, improved Ext: SCD Neuro: awake, RASS 0, follows commands, interacting well  LABS:  CBC  Recent Labs Lab 11/12/14 0550 11/13/14 0620 11/15/14 0413  WBC 8.5 9.4 12.7*  HGB 11.3* 11.8* 12.4*  HCT 35.8* 35.3* 37.1*  PLT 431* 375 450*   BMET  Recent Labs Lab 11/14/14 0422 11/15/14 0413 11/16/14 0500  NA 144 145 148*  K 3.6 3.4* 3.6  CL 107 110 112  CO2 BUN 31* 33* 34*  CREATININE 0.86 0.82 0.87  GLUCOSE 136* 152* 145*   Electrolytes  Recent Labs Lab 11/11/14 0450  11/14/14 0422 11/15/14 0413 11/16/14 0500  CALCIUM 8.2*  < > 9.1 8.9 8.9  MG 2.0  --  2.2  --   --   PHOS 3.6  --  3.7  --   --   < > = values in this interval not displayed. Sepsis Markers No results for input(s): LATICACIDVEN, PROCALCITON, O2SATVEN  in the last 168 hours. ABG No results for input(s): PHART, PCO2ART, PO2ART in the last 168 hours. Liver Enzymes  Recent Labs Lab 11/11/14 0450 11/14/14 0422  AST 34 26  ALT 43 38  ALKPHOS 96 78  BILITOT 0.4 0.5  ALBUMIN 2.6* 2.8*   Glucose  Recent Labs Lab 11/14/14 2332 11/15/14 0411 11/15/14 0754 11/15/14 1227 11/15/14 1712 11/15/14 1944  GLUCAP 136* 132* 122* 119* 154* 102*    Imaging No results found.  ASSESSMENT / PLAN:  PULMONARY ETT 1/10 >> 1/16, 1/16 >> 1/22 Trach (Dr Jenne PaneBates) 1/22 >>  A: Acute respiratory failure with hypoxemia, worse 1/21> suspect HCAP, +/- pulm edema (net even hospitalization) Prolonged Vent course> tracheostomy 1/22 Hx of tobacco abuse with 8 mm RLL nodule on CT abd/pelvis - will need outpt FU Hx  of OSA P:   Ct ATC Continue guaifenesin, pulm toilette PMV attempt with cuff down, maximize daily, never at night Mobilize, upright Follow secretion status  CARDIOVASCULAR L femoral CVC 1/11 >> 1/18 RUE PICC 1/13 >> 1/18 (coiled) LUE PICC 1/18 >>  A: Septic shock 2nd to peritonitis >> resolved Demand ischemia > resolved HTN P:   Hold outpatient lisinopril Continue metoprolol 25mg  bid as BP permits, may need slight increase Follow todays BP values, currently 148  RENAL A: Lactic Acidosis  - resolved Hypernatremia, hyperchloremia P:   Resumed lasix 40 daily, was neg 1.3 liters, reduce dose Add d5w for Na correction while we have same goals neg balance next 24 hrs Assess mg, phos  GASTROINTESTINAL A: Perforated Sigmoid s/p Partial Colectomy (1/10) Protein calorie malnutrition P:   Post-op care, nutrition per [facility] Protonix for SUP TNA / Lipids stopped 1/24  HEMATOLOGIC A: Anemia of critical illness P: Intermittent CBC, transfuse if Hgb < 7gm/dL SQ heparin for DVT prevention Monitor hemoconcentration further mon cbc  INFECTIOUS A: Septic shock 2nd to peritonitis from perforated sigmoid colon, B frag.bacteremia Left Maxillary Sinus Disease  MSSA HCAP 1/22 Drug rash -resolved P:   7 days zosyn, micafungin > d/c'd 1/18 Vanco 1/22 >> 1/24, 1/25 >>1/26 Zosyn 1/22 >> 1/24 (rash) cipro 1/24 >> 1/26 levaquin 1/26 >> Fluconazole  1/23 >> 1/25 (rash) for thrush and skin yeast Flagyl 1/26 >>  Blood Cx 1/11 >> neg Blood Cx 1/22 >> B frag  Resp Cx 1/22 >>MSSA  Appreciate ID input -simplified to levaquin/ flagyl - needs longer duration Will look for stop date recs   ENDOCRINE A: Hyperglycemia Hx of hypothyroidism P:   SSI  Ct synthroid per tube  NEUROLOGIC A: Post-op pain control Agitation - CT head 1/17 neg for acute abnormality P: RASS Goal: 0 Supportive care:  Dc precedex , Minimize sedation as able, promote sleep wake cycle, mobilize etc  Fentanyl  int, haldol prn Active pt   Will revisit Monday, call if needed Follow lytes in am   Mcarthur Rossettianiel J. Tyson AliasFeinstein, MD, FACP Pgr: [phone] [facility]

## 2014-11-16 NOTE — Progress Notes (Signed)
[facility]  [address] [place], [place] [address] Phone: [phone] FAX: [phone]    William [name] [id number] 1950/10/23  CARE TEAM:  PCP: No primary care provider on file.  Outpatient Care Team: No care team member to display  Inpatient Treatment Team: Treatment Team: Attending Provider: Md Edison Pace, MD; Technician: Kathlen Brunswick, NT; Attending Physician: Nolon Nations, MD; Consulting Physician: Md Pccm, MD; Registered Nurse: Lady Gary, RN; Registered Nurse: Nadara Mode, RN; Registered Nurse: Meredith Mody, RN   Subjective:  Feeling better Wife in room Off vent almost 48hr Tol PMV  Objective:  Vital signs:  Filed Vitals:   11/16/14 0400 11/16/14 0500 11/16/14 0800 11/16/14 0950  BP: 155/82 120/91    Pulse: 92     Temp: 98 F (36.7 C)  98.6 F (37 C)   TempSrc: Oral  Axillary   Resp: 27 18    Height:      Weight:      SpO2: 100% 100%  97%    Last BM Date: 11/16/14  Intake/Output   Yesterday:  01/29 0701 - 01/30 0700 In: 2516.3 [I.V.:366.3; NG/GT:1800; IV Piggyback:350] Out: 2715 [IPJAS:5053; Emesis/NG output:725; Stool:525] This shift:  Total I/O In: -  Out: 400 [Urine:400]  Bowel function:  Flatus: Yes  BM: Yes in bag  Drain: NGT with tube feeds  Physical Exam:  General: Pt awake alert.  Talking.  No conversational DOE Eyes: PERRL, normal EOM.  Sclera clear.  No icterus Neuro: CN II-XII intact w/o focal sensory/motor deficits. Lymph: No head/neck/groin lymphadenopathy Psych:  No delerium/psychosis/paranoia HENT: Normocephalic, Mucus membranes moist.  No thrush.  NGT in place.  Not hoarse Neck: Supple, No tracheal deviation Chest:  Course BS Bilaterally. No chest wall pain w good excursion CV:  Pulses intact.  Regular rhythm MS: Normal AROM mjr joints.  No obvious deformity Abdomen: Obese but soft.  Wound with necrosis & exposed suture at base.  SQ w good granulation.  Colostomy  + gas/stool.  No incarcerated hernias. Ext:  SCDs BLE.  No edema.  No cyanosis Skin: No petechiae / purpura   Problem List:   Principal Problem:   Fever Active Problems:   Diverticulitis of colon with perforation s/p colectomy/colostomy 10/28/2014   Acute respiratory failure with hypoxia   Solitary pulmonary nodule on lung CT   Sleep apnea   Rash   Abscess of suppurative peritonitis   Bacteroides fragilis infection   Normocytic anemia   Obesity   Tracheostomy, acute management   Assessment  William [name]  64 y.o. male  8 Days Post-Op  Procedure(s): PARTIAL COLECTOMY Sigmoid for perforated diverticulitis COLOSTOMY EXPLORATORY LAPAROTOMY  Improved  Plan:  -tol PMV - follow -transfer to floor -try liquids PO - prob d/c NGT in AM if tolerates.  If does not, may need MBS/ST eval - but not likely given alert -IV ABx - per ID -pathology benign -Ostomy care -Wound care -cont TNA for prolonged ileus -VTE prophylaxis- SCDs, etc -mobilize as tolerated to help recovery  .   Adin Hector, M.D., F.A.C.S. Gastrointestinal and Minimally Invasive Surgery [facility] [address] [place], [place] [address] [phone] Main / Paging   11/16/2014   Results:   Labs: Results for orders placed or performed during the hospital encounter of 10/27/14 (from the past 48 hour(s))  Glucose, capillary     Status: Abnormal   Collection Time: 11/14/14  4:09 PM  Result Value  Ref Range   Glucose-Capillary 114 (H) 70 - 99 mg/dL  Glucose, capillary     Status: Abnormal   Collection Time: 11/14/14  7:35 PM  Result Value Ref Range   Glucose-Capillary 123 (H) 70 - 99 mg/dL   Comment 1 Documented in Chart    Comment 2 Notify RN   Glucose, capillary     Status: Abnormal   Collection Time: 11/14/14 11:32 PM  Result Value Ref Range   Glucose-Capillary 136 (H) 70 - 99 mg/dL  Glucose, capillary     Status: Abnormal   Collection Time: 11/15/14   4:11 AM  Result Value Ref Range   Glucose-Capillary 132 (H) 70 - 99 mg/dL   Comment 1 Notify RN   Basic metabolic panel     Status: Abnormal   Collection Time: 11/15/14  4:13 AM  Result Value Ref Range   Sodium 145 135 - 145 mmol/L   Potassium 3.4 (L) 3.5 - 5.1 mmol/L   Chloride 110 96 - 112 mmol/L   CO2 26 19 - 32 mmol/L   Glucose, Bld 152 (H) 70 - 99 mg/dL   BUN 33 (H) 6 - 23 mg/dL   Creatinine, Ser 0.82 0.50 - 1.35 mg/dL   Calcium 8.9 8.4 - 10.5 mg/dL   GFR calc non Af Amer >90 >90 mL/min   GFR calc Af Amer >90 >90 mL/min    Comment: (NOTE) The eGFR has been calculated using the CKD EPI equation. This calculation has not been validated in all clinical situations. eGFR's persistently <90 mL/min signify possible Chronic Kidney Disease.    Anion gap 9 5 - 15  CBC     Status: Abnormal   Collection Time: 11/15/14  4:13 AM  Result Value Ref Range   WBC 12.7 (H) 4.0 - 10.5 K/uL    Comment: WHITE COUNT CONFIRMED ON SMEAR   RBC 3.92 (L) 4.22 - 5.81 MIL/uL   Hemoglobin 12.4 (L) 13.0 - 17.0 g/dL   HCT 37.1 (L) 39.0 - 52.0 %   MCV 94.6 78.0 - 100.0 fL   MCH 31.6 26.0 - 34.0 pg   MCHC 33.4 30.0 - 36.0 g/dL   RDW 14.2 11.5 - 15.5 %   Platelets 450 (H) 150 - 400 K/uL  Glucose, capillary     Status: Abnormal   Collection Time: 11/15/14  7:54 AM  Result Value Ref Range   Glucose-Capillary 122 (H) 70 - 99 mg/dL   Comment 1 Documented in Chart    Comment 2 Notify RN   Glucose, capillary     Status: Abnormal   Collection Time: 11/15/14 12:27 PM  Result Value Ref Range   Glucose-Capillary 119 (H) 70 - 99 mg/dL   Comment 1 Documented in Chart    Comment 2 Notify RN   Glucose, capillary     Status: Abnormal   Collection Time: 11/15/14  5:12 PM  Result Value Ref Range   Glucose-Capillary 154 (H) 70 - 99 mg/dL   Comment 1 Documented in Chart    Comment 2 Notify RN   Glucose, capillary     Status: Abnormal   Collection Time: 11/15/14  7:44 PM  Result Value Ref Range    Glucose-Capillary 102 (H) 70 - 99 mg/dL   Comment 1 Notify RN   Glucose, capillary     Status: Abnormal   Collection Time: 11/15/14 11:29 PM  Result Value Ref Range   Glucose-Capillary 142 (H) 70 - 99 mg/dL  Glucose, capillary  Status: Abnormal   Collection Time: 11/16/14  4:13 AM  Result Value Ref Range   Glucose-Capillary 131 (H) 70 - 99 mg/dL  BMET in AM     Status: Abnormal   Collection Time: 11/16/14  5:00 AM  Result Value Ref Range   Sodium 148 (H) 135 - 145 mmol/L   Potassium 3.6 3.5 - 5.1 mmol/L   Chloride 112 96 - 112 mmol/L   CO2 28 19 - 32 mmol/L   Glucose, Bld 145 (H) 70 - 99 mg/dL   BUN 34 (H) 6 - 23 mg/dL   Creatinine, Ser 0.87 0.50 - 1.35 mg/dL   Calcium 8.9 8.4 - 10.5 mg/dL   GFR calc non Af Amer >90 >90 mL/min   GFR calc Af Amer >90 >90 mL/min    Comment: (NOTE) The eGFR has been calculated using the CKD EPI equation. This calculation has not been validated in all clinical situations. eGFR's persistently <90 mL/min signify possible Chronic Kidney Disease.    Anion gap 8 5 - 15  Glucose, capillary     Status: Abnormal   Collection Time: 11/16/14  8:16 AM  Result Value Ref Range   Glucose-Capillary 136 (H) 70 - 99 mg/dL   Comment 1 Documented in Chart    Comment 2 Notify RN     Imaging / Studies: No results found.  Medications / Allergies: per chart  Antibiotics: Anti-infectives    Start     Dose/Rate Route Frequency Ordered Stop   11/12/14 1800  metroNIDAZOLE (FLAGYL) IVPB 500 mg     500 mg100 mL/hr over 60 Minutes Intravenous Every 8 hours 11/12/14 1554     11/12/14 1700  levofloxacin (LEVAQUIN) IVPB 750 mg     750 mg100 mL/hr over 90 Minutes Intravenous Every 24 hours 11/12/14 1554     11/11/14 1800  vancomycin (VANCOCIN) 1,250 mg in sodium chloride 0.9 % 250 mL IVPB  Status:  Discontinued     1,250 mg166.7 mL/hr over 90 Minutes Intravenous Every 12 hours 11/11/14 0957 11/12/14 1554   11/11/14 1100  vancomycin (VANCOCIN) 1,250 mg in sodium  chloride 0.9 % 250 mL IVPB     1,250 mg166.7 mL/hr over 90 Minutes Intravenous  Once 11/11/14 0957 11/11/14 1202   11/10/14 0815  ciprofloxacin (CIPRO) IVPB 400 mg  Status:  Discontinued     400 mg200 mL/hr over 60 Minutes Intravenous Every 12 hours 11/10/14 0807 11/12/14 1554   11/09/14 1000  fluconazole (DIFLUCAN) tablet 100 mg  Status:  Discontinued     100 mg Oral Daily 11/09/14 0931 11/11/14 1005   11/08/14 2200  vancomycin (VANCOCIN) 1,250 mg in sodium chloride 0.9 % 250 mL IVPB  Status:  Discontinued     1,250 mg166.7 mL/hr over 90 Minutes Intravenous Every 12 hours 11/08/14 1001 11/10/14 1002   11/08/14 1200  piperacillin-tazobactam (ZOSYN) IVPB 3.375 g  Status:  Discontinued     3.375 g12.5 mL/hr over 240 Minutes Intravenous 3 times per day 11/08/14 1001 11/10/14 2245   11/08/14 1015  vancomycin (VANCOCIN) 2,500 mg in sodium chloride 0.9 % 500 mL IVPB     2,500 mg250 mL/hr over 120 Minutes Intravenous NOW 11/08/14 1001 11/08/14 1238   11/01/14 2000  vancomycin (VANCOCIN) 1,250 mg in sodium chloride 0.9 % 250 mL IVPB  Status:  Discontinued     1,250 mg166.7 mL/hr over 90 Minutes Intravenous Every 12 hours 11/01/14 0702 11/02/14 0823   10/30/14 2200  vancomycin (VANCOCIN) IVPB 1000 mg/200 mL premix  Status:  Discontinued     1,000 mg200 mL/hr over 60 Minutes Intravenous Every 8 hours 10/30/14 1746 11/01/14 0702   10/28/14 1800  vancomycin (VANCOCIN) 1,250 mg in sodium chloride 0.9 % 250 mL IVPB  Status:  Discontinued     1,250 mg166.7 mL/hr over 90 Minutes Intravenous Every 12 hours 10/28/14 0358 10/28/14 0824   10/28/14 1400  vancomycin (VANCOCIN) IVPB 1000 mg/200 mL premix  Status:  Discontinued     1,000 mg200 mL/hr over 60 Minutes Intravenous Every 12 hours 10/28/14 0824 10/30/14 1746   10/28/14 1000  micafungin (MYCAMINE) 100 mg in sodium chloride 0.9 % 100 mL IVPB  Status:  Discontinued     100 mg100 mL/hr over 1 Hours Intravenous Daily 10/28/14 0342 10/28/14 0344   10/28/14 0800   metroNIDAZOLE (FLAGYL) IVPB 500 mg  Status:  Discontinued     500 mg100 mL/hr over 60 Minutes Intravenous Every 8 hours 10/28/14 0335 10/28/14 0343   10/28/14 0600  piperacillin-tazobactam (ZOSYN) IVPB 3.375 g  Status:  Discontinued     3.375 g12.5 mL/hr over 240 Minutes Intravenous 3 times per day 10/28/14 0339 11/04/14 1152   10/28/14 0400  micafungin (MYCAMINE) 100 mg in sodium chloride 0.9 % 100 mL IVPB  Status:  Discontinued     100 mg100 mL/hr over 1 Hours Intravenous Daily 10/28/14 0342 11/04/14 1152   10/28/14 0400  vancomycin (VANCOCIN) 1,750 mg in sodium chloride 0.9 % 500 mL IVPB     1,750 mg250 mL/hr over 120 Minutes Intravenous  Once 10/28/14 0353 10/28/14 0749   10/28/14 0030  [MAR Hold]  metroNIDAZOLE (FLAGYL) IVPB 500 mg     (MAR Hold since 10/28/14 0043)   500 mg100 mL/hr over 60 Minutes Intravenous  Once 10/28/14 0026 10/28/14 0200   10/27/14 2030  piperacillin-tazobactam (ZOSYN) IVPB 3.375 g     3.375 g100 mL/hr over 30 Minutes Intravenous  Once 10/27/14 2026 10/27/14 2110       Note: Portions of this report may have been transcribed using voice recognition software. Every effort was made to ensure accuracy; however, inadvertent computerized transcription errors may be present.   Any transcriptional errors that result from this process are unintentional.

## 2014-11-17 LAB — GLUCOSE, CAPILLARY
GLUCOSE-CAPILLARY: 133 mg/dL — AB (ref 70–99)
GLUCOSE-CAPILLARY: 152 mg/dL — AB (ref 70–99)
GLUCOSE-CAPILLARY: 105 mg/dL — AB (ref 70–99)
GLUCOSE-CAPILLARY: 114 mg/dL — AB (ref 70–99)
Glucose-Capillary: 140 mg/dL — ABNORMAL HIGH (ref 70–99)
Glucose-Capillary: 108 mg/dL — ABNORMAL HIGH (ref 70–99)
Glucose-Capillary: 132 mg/dL — ABNORMAL HIGH (ref 70–99)

## 2014-11-17 LAB — BASIC METABOLIC PANEL
Anion gap: 7 (ref 5–15)
BUN: 32 mg/dL — ABNORMAL HIGH (ref 6–23)
CALCIUM: 8.8 mg/dL (ref 8.4–10.5)
CO2: 27 mmol/L (ref 19–32)
Chloride: 107 mmol/L (ref 96–112)
Creatinine, Ser: 0.75 mg/dL (ref 0.50–1.35)
GFR calc non Af Amer: >90 mL/min (ref >90)
Glucose, Bld: 148 mg/dL — ABNORMAL HIGH (ref 70–99)
Potassium: 3.5 mmol/L (ref 3.5–5.1)
SODIUM: 141 mmol/L (ref 135–145)

## 2014-11-17 LAB — MAGNESIUM: MAGNESIUM: 2.1 mg/dL (ref 1.5–2.5)

## 2014-11-17 LAB — PHOSPHORUS: Phosphorus: 3.1 mg/dL (ref 2.3–4.6)

## 2014-11-17 MED ORDER — GUAIFENESIN 100 MG/5ML PO SOLN
10.0000 mL | Freq: Two times a day (BID) | ORAL | Status: DC
  Administered 2014-11-17 – 2014-11-21 (×8): 200 mg via ORAL
  Filled 2014-11-17 (×10): qty 10

## 2014-11-17 MED ORDER — PANTOPRAZOLE SODIUM 40 MG PO PACK
40.0000 mg | PACK | Freq: Every day | ORAL | Status: DC
  Administered 2014-11-18 – 2014-11-20 (×3): 40 mg via ORAL
  Filled 2014-11-17 (×5): qty 20

## 2014-11-17 MED ORDER — LEVOTHYROXINE SODIUM 150 MCG PO TABS
150.0000 ug | ORAL_TABLET | Freq: Every day | ORAL | Status: DC
  Administered 2014-11-18 – 2014-11-21 (×4): 150 ug via ORAL
  Filled 2014-11-17: qty 1
  Filled 2014-11-17: qty 2
  Filled 2014-11-17 (×3): qty 1

## 2014-11-17 MED ORDER — ALTEPLASE 2 MG IJ SOLR
2.0000 mg | Freq: Once | INTRAMUSCULAR | Status: AC
  Administered 2014-11-18: 2 mg
  Filled 2014-11-17: qty 2

## 2014-11-17 MED ORDER — STERILE WATER FOR INJECTION IJ SOLN
INTRAMUSCULAR | Status: AC
  Filled 2014-11-17: qty 10

## 2014-11-17 MED ORDER — STERILE WATER FOR INJECTION IJ SOLN
INTRAMUSCULAR | Status: AC
  Filled 2014-11-17: qty 20

## 2014-11-17 MED ORDER — COLLAGENASE 250 UNIT/GM EX OINT
TOPICAL_OINTMENT | Freq: Two times a day (BID) | CUTANEOUS | Status: DC
  Administered 2014-11-17: 10:00:00 via TOPICAL
  Administered 2014-11-18: 1 via TOPICAL
  Administered 2014-11-18: 12:00:00 via TOPICAL
  Administered 2014-11-19: 1 via TOPICAL
  Administered 2014-11-19: 10:00:00 via TOPICAL
  Administered 2014-11-20: 1 via TOPICAL
  Filled 2014-11-17: qty 30

## 2014-11-17 MED ORDER — METOPROLOL TARTRATE 25 MG PO TABS
25.0000 mg | ORAL_TABLET | Freq: Two times a day (BID) | ORAL | Status: DC
  Administered 2014-11-17 – 2014-11-21 (×8): 25 mg via ORAL
  Filled 2014-11-17 (×9): qty 1

## 2014-11-17 MED ORDER — ALTEPLASE 2 MG IJ SOLR
2.0000 mg | Freq: Once | INTRAMUSCULAR | Status: AC
  Administered 2014-11-17: 2 mg
  Filled 2014-11-17: qty 2

## 2014-11-17 MED ORDER — ENSURE COMPLETE PO LIQD
237.0000 mL | Freq: Three times a day (TID) | ORAL | Status: DC
  Administered 2014-11-17 – 2014-11-18 (×3): 237 mL via ORAL

## 2014-11-17 NOTE — Progress Notes (Signed)
[name] [place], [place] [address] Phone: [phone] FAX: [phone]    William [name] [id number] 04-21-1951  CARE TEAM:  PCP: No primary care provider on file.  Outpatient Care Team: No care team member to display  Inpatient Treatment Team: Treatment Team: Attending Provider: Nolon Nations, MD; Technician: Kathlen Brunswick, NT; Attending Physician: Nolon Nations, MD; Consulting Physician: Md Pccm, MD; Registered Nurse: Nadara Mode, RN; Registered Nurse: Meredith Mody, RN; Registered Nurse: Eston Mould, RN   Subjective:  Feeling better Wife in room Off vent >2 days Tol PMV Tol clears  Objective:  Vital signs:  Filed Vitals:   11/17/14 0000 11/17/14 0315 11/17/14 0400 11/17/14 0547  BP: 139/98  129/78   Pulse:  85    Temp: 98.6 F (37 C)  98.5 F (36.9 C)   TempSrc: Oral  Oral   Resp: 16 20 17    Height:      Weight:    267 lb 13.7 oz (121.5 kg)  SpO2: 93% 95% 93%     Last BM Date: 11/16/14  Intake/Output   Yesterday:  01/30 0701 - 01/31 0700 In: 3317.5 [I.V.:1017.5; NG/GT:1950; IV Piggyback:350] Out: 1443 [Urine:2300; Stool:250] This shift:     Bowel function:  Flatus: Yes  BM: Yes in bag  Drain: NGT with tube feeds  Physical Exam:  General: Pt awake alert.  Talking.  No conversational DOE Eyes: PERRL, normal EOM.  Sclera clear.  No icterus Neuro: CN II-XII intact w/o focal sensory/motor deficits. Lymph: No head/neck/groin lymphadenopathy Psych:  No delerium/psychosis/paranoia HENT: Normocephalic, Mucus membranes moist.  No thrush.  NGT in place.  Not hoarse Neck: Supple, No tracheal deviation Chest:  Course BS Bilaterally. No chest wall pain w good excursion CV:  Pulses intact.  Regular rhythm MS: Normal AROM mjr joints.  No obvious deformity  Abdomen: Obese but soft.  SQ w good granulation.  Colostomy + gas/stool.  No incarcerated hernias.  Wound with necrosis &  exposed suture at base.  I sharply debrided:  1.  Progress note or procedure note with a detailed description of the procedure.  2.  Tool used for debridement (curette, scapel, etc.)  SCISSORS  3.  Frequency of surgical debridement.   TODAY  4.  Measurement of total devitalized tissue (wound surface) before and after surgical debridement.   13 x 8 x 2cm.  Pre, 10 x 6 x0.5cm post  5.  Area and depth of devitalized tissue removed from wound.  13 x 8 x 1-2cm.  6.  Blood loss and description of tissue removed.  Min EBL, Necrotic & infected  7.  Evidence of the progress of the wound's response to treatment.  A.  Current wound volume (current dimensions and depth).  13 x 10 x 7cm    B.  Presence (and extent of) of infection.  Base of wound  C.  Presence (and extent of) of non viable tissue.  See above  D.  Other material in the wound that is expected to inhibit healing.  No but exposed Novafil fascial sutures  8.  Was there any viable tissue removed (measurements): no      Ext:  SCDs BLE.  No edema.  No cyanosis Skin: No petechiae / purpura   Problem List:   Principal Problem:   Diverticulitis of colon with perforation s/p colectomy/colostomy 10/28/2014 Active Problems:   Acute respiratory failure with hypoxia   Solitary pulmonary nodule on lung CT  Sleep apnea   Fever   Rash   Abscess of suppurative peritonitis   Bacteroides fragilis infection   Normocytic anemia   Obesity   Tracheostomy, acute management   Acute respiratory failure   Assessment  William [name]  64 y.o. male  9 Days Post-Op  Procedure(s): PARTIAL COLECTOMY Sigmoid for perforated diverticulitis COLOSTOMY EXPLORATORY LAPAROTOMY  Improving  Plan:  -tol PMV - follow -transfer to floor when bed available -D/C NGT.  Try PO & adv diet -IV ABx - per ID -pathology benign -Ostomy care -Wound care - add santyl to base.  Prob will need redebridement -cont TNA for prolonged ileus -VTE prophylaxis-  SCDs, etc -mobilize as tolerated to help recovery  .   Adin Hector, M.D., F.A.C.S. Gastrointestinal and Minimally Invasive Surgery [facility] [address], [place], [place] [address] [phone] Main / Paging   11/17/2014   Results:   Labs: Results for orders placed or performed during the hospital encounter of 10/27/14 (from the past 48 hour(s))  Glucose, capillary     Status: Abnormal   Collection Time: 11/15/14  7:54 AM  Result Value Ref Range   Glucose-Capillary 122 (H) 70 - 99 mg/dL   Comment 1 Documented in Chart    Comment 2 Notify RN   Glucose, capillary     Status: Abnormal   Collection Time: 11/15/14 12:27 PM  Result Value Ref Range   Glucose-Capillary 119 (H) 70 - 99 mg/dL   Comment 1 Documented in Chart    Comment 2 Notify RN   Glucose, capillary     Status: Abnormal   Collection Time: 11/15/14  5:12 PM  Result Value Ref Range   Glucose-Capillary 154 (H) 70 - 99 mg/dL   Comment 1 Documented in Chart    Comment 2 Notify RN   Glucose, capillary     Status: Abnormal   Collection Time: 11/15/14  7:44 PM  Result Value Ref Range   Glucose-Capillary 102 (H) 70 - 99 mg/dL   Comment 1 Notify RN   Glucose, capillary     Status: Abnormal   Collection Time: 11/15/14 11:29 PM  Result Value Ref Range   Glucose-Capillary 142 (H) 70 - 99 mg/dL  Glucose, capillary     Status: Abnormal   Collection Time: 11/16/14  4:13 AM  Result Value Ref Range   Glucose-Capillary 131 (H) 70 - 99 mg/dL  BMET in AM     Status: Abnormal   Collection Time: 11/16/14  5:00 AM  Result Value Ref Range   Sodium 148 (H) 135 - 145 mmol/L   Potassium 3.6 3.5 - 5.1 mmol/L   Chloride 112 96 - 112 mmol/L   CO2 28 19 - 32 mmol/L   Glucose, Bld 145 (H) 70 - 99 mg/dL   BUN 34 (H) 6 - 23 mg/dL   Creatinine, Ser 0.87 0.50 - 1.35 mg/dL   Calcium 8.9 8.4 - 10.5 mg/dL   GFR calc non Af Amer >90 >90 mL/min   GFR calc Af Amer >90 >90 mL/min    Comment:  (NOTE) The eGFR has been calculated using the CKD EPI equation. This calculation has not been validated in all clinical situations. eGFR's persistently <90 mL/min signify possible Chronic Kidney Disease.    Anion gap 8 5 - 15  Glucose, capillary     Status: Abnormal   Collection Time: 11/16/14  8:16 AM  Result Value Ref Range   Glucose-Capillary 136 (H) 70 - 99  mg/dL   Comment 1 Documented in Chart    Comment 2 Notify RN   Glucose, capillary     Status: Abnormal   Collection Time: 11/16/14 12:24 PM  Result Value Ref Range   Glucose-Capillary 123 (H) 70 - 99 mg/dL   Comment 1 Documented in Chart    Comment 2 Notify RN   Glucose, capillary     Status: Abnormal   Collection Time: 11/16/14  4:35 PM  Result Value Ref Range   Glucose-Capillary 124 (H) 70 - 99 mg/dL   Comment 1 Notify RN    Comment 2 Documented in Chart   Basic metabolic panel     Status: Abnormal   Collection Time: 11/17/14  4:57 AM  Result Value Ref Range   Sodium 141 135 - 145 mmol/L   Potassium 3.5 3.5 - 5.1 mmol/L   Chloride 107 96 - 112 mmol/L   CO2 27 19 - 32 mmol/L   Glucose, Bld 148 (H) 70 - 99 mg/dL   BUN 32 (H) 6 - 23 mg/dL   Creatinine, Ser 0.75 0.50 - 1.35 mg/dL   Calcium 8.8 8.4 - 10.5 mg/dL   GFR calc non Af Amer >90 >90 mL/min   GFR calc Af Amer >90 >90 mL/min    Comment: (NOTE) The eGFR has been calculated using the CKD EPI equation. This calculation has not been validated in all clinical situations. eGFR's persistently <90 mL/min signify possible Chronic Kidney Disease.    Anion gap 7 5 - 15  Phosphorus     Status: None   Collection Time: 11/17/14  4:57 AM  Result Value Ref Range   Phosphorus 3.1 2.3 - 4.6 mg/dL  Magnesium     Status: None   Collection Time: 11/17/14  4:57 AM  Result Value Ref Range   Magnesium 2.1 1.5 - 2.5 mg/dL    Imaging / Studies: No results found.  Medications / Allergies: per chart  Antibiotics: Anti-infectives    Start     Dose/Rate Route Frequency  Ordered Stop   11/12/14 1800  metroNIDAZOLE (FLAGYL) IVPB 500 mg     500 mg100 mL/hr over 60 Minutes Intravenous Every 8 hours 11/12/14 1554     11/12/14 1700  levofloxacin (LEVAQUIN) IVPB 750 mg     750 mg100 mL/hr over 90 Minutes Intravenous Every 24 hours 11/12/14 1554     11/11/14 1800  vancomycin (VANCOCIN) 1,250 mg in sodium chloride 0.9 % 250 mL IVPB  Status:  Discontinued     1,250 mg166.7 mL/hr over 90 Minutes Intravenous Every 12 hours 11/11/14 0957 11/12/14 1554   11/11/14 1100  vancomycin (VANCOCIN) 1,250 mg in sodium chloride 0.9 % 250 mL IVPB     1,250 mg166.7 mL/hr over 90 Minutes Intravenous  Once 11/11/14 0957 11/11/14 1202   11/10/14 0815  ciprofloxacin (CIPRO) IVPB 400 mg  Status:  Discontinued     400 mg200 mL/hr over 60 Minutes Intravenous Every 12 hours 11/10/14 0807 11/12/14 1554   11/09/14 1000  fluconazole (DIFLUCAN) tablet 100 mg  Status:  Discontinued     100 mg Oral Daily 11/09/14 0931 11/11/14 1005   11/08/14 2200  vancomycin (VANCOCIN) 1,250 mg in sodium chloride 0.9 % 250 mL IVPB  Status:  Discontinued     1,250 mg166.7 mL/hr over 90 Minutes Intravenous Every 12 hours 11/08/14 1001 11/10/14 1002   11/08/14 1200  piperacillin-tazobactam (ZOSYN) IVPB 3.375 g  Status:  Discontinued     3.375 g12.5 mL/hr over 240 Minutes  Intravenous 3 times per day 11/08/14 1001 11/10/14 2245   11/08/14 1015  vancomycin (VANCOCIN) 2,500 mg in sodium chloride 0.9 % 500 mL IVPB     2,500 mg250 mL/hr over 120 Minutes Intravenous NOW 11/08/14 1001 11/08/14 1238   11/01/14 2000  vancomycin (VANCOCIN) 1,250 mg in sodium chloride 0.9 % 250 mL IVPB  Status:  Discontinued     1,250 mg166.7 mL/hr over 90 Minutes Intravenous Every 12 hours 11/01/14 0702 11/02/14 0823   10/30/14 2200  vancomycin (VANCOCIN) IVPB 1000 mg/200 mL premix  Status:  Discontinued     1,000 mg200 mL/hr over 60 Minutes Intravenous Every 8 hours 10/30/14 1746 11/01/14 0702   10/28/14 1800  vancomycin (VANCOCIN) 1,250 mg in  sodium chloride 0.9 % 250 mL IVPB  Status:  Discontinued     1,250 mg166.7 mL/hr over 90 Minutes Intravenous Every 12 hours 10/28/14 0358 10/28/14 0824   10/28/14 1400  vancomycin (VANCOCIN) IVPB 1000 mg/200 mL premix  Status:  Discontinued     1,000 mg200 mL/hr over 60 Minutes Intravenous Every 12 hours 10/28/14 0824 10/30/14 1746   10/28/14 1000  micafungin (MYCAMINE) 100 mg in sodium chloride 0.9 % 100 mL IVPB  Status:  Discontinued     100 mg100 mL/hr over 1 Hours Intravenous Daily 10/28/14 0342 10/28/14 0344   10/28/14 0800  metroNIDAZOLE (FLAGYL) IVPB 500 mg  Status:  Discontinued     500 mg100 mL/hr over 60 Minutes Intravenous Every 8 hours 10/28/14 0335 10/28/14 0343   10/28/14 0600  piperacillin-tazobactam (ZOSYN) IVPB 3.375 g  Status:  Discontinued     3.375 g12.5 mL/hr over 240 Minutes Intravenous 3 times per day 10/28/14 0339 11/04/14 1152   10/28/14 0400  micafungin (MYCAMINE) 100 mg in sodium chloride 0.9 % 100 mL IVPB  Status:  Discontinued     100 mg100 mL/hr over 1 Hours Intravenous Daily 10/28/14 0342 11/04/14 1152   10/28/14 0400  vancomycin (VANCOCIN) 1,750 mg in sodium chloride 0.9 % 500 mL IVPB     1,750 mg250 mL/hr over 120 Minutes Intravenous  Once 10/28/14 0353 10/28/14 0749   10/28/14 0030  [MAR Hold]  metroNIDAZOLE (FLAGYL) IVPB 500 mg     (MAR Hold since 10/28/14 0043)   500 mg100 mL/hr over 60 Minutes Intravenous  Once 10/28/14 0026 10/28/14 0200   10/27/14 2030  piperacillin-tazobactam (ZOSYN) IVPB 3.375 g     3.375 g100 mL/hr over 30 Minutes Intravenous  Once 10/27/14 2026 10/27/14 2110       Note: Portions of this report may have been transcribed using voice recognition software. Every effort was made to ensure accuracy; however, inadvertent computerized transcription errors may be present.   Any transcriptional errors that result from this process are unintentional.

## 2014-11-17 NOTE — Consult Note (Signed)
WOC ostomy follow up Stoma type/location: LLQ Colostomy Stomal assessment/size: 1 inch x 1 and 3/4 inch oval with mild elevation. Pink, moist. Peristomal assessment: intact, clear.  Mild depression warrants placement of a skin barrier ring Treatment options for stomal/peristomal skin: skin barrier ring Output tan liquid effluent Ostomy pouching: 2pc., 2 and 3/4 inch pouching system with skin barrier ring Education provided: Wife present for teaching session and patient is alert and participating.  Wife will likely be primary care provider for stoma.  Wife observes stoma resizing and I include her in my decision to remain in the 2 and 3/4 pouching system vs a 2 and 1/4 inch pouching system. Patient eager to talk and discuss the fact that he has known other people with ostomies, that he can "handle" this and that he is glad to be alive. Pouching system medial edge is trimmed to accommodate midline wound.  Wound is now being treated with an enzymatic debriding agent and periodic wound debridements may be required per Dr. Costella HatcherGross/CCS. Please order HHRN if you agree that these services are likely to be required post acute care stay.  Next pouch change due on Wednesday, 2/3 or before. Enrolled [place] in [facility] Discharge program: Yes WOC nursing team will follow, and will remain available to this patient, the nursing, surgical and medical teams.   Thanks, Ladona MowLaurie Sariah Henkin, MSN, RN, GNP, [place], CWON-AP [phone])

## 2014-11-18 LAB — COMPREHENSIVE METABOLIC PANEL
ALT: 29 U/L (ref 0–53)
ANION GAP: 9 (ref 5–15)
AST: 22 U/L (ref 0–37)
Albumin: 2.9 g/dL — ABNORMAL LOW (ref 3.5–5.2)
Alkaline Phosphatase: 61 U/L (ref 39–117)
BILIRUBIN TOTAL: 0.4 mg/dL (ref 0.3–1.2)
BUN: 26 mg/dL — AB (ref 6–23)
CHLORIDE: 104 mmol/L (ref 96–112)
CO2: 26 mmol/L (ref 19–32)
Calcium: 8.8 mg/dL (ref 8.4–10.5)
Creatinine, Ser: 0.84 mg/dL (ref 0.50–1.35)
GFR calc Af Amer: >90 mL/min (ref >90)
GFR calc non Af Amer: >90 mL/min (ref >90)
Glucose, Bld: 105 mg/dL — ABNORMAL HIGH (ref 70–99)
Potassium: 3.7 mmol/L (ref 3.5–5.1)
SODIUM: 139 mmol/L (ref 135–145)
TOTAL PROTEIN: 6.3 g/dL (ref 6.0–8.3)

## 2014-11-18 LAB — CBC
HCT: 37.9 % — ABNORMAL LOW (ref 39.0–52.0)
HEMOGLOBIN: 12.2 g/dL — AB (ref 13.0–17.0)
MCH: 31 pg (ref 26.0–34.0)
MCHC: 32.2 g/dL (ref 30.0–36.0)
MCV: 96.2 fL (ref 78.0–100.0)
PLATELETS: 432 10*3/uL — AB (ref 150–400)
RBC: 3.94 MIL/uL — ABNORMAL LOW (ref 4.22–5.81)
RDW: 14.6 % (ref 11.5–15.5)
WBC: 12 10*3/uL — ABNORMAL HIGH (ref 4.0–10.5)

## 2014-11-18 LAB — GLUCOSE, CAPILLARY
Glucose-Capillary: 135 mg/dL — ABNORMAL HIGH (ref 70–99)
Glucose-Capillary: 93 mg/dL (ref 70–99)
Glucose-Capillary: 105 mg/dL — ABNORMAL HIGH (ref 70–99)
Glucose-Capillary: 169 mg/dL — ABNORMAL HIGH (ref 70–99)
Glucose-Capillary: 83 mg/dL (ref 70–99)

## 2014-11-18 MED ORDER — INSULIN ASPART 100 UNIT/ML ~~LOC~~ SOLN: 0.0000 [IU] | Freq: Three times a day (TID) | SUBCUTANEOUS | Status: DC

## 2014-11-18 MED ORDER — ENSURE COMPLETE PO LIQD
237.0000 mL | Freq: Two times a day (BID) | ORAL | Status: DC
  Administered 2014-11-19 – 2014-11-21 (×4): 237 mL via ORAL

## 2014-11-18 NOTE — Progress Notes (Signed)
Patient ID: William OuJoseph [name], male   DOB: 05/14/1951, 64 y.o.   MRN: [id number]         [facility]    Date of Admission:  10/27/2014   Total days of antibiotics 11 (second course)        Day 6 levofloxacin        Day 6 metronidazole          Principal Problem:   Diverticulitis of colon with perforation s/p colectomy/colostomy 10/28/2014 Active Problems:   Acute respiratory failure with hypoxia   Solitary pulmonary nodule on lung CT   Sleep apnea   Fever   Rash   Abscess of suppurative peritonitis   Bacteroides fragilis infection   Normocytic anemia   Obesity   Tracheostomy, acute management   Acute respiratory failure   . antiseptic oral rinse  7 mL Mouth Rinse QID  . bacitracin   Topical BID  . chlorhexidine  15 mL Mouth Rinse BID  . collagenase   Topical BID  . enoxaparin (LOVENOX) injection  40 mg Subcutaneous Daily  . feeding supplement (ENSURE COMPLETE)  237 mL Oral TID WC  . fentaNYL  25 mcg Intravenous Once  . furosemide  20 mg Intravenous Daily  . guaiFENesin  10 mL Oral BID  . insulin aspart  0-20 Units Subcutaneous 6 times per day  . insulin glargine  15 Units Subcutaneous Daily  . levofloxacin (LEVAQUIN) IV  750 mg Intravenous Q24H  . levothyroxine  150 mcg Oral QAC breakfast  . lip balm  1 application Topical BID  . metoprolol tartrate  25 mg Oral BID  . metronidazole  500 mg Intravenous Q8H  . nystatin   Topical BID  . pantoprazole sodium  40 mg Oral Q1200  . sodium chloride  10-40 mL Intracatheter Q12H    Subjective:  He is sitting up in a chair. He said he worked with speech therapy getting used to speaking valve. Also drank liquids, soup without difficulty. Walked with pt with walker. Doing well.    Past Medical History  Diagnosis Date  . Renal disorder     kidney stones  . Hypertension   . Thyroid disease     History  Substance Use Topics  . Smoking status: Former Games developermoker  . Smokeless tobacco: Not on file  .  Alcohol Use: Not on file    History reviewed. No pertinent family history. Allergies  Allergen Reactions  . Aloprim [Allopurinol] Other (See Comments)    Unknown reaction  . Simvastatin Other (See Comments)    Unknown reaction    OBJECTIVE: Blood pressure 122/76, pulse 88, temperature 98.7 F (37.1 C), temperature source Oral, resp. rate 20, height 5' 11.5" (1.816 m), weight 267 lb 13.7 oz (121.5 kg), SpO2 94 %. General: z x o by 3 in NAD, sitting up in chair Heent= moist mucuous membranes Neck = speaking valve, trach in place Skin: His rash has resolved Lungs: ctab Strong cough.  Cor: Distant but regular S1 and S2 with no murmurs heard Abdomen: Abdomen distended but soft and nontender. Incision wound has pink granulation tissue in wound bed. LLQ ostomy with stool. Ext= no c/c/e  Lab Results Lab Results  Component Value Date   WBC 12.0* 11/18/2014   HGB 12.2* 11/18/2014   HCT 37.9* 11/18/2014   MCV 96.2 11/18/2014   PLT 432* 11/18/2014    Lab Results  Component Value Date   CREATININE 0.84 11/18/2014   BUN 26* 11/18/2014  NA 139 11/18/2014   K 3.7 11/18/2014   CL 104 11/18/2014   CO2 26 11/18/2014    Lab Results  Component Value Date   ALT 29 11/18/2014   AST 22 11/18/2014   ALKPHOS 61 11/18/2014   BILITOT 0.4 11/18/2014     Microbiology: Recent Results (from the past 240 hour(s))  Culture, Urine     Status: None   Collection Time: 11/11/14 10:55 AM  Result Value Ref Range Status   Specimen Description URINE, CATHETERIZED  Final   Special Requests NONE  Final   Colony Count NO GROWTH Performed at [facility]   Final   Culture NO GROWTH Performed at [facility]   Final   Report Status 11/12/2014 FINAL  Final    Assessment:  peritonitis due to perforated sigmoid colon and healthcare associated pneumonia. His recent allergic drug rash has resolved. He will need several more weeks of antibiotic therapy dating from his Bacteroides  bacteremia on 11/08/2014.  Plan: 1. Continue levofloxacin and metronidazole to end of 11/21/2014 for last day of antibiotics. i would continue with IV antibiotics until end of course unless he is able to take oral medications, where it can be switched from iv to po.  Will sign off. Call if questions   Judyann Munson, MD [facility] [phone] pager  [phone] cell 11/18/2014, 3:14 PM

## 2014-11-18 NOTE — Progress Notes (Signed)
Speech Language Pathology Treatment: William [name] Speaking valve  Patient Details Name: William [name] MRN: [id number] DOB: 02-06-1951 Today's Date: 11/18/2014 Time: 3685-9923 SLP Time Calculation (min) (ACUTE ONLY): 24 min  Assessment / Plan / Recommendation Clinical Impression  Pt sitting upright in chair with PMSV in place after transferred to floor from ICU.  He reports wearing his valve nearly all weekend with good tolerance.  Pt is expectorating some secretions via trach *due to viscocity*.  All vitals stable without breath stacking noted during treatment session.    SLP demonstrated to pt removal of PMSV and had pt demonstrate removal x5 *once with mirror, 4 times without.  Advised pt to have spouse, RN place valve but wanted him to be independent with removal.  Reviewed using teach back PMSV usage and when unable to wear.  Phonatory strength appears better today and pt reports is nearing baseline (8 of 10).    Recommend intermittent supervision with PMSV. Will sign off as all education completed and pt met goals.   All education completed    HPI HPI: 64 yo male with HTN, nephrolithiasis who presented to [facility] on 1/10 with abdominal pain secondary to a perforated sigmoid. Taken emergently to OR 1/10 for partial colectomy, VDRF, currently with  tracheostomy and on Tcollar trials    Pertinent Vitals Pain Assessment: No/denies pain  SLP Plan  All goals met;Discharge SLP treatment due to (comment)    Recommendations        Patient may use Passy-Muir Speech Valve: During all waking hours (remove during sleep);During all therapies with supervision;During PO intake/meals;Caregiver trained to provide supervision PMSV Supervision: Intermittent MD: Please consider changing trach tube to : Other (comment);Cuffless (when indicated)       Follow up Recommendations: None Plan: All goals met;Discharge SLP treatment due to (comment)    [place], [facility] SLP [phone]

## 2014-11-18 NOTE — Progress Notes (Signed)
Physical Therapy Treatment Patient Details Name: William [name] MRN: [id number] DOB: 07/19/1951 Today's Date: 11/18/2014    History of Present Illness [name] with HTN, nephrolithiasis who [facility] with abdominal pain secondary to a perforated sigmoid. Taken emergently to OR 1/10 for partial colectomy, VDRF, currently with  tracheostomy     PT Comments    Patient has made  Great improvement since last visit. Patient able to participate in therapy. RECOMMEND CONSULT FOR CIR for rehab. .  Follow Up Recommendations  CIR     Equipment Recommendations  None recommended by PT    Recommendations for Other Services       Precautions / Restrictions Precautions Precautions: Fall Precaution Comments: MONITOR  sats, may need O2 for walking    Mobility  Bed Mobility                  Transfers Overall transfer level: Needs assistance Equipment used: Rolling walker (2 wheeled) Transfers: Sit to/from Stand Sit to Stand: +2 safety/equipment;Mod assist         General transfer comment: extra time  for rising from recliner, cues for hand placement and safety,   Ambulation/Gait Ambulation/Gait assistance: +2 safety/equipment Ambulation Distance (Feet): 5 Feet Assistive device: Rolling walker (2 wheeled) Gait Pattern/deviations: Step-to pattern;Shuffle     General Gait Details: decreased  R foot clearance, walked 5' forward and backward, very shakey, decreased  stability going backward.   Stairs            Wheelchair Mobility    Modified Rankin (Stroke Patients Only)       Balance                                    Cognition Arousal/Alertness: Awake/alert Behavior During Therapy: WFL for tasks assessed/performed Overall Cognitive Status: Within Functional Limits for tasks assessed                      Exercises General Exercises - Upper Extremity Shoulder Flexion: AROM;Both;5 reps Shoulder ABduction: AROM;Both;5  reps Shoulder Horizontal ABduction: AROM;Both;5 reps Shoulder Horizontal ADduction: AROM;Both;5 reps General Exercises - Lower Extremity Ankle Circles/Pumps: AROM;Both;10 reps Long Arc Quad: AROM;Both;10 reps Hip ABduction/ADduction: AROM;Both;10 reps Hip Flexion/Marching: AROM;Both;10 reps    General Comments        Pertinent Vitals/Pain Pain Assessment: No/denies pain    Home Living                      Prior Function            PT Goals (current goals can now be found in the care plan section) Progress towards PT goals: Progressing toward goals    Frequency  Min 3X/week    PT Plan Discharge plan needs to be updated;Frequency needs to be updated    Co-evaluation             End of Session   Activity Tolerance: Patient tolerated treatment well Patient left: in chair;with call bell/phone within reach;with family/visitor present     Time: [id number] PT Time Calculation (min) (ACUTE ONLY): 27 min  Charges:  $Gait Training: 8-22 mins $Therapeutic Exercise: 8-22 mins                    G Codes:      Rada HayHill, Alean Kromer Elizabeth 11/18/2014, 3:17 PM

## 2014-11-18 NOTE — Progress Notes (Signed)
NUTRITION FOLLOW UP  Intervention:   - Ensure Complete po BID, each supplement provides 350 kcal and 13 grams of protein - Diet advancement per MD - RD to continue to monitor  Nutrition Dx:   Inadequate oral intake related to inability to eat as evidenced by NPO; improving  Goal:   Pt to meet >/= 90% of their estimated nutrition needs; improving  Monitor:   GI profile, weight trend, diet advancement, labs, acceptance of supplements  Assessment:   64 y/o male former smoker presented with abd pain from perforated sigmoid colon taken to OR emergently. Remained on vent post op. Failed extubation 1/16, reintubated.   1/10 Admit, Partial colectomy 1/16 extubated, reintubated in pm 1/22 tracheostomy 1/24 TPN stopped 1/28 Pt off vent  - Current diet is full liquids- 2 g Na with 2000 mL fluid restriction. Pt thankful to be tolerating full liquid diet. Per pt and wife, pt is eating very well. PO recorded as 100%. Today pt has tolerated soup, pudding, coffee, and jello. Will order nutritional supplements to increase protein intake. Per wife, MD to advance diet to pureed diet soon.  - Weight has decreased 13 lbs since admission wt.  - Labs Reviewed  Height: Ht Readings from Last 1 Encounters:  10/28/14 5' 11.5" (1.816 m)    Weight Status:   Wt Readings from Last 1 Encounters:  11/17/14 267 lb 13.7 oz (121.5 kg)  11/06/14 275 lb 10/30/14 293 lb 10/27/14 280 lb  Re-estimated needs(1/21):  Kcal: 2300-2500 Protein:~160 gram protein Fluid: per MD  Skin:  Surgical incision on abd, +1 generalized edema, +1 perineal edema, +1 RLE, +1 LLE edema  Diet Order: Diet full liquid   Intake/Output Summary (Last 24 hours) at 11/18/14 1618 Last data filed at 11/18/14 1614  Gross per 24 hour  Intake    850 ml  Output   1750 ml  Net   -900 ml    Last BM: 2/1- 300 mL output from colostomy  Labs:   Recent Labs Lab 11/14/14 0422  11/16/14 0500 11/17/14 0457 11/18/14 0430  NA 144   < > 148* 141 139  K 3.6  < > 3.6 3.5 3.7  CL 107  < > 112 107 104  CO2 27  < > 28 27 26   BUN 31*  < > 34* 32* 26*  CREATININE 0.86  < > 0.87 0.75 0.84  CALCIUM 9.1  < > 8.9 8.8 8.8  MG 2.2  --   --  2.1  --   PHOS 3.7  --   --  3.1  --   GLUCOSE 136*  < > 145* 148* 105*  < > = values in this interval not displayed.  CBG (last 3)   Recent Labs  11/17/14 2347 11/18/14 0320 11/18/14 0808  GLUCAP 135* 93 105*    Scheduled Meds: . antiseptic oral rinse  7 mL Mouth Rinse QID  . bacitracin   Topical BID  . chlorhexidine  15 mL Mouth Rinse BID  . collagenase   Topical BID  . enoxaparin (LOVENOX) injection  40 mg Subcutaneous Daily  . feeding supplement (ENSURE COMPLETE)  237 mL Oral TID WC  . fentaNYL  25 mcg Intravenous Once  . furosemide  20 mg Intravenous Daily  . guaiFENesin  10 mL Oral BID  . insulin aspart  0-20 Units Subcutaneous TID WC  . insulin glargine  15 Units Subcutaneous Daily  . levofloxacin (LEVAQUIN) IV  750 mg Intravenous Q24H  . levothyroxine  150 mcg Oral QAC breakfast  . lip balm  1 application Topical BID  . metoprolol tartrate  25 mg Oral BID  . metronidazole  500 mg Intravenous Q8H  . nystatin   Topical BID  . pantoprazole sodium  40 mg Oral Q1200  . sodium chloride  10-40 mL Intracatheter Q12H    Continuous Infusions: . dextrose 50 mL/hr at 11/18/14 1538    Emmaline Kluver MS, RD, LDN Pager # [phone]

## 2014-11-18 NOTE — Progress Notes (Signed)
Patient ID: William [name], male   DOB: 06/04/1951, 64 y.o.   MRN: [id number]  [facility] Progress [facility]  POD# 21  Subjective: Patient up in chair, awake, responsive.  Wife at bedside.  Taking full liquid diet.  Objective: Vital signs in last 24 hours: Temp:  [98.1 F (36.7 C)-98.7 F (37.1 C)] 98.5 F (36.9 C) (01/31 2000) Pulse Rate:  [80-93] 93 (02/01 0805) Resp:  [15-30] 15 (02/01 0805) BP: (101-157)/(66-93) 157/93 mmHg (02/01 0805) SpO2:  [88 %-99 %] 95 % (02/01 0805) FiO2 (%):  [28 %] 28 % (02/01 0750) Last BM Date: 11/16/14  Intake/Output from previous day: 01/31 0701 - 02/01 0700 In: 1770 [P.O.:520; I.V.:900; IV Piggyback:350] Out: 1875 [Urine:1575; Stool:300]  Exam: HEENT - clear, not icteric Neck - soft, trach in place with valve Chest - clear bilaterally Cor - RRR, no murmur Abd - obese; BS present; dressing intact, ostomy bag Ext - no significant edema Neuro - grossly intact, no focal deficits  Lab Results:   Recent Labs  11/18/14 0430  WBC 12.0*  HGB 12.2*  HCT 37.9*  PLT 432*     Recent Labs  11/17/14 0457 11/18/14 0430  NA 141 139  K 3.5 3.7  CL 107 104  CO2 27 26  GLUCOSE 148* 105*  BUN 32* 26*  CREATININE 0.75 0.84  CALCIUM 8.8 8.8    Studies/Results: No results found.  Assessment / Plan: Principal Problem:  Diverticulitis of colon with perforation s/p colectomy/colostomy 10/28/2014 Active Problems:  Tracheostomy, acute management  Tube feedings, advancing diet   Transfer to floor today if bed available   Wound and ostomy care per WOC nurse  Velora Hecklerodd M. Gianmarco Roye, MD, [facility] Office: [phone]  11/18/2014

## 2014-11-19 DIAGNOSIS — G7281 Critical illness myopathy: Secondary | ICD-10-CM

## 2014-11-19 DIAGNOSIS — J9620 Acute and chronic respiratory failure, unspecified whether with hypoxia or hypercapnia: Secondary | ICD-10-CM

## 2014-11-19 DIAGNOSIS — Z930 Tracheostomy status: Secondary | ICD-10-CM

## 2014-11-19 LAB — GLUCOSE, CAPILLARY
Glucose-Capillary: 111 mg/dL — ABNORMAL HIGH (ref 70–99)
Glucose-Capillary: 111 mg/dL — ABNORMAL HIGH (ref 70–99)
Glucose-Capillary: 90 mg/dL (ref 70–99)
Glucose-Capillary: 119 mg/dL — ABNORMAL HIGH (ref 70–99)

## 2014-11-19 MED ORDER — SACCHAROMYCES BOULARDII 250 MG PO CAPS
250.0000 mg | ORAL_CAPSULE | Freq: Two times a day (BID) | ORAL | Status: DC
  Administered 2014-11-19 – 2014-11-21 (×4): 250 mg via ORAL
  Filled 2014-11-19 (×5): qty 1

## 2014-11-19 NOTE — Progress Notes (Signed)
Physical Therapy Treatment Patient Details Name: William [name] MRN: [id number] DOB: 08/24/1951 Today's Date: 11/19/2014    History of Present Illness [age] M with HTN, nephrolithiasis who [facility] with abdominal pain secondary to a perforated sigmoid. Taken emergently to OR 1/10 for partial colectomy, VDRF, currently with  tracheostomy     PT Comments    Pt sitting EOB on arrival.  Assisted with amb in hallway with recliner following for safety as pt demon unsteady/shaky gait and c/o L knee pain (arthritis) and weakness.    Follow Up Recommendations  CIR     Equipment Recommendations       Recommendations for Other Services       Precautions / Restrictions Precautions Precautions: Fall Restrictions Weight Bearing Restrictions: No    Mobility  Bed Mobility               General bed mobility comments: pt sitting EOB on arrival  Transfers Overall transfer level: Needs assistance Equipment used: Rolling walker (2 wheeled) Transfers: Sit to/from Stand Sit to Stand: Mod assist         General transfer comment: extra time and forward momentum with sit to stand.  difficulty with control for stand to sit.   Ambulation/Gait Ambulation/Gait assistance: Min assist;+2 safety/equipment;Mod assist Ambulation Distance (Feet): 75 Feet x 2 (x 2 sitting rest breaks) Assistive device: Rolling walker (2 wheeled) Gait Pattern/deviations: Step-through pattern;Decreased stride length;Trunk flexed Gait velocity: decreased   General Gait Details: had recliner follow for safety and to be able to "walk the distance".  Tolerated increased amb distance.  still unsteady/shaky.   Stairs            Wheelchair Mobility    Modified Rankin (Stroke Patients Only)       Balance                                    Cognition                            Exercises      General Comments        Pertinent Vitals/Pain Pain Assessment:  No/denies pain    Home Living                      Prior Function            PT Goals (current goals can now be found in the care plan section)      Frequency  Min 3X/week    PT Plan      Co-evaluation             End of Session Equipment Utilized During Treatment: Gait belt Activity Tolerance: Patient tolerated treatment well Patient left: in chair;with call bell/phone within reach;with family/visitor present     Time: 1350-1419 PT Time Calculation (min) (ACUTE ONLY): 29 min  Charges:  $Gait Training: 23-37 mins                    G Codes:      Felecia ShellingLori Theodore Rahrig  PTA [facility]  Acute  Rehab Pager      [phone]

## 2014-11-19 NOTE — Consult Note (Signed)
Physical Medicine and Rehabilitation Consult Reason for Consult: Debilitation/sigmoid diverticulitis/perforated bowel Referring Physician: CCS   HPI: William [name] is a 64 y.o. right handed male former smoker admitted 10/27/2014 with diffuse abdominal pain worse in the left lower quadrant and low-grade fever. Patient independent prior to admission living with his wife. CT of abdomen and imaging revealed significant sigmoid diverticulitis with contained perforation. Underwent partial colectomy, colostomy with exploratory laparotomy and repair of umbilical hernia 10/28/2014 per Dr. Carolynne Edouardoth. Maintained on broad-spectrum antibiotics. Postoperative hypotension/ARDS with critical care medicine follow-up and aggressive ventilatory support failed extubation 11/02/2014 and ultimately required tracheostomy 11/08/2014 per Dr. Jenne PaneBates and has been off ventilator since 11/14/2014... Wound care nurse follow-up for colostomy care and education. TNA initiated for nutritional support and died advanced to a dysphagia 1 thin liquid. Bouts of confusion and agitation cranial CT scan negative for any acute changes. Subcutaneous Lovenox for DVT prophylaxis. Speech therapy follow-up currently with a #8 Shiley trach with PMV. Physical occupational therapy evaluations are completed with slow progress. M.D. has requested physical medicine rehabilitation consult.  Review of Systems  Constitutional: Positive for fever.  Gastrointestinal: Positive for nausea and abdominal pain.  Musculoskeletal: Positive for myalgias.  All other systems reviewed and are negative.  Past Medical History  Diagnosis Date  . Renal disorder     kidney stones  . Hypertension   . Thyroid disease    Past Surgical History  Procedure Laterality Date  . Partial colectomy N/A 10/28/2014    Procedure: PARTIAL COLECTOMY Sigmoid;  Surgeon: Chevis PrettyPaul Toth III, MD;  Location: [facility];  Service: General;  Laterality: N/A;  . Colostomy N/A 10/28/2014   Procedure: COLOSTOMY;  Surgeon: Chevis PrettyPaul Toth III, MD;  Location: [facility];  Service: General;  Laterality: N/A;  . Laparotomy N/A 10/28/2014    Procedure: EXPLORATORY LAPAROTOMY;  Surgeon: Chevis PrettyPaul Toth III, MD;  Location: [facility];  Service: General;  Laterality: N/A;  . Tracheostomy tube placement N/A 11/08/2014    Procedure: TRACHEOSTOMY;  Surgeon: Christia Readingwight Bates, MD;  Location: [facility];  Service: ENT;  Laterality: N/A;   History reviewed. No pertinent family history. Social History:  reports that he has quit smoking. He does not have any smokeless tobacco history on file. His alcohol and drug histories are not on file. Allergies:  Allergies  Allergen Reactions  . Aloprim [Allopurinol] Other (See Comments)    Unknown reaction  . Simvastatin Other (See Comments)    Unknown reaction   Medications Prior to Admission  Medication Sig Dispense Refill  . cholecalciferol (VITAMIN D) 1000 UNITS tablet Take 1,000 Units by mouth daily.    Marland Kitchen. levothyroxine (SYNTHROID, LEVOTHROID) 150 MCG tablet Take 150 mcg by mouth daily before breakfast.    . lisinopril (PRINIVIL,ZESTRIL) 10 MG tablet Take 10 mg by mouth daily.    . meloxicam (MOBIC) 15 MG tablet Take 15 mg by mouth daily.      Home: Home Living Family/patient expects to be discharged to:: Private residence Living Arrangements: Spouse/significant other Available Help at Discharge: Family, Available 24 hours/day Type of Home: House Home Access: Stairs to enter Entergy CorporationEntrance Stairs-Number of Steps: 3 Entrance Stairs-Rails: Right, Left, Can reach both Home Layout: One level Home Equipment: Bedside commode, Walker - standard, Cane - single point  Functional History: Prior Function Level of Independence: Independent Functional Status:  Mobility: Bed Mobility Overal bed mobility: +2 for physical assistance, Needs Assistance Bed Mobility: Rolling Rolling: +2 for physical assistance, Total assist General bed mobility comments: pt sitting  in  chair Transfers Overall transfer level: Needs assistance Equipment used: Rolling walker (2 wheeled) Transfers: Sit to/from Stand Sit to Stand: +2 safety/equipment, Mod assist General transfer comment: extra time  for rising from recliner, cues for hand placement and safety,  Ambulation/Gait Ambulation/Gait assistance: +2 safety/equipment Ambulation Distance (Feet): 5 Feet Assistive device: Rolling walker (2 wheeled) Gait Pattern/deviations: Step-to pattern, Shuffle General Gait Details: decreased  R foot clearance, walked 5' forward and backward, very shakey, decreased  stability going backward.    ADL: ADL Overall ADL's : Needs assistance/impaired Grooming: Sitting, Minimal assistance Lower Body Dressing: Sit to/from stand, Moderate assistance, +2 for physical assistance, +2 for safety/equipment, Cueing for sequencing, Cueing for safety  Cognition: Cognition Overall Cognitive Status: Within Functional Limits for tasks assessed Orientation Level: Oriented X4 Cognition Arousal/Alertness: Awake/alert Behavior During Therapy: WFL for tasks assessed/performed Overall Cognitive Status: Within Functional Limits for tasks assessed Area of Impairment: Safety/judgement, Awareness, Problem solving Following Commands: Follows one step commands inconsistently Safety/Judgement: Decreased awareness of safety, Decreased awareness of deficits General Comments: difficult  to assess, pt fairly alert but keeps eys closed most of time; mouths "I love you " to wife Difficult to assess due to: Tracheostomy  Blood pressure 129/77, pulse 89, temperature 98.4 F (36.9 C), temperature source Oral, resp. rate 18, height 5' 11.5" (1.816 m), weight 121.5 kg (267 lb 13.7 oz), SpO2 90 %. Physical Exam  Constitutional: He is oriented to person, place, and time.  64 year old right-handed Caucasian male in no acute distress  HENT:  Head: Normocephalic.  Eyes: EOM are normal.  Neck:  Tracheostomy tube #8 in  place with PMV  Cardiovascular: Normal rate and regular rhythm.   Respiratory:  Decreased breath sounds at the bases but clear to auscultation  GI: Soft. Bowel sounds are normal.  Abdomen is mildly distended. Surgical site with heavy bulky dressing as well as colostomy tube  Musculoskeletal: He exhibits edema (bilateral LE).  Neurological: He is alert and oriented to person, place, and time.  Good insight and awareness. UES: 4/5 deltoid,bicep, tricep, wrist and hands 4+. LES: 3/5 HF, 4/5 knees, 4/5 ankles.  Fairly good sitting balance  Psychiatric: He has a normal mood and affect. His behavior is normal. Judgment and thought content normal.    Results for orders placed or performed during the hospital encounter of 10/27/14 (from the past 24 hour(s))  Glucose, capillary     Status: Abnormal   Collection Time: 11/18/14  2:06 PM  Result Value Ref Range   Glucose-Capillary 169 (H) 70 - 99 mg/dL  Glucose, capillary     Status: None   Collection Time: 11/18/14  4:16 PM  Result Value Ref Range   Glucose-Capillary 83 70 - 99 mg/dL   Comment 1 Notify RN   Glucose, capillary     Status: Abnormal   Collection Time: 11/19/14  7:21 AM  Result Value Ref Range   Glucose-Capillary 111 (H) 70 - 99 mg/dL   No results found.  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 Does the need for close, 24 hr/day medical supervision in concert with the patient's rehab needs make it unreasonable for this patient to be served in a less intensive setting? Yes 2. Co-Morbidities requiring supervision/potential complications: trach, acute respiratory failure, s/p ex lap, morbid obesity 3. Due to bladder management, bowel management, safety, skin/wound care, disease management, medication administration, pain management and patient education, does the patient require 24 hr/day rehab nursing? Yes 4. Does the patient require coordinated care  of a physician, rehab  nurse, PT (1-2 hrs/day, 5 days/week), OT (1-2 hrs/day, 5 days/week) and SLP (1-2 hrs/day, 5 days/week) to address physical and functional deficits in the context of the above medical diagnosis(es)? Yes Addressing deficits in the following areas: balance, endurance, locomotion, strength, transferring, bowel/bladder control, bathing, dressing, feeding, grooming, toileting, speech, swallowing and psychosocial support 5. Can the patient actively participate in an intensive therapy program of at least 3 hrs of therapy per day at least 5 days per week? Yes 6. The potential for patient to make measurable gains while on inpatient rehab is excellent 7. Anticipated functional outcomes upon discharge from inpatient rehab are modified independent and supervision  with PT, modified independent and supervision with OT, modified independent and supervision with SLP. 8. Estimated rehab length of stay to reach the above functional goals is: 12-17 days 9. Does the patient have adequate social supports and living environment to accommodate these discharge functional goals? Yes 10. Anticipated D/C setting: Home 11. Anticipated post D/C treatments: HH therapy 12. Overall Rehab/Functional Prognosis: excellent  RECOMMENDATIONS: This patient's condition is appropriate for continued rehabilitative care in the following setting: CIR Patient has agreed to participate in recommended program. Yes Note that insurance prior authorization may be required for reimbursement for recommended care.  Comment: Rehab Admissions Coordinator to follow up.  Thanks,  Ranelle Oyster, MD, [place] [facility]     11/19/2014

## 2014-11-19 NOTE — Progress Notes (Signed)
PULMONARY / CRITICAL CARE MEDICINE   Name: William OuJoseph Bloch MRN: [id number] DOB: 08/23/1951    ADMISSION DATE:  10/27/2014  CHIEF COMPLAINT:  Perforated sigmoid colon  BRIEF PATIENT DESCRIPTION:   64 y/o male former smoker presented with abd pain from perforated sigmoid colon taken to OR emergently.  Remained on vent post op.  Failed extubation 1/16, reintubated. Course complicated by vent dependence requiring trach , GNR bacteremia    SIGNIFICANT EVENTS:  1/10  Admit, Partial colectomy  1/13  Off pressors 1/14  On TNA 1/16  Extubated >> reintubated in PM 1/19  Agitation, hypertension overnight, sedation changed to propofol. 60% / 8 1/20 PEEP weaned to 5, FiO2 40%, diuresing 1/22 more hypoxemic, febrile, plan tracheostomy 1/24 rash- benadryl - zosyn stopped 1/27 agitation >> precedex 1/28 off vent since 11a  STUDIES: 1/10  CT abd/pelvis >> sigmoid colon diverticulitis with perforation, umbilical hernia, b/l renal stones, 8 mm RLL nodule 1/11  ECHO >> EF 55-60%, nml systolic fxn, PA peak 32   1/17  CT Head >> no acute intracranial abnormality, ? Small vessel ischemic changes, L maxillary sinus disease 1/25 CT abd >> collapse/consolidation in both lower lobes, Possible small abscesses in the left paracolic gutter with residual phlegmon in the anterior left anatomic pelvis, at the site of previously seen diverticular abscess. 11/16/14: TC continues, cuff went down, spoke with PMV   SUBJECTIVE/OVERNIGHT/INTERVAL HX  11/19/2014: 8 shiley trach with PMV - talking , mobilizing. Speech reportedly okayed advancing to puree diet. Possible inpatient rehab. No track secretions. Wife reports hx of OSA under care of Dr Dennard SchaumannVamsee Paruchuri v Dr Laurence Alyyan [facility] [phone] and needing his CPAP settings.    VITAL SIGNS: Temp:  [98.4 F (36.9 C)-99.1 F (37.3 C)] 98.4 F (36.9 C) (02/02 0546) Pulse Rate:  [84-104] 87 (02/02 0546) Resp:  [18-24] 18 (02/02 0546) BP: (104-133)/(62-86)  122/77 mmHg (02/02 0546) SpO2:  [90 %-96 %] 90 % (02/02 0546) FiO2 (%):  [28 %] 28 % (02/02 0315)   VENTILATOR SETTINGS: Vent Mode:  [-]  FiO2 (%):  [28 %] 28 %   INTAKE / OUTPUT: Intake/Output      02/01 0701 - 02/02 0700 02/02 0701 - 02/03 0700   P.O. 420 240   I.V. (mL/kg) 1180 (9.7) 20 (0.2)   IV Piggyback     Total Intake(mL/kg) 1600 (13.2) 260 (2.1)   Urine (mL/kg/hr) 1450 (0.5) 250 (0.8)   Stool 1025 (0.4)    Total Output 2475 250   Net -875 +10          PHYSICAL EXAMINATION:  Gen: no distress HEENT: NCAT EOMi, trach clean PULM: CTA bialterally. No distress CV: s1 s2 RR distant AB: low bowel sounds, midline scar, wound vac Derm: macular erythematous rash over trunk & BL thighs -less erythema, improved Ext: SCD Neuro: awake, RASS 0, follows commands, interacting well  LABS:  CBC  Recent Labs Lab 11/13/14 0620 11/15/14 0413 11/18/14 0430  WBC 9.4 12.7* 12.0*  HGB 11.8* 12.4* 12.2*  HCT 35.3* 37.1* 37.9*  PLT 375 450* 432*   BMET  Recent Labs Lab 11/16/14 0500 11/17/14 0457 11/18/14 0430  NA 148* 141 139  K 3.6 3.5 3.7  CL 112 107 104  CO2 28 27 26   BUN 34* 32* 26*  CREATININE 0.87 0.75 0.84  GLUCOSE 145* 148* 105*   Electrolytes  Recent Labs Lab 11/14/14 0422  11/16/14 0500 11/17/14 0457 11/18/14 0430  CALCIUM 9.1  < >  8.9 8.8 8.8  MG 2.2  --   --  2.1  --   PHOS 3.7  --   --  3.1  --   < > = values in this interval not displayed. Sepsis Markers No results for input(s): LATICACIDVEN, PROCALCITON, O2SATVEN in the last 168 hours. ABG No results for input(s): PHART, PCO2ART, PO2ART in the last 168 hours. Liver Enzymes  Recent Labs Lab 11/14/14 0422 11/18/14 0430  AST 26 22  ALT 38 29  ALKPHOS 78 61  BILITOT 0.5 0.4  ALBUMIN 2.8* 2.9*   Glucose  Recent Labs Lab 11/17/14 2347 11/18/14 0320 11/18/14 0808 11/18/14 1406 11/18/14 1616 11/19/14 0721  GLUCAP 135* 93 105* 169* 83 111*    Imaging No results  found.  ASSESSMENT / PLAN:  PULMONARY ETT 1/10 >> 1/16, 1/16 >> 1/22, Trach (Dr Jenne Pane) 1/22 >>  A: Acute respiratory failure with hypoxemia, worse 1/21> suspect HCAP, +/- pulm edema (net even hospitalization) Prolonged Vent course> tracheostomy 1/22 Hx of tobacco abuse with 8 mm RLL nodule on CT abd/pelvis - will need outpt FU Hx of OSA   - Tolerating trach well. No issues. Question if patient can slowly be decannulated. Has hx of OSA and trach can be curative  P:   Left message with sleep specialist to call back - regarding leaving trach in for a few months atleast Ct ATC for now with PPMV Continue guaifenesin, pulm toilette PMV attempt with cuff down, maximize daily, never at night Mobilize, upright    Other issues per CCS   Dr. Kalman Shan, M.D., [facility].C.P Pulmonary and Critical Care Medicine Staff Physician [facility] Pager: [phone], If no answer or between  15:00h - 7:00h: call [phone]  11/19/2014 9:45 AM

## 2014-11-19 NOTE — Progress Notes (Signed)
OT Cancellation Note  Patient Details Name: William [name] MRN: [id number] DOB: 09/13/1951   Cancelled Treatment:    Reason Eval/Treat Not Completed: Other (comment).  Attempted tx twice.  Family was assisting with bathing and asked OT to return later and now pt is working with PT.  Will try another day.  William [name] 11/19/2014, 2:18 PM  William [name], OTR/L [phone] 11/19/2014

## 2014-11-19 NOTE — Progress Notes (Signed)
11 Days Post-Op  Subjective: He looks good and is up to pureed diet. Dr Marchelle Gearingamaswamy is working with his sleep apnea MD to coordinate Trach care, and sleep apnea.  He has almost finished his antibiotics.  His wound is looking much better.  Objective: Vital signs in last 24 hours: Temp:  [98.4 F (36.9 C)-99.1 F (37.3 C)] 98.4 F (36.9 C) (02/02 0546) Pulse Rate:  [84-104] 87 (02/02 0546) Resp:  [18-24] 18 (02/02 0546) BP: (104-133)/(62-86) 122/77 mmHg (02/02 0546) SpO2:  [90 %-96 %] 90 % (02/02 0546) FiO2 (%):  [28 %] 28 % (02/02 0315) Last BM Date: 11/16/14 420 PO  Intake/Output from previous day: 02/01 0701 - 02/02 0700 In: 1600 [P.O.:420; I.V.:1180] Out: 2475 [Urine:1450; Stool:1025] Intake/Output this shift: Total I/O In: 240 [P.O.:240] Out: 250 [Urine:250]  General appearance: alert, cooperative and no distress Resp: clear to auscultation bilaterally and breathing comfortablly with #8 trach GI: open wound has some yellow fibrous stuff, but over all looks clean and healing in well. tolerting diet and ostomy working.  Lab Results:   Recent Labs  11/18/14 0430  WBC 12.0*  HGB 12.2*  HCT 37.9*  PLT 432*    BMET  Recent Labs  11/17/14 0457 11/18/14 0430  NA 141 139  K 3.5 3.7  CL 107 104  CO2 27 26  GLUCOSE 148* 105*  BUN 32* 26*  CREATININE 0.75 0.84  CALCIUM 8.8 8.8   PT/INR No results for input(s): LABPROT, INR in the last 72 hours.   Recent Labs Lab 11/14/14 0422 11/18/14 0430  AST 26 22  ALT 38 29  ALKPHOS 78 61  BILITOT 0.5 0.4  PROT 6.4 6.3  ALBUMIN 2.8* 2.9*     Lipase     Component Value Date/Time   LIPASE 23 10/27/2014 1810     Studies/Results: No results found.  Medications: . antiseptic oral rinse  7 mL Mouth Rinse QID  . bacitracin   Topical BID  . chlorhexidine  15 mL Mouth Rinse BID  . collagenase   Topical BID  . enoxaparin (LOVENOX) injection  40 mg Subcutaneous Daily  . feeding supplement (ENSURE COMPLETE)  237 mL  Oral BID BM  . fentaNYL  25 mcg Intravenous Once  . furosemide  20 mg Intravenous Daily  . guaiFENesin  10 mL Oral BID  . insulin aspart  0-20 Units Subcutaneous TID WC  . insulin glargine  15 Units Subcutaneous Daily  . levofloxacin (LEVAQUIN) IV  750 mg Intravenous Q24H  . levothyroxine  150 mcg Oral QAC breakfast  . lip balm  1 application Topical BID  . metoprolol tartrate  25 mg Oral BID  . metronidazole  500 mg Intravenous Q8H  . nystatin   Topical BID  . pantoprazole sodium  40 mg Oral Q1200  . sodium chloride  10-40 mL Intracatheter Q12H    Assessment/Plan 1. Sigmoid diverticulitis, Perforated Bowel EXPLORATORY LAPAROTOMY, PARTIAL COLECTOMY Sigmoid, COLOSTOMY,  Repair umbilical hernia, 10/28/2014, Chevis PrettyPaul Toth III, MD.  2. Sepsis with hypotension/Bacteroides bacteremia 11/08/14 (7 days of Levofloxacin/metrnidazole) - this should be complete after 11/21/14. 3.Ventilator dependant respiratory failure/EXTUBATED/RE intubated 11/02/14, /tracheostomy1/22/16/ resolving pneumonia/ off Vent  4. Hx of hypertension/hypotensio after surgery, previously requiring pressors 5. Hypothyroid TSH 16 6. Hx of tobacco use 30 years (quit 9 years ago) 7. Body mass index is 38.51 kg  8. Hx of nephrolithiasis 9. Lovenox for DVT prophylaxis 10. Malnutrition- Now on pureed diet.   Plan:  Working on transfer to ip  rehab at [facility], use trach longer to help with sleep apnea and weight issue. Wet to dry dressing to abdomen.    LOS: 23 days    Inscoe,Tanaiya Kolarik 11/19/2014

## 2014-11-19 NOTE — Progress Notes (Signed)
Inpatient Rehabilitation  I note that Dr. Riley KillSwartz has completed the consult and is recommending IP Rehab.  I will see pt. In the am to discuss post acute IP Rehab.    Weldon PickingSusan Ignace Mandigo PT Inpatient Rehab Admissions Coordinator Cell [phone] Office [phone]

## 2014-11-20 ENCOUNTER — Encounter (HOSPITAL_COMMUNITY): Payer: Self-pay | Admitting: General Surgery

## 2014-11-20 DIAGNOSIS — K651 Peritoneal abscess: Secondary | ICD-10-CM | POA: Insufficient documentation

## 2014-11-20 DIAGNOSIS — E46 Unspecified protein-calorie malnutrition: Secondary | ICD-10-CM

## 2014-11-20 DIAGNOSIS — I10 Essential (primary) hypertension: Secondary | ICD-10-CM

## 2014-11-20 DIAGNOSIS — E039 Hypothyroidism, unspecified: Secondary | ICD-10-CM

## 2014-11-20 HISTORY — DX: Hypothyroidism, unspecified: E03.9

## 2014-11-20 HISTORY — DX: Unspecified protein-calorie malnutrition: E46

## 2014-11-20 HISTORY — DX: Essential (primary) hypertension: I10

## 2014-11-20 LAB — GLUCOSE, CAPILLARY
Glucose-Capillary: 101 mg/dL — ABNORMAL HIGH (ref 70–99)
Glucose-Capillary: 107 mg/dL — ABNORMAL HIGH (ref 70–99)
Glucose-Capillary: 100 mg/dL — ABNORMAL HIGH (ref 70–99)
Glucose-Capillary: 124 mg/dL — ABNORMAL HIGH (ref 70–99)

## 2014-11-20 MED ORDER — ENSURE COMPLETE PO LIQD: 237.0000 mL | Freq: Two times a day (BID) | ORAL | Status: DC

## 2014-11-20 MED ORDER — METOPROLOL TARTRATE 25 MG PO TABS: 25.0000 mg | ORAL_TABLET | Freq: Two times a day (BID) | ORAL | Status: DC

## 2014-11-20 MED ORDER — METRONIDAZOLE IN NACL 5-0.79 MG/ML-% IV SOLN: 500.0000 mg | Freq: Three times a day (TID) | INTRAVENOUS | Status: DC

## 2014-11-20 MED ORDER — COLLAGENASE 250 UNIT/GM EX OINT
TOPICAL_OINTMENT | Freq: Two times a day (BID) | CUTANEOUS | Status: DC
Start: 2014-11-20 — End: 2014-11-28

## 2014-11-20 MED ORDER — PANTOPRAZOLE SODIUM 40 MG PO PACK: 40.0000 mg | PACK | Freq: Every day | ORAL | Status: DC

## 2014-11-20 MED ORDER — GUAIFENESIN 100 MG/5ML PO SOLN: 10.0000 mL | Freq: Two times a day (BID) | ORAL | Status: DC

## 2014-11-20 MED ORDER — LEVOFLOXACIN IN D5W 750 MG/150ML IV SOLN: 750.0000 mg | INTRAVENOUS | Status: DC

## 2014-11-20 MED ORDER — SACCHAROMYCES BOULARDII 250 MG PO CAPS: 250.0000 mg | ORAL_CAPSULE | Freq: Two times a day (BID) | ORAL | Status: DC

## 2014-11-20 MED ORDER — ACETAMINOPHEN 160 MG/5ML PO SOLN: 650.0000 mg | ORAL | Status: DC | PRN

## 2014-11-20 NOTE — Progress Notes (Signed)
12 Days Post-Op  Subjective: Setting up breakfast and awaiting transfer to rehab.  Agree with plans for transfer.   Objective: Vital signs in last 24 hours: Temp:  [98.2 F (36.8 C)-98.9 F (37.2 C)] 98.9 F (37.2 C) (02/03 0555) Pulse Rate:  [88-115] 107 (02/03 0555) Resp:  [16-20] 16 (02/03 0555) BP: (98-149)/(60-83) 98/60 mmHg (02/03 0555) SpO2:  [94 %-97 %] 94 % (02/03 0745) FiO2 (%):  [28 %] 28 % (02/03 0309) Weight:  [118.888 kg (262 lb 1.6 oz)] 118.888 kg (262 lb 1.6 oz) (02/03 0134) Last BM Date: 11/19/14 960 PO Colostomy working Afebrile, VSS Labs OK 2 days ago, WBC up slightly then. Intake/Output from previous day: 02/02 0701 - 02/03 0700 In: 2955 [P.O.:960; I.V.:1195; IV Piggyback:800] Out: 2050 [Urine:1850; Stool:200] Intake/Output this shift:    General appearance: alert, cooperative and no distress Resp: clear to auscultation bilaterally GI: I will look at abdomen later when he is in bed.  Lab Results:   Recent Labs  11/18/14 0430  WBC 12.0*  HGB 12.2*  HCT 37.9*  PLT 432*    BMET  Recent Labs  11/18/14 0430  NA 139  K 3.7  CL 104  CO2 26  GLUCOSE 105*  William [name] 26*  CREATININE 0.84  CALCIUM 8.8   PT/INR No results for input(s): LABPROT, INR in the last 72 hours.   Recent Labs Lab 11/14/14 0422 11/18/14 0430  AST 26 22  ALT 38 29  ALKPHOS 78 61  BILITOT 0.5 0.4  PROT 6.4 6.3  ALBUMIN 2.8* 2.9*     Lipase     Component Value Date/Time   LIPASE 23 10/27/2014 1810     Studies/Results: No results found.  Medications: . antiseptic oral rinse  7 mL Mouth Rinse QID  . bacitracin   Topical BID  . chlorhexidine  15 mL Mouth Rinse BID  . collagenase   Topical BID  . enoxaparin (LOVENOX) injection  40 mg Subcutaneous Daily  . feeding supplement (ENSURE COMPLETE)  237 mL Oral BID BM  . fentaNYL  25 mcg Intravenous Once  . furosemide  20 mg Intravenous Daily  . guaiFENesin  10 mL Oral BID  . insulin aspart  0-20 Units  Subcutaneous TID WC  . insulin glargine  15 Units Subcutaneous Daily  . levofloxacin (LEVAQUIN) IV  750 mg Intravenous Q24H  . levothyroxine  150 mcg Oral QAC breakfast  . lip balm  1 application Topical BID  . metoprolol tartrate  25 mg Oral BID  . metronidazole  500 mg Intravenous Q8H  . nystatin   Topical BID  . pantoprazole sodium  40 mg Oral Q1200  . saccharomyces boulardii  250 mg Oral BID  . sodium chloride  10-40 mL Intracatheter Q12H    Assessment/Plan 1. Sigmoid diverticulitis, Perforated Bowel EXPLORATORY LAPAROTOMY, PARTIAL COLECTOMY Sigmoid, COLOSTOMY,  Repair umbilical hernia, 10/28/2014, Chevis PrettyPaul Toth III, MD.  2. Sepsis with hypotension/Bacteroides bacteremia 11/08/14 (7 days of Levofloxacin/metrnidazole) - this should be complete after 11/21/14. 3.Ventilator dependant respiratory failure/EXTUBATED/RE intubated 11/02/14, /tracheostomy1/22/16/ resolving pneumonia/ off Vent  4. Hx of hypertension/hypotensio after surgery, previously requiring pressors 5. Hypothyroid TSH 16 6. Hx of tobacco use 30 years (quit 9 years ago) 7. Body mass index is 38.51 kg  8. Hx of nephrolithiasis 9. Lovenox for DVT prophylaxis 10. Malnutrition- Now on pureed diet.   Plan:  Appreciated Dr. Jane Canaryamaswamy's assistance. We agree with IP rehab, and will get him ready for transfer upon final approval.  He is  doing very well at this point.    LOS: 24 days    William [name],William [name] 11/20/2014

## 2014-11-20 NOTE — Progress Notes (Signed)
Physical Therapy Treatment Patient Details Name: Johnney OuJoseph Littler MRN: [id number] DOB: 10/31/1950 Today's Date: 11/20/2014    History of Present Illness [age] M with HTN, nephrolithiasis who [facility] with abdominal pain secondary to a perforated sigmoid. Taken emergently to OR 1/10 for partial colectomy, VDRF, currently with  tracheostomy     PT Comments    Pt feeling better.  Tolerated increased amb distance.  Progressing slowly but steady due to length of stay.    Follow Up Recommendations  CIR     Equipment Recommendations       Recommendations for Other Services       Precautions / Restrictions Precautions Precautions: Fall Restrictions Weight Bearing Restrictions: No    Mobility  Bed Mobility               General bed mobility comments: pt in chair  Transfers Overall transfer level: Needs assistance Equipment used: Rolling walker (2 wheeled) Transfers: Sit to/from Stand Sit to Stand: Min assist;Mod assist         General transfer comment: increased time and VC needed.  pt uses momentum and forward lean with sit to stand.  Ambulation/Gait Ambulation/Gait assistance: Min assist;+2 physical assistance Ambulation Distance (Feet): 85 Feet (x 2 one sitting rest break) Assistive device: Rolling walker (2 wheeled) Gait Pattern/deviations: Step-to pattern;Step-through pattern Gait velocity: decreased   General Gait Details: had recliner follow for safety and to be able to "walk the distance".  Tolerated increased amb distance.  still unsteady/shaky.   Stairs            Wheelchair Mobility    Modified Rankin (Stroke Patients Only)       Balance                                    Cognition                            Exercises      General Comments        Pertinent Vitals/Pain Pain Assessment: No/denies pain    Home Living                      Prior Function            PT Goals  (current goals can now be found in the care plan section) Progress towards PT goals: Progressing toward goals    Frequency  Min 3X/week    PT Plan      Co-evaluation             End of Session Equipment Utilized During Treatment: Gait belt Activity Tolerance: Patient tolerated treatment well Patient left: in chair;with call bell/phone within reach;with family/visitor present     Time: 9629-52841359-1428 PT Time Calculation (min) (ACUTE ONLY): 29 min  Charges:  $Gait Training: 8-22 mins $Therapeutic Activity: 8-22 mins                    G Codes:      Felecia ShellingLori Jeffory Snelgrove  PTA [facility]  Acute  Rehab Pager      [phone]

## 2014-11-20 NOTE — Procedures (Signed)
Tracheostomy tube change: Informed verbal consent was obtained after explaining the risks (including bleeding and infection), benefits and alternatives of the procedure. Verbal timeout was performed prior to the procedure. The old  # 8 cuffed trach was carefully removed. the tracheostomy site appeared: unremarkable A new #  6 Cuffless trach was easily placed in the tracheostomy stoma and secured with velcro trach ties. The tracheostomy was patent, good color change observed via EZ-CAP, and the patient was easily able to voice with finger occlusion and tolerated the procedure well with no immediate complications.   He tolerated this well.  Would continue day-time capping and night time ATC. If/when wife can bring in his CPAP machine we can cap 24/7 and be sure he tolerates his CPAP. If he does well with this he could potentially be decannulated before d/c from Rehab.   William [name] ACNP-BC [facility]/Critical Care Pager # [phone] OR # [phone] if no answer

## 2014-11-20 NOTE — Discharge Summary (Signed)
Physician Discharge Summary  Patient ID: William [name] MRN: [id number] DOB/AGE: 62-Feb-1952 62 y.o.  Admit date: 10/27/2014 Discharge date: 11/21/2014  Admission Diagnoses:  Sigmoid diverticulitis with perforation Hx of nephrolithiasis Hx of hypertension Hx of thyroid disease  Discharge Diagnoses:  1. Sigmoid diverticulitis, Perforated Bowel 2. Sepsis with hypotension/requiring pressors post op 3.  Bacteroides bacteremia from left maxillary sinus disease/rescue cooling for temp 103, 11/08/14  4.Ventilator dependant respiratory failure/EXTUBATED/RE intubated 11/02/14, /tracheostomy1/22/16/ resolving pneumonia/ off Vent 11/16/14. 5.  OSA on CPAP at home. 6.Hx of tobacco use 30 years (quit 9 years ago) 7.  Hx of hypertension/ 8. Hypothyroid TSH 16 9. Malnutrition- Now on pureed diet. 10. Poor healing of William abdominal wound; opened on 11/13/14 11. Body mass index is 38.51 kg  12. Hx of nephrolithiasis     Principal Problem:   Diverticulitis of colon with perforation s/p colectomy/colostomy 10/28/2014 Active Problems:   Shock circulatory   Acute respiratory failure with hypoxia   Abscess of suppurative peritonitis   Bacteroides fragilis infection   Tracheostomy, acute management   Acute on chronic respiratory failure   Sleep apnea   Obesity   Essential hypertension   Protein-calorie malnutrition   Solitary pulmonary nodule on lung CT   Normocytic anemia   Hypothyroidism   Fever   Rash   Acute respiratory failure   Tracheostomy status   PROCEDURES:  1.  EXPLORATORY LAPAROTOMY, PARTIAL COLECTOMY Sigmoid, COLOSTOMY,  Repair umbilical hernia, 10/28/2014, [name], MD.  2.  Tracheostomy 11/08/14 Dr. Kathi Der Course: William [name] is a 63yo wm who presents with abdominal pain that started this am. William [name] states it felt like his typical kidney stone pain. Pain worsened during William day. William [name] has run a fever. CT shows sigmoid diverticulitis with well formed abscess  anterior to sigmoid colon but no free air.  William [name] was admitted by Dr. Carolynne Edouard and taken to William OR in William early AM.  With William above procedure.  William [name] was returned to William ICU and maintained on William Vent.  A central line was placed, CCM assisted with his care from William time William [name] was admitted to William ICU.  Early post op William [name] required intubation with sedation,  William [name] had hypotension and this required pressors.  His urine output was below normal. William [name] had sepsis with shock and was treated as such.  William [name] had sleep apnea, and a history of tobacco use; now a decreased lung capacity post op.  His wound was rather bloody with William first dressing changes.  William ostomy was edematous.  A negative pressure wound vac was placed on 10/28/14 by Dr. Michaell Cowing.  William [name] went thru several attempts to wean off William Ventilator which failed.  His shock and hypotension resolved.  William [name] was placed on TNA for nutritional support.  William [name] had a difficult post op course with William above noted issues.  William [name] was finally weaned off William ventilator on 11/16/14.  We converted his TNA to tube feedings when his ostomy started to work.  William [name] developed a significant bacteremia and this has been treated by ID, William [name] will finish William course on 11/21/14.  William [name] had his tracheostomy converted to a PM and underwent swallowing evaluations.  William [name] has been repeatedly evaluated by Speech, and William [name] is up to a pureed diet as of 11/19/14.  It has been Dr. Jane Canary recommendation William [name] keep William trach to help with his sleep apnea, and allow him to lose some weight while recovering.  Dr. Marchelle Gearing has called his  sleep apnea physician to help with coordination of this. His open abdominal wound is being packed wet to dry.  William [name] has loose sutures at William base, but William fascia is intact.  William [name] has some fibrinous exudate in William open portion, but it is really very clean.  I wound not do any debridement at this point.  It should heal in nicely with increased PO intake and improved nutrition. William [name] is currently being evaluated for IP rehab and we  are anticipating transfer to Rehab soon. William [name] will need a SLP evaluation to advance his diet from D1 to solids  Condition on d/c:  Improved  CBC Latest Ref Rng 11/18/2014 11/15/2014 11/13/2014  WBC 4.0 - 10.5 K/uL 12.0(H) 12.7(H) 9.4  Hemoglobin 13.0 - 17.0 g/dL 12.2(L) 12.4(L) 11.8(L)  Hematocrit 39.0 - 52.0 % 37.9(L) 37.1(L) 35.3(L)  Platelets 150 - 400 K/uL 432(H) 450(H) 375   CMP Latest Ref Rng 11/18/2014 11/17/2014 11/16/2014  Glucose 70 - 99 mg/dL 161(W) 960(A) 540(J)  BUN 6 - 23 mg/dL 81(X) 91(Y) 78(G)  Creatinine 0.50 - 1.35 mg/dL 9.56 2.13 0.86  Sodium 135 - 145 mmol/L 139 141 148(H)  Potassium 3.5 - 5.1 mmol/L 3.7 3.5 3.6  Chloride 96 - 112 mmol/L 104 107 112  CO2 19 - 32 mmol/L Calcium 8.4 - 10.5 mg/dL 8.8 8.8 8.9  Total Protein 6.0 - 8.3 g/dL 6.3 - -  Total Bilirubin 0.3 - 1.2 mg/dL 0.4 - -  Alkaline Phos 39 - 117 U/L 61 - -  AST 0 - 37 U/L 22 - -  ALT 0 - 53 U/L 29 - -        Disposition: Final discharge disposition not confirmed     Medication List    STOP taking these medications        lisinopril 10 MG tablet  Commonly known as:  PRINIVIL,ZESTRIL     meloxicam 15 MG tablet  Commonly known as:  MOBIC      TAKE these medications        acetaminophen 160 MG/5ML solution  Commonly known as:  TYLENOL  Place 20.3 mLs (650 mg total) into feeding tube every 4 (four) hours as needed for mild pain, headache or fever.     cholecalciferol 1000 UNITS tablet  Commonly known as:  VITAMIN D  Take 1,000 Units by mouth daily.     collagenase ointment  Commonly known as:  SANTYL  Apply topically 2 (two) times daily.     feeding supplement (ENSURE COMPLETE) Liqd  Take 237 mLs by mouth 2 (two) times daily between meals.     guaiFENesin 100 MG/5ML Soln  Commonly known as:  ROBITUSSIN  Take 10 mLs (200 mg total) by mouth 2 (two) times daily.     levofloxacin 750 MG/150ML Soln  Commonly known as:  LEVAQUIN  Inject 150 mLs (750 mg total) into William vein  daily.     levothyroxine 150 MCG tablet  Commonly known as:  SYNTHROID, LEVOTHROID  Take 150 mcg by mouth daily before breakfast.     metoprolol tartrate 25 MG tablet  Commonly known as:  LOPRESSOR  Take 1 tablet (25 mg total) by mouth 2 (two) times daily.     metroNIDAZOLE 5-0.79 MG/ML-% IVPB  Commonly known as:  FLAGYL  Inject 100 mLs (500 mg total) into William vein every 8 (eight) hours.     pantoprazole sodium 40 mg/20 mL Pack  Commonly known as:  PROTONIX  Take 20 mLs (  40 mg total) by mouth daily at 12 noon.     saccharomyces boulardii 250 MG capsule  Commonly known as:  FLORASTOR  Take 1 capsule (250 mg total) by mouth 2 (two) times daily.       Follow-up Information    Follow up with Robyne AskewTH III,PAUL S, MD. Schedule an appointment as soon as possible for a visit in 2 weeks.   Specialty:  General Surgery   Why:  for 2 weeks after discharge from Rehab, sooner if you have an issue.   Contact information:   [address] [place] [address] [phone]       Follow up with Call your Sleep apnea doctor for follow up.      Follow up with contact your primary care before discharge from rehab and arrange follow up..      SignedSherrie George: Bayman,WILLARD 11/20/2014, 12:35 PM   Amended by Ashok NorrisEmina Emre Stock, ANP-BC 11/21/14 at 0900

## 2014-11-20 NOTE — Progress Notes (Signed)
Inpatient Rehabilitation  I met with William [name] and William [name] (wife) William [name] at the bedside to discuss post acute IP Rehab.  Pt. and wife very interested in having pt. come to rehab as pt. wants to return to full independence as soon as possible, so he can return to work.   I educated pt. and wife on the IP Rehab program and what to expect if he is approved by his insurance company.  I will submit for insurance authorization and will update the team as soon as they give a decision.  I discussed  with Dr. Chase Caller.  He plans to downsize trach to #6 today. I updated RN and SW.  Please call if questions.    Karene Fry will follow up tomorrow in my absence and can be reached at [phone].    [place] Admissions Coordinator Cell [phone] Office [phone]

## 2014-11-20 NOTE — PMR Pre-admission (Signed)
PMR Admission Coordinator Pre-Admission Assessment  Patient: William [name] is an 64 y.o., male MRN: [id number] DOB: 06/13/1951 Height: 5' 11"  (180.3 cm) Weight: 118.3 kg (260 lb 12.9 oz)              Insurance Information HMO:     PPO:       PCP:       IPA:       80/20:       OTHER:  Group # [id number] PRIMARY: UHC     Policy#:  [id number]      Subscriber:  self CM Name:  Geryl Rankins      Phone#:  [phone]     Fax#:   Pre-Cert#: [id number]      Employer: Self employed, [employer] care plan Benefits:  Phone #: [phone]     Name: Reine Just. Date: 10/18/14     Deduct: $800 (met all)      Out of Pocket Max: $1600 (met $1550.85)      Life Max: Unlimited CIR: 80% with 60 day limit      SNF: 80% with 60 day limit combined with inpatient rehab Outpatient: 23 PT visits and 30 ST visits per year     Co-Pay: $20/visit Home Health: 80% with 60 visits limit      Co-Pay: 20% DME: 80%     Co-Pay: 20% Providers: in network only.  Need referrals to go to a specialist.  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 right handed male former smoker/OSA admitted 10/27/2014 with diffuse abdominal pain worse in the left lower quadrant and low-grade fever. Patient independent prior to admission living with his wife. CT of chest/ abdomen and imaging revealed significant sigmoid diverticulitis with contained perforation as well as incidental findings of 8 mm right lower lobe nodule. Underwent partial colectomy, colostomy with exploratory laparotomy and repair of umbilical hernia 26/20/3559 per Dr. Marlou Starks. Maintained on broad-spectrum antibiotics and transitioned to intravenous Levaquin until 11/21/2014 and stopped. Postoperative hypotension/ARDS/healthcare associated  pneumonia with critical care medicine follow-up and aggressive ventilatory support failed extubation 11/02/2014 and ultimately required tracheostomy 11/08/2014 per Dr. Redmond Baseman and has been off ventilator since 11/14/2014. Patient completed course of Levaquin and metronidazole 11/21/2014 for healthcare associated pneumonia and followed by infectious disease... Wound care nurse follow-up for colostomy care and education. TNA initiated for nutritional support and died advanced to a dysphagia 1 thin liquid. Bouts of confusion and agitation cranial CT scan negative for any acute changes. Subcutaneous Lovenox for DVT prophylaxis. Speech therapy follow-up currently with a #6 cuffless Shiley trach.  Physical and occupational therapy evaluations are completed with slow progress. M.D. has requested physical medicine rehabilitation consult. Patient to be admitted for comprehensive inpatient rehabilitation program.  Past Medical History  Past Medical History  Diagnosis Date  . Renal disorder     kidney stones  . Hypertension   . Thyroid disease   . Hypothyroidism 11/20/2014  . Essential hypertension 11/20/2014  . Protein-calorie malnutrition 11/20/2014    Family History  family history is not on file.  Prior Rehab/Hospitalizations: pt. denies   Current Medications   Current facility-administered medications:  .  acetaminophen (TYLENOL) solution 650 mg, 650 mg, Per Tube, Q4H PRN, Collene Gobble, MD, 650 mg at 11/14/14 1428 .  acetaminophen (TYLENOL) suppository  650 mg, 650 mg, Rectal, Q6H PRN, Michael Boston, MD, 650 mg at 11/08/14 2030 .  antiseptic oral rinse (CPC / CETYLPYRIDINIUM CHLORIDE 0.05%) solution 7 mL, 7 mL, Mouth Rinse, QID, Raylene Miyamoto, MD, 7 mL at 11/21/14 0528 .  bacitracin ointment, , Topical, BID, Kara Mead, [place] .  chlorhexidine (PERIDEX) 0.12 % solution 15 mL, 15 mL, Mouth Rinse, BID, Raylene Miyamoto, MD, 15 mL at 11/21/14 0755 .  collagenase (SANTYL) ointment, , Topical,  BID, Michael Boston, MD, 1 application at 95/62/13 2226 .  dextrose 5 % solution, , Intravenous, Continuous, Raylene Miyamoto, MD, Last Rate: 50 mL/hr at 11/20/14 1708, 50 mL at 11/20/14 1708 .  diphenhydrAMINE (BENADRYL) injection 25 mg, 25 mg, Intravenous, Q8H PRN, Donita Brooks, NP, 25 mg at 11/14/14 0347 .  enoxaparin (LOVENOX) injection 40 mg, 40 mg, Subcutaneous, Daily, Kara Mead V, MD, 40 mg at 11/20/14 1013 .  feeding supplement (ENSURE COMPLETE) (ENSURE COMPLETE) liquid 237 mL, 237 mL, Oral, BID BM, Dorann Ou, RD, 237 mL at 11/20/14 1420 .  fentaNYL (SUBLIMAZE) injection 25 mcg, 25 mcg, Intravenous, Once, Earnstine Regal, PA-C .  fentaNYL (SUBLIMAZE) injection 50-100 mcg, 50-100 mcg, Intravenous, Q2H PRN, Chesley Mires, MD, 100 mcg at 11/17/14 0550 .  furosemide (LASIX) injection 20 mg, 20 mg, Intravenous, Daily, Raylene Miyamoto, MD, 20 mg at 11/20/14 1013 .  guaiFENesin (ROBITUSSIN) 100 MG/5ML solution 200 mg, 10 mL, Oral, BID, Juanito Doom, MD, 200 mg at 11/20/14 2223 .  haloperidol lactate (HALDOL) injection 1-4 mg, 1-4 mg, Intravenous, Q3H PRN, Kara Mead V, MD, 4 mg at 11/13/14 2337 .  hydrALAZINE (APRESOLINE) injection 10-40 mg, 10-40 mg, Intravenous, Q4H PRN, Brand Males, MD, 20 mg at 11/05/14 0150 .  insulin aspart (novoLOG) injection 0-20 Units, 0-20 Units, Subcutaneous, TID WC, Earnstine Regal, PA-C, 0 Units at 11/18/14 1700 .  insulin glargine (LANTUS) injection 15 Units, 15 Units, Subcutaneous, Daily, Earnstine Regal, PA-C, 15 Units at 11/20/14 1014 .  levothyroxine (SYNTHROID, LEVOTHROID) tablet 150 mcg, 150 mcg, Oral, QAC breakfast, Kara Mead V, MD, 150 mcg at 11/21/14 0754 .  lip balm (CARMEX) ointment 1 application, 1 application, Topical, BID, Michael Boston, MD, 1 application at 08/65/78 2223 .  metoprolol tartrate (LOPRESSOR) tablet 25 mg, 25 mg, Oral, BID, Juanito Doom, MD, 25 mg at 11/20/14 2223 .  nystatin (MYCOSTATIN/NYSTOP) topical  powder, , Topical, BID, Earnstine Regal, PA-C .  ondansetron [facility]) injection 4 mg, 4 mg, Intravenous, Q6H PRN **OR** ondansetron (ZOFRAN) 8 mg/NS 50 ml IVPB, 8 mg, Intravenous, Q6H PRN, Michael Boston, MD .  pantoprazole sodium (PROTONIX) 40 mg/20 mL oral suspension 40 mg, 40 mg, Oral, Q1200, Mosetta Pigeon, RPH, 40 mg at 11/20/14 1139 .  saccharomyces boulardii (FLORASTOR) capsule 250 mg, 250 mg, Oral, BID, Earnstine Regal, PA-C, 250 mg at 11/20/14 2224 .  sodium chloride 0.9 % injection 10-40 mL, 10-40 mL, Intracatheter, Q12H, Michael Boston, MD, 10 mL at 11/17/14 1000 .  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  He is anxious  to get back to his primary job but says he will give up his  Sunday job.   He and his wife say they take alot of weekend day trips and "date" each other once a week.  Home Assistive Devices / Equipment Home Assistive Devices/Equipment: None Home Equipment: Bedside commode, Walker - standard, Cane - single point  Prior Functional Level Prior Function Level of Independence: Independent  Current Functional Level Cognition  Overall Cognitive Status: Within Functional Limits for tasks assessed Difficult to assess due to: Tracheostomy Orientation Level: Oriented X4 Following Commands: Follows one step commands inconsistently Safety/Judgement: Decreased awareness of safety, Decreased awareness of deficits General Comments: difficult  to assess, pt fairly alert but keeps eys closed most of time; mouths "I love you " to wife     Extremity Assessment (includes Sensation/Coordination)  Upper Extremity Assessment: Generalized weakness  Lower Extremity Assessment: Generalized weakness (at least /5 bil, some difficutly following commands)    ADLs  Overall ADL's : Needs assistance/impaired Eating/Feeding: Set up, Sitting Grooming: Standing, Minimal assistance Lower Body Dressing: Sit to/from stand, Moderate assistance, Cueing for safety, Cueing for sequencing    Mobility  Overal bed mobility: +2 for physical assistance, Needs Assistance Bed Mobility: Rolling Rolling: +2 for physical assistance, Total assist General bed mobility comments: pt in chair    Transfers  Overall transfer level: Needs assistance Equipment used: Rolling walker (2 wheeled) Transfers: Sit to/from Stand Sit to Stand: Min assist, Mod assist General transfer comment: increased time and VC needed.  pt uses momentum and forward lean with sit to stand.    Ambulation / Gait / Stairs / Wheelchair Mobility  Ambulation/Gait Ambulation/Gait assistance: Min assist, +2 physical assistance Ambulation Distance (Feet): 85 Feet (x 2 one sitting rest break) Assistive device: Rolling walker (2 wheeled) Gait Pattern/deviations: Step-to pattern, Step-through pattern Gait velocity: decreased General Gait Details: had recliner follow for safety and to be able to "walk the distance".  Tolerated increased amb distance.  still unsteady/shaky.    Posture / Balance      Special needs/care consideration BiPAP/CPAP  Wears CPAP even for naps at home;  Dr. Chase Caller questions whether to leave trach in until f/u with Dr. Elsworth Soho (Sleep MD) post IP Rehab for possibility of maintaining trach as a cure for OSA Continuous Drip IV No Oxygen  no       Special Bed  Per wife, pt. Was initially on bariatric bed but is now on a regular hospital bed Trach Size #6 cuffless Shiley Wound Vac (area)  no       Skin  Colostomy; mid abdominal incision with dressing clean and dry upon  inspection             Bowel mgmt:  Per colostomy Bladder mgmt:  Using urinal, continent Diabetic mgmt   Hyperglycemia.  May need to check for diabetes.    Previous Home Environment Living Arrangements: Spouse/significant other Available Help at Discharge: Family, Available 24 hours/day Type of Home: House Home Layout: One level Home Access: Stairs to enter Entrance Stairs-Rails: Right, Left, Can reach both Entrance Stairs-Number of Steps: 3 Home Care Services: No  Discharge Living Setting Plans for Discharge Living Setting: Patient's home Type of Home at Discharge: House Discharge Home Layout: One level Discharge Home Access: Stairs to enter Entrance Stairs-Rails: Right, Left, Can reach both Entrance Stairs-Number of Steps: 3 Discharge Bathroom Shower/Tub: Other (comment), Walk-in shower (bathroom update underway at present; will have the above) Discharge Bathroom Toilet: Handicapped height Discharge Bathroom Accessibility:  Yes How Accessible: Accessible via walker Does the patient have any problems obtaining your medications?: No  Social/Family/Support Systems Patient Roles: Spouse Anticipated Caregiver: wife Abishai Viegas is disabled from an arm injury but anticipates she will be able to assist pt. as his condition requires Anticipated Caregiver's Contact Information: Kelvis Berger (C) [phone] Ability/Limitations of Caregiver: old right arm injury  Caregiver Availability: 24/7 Discharge Plan Discussed with Primary Caregiver: Yes Is Caregiver In Agreement with Plan?: Yes Does Caregiver/Family have Issues with Lodging/Transportation while Pt is in Rehab?: No (Wife prefers to stay with pt. in hospital)  Goals/Additional Needs Patient/Family Goal for Rehab: mod I to (S) PT and OT; independent to supervision/mod I with SLP Expected length of stay: 12-17 days Cultural Considerations: "[employer]" Equipment Needs: TBD Pt/Family Agrees to Admission and willing to  participate: Yes Program Orientation Provided & Reviewed with Pt/Caregiver Including Roles  & Responsibilities: Yes  Decrease burden of Care through IP rehab admission: no  Possible need for SNF placement upon discharge:  Not anticipated  Patient Condition: This patient's condition remains as documented in the consult dated 11/19/14, in which the Rehabilitation Physician determined and documented that the patient's condition is appropriate for intensive rehabilitative care in an inpatient rehabilitation facility. Will admit to inpatient rehab today.  Preadmission Screen Completed By:  Retta Diones, 11/21/2014 9:43 AM ______________________________________________________________________   Discussed status with Dr. Letta Pate on 11/21/14 at [phone] and received telephone approval for admission today.  Admission Coordinator:  Retta Diones, time0943/Date02/04/16

## 2014-11-20 NOTE — Progress Notes (Addendum)
PULMONARY / CRITICAL CARE MEDICINE   Name: William [name] MRN: [id number] DOB: 1951/08/21    ADMISSION DATE:  10/27/2014  CHIEF COMPLAINT:  Perforated sigmoid colon  BRIEF PATIENT DESCRIPTION:   64 y/o male former smoker presented with abd pain from perforated sigmoid colon taken to OR emergently.  Remained on vent post op.  Failed extubation 1/16, reintubated. Course complicated by vent dependence requiring trach , GNR bacteremia    SIGNIFICANT EVENTS:  1/10  Admit, Partial colectomy  1/13  Off pressors 1/14  On TNA 1/16  Extubated >> reintubated in PM 1/19  Agitation, hypertension overnight, sedation changed to propofol. 60% / 8 1/20 PEEP weaned to 5, FiO2 40%, diuresing 1/22 more hypoxemic, febrile, plan tracheostomy 1/24 rash- benadryl - zosyn stopped 1/27 agitation >> precedex 1/28 off vent since 11a 2/3 changed to 6 cuffless  STUDIES: 1/10  CT abd/pelvis >> sigmoid colon diverticulitis with perforation, umbilical hernia, b/l renal stones, 8 mm RLL nodule 1/11  ECHO >> EF 55-60%, nml systolic fxn, PA peak 32   1/17  CT Head >> no acute intracranial abnormality, ? Small vessel ischemic changes, L maxillary sinus disease 1/25 CT abd >> collapse/consolidation in both lower lobes, Possible small abscesses in the left paracolic gutter with residual phlegmon in the anterior left anatomic pelvis, at the site of previously seen diverticular abscess. 11/16/14: TC continues, cuff went down, spoke with PMV  11/19/2014: 8 shiley trach with PMV - talking , mobilizing. Speech reportedly okayed advancing to puree diet. Possible inpatient rehab. No track secretions. Wife reports hx of OSA under care of Dr Dennard Schaumann v Dr Laurence Aly [facility] [phone] and needing his CPAP settings.    SUBJECTIVE/OVERNIGHT/INTERVAL HX 2/3/.16: Still not heard back from his sleep docs at [facility]. Wife expresses desire to change to Dr Vassie Loll of [facility] for sleep and nodule [facility]. He is ok having trach in for few months to see Dr Vassie Loll as opd and see if his OSA needs permanent trach or not but he want it downsized. Inpatient rehab eval in progress. Denies complaints  VITAL SIGNS: Temp:  [98.2 F (36.8 C)-98.9 F (37.2 C)] 98.9 F (37.2 C) (02/03 0555) Pulse Rate:  [88-115] 107 (02/03 0555) Resp:  [16-20] 16 (02/03 0555) BP: (98-149)/(60-83) 98/60 mmHg (02/03 0555) SpO2:  [94 %-97 %] 94 % (02/03 0745) FiO2 (%):  [28 %] 28 % (02/03 0309) Weight:  [118.888 kg (262 lb 1.6 oz)] 118.888 kg (262 lb 1.6 oz) (02/03 0134)   VENTILATOR SETTINGS: Vent Mode:  [-]  FiO2 (%):  [28 %] 28 %   INTAKE / OUTPUT: Intake/Output      02/02 0701 - 02/03 0700 02/03 0701 - 02/04 0700   P.O. 960    I.V. (mL/kg) 1195 (10.1)    IV Piggyback 800    Total Intake(mL/kg) 2955 (24.9)    Urine (mL/kg/hr) 1850 (0.6)    Stool 200 (0.1)    Total Output 2050     Net +905            PHYSICAL EXAMINATION:  Gen: no distress HEENT: NCAT EOMi, trach clean PULM: CTA bialterally. No distress CV: s1 s2 RR distant AB: low bowel sounds, midline scar, wound vac Derm: macular erythematous rash over trunk & BL thighs -less erythema, improved Ext: SCD Neuro: awake, RASS 0, follows commands, interacting well  LABS:  CBC  Recent Labs Lab 11/15/14 0413 11/18/14 0430  WBC  12.7* 12.0*  HGB 12.4* 12.2*  HCT 37.1* 37.9*  PLT 450* 432*   BMET  Recent Labs Lab 11/16/14 0500 11/17/14 0457 11/18/14 0430  NA 148* 141 139  K 3.6 3.5 3.7  CL 112 107 104  CO2 28 27 26   BUN 34* 32* 26*  CREATININE 0.87 0.75 0.84  GLUCOSE 145* 148* 105*   Electrolytes  Recent Labs Lab 11/14/14 0422  11/16/14 0500 11/17/14 0457 11/18/14 0430  CALCIUM 9.1  < > 8.9 8.8 8.8  MG 2.2  --   --  2.1  --   PHOS 3.7  --   --  3.1  --   < > = values in this interval not displayed. Sepsis Markers No results for input(s): LATICACIDVEN, PROCALCITON, O2SATVEN in the last 168 hours. ABG No results for  input(s): PHART, PCO2ART, PO2ART in the last 168 hours. Liver Enzymes  Recent Labs Lab 11/14/14 0422 11/18/14 0430  AST 26 22  ALT 38 29  ALKPHOS 78 61  BILITOT 0.5 0.4  ALBUMIN 2.8* 2.9*   Glucose  Recent Labs Lab 11/18/14 1406 11/18/14 1616 11/19/14 0721 11/19/14 1152 11/19/14 1828 11/19/14 2200  GLUCAP 169* 83 111* 111* 90 119*    Imaging No results found.  ASSESSMENT / PLAN:  PULMONARY ETT 1/10 >> 1/16, 1/16 >> 1/22, Trach (Dr Jenne PaneBates) 1/22 >>  A: Acute respiratory failure with hypoxemia, worse 1/21> suspect HCAP, +/- pulm edema (net even hospitalization) Prolonged Vent course> tracheostomy 1/22 Hx of tobacco abuse with 8 mm RLL nodule on CT abd/pelvis - will need outpt FU Hx of OSA   - Tolerating trach well. Now downsized to # 6 cuffless. Tolerated this well. He is very motivated to have trach removed. He has a CPAP which he used religiously prior to his hospitalization. He also has some concerns about how his employer would feel about him having a trach. If indeed he can tolerate returning to nocturnal CPAP then we should be able to decannulate him prior to his d/c from rehab. We do have a call out [facility] but suspect that this will not prevent us from trying to remove trach eventually.   P:   Continue CAPPED trach during day, then switch back to ATC during night When his wife can bring his CPAP in we should initiate Capping trials 24/7 and have him use his CPAP again. His home settings are CPAP of 15 cmH2O; Would do this for at least 2 nights. If does well then we can consider decannulation while he is in acute rehab Continue guaifenesin, pulm toilette We will see him once again when he is in rehab  Have arranged OPD fu with [facility]ollAlva at [facility] set up 01/02/15 at 15.30h Mobilize, upright   Simonne MartinetPeter E Maryjane Benedict ACNP-BC [facility]/Critical Care Pager # [phone] OR # [phone] if no answer  Other  issues per [facility]   Future Appointments Date Time Provider Department Center  01/02/2015 3:30 PM Oretha Milchakesh Alva V, MD [facility] None      Dr. Kalman ShanMurali Ramaswamy, M.D., F.C.C.P Pulmonary and Critical Care Medicine Staff Physician [facility] [facility] Pager: [phone], If no answer or between  15:00h - 7:00h: call [phone]  11/20/2014 9:26 AM

## 2014-11-20 NOTE — Procedures (Signed)
Trach downsized from #8 cuffed Shiley to #6 cuffless Shiley by Zenia ResidesPeter Babcock, ACNP while RT assisted.  Trach secured and capped.  No complications.

## 2014-11-20 NOTE — Progress Notes (Signed)
Occupational Therapy Treatment Patient Details Name: William OuJoseph Ishida MRN: [id number] DOB: 05/23/1951 Today's Date: 11/20/2014    History of present illness [age] M with HTN, nephrolithiasis [facility] with abdominal pain secondary to a perforated sigmoid. Taken emergently to OR 1/10 for partial colectomy, VDRF, currently with  tracheostomy    OT comments  Pt with good progress, Would benefit from CIR  Follow Up Recommendations  CIR    Equipment Recommendations  Other (comment) (TBD)    Recommendations for Other Services      Precautions / Restrictions Precautions Precautions: Fall Restrictions Weight Bearing Restrictions: No       Mobility Bed Mobility               General bed mobility comments: pt in chair  Transfers Overall transfer level: Needs assistance Equipment used: Rolling walker (2 wheeled) Transfers: Sit to/from Stand Sit to Stand: Mod assist         General transfer comment: increased time and VC needed    Balance                                   ADL Overall ADL's : Needs assistance/impaired Eating/Feeding: Set up;Sitting   Grooming: Standing;Minimal assistance               Lower Body Dressing: Sit to/from stand;Moderate assistance;Cueing for safety;Cueing for sequencing                        Vision                     Perception     Praxis      Cognition   Behavior During Therapy: [facility]FL for tasks assessed/performed Overall Cognitive Status: Within Functional Limits for tasks assessed                               General Comments      Pertinent Vitals/ Pain       Pain Assessment: No/denies pain         Progress Toward Goals  OT Goals(current goals can now be found in the care plan section)  Progress towards OT goals: Progressing toward goals  Acute Rehab OT Goals Patient Stated Goal: get back to work at [employer] Time For Goal Achievement: 12/04/14  Plan  Discharge plan needs to be updated    Co-evaluation                 End of Session     Activity Tolerance Patient tolerated treatment well   Patient Left in chair;with call bell/phone within reach;with family/visitor present   Nurse Communication Mobility status        Time: 0930-1009 OT Time Calculation (min): 39 min  Charges: OT General Charges $OT Visit: 1 Procedure OT Treatments $Self Care/Home Management : 38-52 mins  Sharay Bellissimo D 11/20/2014, 10:13 AM

## 2014-11-21 ENCOUNTER — Encounter (HOSPITAL_COMMUNITY): Payer: Self-pay | Admitting: *Deleted

## 2014-11-21 ENCOUNTER — Inpatient Hospital Stay (HOSPITAL_COMMUNITY)
Admission: RE | Admit: 2014-11-21 | Discharge: 2014-11-28 | DRG: 091 | Disposition: A | Discharge status: Home / Self Care | Payer: 59 | Source: Intra-hospital | Attending: Physical Medicine & Rehabilitation | Admitting: Physical Medicine & Rehabilitation

## 2014-11-21 DIAGNOSIS — I10 Essential (primary) hypertension: Secondary | ICD-10-CM | POA: Diagnosis present

## 2014-11-21 DIAGNOSIS — Z9049 Acquired absence of other specified parts of digestive tract: Secondary | ICD-10-CM | POA: Diagnosis present

## 2014-11-21 DIAGNOSIS — E039 Hypothyroidism, unspecified: Secondary | ICD-10-CM | POA: Diagnosis present

## 2014-11-21 DIAGNOSIS — R410 Disorientation, unspecified: Secondary | ICD-10-CM | POA: Diagnosis present

## 2014-11-21 DIAGNOSIS — J189 Pneumonia, unspecified organism: Secondary | ICD-10-CM | POA: Diagnosis present

## 2014-11-21 DIAGNOSIS — R451 Restlessness and agitation: Secondary | ICD-10-CM | POA: Diagnosis present

## 2014-11-21 DIAGNOSIS — Z930 Tracheostomy status: Secondary | ICD-10-CM

## 2014-11-21 DIAGNOSIS — Z933 Colostomy status: Secondary | ICD-10-CM | POA: Diagnosis not present

## 2014-11-21 DIAGNOSIS — G7281 Critical illness myopathy: Secondary | ICD-10-CM | POA: Diagnosis present

## 2014-11-21 DIAGNOSIS — Y95 Nosocomial condition: Secondary | ICD-10-CM | POA: Diagnosis present

## 2014-11-21 DIAGNOSIS — R739 Hyperglycemia, unspecified: Secondary | ICD-10-CM | POA: Diagnosis present

## 2014-11-21 DIAGNOSIS — G4733 Obstructive sleep apnea (adult) (pediatric): Secondary | ICD-10-CM | POA: Diagnosis present

## 2014-11-21 DIAGNOSIS — R911 Solitary pulmonary nodule: Secondary | ICD-10-CM | POA: Diagnosis present

## 2014-11-21 HISTORY — DX: Chronic obstructive pulmonary disease, unspecified: J44.9

## 2014-11-21 LAB — GLUCOSE, CAPILLARY
GLUCOSE-CAPILLARY: 93 mg/dL (ref 70–99)
GLUCOSE-CAPILLARY: 101 mg/dL — AB (ref 70–99)
GLUCOSE-CAPILLARY: 113 mg/dL — AB (ref 70–99)
Glucose-Capillary: 118 mg/dL — ABNORMAL HIGH (ref 70–99)

## 2014-11-21 LAB — CBC
HCT: 39.6 % (ref 39.0–52.0)
Hemoglobin: 12.9 g/dL — ABNORMAL LOW (ref 13.0–17.0)
MCH: 30.5 pg (ref 26.0–34.0)
MCHC: 32.6 g/dL (ref 30.0–36.0)
MCV: 93.6 fL (ref 78.0–100.0)
Platelets: 386 10*3/uL (ref 150–400)
RBC: 4.23 MIL/uL (ref 4.22–5.81)
RDW: 14.7 % (ref 11.5–15.5)
WBC: 10.7 10*3/uL — AB (ref 4.0–10.5)

## 2014-11-21 LAB — CREATININE, SERUM
CREATININE: 1.15 mg/dL (ref 0.50–1.35)
GFR, EST AFRICAN AMERICAN: 78 mL/min — AB (ref >90)
GFR, EST NON AFRICAN AMERICAN: 67 mL/min — AB (ref >90)

## 2014-11-21 MED ORDER — PANTOPRAZOLE SODIUM 40 MG PO PACK
40.0000 mg | PACK | Freq: Every day | ORAL | Status: DC
  Administered 2014-11-23 – 2014-11-28 (×6): 40 mg via ORAL
  Filled 2014-11-21 (×10): qty 20

## 2014-11-21 MED ORDER — NYSTATIN 100000 UNIT/GM EX POWD
Freq: Two times a day (BID) | CUTANEOUS | Status: DC
  Administered 2014-11-21 – 2014-11-28 (×13): via TOPICAL
  Filled 2014-11-21 (×2): qty 15

## 2014-11-21 MED ORDER — ENOXAPARIN SODIUM 40 MG/0.4ML ~~LOC~~ SOLN
40.0000 mg | SUBCUTANEOUS | Status: DC
  Administered 2014-11-22 – 2014-11-28 (×7): 40 mg via SUBCUTANEOUS
  Filled 2014-11-21 (×9): qty 0.4

## 2014-11-21 MED ORDER — INSULIN ASPART 100 UNIT/ML ~~LOC~~ SOLN
0.0000 [IU] | Freq: Three times a day (TID) | SUBCUTANEOUS | Status: DC
  Administered 2014-11-22: 3 [IU] via SUBCUTANEOUS

## 2014-11-21 MED ORDER — ENOXAPARIN SODIUM 40 MG/0.4ML ~~LOC~~ SOLN: 40.0000 mg | Freq: Every day | SUBCUTANEOUS | Status: DC

## 2014-11-21 MED ORDER — ONDANSETRON HCL 4 MG PO TABS
4.0000 mg | ORAL_TABLET | Freq: Four times a day (QID) | ORAL | Status: DC | PRN
Start: 2014-11-21 — End: 2014-11-28

## 2014-11-21 MED ORDER — ONDANSETRON HCL 4 MG/2ML IJ SOLN: 4.0000 mg | Freq: Four times a day (QID) | INTRAMUSCULAR | Status: DC | PRN

## 2014-11-21 MED ORDER — ACETAMINOPHEN 325 MG PO TABS: 325.0000 mg | ORAL_TABLET | ORAL | Status: DC | PRN

## 2014-11-21 MED ORDER — METOPROLOL TARTRATE 25 MG PO TABS
25.0000 mg | ORAL_TABLET | Freq: Two times a day (BID) | ORAL | Status: DC
  Administered 2014-11-21 – 2014-11-28 (×14): 25 mg via ORAL
  Filled 2014-11-21 (×16): qty 1

## 2014-11-21 MED ORDER — SACCHAROMYCES BOULARDII 250 MG PO CAPS
250.0000 mg | ORAL_CAPSULE | Freq: Two times a day (BID) | ORAL | Status: DC
  Administered 2014-11-21 – 2014-11-28 (×14): 250 mg via ORAL
  Filled 2014-11-21 (×16): qty 1

## 2014-11-21 MED ORDER — SORBITOL 70 % SOLN: 30.0000 mL | Freq: Every day | Status: DC | PRN

## 2014-11-21 MED ORDER — ENSURE COMPLETE PO LIQD
237.0000 mL | Freq: Two times a day (BID) | ORAL | Status: DC
  Administered 2014-11-22 – 2014-11-28 (×11): 237 mL via ORAL

## 2014-11-21 MED ORDER — COLLAGENASE 250 UNIT/GM EX OINT
TOPICAL_OINTMENT | Freq: Two times a day (BID) | CUTANEOUS | Status: AC
  Administered 2014-11-21 – 2014-11-22 (×2): via TOPICAL
  Filled 2014-11-21: qty 30

## 2014-11-21 MED ORDER — FUROSEMIDE 20 MG PO TABS
20.0000 mg | ORAL_TABLET | Freq: Every day | ORAL | Status: DC
  Administered 2014-11-22 – 2014-11-28 (×7): 20 mg via ORAL
  Filled 2014-11-21 (×8): qty 1

## 2014-11-21 MED ORDER — CHLORHEXIDINE GLUCONATE 0.12 % MT SOLN
15.0000 mL | Freq: Two times a day (BID) | OROMUCOSAL | Status: DC
  Administered 2014-11-21 – 2014-11-28 (×14): 15 mL via OROMUCOSAL
  Filled 2014-11-21 (×16): qty 15

## 2014-11-21 MED ORDER — SODIUM CHLORIDE 0.9 % IJ SOLN
10.0000 mL | INTRAMUSCULAR | Status: DC | PRN
  Administered 2014-11-21 – 2014-11-22 (×2): 10 mL
  Filled 2014-11-21: qty 40

## 2014-11-21 MED ORDER — LEVOTHYROXINE SODIUM 150 MCG PO TABS
150.0000 ug | ORAL_TABLET | Freq: Every day | ORAL | Status: DC
  Administered 2014-11-22 – 2014-11-28 (×7): 150 ug via ORAL
  Filled 2014-11-21 (×9): qty 1

## 2014-11-21 MED ORDER — INSULIN GLARGINE 100 UNIT/ML ~~LOC~~ SOLN
15.0000 [IU] | Freq: Every day | SUBCUTANEOUS | Status: DC
  Administered 2014-11-22: 15 [IU] via SUBCUTANEOUS
  Filled 2014-11-21: qty 0.15

## 2014-11-21 NOTE — H&P (Signed)
Physical Medicine and Rehabilitation Admission H&P   Chief Complaint  Patient presents with  . Abdominal Pain  : HPI: William [name] is a 64 y.o. right handed male former smoker/OSA admitted 10/27/2014 with diffuse abdominal pain worse in the left lower quadrant and low-grade fever. Patient independent prior to admission living with his wife. CT of chest/ abdomen and imaging revealed significant sigmoid diverticulitis with contained perforation as well as incidental findings of 8 mm right lower lobe nodule. Underwent partial colectomy, colostomy with exploratory laparotomy and repair of umbilical hernia 10/28/2014 per Dr. Carolynne Edouard. Maintained on broad-spectrum antibiotics and transitioned to intravenous Levaquin until 11/21/2014 and stopped. Postoperative hypotension/ARDS/healthcare associated pneumonia with critical care medicine follow-up and aggressive ventilatory support failed extubation 11/02/2014 and ultimately required tracheostomy 11/08/2014 per Dr. Jenne Pane and has been off ventilator since 11/14/2014. Patient completed course of Levaquin and metronidazole 11/21/2014 for healthcare associated pneumonia and followed by infectious disease... Wound care nurse follow-up for colostomy care and education. TNA initiated for nutritional support and died advanced to a dysphagia 1 thin liquid. Bouts of confusion and agitation cranial CT scan negative for any acute changes. Subcutaneous Lovenox for DVT prophylaxis. Speech therapy follow-up currently with a #6 cufflessShiley Physical and occupational therapy evaluations are completed with slow progress. M.D. has requested physical medicine rehabilitation consult. Patient was admitted for comprehensive rehabilitation program   ROS Review of Systems  Constitutional: Positive for fever.  Gastrointestinal: Positive for nausea and abdominal pain.  Musculoskeletal: Positive for myalgias.  All other systems reviewed and are negative   Past Medical  History  Diagnosis Date  . Renal disorder     kidney stones  . Hypertension   . Thyroid disease    Past Surgical History  Procedure Laterality Date  . Partial colectomy N/A 10/28/2014    Procedure: PARTIAL COLECTOMY Sigmoid; Surgeon: [name], MD; Location: [facility]; Service: General; Laterality: N/A;  . Colostomy N/A 10/28/2014    Procedure: COLOSTOMY; Surgeon: [name], MD; Location: [facility]; Service: General; Laterality: N/A;  . Laparotomy N/A 10/28/2014    Procedure: EXPLORATORY LAPAROTOMY; Surgeon: [name], MD; Location: [facility]; Service: General; Laterality: N/A;  . Tracheostomy tube placement N/A 11/08/2014    Procedure: TRACHEOSTOMY; Surgeon: [name], MD; Location: [facility]; Service: ENT; Laterality: N/A;   History reviewed. No pertinent family history. Social History:  reports that he has quit smoking. He does not have any smokeless tobacco history on file. His alcohol and drug histories are not on file. Allergies:  Allergies  Allergen Reactions  . Aloprim [Allopurinol] Other (See Comments)    Unknown reaction  . Simvastatin Other (See Comments)    Unknown reaction   Medications Prior to Admission  Medication Sig Dispense Refill  . cholecalciferol (VITAMIN D) 1000 UNITS tablet Take 1,000 Units by mouth daily.    Marland Kitchen levothyroxine (SYNTHROID, LEVOTHROID) 150 MCG tablet Take 150 mcg by mouth daily before breakfast.    . lisinopril (PRINIVIL,ZESTRIL) 10 MG tablet Take 10 mg by mouth daily.    . meloxicam (MOBIC) 15 MG tablet Take 15 mg by mouth daily.      Home: Home Living Family/patient expects to be discharged to:: Private residence Living Arrangements: Spouse/significant other Available Help at Discharge: Family, Available 24 hours/day Type of Home: House Home Access: Stairs to enter [employer] of Steps: 3 Entrance Stairs-Rails: Right,  Left, Can reach both Home Layout: One level Home Equipment: Bedside commode, Walker - standard, Cane - single point  Functional History: Prior  Function Level of Independence: Independent  Functional Status:  Mobility: Bed Mobility Overal bed mobility: +2 for physical assistance, Needs Assistance Bed Mobility: Rolling Rolling: +2 for physical assistance, Total assist General bed mobility comments: pt sitting EOB on arrival Transfers Overall transfer level: Needs assistance Equipment used: Rolling walker (2 wheeled) Transfers: Sit to/from Stand Sit to Stand: Mod assist General transfer comment: extra time and forward momentum with sit to stand. difficulty with control for stand to sit.  Ambulation/Gait Ambulation/Gait assistance: Min assist, +2 safety/equipment, Mod assist Ambulation Distance (Feet): 75 Feet (x 2 sitting rest breaks) Assistive device: Rolling walker (2 wheeled) Gait Pattern/deviations: Step-through pattern, Decreased stride length, Trunk flexed Gait velocity: decreased General Gait Details: had recliner follow for safety and to be able to "walk the distance". Tolerated increased amb distance. still unsteady/shaky.    ADL: ADL Overall ADL's : Needs assistance/impaired Grooming: Sitting, Minimal assistance Lower Body Dressing: Sit to/from stand, Moderate assistance, +2 for physical assistance, +2 for safety/equipment, Cueing for sequencing, Cueing for safety  Cognition: Cognition Overall Cognitive Status: Within Functional Limits for tasks assessed Orientation Level: Oriented X4 Cognition Arousal/Alertness: Awake/alert Behavior During Therapy: WFL for tasks assessed/performed Overall Cognitive Status: Within Functional Limits for tasks assessed Area of Impairment: Safety/judgement, Awareness, Problem solving Following Commands: Follows one step commands inconsistently Safety/Judgement: Decreased awareness of safety, Decreased awareness of  deficits General Comments: difficult to assess, pt fairly alert but keeps eys closed most of time; mouths "I love you " to wife Difficult to assess due to: Tracheostomy  Physical Exam: Blood pressure 107/73, pulse 90, temperature 98.6 F (37 C), temperature source Oral, resp. rate 16, height 5\' 11"  (1.803 m), weight 118.888 kg (262 lb 1.6 oz), SpO2 94 %. Physical Exam Constitutional: He is oriented to person, place, and time.  64 year old right-handed Caucasian male in no acute distress  HENT:  Head: Normocephalic.  Eyes: EOM are normal.  Neck:  Tracheostomy tube #6 in place with red trach cap Cardiovascular: Normal rate and regular rhythm.  Respiratory:  Decreased breath sounds at the bases but clear to auscultation  GI: Soft. Bowel sounds are normal.  Abdomen is mildly distended. Surgical site with heavy bulky dressing, midline W-D with good granulation tissue around edges no foul odor   as well as colostomy bag  Musculoskeletal: He exhibits edema (bilateral LE).  Neurological: He is alert and oriented to person, place, and time.  Good insight and awareness. UES: 4/5 deltoid,bicep, tricep, wrist and hands 4+. LES: 3/5 HF, 4/5 knees, 4/5 ankles. Fairly good sitting balance  Psychiatric: He has a normal mood and affect. His behavior is normal. Judgment and thought content normal    Lab Results Last 48 Hours    Results for orders placed or performed during the hospital encounter of 10/27/14 (from the past 48 hour(s))  Glucose, capillary Status: Abnormal   Collection Time: 11/18/14 8:08 AM  Result Value Ref Range   Glucose-Capillary 105 (H) 70 - 99 mg/dL  Glucose, capillary Status: Abnormal   Collection Time: 11/18/14 2:06 PM  Result Value Ref Range   Glucose-Capillary 169 (H) 70 - 99 mg/dL  Glucose, capillary Status: None   Collection Time: 11/18/14 4:16 PM  Result Value Ref Range   Glucose-Capillary 83 70 - 99 mg/dL    Comment 1 Notify RN   Glucose, capillary Status: Abnormal   Collection Time: 11/19/14 7:21 AM  Result Value Ref Range   Glucose-Capillary 111 (H) 70 - 99 mg/dL  Glucose, capillary Status: Abnormal  Collection Time: 11/19/14 11:52 AM  Result Value Ref Range   Glucose-Capillary 111 (H) 70 - 99 mg/dL  Glucose, capillary Status: None   Collection Time: 11/19/14 6:28 PM  Result Value Ref Range   Glucose-Capillary 90 70 - 99 mg/dL  Glucose, capillary Status: Abnormal   Collection Time: 11/19/14 10:00 PM  Result Value Ref Range   Glucose-Capillary 119 (H) 70 - 99 mg/dL      Imaging Results (Last 48 hours)    No results found.       Medical Problem List and Plan: 1. 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 Status post partial colectomy, colostomy and exploratory laparotomy 10/28/2014 2. DVT Prophylaxis/Anticoagulation: Subcutaneous Lovenox. Monitor platelet counts of any signs of bleeding 3. Pain Management: Tylenol as needed 4. Postoperative ARDS/ventilatory support/HCAP. Status post tracheostomy 11/08/2014. Downsize trach tube(#6 cuffless Shiley) was tolerated. Question if patient can slowly be decannulated with history of OSA. Follow-up pulmonary services 5. Neuropsych: This patient is capable of making decisions on his own behalf. 6. Skin/Wound Care: Skin care as directed 7. Fluids/Electrolytes/Nutrition/decreased nutritional storage: Strict I&O follow-up. Advance diet as tolerated chemistries 8. Mood/postoperative confusion/restlessness. Cranial CT scan negative. Mental status much improved. Check sleep patterns 9. Hypothyroidism. Synthroid 10. Hypertension. Lopressor 25 mg twice a day 11. Incidental findings of 8 mm right lower lobe nodule on CT abdomen and pelvis. Follow-up as outpatient 12. Hyperglycemia. No reported history of diabetes mellitus. Currently  on Lantus insulin 15 units daily. Plan to check hemoglobin A1c. 13. Healthcare associated pneumonia. 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 Status post partial colectomy, colostomy and exploratory laparotomy 10/28/2014 1. . 2. Patient is admitted to receive collaborative, interdisciplinary care between the physiatrist, rehab nursing staff, and therapy team. 3. Patient's level of medical complexity and substantial therapy needs in context of that medical necessity cannot be provided at a lesser intensity of care such as a SNF. 4. Patient has experienced substantial functional loss from his/her baseline which was documented above under the "Functional History" and "Functional Status" headings. Judging by the patient's diagnosis, physical exam, and functional history, the patient has potential for functional progress which will result in measurable gains while on inpatient rehab. These gains will be of substantial and practical use upon discharge in facilitating mobility and self-care at the household level. 5. Physiatrist will provide 24 hour management of medical needs as well as oversight of the therapy plan/treatment and provide guidance as appropriate regarding the interaction of the two. 6. 24 hour rehab nursing will assist with bladder management, bowel management, safety, skin/wound care, disease management, medication administration, pain management and patient education and help integrate therapy concepts, techniques,education, etc. 7. PT will assess and treat for/with: pre gait, gait training, endurance , safety, equipment, neuromuscular re education. Goals are: Mod I. 8. OT will assess and treat for/with: ADLs, Cognitive perceptual skills, Neuromuscular re education, safety, endurance, equipment. Goals  are: Mod I. Therapy may not  proceed with showering this patient. 9. SLP will assess and treat for/with: swallow retraining, cognition. Goals are: Decannulation, safe and adequate po intake. 10. Case Management and Social Worker will assess and treat for psychological issues and discharge planning. 11. Team conference will be held weekly to assess progress toward goals and to determine barriers to discharge. 12. Patient will receive at least 3 hours of therapy per day at least 5 days per week.  13. ELOS: 7-10days  14. Prognosis: excellent      Erick Colace M.D. [facility] FAAPM&R (Sports Med, Neuromuscular Med) Diplomate Am Board of Electrodiagnostic Med  11/20/2014

## 2014-11-21 NOTE — Progress Notes (Signed)
Nurse called [facility]  Pt's PICC line was capped off for transport.  Speech therapist advanced pt to regular diet prior to transfer.

## 2014-11-21 NOTE — Progress Notes (Signed)
[name], MD Physician Signed Physical Medicine and Rehabilitation Consult Note 11/19/2014 10:00 AM  Related encounter: ED to Hosp-Admission (Current) from 10/27/2014 in [facility]    Expand All Collapse All        Physical Medicine and Rehabilitation Consult Reason for Consult: Debilitation/sigmoid diverticulitis/perforated bowel Referring Physician: CCS   HPI: William [name] is a 64 y.o. right handed male former smoker admitted 10/27/2014 with diffuse abdominal pain worse in the left lower quadrant and low-grade fever. Patient independent prior to admission living with his wife. CT of abdomen and imaging revealed significant sigmoid diverticulitis with contained perforation. Underwent partial colectomy, colostomy with exploratory laparotomy and repair of umbilical hernia 10/28/2014 per Dr. Carolynne Edouardoth. Maintained on broad-spectrum antibiotics. Postoperative hypotension/ARDS with critical care medicine follow-up and aggressive ventilatory support failed extubation 11/02/2014 and ultimately required tracheostomy 11/08/2014 per Dr. Jenne PaneBates and has been off ventilator since 11/14/2014... Wound care nurse follow-up for colostomy care and education. TNA initiated for nutritional support and died advanced to a dysphagia 1 thin liquid. Bouts of confusion and agitation cranial CT scan negative for any acute changes. Subcutaneous Lovenox for DVT prophylaxis. Speech therapy follow-up currently with a #8 Shiley trach with PMV. Physical occupational therapy evaluations are completed with slow progress. M.D. has requested physical medicine rehabilitation consult.  Review of Systems  Constitutional: Positive for fever.  Gastrointestinal: Positive for nausea and abdominal pain.  Musculoskeletal: Positive for myalgias.  All other systems reviewed and are negative.  Past Medical History  Diagnosis Date  . Renal disorder     kidney stones  . Hypertension    . Thyroid disease    Past Surgical History  Procedure Laterality Date  . Partial colectomy N/A 10/28/2014    Procedure: PARTIAL COLECTOMY Sigmoid; Surgeon: Chevis PrettyPaul Toth III, MD; Location: [facility]; Service: General; Laterality: N/A;  . Colostomy N/A 10/28/2014    Procedure: COLOSTOMY; Surgeon: Chevis PrettyPaul Toth III, MD; Location: [facility]; Service: General; Laterality: N/A;  . Laparotomy N/A 10/28/2014    Procedure: EXPLORATORY LAPAROTOMY; Surgeon: Chevis PrettyPaul Toth III, MD; Location: [facility]; Service: General; Laterality: N/A;  . Tracheostomy tube placement N/A 11/08/2014    Procedure: TRACHEOSTOMY; Surgeon: Christia Readingwight Bates, MD; Location: [facility]; Service: ENT; Laterality: N/A;   History reviewed. No pertinent family history. Social History:  reports that he has quit smoking. He does not have any smokeless tobacco history on file. His alcohol and drug histories are not on file. Allergies:  Allergies  Allergen Reactions  . Aloprim [Allopurinol] Other (See Comments)    Unknown reaction  . Simvastatin Other (See Comments)    Unknown reaction   Medications Prior to Admission  Medication Sig Dispense Refill  . cholecalciferol (VITAMIN D) 1000 UNITS tablet Take 1,000 Units by mouth daily.    Marland Kitchen. levothyroxine (SYNTHROID, LEVOTHROID) 150 MCG tablet Take 150 mcg by mouth daily before breakfast.    . lisinopril (PRINIVIL,ZESTRIL) 10 MG tablet Take 10 mg by mouth daily.    . meloxicam (MOBIC) 15 MG tablet Take 15 mg by mouth daily.      Home: Home Living Family/patient expects to be discharged to:: Private residence Living Arrangements: Spouse/significant other Available Help at Discharge: Family, Available 24 hours/day Type of Home: House Home Access: Stairs to enter [employer] Stairs-Number of Steps: 3 Entrance Stairs-Rails: Right, Left, Can reach both Home Layout: One level Home Equipment: Bedside commode, Walker - standard,  Cane - single point  Functional History: Prior Function Level of Independence: Independent Functional Status:  Mobility: Bed Mobility Overal bed mobility: +2 for physical assistance, Needs Assistance Bed Mobility: Rolling Rolling: +2 for physical assistance, Total assist General bed mobility comments: pt sitting in chair Transfers Overall transfer level: Needs assistance Equipment used: Rolling walker (2 wheeled) Transfers: Sit to/from Stand Sit to Stand: +2 safety/equipment, Mod assist General transfer comment: extra time for rising from recliner, cues for hand placement and safety,  Ambulation/Gait Ambulation/Gait assistance: +2 safety/equipment Ambulation Distance (Feet): 5 Feet Assistive device: Rolling walker (2 wheeled) Gait Pattern/deviations: Step-to pattern, Shuffle General Gait Details: decreased R foot clearance, walked 5' forward and backward, very shakey, decreased stability going backward.    ADL: ADL Overall ADL's : Needs assistance/impaired Grooming: Sitting, Minimal assistance Lower Body Dressing: Sit to/from stand, Moderate assistance, +2 for physical assistance, +2 for safety/equipment, Cueing for sequencing, Cueing for safety  Cognition: Cognition Overall Cognitive Status: Within Functional Limits for tasks assessed Orientation Level: Oriented X4 Cognition Arousal/Alertness: Awake/alert Behavior During Therapy: WFL for tasks assessed/performed Overall Cognitive Status: Within Functional Limits for tasks assessed Area of Impairment: Safety/judgement, Awareness, Problem solving Following Commands: Follows one step commands inconsistently Safety/Judgement: Decreased awareness of safety, Decreased awareness of deficits General Comments: difficult to assess, pt fairly alert but keeps eys closed most of time; mouths "I love you " to wife Difficult to assess due to: Tracheostomy  Blood pressure 129/77, pulse 89, temperature 98.4 F (36.9 C),  temperature source Oral, resp. rate 18, height 5' 11.5" (1.816 m), weight 121.5 kg (267 lb 13.7 oz), SpO2 90 %. Physical Exam  Constitutional: He is oriented to person, place, and time.  64 year old right-handed Caucasian male in no acute distress  HENT:  Head: Normocephalic.  Eyes: EOM are normal.  Neck:  Tracheostomy tube #8 in place with PMV  Cardiovascular: Normal rate and regular rhythm.  Respiratory:  Decreased breath sounds at the bases but clear to auscultation  GI: Soft. Bowel sounds are normal.  Abdomen is mildly distended. Surgical site with heavy bulky dressing as well as colostomy tube  Musculoskeletal: He exhibits edema (bilateral LE).  Neurological: He is alert and oriented to person, place, and time.  Good insight and awareness. UES: 4/5 deltoid,bicep, tricep, wrist and hands 4+. LES: 3/5 HF, 4/5 knees, 4/5 ankles. Fairly good sitting balance  Psychiatric: He has a normal mood and affect. His behavior is normal. Judgment and thought content normal.     Lab Results Last 24 Hours    Results for orders placed or performed during the hospital encounter of 10/27/14 (from the past 24 hour(s))  Glucose, capillary Status: Abnormal   Collection Time: 11/18/14 2:06 PM  Result Value Ref Range   Glucose-Capillary 169 (H) 70 - 99 mg/dL  Glucose, capillary Status: None   Collection Time: 11/18/14 4:16 PM  Result Value Ref Range   Glucose-Capillary 83 70 - 99 mg/dL   Comment 1 Notify RN   Glucose, capillary Status: Abnormal   Collection Time: 11/19/14 7:21 AM  Result Value Ref Range   Glucose-Capillary 111 (H) 70 - 99 mg/dL      Imaging Results (Last 48 hours)    No results found.    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 Does the need for close, 24 hr/day medical supervision in concert with the patient's rehab needs make it unreasonable for this patient to be  served in a less intensive setting? Yes 2. Co-Morbidities requiring supervision/potential complications: trach, acute respiratory failure, s/p ex lap, morbid obesity 3. Due to bladder management, bowel  management, safety, skin/wound care, disease management, medication administration, pain management and patient education, does the patient require 24 hr/day rehab nursing? Yes 4. Does the patient require coordinated care of a physician, rehab nurse, PT (1-2 hrs/day, 5 days/week), OT (1-2 hrs/day, 5 days/week) and SLP (1-2 hrs/day, 5 days/week) to address physical and functional deficits in the context of the above medical diagnosis(es)? Yes Addressing deficits in the following areas: balance, endurance, locomotion, strength, transferring, bowel/bladder control, bathing, dressing, feeding, grooming, toileting, speech, swallowing and psychosocial support 5. Can the patient actively participate in an intensive therapy program of at least 3 hrs of therapy per day at least 5 days per week? Yes 6. The potential for patient to make measurable gains while on inpatient rehab is excellent 7. Anticipated functional outcomes upon discharge from inpatient rehab are modified independent and supervision with PT, modified independent and supervision with OT, modified independent and supervision with SLP. 8. Estimated rehab length of stay to reach the above functional goals is: 12-17 days 9. Does the patient have adequate social supports and living environment to accommodate these discharge functional goals? Yes 10. Anticipated D/C setting: Home 11. Anticipated post D/C treatments: HH therapy 12. Overall Rehab/Functional Prognosis: excellent  RECOMMENDATIONS: This patient's condition is appropriate for continued rehabilitative care in the following setting: CIR Patient has agreed to participate in recommended program. Yes Note that insurance prior authorization may be required for reimbursement for recommended  care.  Comment: Rehab Admissions Coordinator to follow up.  Thanks,  Ranelle Oyster, MD, [place] [facility]     11/19/2014       Revision History     Date/Time User Provider Type Action   11/19/2014 1:52 PM Ranelle Oyster, MD Physician Sign   11/19/2014 10:54 AM Charlton Amor, PA-C Physician Assistant Pend   View Details Report       Routing History     Date/Time From To Method   11/19/2014 1:52 PM Ranelle Oyster, MD Ranelle Oyster, MD In Basket

## 2014-11-21 NOTE — Progress Notes (Signed)
Trach CK done this am. Pt trach capped,vitals stable, pt doing well at this time.

## 2014-11-21 NOTE — Procedures (Signed)
Pt placed on CPAP 10 with small nasal mask.

## 2014-11-21 NOTE — Evaluation (Signed)
Clinical/Bedside Swallow Evaluation Patient Details  Name: William [name] MRN: [id number] Date of Birth: 06-Mar-1951  Today's Date: 11/21/2014 Time: SLP Start Time (ACUTE ONLY): 1100 SLP Stop Time (ACUTE ONLY): 1120 SLP Time Calculation (min) (ACUTE ONLY): 20 min  Past Medical History:  Past Medical History  Diagnosis Date  . Renal disorder     kidney stones  . Hypertension   . Thyroid disease   . Hypothyroidism 11/20/2014  . Essential hypertension 11/20/2014  . Protein-calorie malnutrition 11/20/2014   Past Surgical History:  Past Surgical History  Procedure Laterality Date  . Partial colectomy N/A 10/28/2014    Procedure: PARTIAL COLECTOMY Sigmoid;  Surgeon: [name], MD;  Location: [facility];  Service: General;  Laterality: N/A;  . Colostomy N/A 10/28/2014    Procedure: COLOSTOMY;  Surgeon: [name], MD;  Location: [facility];  Service: General;  Laterality: N/A;  . Laparotomy N/A 10/28/2014    Procedure: EXPLORATORY LAPAROTOMY;  Surgeon: [name], MD;  Location: [facility];  Service: General;  Laterality: N/A;  . Tracheostomy tube placement N/A 11/08/2014    Procedure: TRACHEOSTOMY;  Surgeon: [name], MD;  Location: [facility];  Service: ENT;  Laterality: N/A;   HPI:  64 yo male with HTN, nephrolithiasis who presented to [facility] on 1/10 with abdominal pain secondary to a perforated sigmoid. Taken emergently to OR 1/10 for partial colectomy, VDRF (1/10-1/16; 1/16-1/22; trached 1/22), currently with  tracheostomy.  Downsized yesterday to #6; capped during daytime.  Pt has been eating a pureed diet; swallow eval ordered today.  For D/C to CIR later this afternoon.   Assessment / Plan / Recommendation Clinical Impression  Pt presents with a normal oropharyngeal swallow.  Has been tolerating a pureed diet for several days - SLP ordered for swallow evaluation to advance diet.  Presents with adequate mastication, consistent swallow response, no s/s of aspiration.  Strong, clear phonation  post-swallow.  Recommend advancing diet to regular, thin liquids.  No SLP f/u is warranted for swallowing nor speech (PMV) given presence of capped trach.      Aspiration Risk  Mild    Diet Recommendation Regular;Thin liquid   Liquid Administration via: Cup;Straw Medication Administration: Whole meds with liquid Supervision: Patient able to self feed    Other  Recommendations Oral Care Recommendations: Oral care BID   Follow Up Recommendations  None      Swallow Study Prior Functional Status       General Date of Onset: 10/27/14 HPI: 64 yo male with HTN, nephrolithiasis who presented to [facility] on 1/10 with abdominal pain secondary to a perforated sigmoid. Taken emergently to OR 1/10 for partial colectomy, VDRF (1/10-1/16; 1/16-1/22; trached 1/22), currently with  tracheostomy.  Downsized yesterday to #6; capped during daytime.  Pt has been eating a pureed diet; swallow eval ordered today.  For D/C to CIR later this afternoon. Type of Study: Bedside swallow evaluation Previous Swallow Assessment: none per records Diet Prior to this Study: Dysphagia 1 (puree);Thin liquids Temperature Spikes Noted: No Respiratory Status: Room air Trach Size and Type: #6;Other (Comment) (capped; PMV no longer warranted) History of Recent Intubation: Yes Length of Intubations (days): 12 days Date extubated:  (trach 1/22) Behavior/Cognition: Alert;Cooperative;Pleasant mood Oral Cavity - Dentition: Dentures, top;Dentures, bottom Self-Feeding Abilities: Able to feed self Patient Positioning: Upright in chair Baseline Vocal Quality: Clear Volitional Cough: Strong Volitional Swallow: Able to elicit    Oral/Motor/Sensory Function Overall Oral Motor/Sensory Function: Appears within functional limits for tasks assessed  Ice Chips Ice chips: Within functional limits Presentation: Self Fed   Thin Liquid Thin Liquid: Within functional limits Presentation: Cup;Self Fed    Nectar Thick Nectar Thick Liquid:  Not tested   Honey Thick Honey Thick Liquid: Not tested   Puree Puree: Within functional limits Presentation: Self Fed;Spoon   Solid  Tawn Fitzner L. [place], [place] CCC/SLP Pager [phone]     Solid: Within functional limits Presentation: Self Fed       Blenda MountsCouture, Laxmi Choung Laurice 11/21/2014,11:29 AM

## 2014-11-21 NOTE — Progress Notes (Signed)
Retta Diones, RN Rehab Admission Coordinator Signed Physical Medicine and Rehabilitation PMR Pre-admission 11/20/2014 3:10 PM  Related encounter: ED to Hosp-Admission (Current) from 10/27/2014 in [facility] All Collapse All   PMR Admission Coordinator Pre-Admission Assessment  Patient: William [name] is an 64 y.o., male MRN: [id number] DOB: 29-Oct-1950 Height: 5' 11"  (180.3 cm) Weight: 118.3 kg (260 lb 12.9 oz)  Insurance Information HMO: PPO: PCP: IPA: 80/20: OTHER: Group # [id number] PRIMARY: [employer] Policy#: [id number] Subscriber: self CM Name: Geryl Rankins Phone#: [phone] Fax#:  Pre-Cert#: [id number] Employer: Self employed, [employer] care plan Benefits: Phone #: [phone] Name: Reine Just. Date: 10/18/14 Deduct: $800 (met all) Out of Pocket Max: $1600 (met $1550.85) Life Max: Unlimited CIR: 80% with 60 day limit SNF: 80% with 60 day limit combined with inpatient rehab Outpatient: 23 PT visits and 30 ST visits per year Co-Pay: $20/visit Home Health: 80% with 60 visits limit Co-Pay: 20% DME: 80% Co-Pay: 20% Providers: in network only. Need referrals to go to a specialist.  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 right handed male former smoker/OSA admitted 10/27/2014 with diffuse abdominal pain worse in the left lower quadrant and low-grade fever. Patient independent prior to admission living with his wife. CT of chest/ abdomen and imaging  revealed significant sigmoid diverticulitis with contained perforation as well as incidental findings of 8 mm right lower lobe nodule. Underwent partial colectomy, colostomy with exploratory laparotomy and repair of umbilical hernia 42/59/5638 per Dr. Marlou Starks. Maintained on broad-spectrum antibiotics and transitioned to intravenous Levaquin until 11/21/2014 and stopped. Postoperative hypotension/ARDS/healthcare associated pneumonia with critical care medicine follow-up and aggressive ventilatory support failed extubation 11/02/2014 and ultimately required tracheostomy 11/08/2014 per Dr. Redmond Baseman and has been off ventilator since 11/14/2014. Patient completed course of Levaquin and metronidazole 11/21/2014 for healthcare associated pneumonia and followed by infectious disease... Wound care nurse follow-up for colostomy care and education. TNA initiated for nutritional support and died advanced to a dysphagia 1 thin liquid. Bouts of confusion and agitation cranial CT scan negative for any acute changes. Subcutaneous Lovenox for DVT prophylaxis. Speech therapy follow-up currently with a #6 cuffless Shiley trach. Physical and occupational therapy evaluations are completed with slow progress. M.D. has requested physical medicine rehabilitation consult. Patient to be admitted for comprehensive inpatient rehabilitation program.  Past Medical History  Past Medical History  Diagnosis Date  . Renal disorder     kidney stones  . Hypertension   . Thyroid disease   . Hypothyroidism 11/20/2014  . Essential hypertension 11/20/2014  . Protein-calorie malnutrition 11/20/2014    Family History  family history is not on file.  Prior Rehab/Hospitalizations: pt. denies  Current Medications   Current facility-administered medications:  . acetaminophen (TYLENOL) solution 650 mg, 650 mg, Per Tube, Q4H PRN, Collene Gobble, MD, 650 mg at 11/14/14 1428 . acetaminophen (TYLENOL) suppository 650 mg,  650 mg, Rectal, Q6H PRN, Michael Boston, MD, 650 mg at 11/08/14 2030 . antiseptic oral rinse (CPC / CETYLPYRIDINIUM CHLORIDE 0.05%) solution 7 mL, 7 mL, Mouth Rinse, QID, Raylene Miyamoto, MD, 7 mL at 11/21/14 0528 . bacitracin ointment, , Topical, BID, Kara Mead, [place] . chlorhexidine (PERIDEX) 0.12 % solution 15 mL, 15 mL, Mouth Rinse, BID, Raylene Miyamoto,  MD, 15 mL at 11/21/14 0755 . collagenase (SANTYL) ointment, , Topical, BID, Michael Boston, MD, 1 application at 81/19/14 2226 . dextrose 5 % solution, , Intravenous, Continuous, Raylene Miyamoto, MD, Last Rate: 50 mL/hr at 11/20/14 1708, 50 mL at 11/20/14 1708 . diphenhydrAMINE (BENADRYL) injection 25 mg, 25 mg, Intravenous, Q8H PRN, Donita Brooks, NP, 25 mg at 11/14/14 0347 . enoxaparin (LOVENOX) injection 40 mg, 40 mg, Subcutaneous, Daily, Kara Mead V, MD, 40 mg at 11/20/14 1013 . feeding supplement (ENSURE COMPLETE) (ENSURE COMPLETE) liquid 237 mL, 237 mL, Oral, BID BM, Dorann Ou, RD, 237 mL at 11/20/14 1420 . fentaNYL (SUBLIMAZE) injection 25 mcg, 25 mcg, Intravenous, Once, Earnstine Regal, PA-C . fentaNYL (SUBLIMAZE) injection 50-100 mcg, 50-100 mcg, Intravenous, Q2H PRN, Chesley Mires, MD, 100 mcg at 11/17/14 0550 . furosemide (LASIX) injection 20 mg, 20 mg, Intravenous, Daily, Raylene Miyamoto, MD, 20 mg at 11/20/14 1013 . guaiFENesin (ROBITUSSIN) 100 MG/5ML solution 200 mg, 10 mL, Oral, BID, Juanito Doom, MD, 200 mg at 11/20/14 2223 . haloperidol lactate (HALDOL) injection 1-4 mg, 1-4 mg, Intravenous, Q3H PRN, Kara Mead V, MD, 4 mg at 11/13/14 2337 . hydrALAZINE (APRESOLINE) injection 10-40 mg, 10-40 mg, Intravenous, Q4H PRN, Brand Males, MD, 20 mg at 11/05/14 0150 . insulin aspart (novoLOG) injection 0-20 Units, 0-20 Units, Subcutaneous, TID WC, Earnstine Regal, PA-C, 0 Units at 11/18/14 1700 . insulin glargine (LANTUS) injection 15 Units, 15 Units, Subcutaneous, Daily, Earnstine Regal, PA-C, 15 Units at 11/20/14 1014 . levothyroxine (SYNTHROID, LEVOTHROID) tablet 150 mcg, 150 mcg, Oral, QAC breakfast, Kara Mead V, MD, 150 mcg at 11/21/14 0754 . lip balm (CARMEX) ointment 1 application, 1 application, Topical, BID, Michael Boston, MD, 1 application at 78/29/56 2223 . metoprolol tartrate (LOPRESSOR) tablet 25 mg, 25 mg, Oral, BID, Juanito Doom, MD, 25 mg at 11/20/14 2223 . nystatin (MYCOSTATIN/NYSTOP) topical powder, , Topical, BID, Earnstine Regal, PA-C . ondansetron [facility]) injection 4 mg, 4 mg, Intravenous, Q6H PRN **OR** ondansetron (ZOFRAN) 8 mg/NS 50 ml IVPB, 8 mg, Intravenous, Q6H PRN, Michael Boston, MD . pantoprazole sodium (PROTONIX) 40 mg/20 mL oral suspension 40 mg, 40 mg, Oral, Q1200, Mosetta Pigeon, RPH, 40 mg at 11/20/14 1139 . saccharomyces boulardii (FLORASTOR) capsule 250 mg, 250 mg, Oral, BID, Earnstine Regal, PA-C, 250 mg at 11/20/14 2224 . sodium chloride 0.9 % injection 10-40 mL, 10-40 mL, Intracatheter, Q12H, Michael Boston, MD, 10 mL at 11/17/14 1000 . 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 He is anxious to get back to his primary job but says he will give up his Sunday job. He and his wife say they take alot of weekend day trips and "date" each other once a week.  Home Assistive Devices / Equipment Home Assistive Devices/Equipment: None Home Equipment: Bedside commode, Walker - standard, Cane - single point  Prior Functional Level Prior Function Level of Independence:  Independent  Current Functional Level Cognition  Overall Cognitive Status: Within Functional Limits for tasks assessed Difficult to assess due to: Tracheostomy Orientation Level: Oriented X4 Following Commands: Follows one  step commands inconsistently Safety/Judgement: Decreased awareness of safety, Decreased awareness of deficits General Comments: difficult to assess, pt fairly alert but keeps eys closed most of time; mouths "I love you " to wife   Extremity Assessment (includes Sensation/Coordination)  Upper Extremity Assessment: Generalized weakness  Lower Extremity Assessment: Generalized weakness (at least /5 bil, some difficutly following commands)    ADLs  Overall ADL's : Needs assistance/impaired Eating/Feeding: Set up, Sitting Grooming: Standing, Minimal assistance Lower Body Dressing: Sit to/from stand, Moderate assistance, Cueing for safety, Cueing for sequencing    Mobility  Overal bed mobility: +2 for physical assistance, Needs Assistance Bed Mobility: Rolling Rolling: +2 for physical assistance, Total assist General bed mobility comments: pt in chair    Transfers  Overall transfer level: Needs assistance Equipment used: Rolling walker (2 wheeled) Transfers: Sit to/from Stand Sit to Stand: Min assist, Mod assist General transfer comment: increased time and VC needed. pt uses momentum and forward lean with sit to stand.    Ambulation / Gait / Stairs / Wheelchair Mobility  Ambulation/Gait Ambulation/Gait assistance: Min assist, +2 physical assistance Ambulation Distance (Feet): 85 Feet (x 2 one sitting rest break) Assistive device: Rolling walker (2 wheeled) Gait Pattern/deviations: Step-to pattern, Step-through pattern Gait velocity: decreased General Gait Details: had recliner follow for safety and to be able to "walk the distance". Tolerated increased amb distance. still unsteady/shaky.    Posture / Balance      Special needs/care  consideration BiPAP/CPAP Wears CPAP even for naps at home; Dr. Chase Caller questions whether to leave trach in until f/u with Dr. Elsworth Soho (Sleep MD) post IP Rehab for possibility of maintaining trach as a cure for OSA Continuous Drip IV No Oxygen no  Special Bed Per wife, pt. Was initially on bariatric bed but is now on a regular hospital bed Trach Size #6 cuffless Shiley Wound Vac (area) no  Skin Colostomy; mid abdominal incision with dressing clean and dry upon inspection   Bowel mgmt: Per colostomy Bladder mgmt: Using urinal, continent Diabetic mgmt Hyperglycemia. May need to check for diabetes.    Previous Home Environment Living Arrangements: Spouse/significant other Available Help at Discharge: Family, Available 24 hours/day Type of Home: House Home Layout: One level Home Access: Stairs to enter Entrance Stairs-Rails: Right, Left, Can reach both Entrance Stairs-Number of Steps: 3 Home Care Services: No  Discharge Living Setting Plans for Discharge Living Setting: Patient's home Type of Home at Discharge: House Discharge Home Layout: One level Discharge Home Access: Stairs to enter Entrance Stairs-Rails: Right, Left, Can reach both Entrance Stairs-Number of Steps: 3 Discharge Bathroom Shower/Tub: Other (comment), Walk-in shower (bathroom update underway at present; will have the above) Discharge Bathroom Toilet: Handicapped height Discharge Bathroom Accessibility: Yes How Accessible: Accessible via walker Does the patient have any problems obtaining your medications?: No  Social/Family/Support Systems Patient Roles: Spouse Anticipated Caregiver: wife Beniah Magnan is disabled from an arm injury but anticipates she will be able to assist pt. as his condition requires Anticipated Caregiver's Contact Information: Mylz Yuan (C) [phone] Ability/Limitations of Caregiver: old right arm injury  Caregiver Availability: 24/7 Discharge Plan  Discussed with Primary Caregiver: Yes Is Caregiver In Agreement with Plan?: Yes Does Caregiver/Family have Issues with Lodging/Transportation while Pt is in Rehab?: No (Wife prefers to stay with pt. in hospital)  Goals/Additional Needs Patient/Family Goal for Rehab: mod I to (S) PT and OT; independent to supervision/mod I with SLP Expected length of stay: 12-17 days Cultural Considerations: "[employer]" Equipment Needs: TBD Pt/Family Agrees to  Admission and willing to participate: Yes Program Orientation Provided & Reviewed with Pt/Caregiver Including Roles & Responsibilities: Yes  Decrease burden of Care through IP rehab admission: no  Possible need for SNF placement upon discharge: Not anticipated  Patient Condition: This patient's condition remains as documented in the consult dated 11/19/14, in which the Rehabilitation Physician determined and documented that the patient's condition is appropriate for intensive rehabilitative care in an inpatient rehabilitation facility. Will admit to inpatient rehab today.  Preadmission Screen Completed By: Retta Diones, 11/21/2014 9:43 AM ______________________________________________________________________  Discussed status with Dr. Letta Pate on 11/21/14 at [phone] and received telephone approval for admission today.  Admission Coordinator: Retta Diones, time0943/Date02/04/16          Cosigned by: Charlett Blake, MD at 11/21/2014 9:47 AM  Revision History

## 2014-11-21 NOTE — Progress Notes (Addendum)
Received pt. From [facility].Pt. And family were oriented to unit and routine.Safety plan was explained,fall prevention plan was explained and sign by pt. And RN.No equipment from home at the bedside.Safety video was showed to pt. And his family.Keep monitoring pt. Closely and assessing pt.'s needs.Pt. Has a midline surgical incision with dressing on,and a colostomy on his left lower quadrant.

## 2014-11-21 NOTE — Progress Notes (Signed)
Pt trach capped per Pt request.  Pt tolerating well at this time, Vitals are WNL and pt is in no distress.  RT to continue to monitor and assess as needed.

## 2014-11-21 NOTE — Trach Care Team (Signed)
Trach Care Progression Note   Patient Details Name: Johnney OuJoseph Heimann MRN: [id number] DOB: 02/26/1951 Today's Date: 11/21/2014   Tracheostomy Assessment    Tracheostomy Shiley 6 mm Uncuffed (Active)  Status Capped 11/21/2014 12:09 PM  Site Assessment Clean;Dry 11/21/2014 12:09 PM  Site Care Dressing applied 11/21/2014 12:09 PM  Inner Cannula Care Other (Comment) 11/21/2014 12:09 PM  Ties Assessment Secure 11/21/2014 12:09 PM  Cuff pressure (cm) 0 cm 11/21/2014 12:09 PM  Emergency Equipment at bedside Yes 11/21/2014 12:09 PM     Care Needs     Respiratory Therapy O2 Device: Not Delivered SpO2: 95 %    Speech Language Pathology      Physical Therapy      Occupational Therapy      Nutritional      Case Management/Social Work      Theatre managerrovider Trach Care Team/Provider Recommendations Trach Care Team Members Present-  Cherylin MylarLauren Doyle, RT, Shon BatonJenna Holloman, SW, Joaquin CourtsKimberly Harris, RD, Oletha CruelAngela Joyce, RN.     New adm to CIR this pm. Trach capped on arrival. Pt has hx of OSA. Plans not clear re: progression of trach. Continue to follow.          Keishawn Darsey, Silva BandyDebra Anita (scribe for team) 11/21/2014, 2:11 PM

## 2014-11-21 NOTE — Progress Notes (Signed)
Patient ID: William [name], male   DOB: 10-Apr-1951, 64 y.o.   MRN: [id number]     [facility]      [address] [place]   [place], [place] [address]    Phone: [phone] FAX: [phone]     Subjective: No issues.  VSS.  Afebrile.  Wants solid, not pureed food.   Objective:  Vital signs:  Filed Vitals:   11/21/14 0038 11/21/14 0457 11/21/14 0526 11/21/14 0700  BP:   101/66   Pulse: 94 109 93   Temp:   98.7 F (37.1 C)   TempSrc:   Oral   Resp: Height:      Weight:    260 lb 12.9 oz (118.3 kg)  SpO2: 95% 96% 94%     Last BM Date: 11/19/14  Intake/Output   Yesterday:  02/03 0701 - 02/04 0700 In: 2625 [P.O.:1200; I.V.:1225; IV Piggyback:200] Out: 2536 [UYQIH:4742; Stool:300] This shift:    I/O last 3 completed shifts: In: 4718.3 [P.O.:1920; I.V.:2198.3; IV Piggyback:600] Out: 5956 [LOVFI:4332; Stool:400]    Physical Exam: General: Pt awake/alert/oriented x4 in no acute distress Chest: cta.  Trach, capped.  No chest wall pain w good excursion CV:  Pulses intact.  Regular rhythm MS: Normal AROM mjr joints.  No obvious deformity Abdomen: Soft.  Nondistended.  Midline wound is c/d/i.  LUQ ostomy, pink and viable, functioning.   No evidence of peritonitis.  No incarcerated hernias. Ext:  SCDs BLE.  No mjr edema.  No cyanosis Skin: No petechiae / purpura   Problem List:   Principal Problem:   Diverticulitis of colon with perforation s/p colectomy/colostomy 10/28/2014 Active Problems:   Shock circulatory   Acute respiratory failure with hypoxia   Solitary pulmonary nodule on lung CT   Sleep apnea   Fever   Rash   Abscess of suppurative peritonitis   Bacteroides fragilis infection   Normocytic anemia   Obesity   Tracheostomy, acute management   Acute respiratory failure   Acute on chronic respiratory failure   Tracheostomy status   Hypothyroidism   Essential hypertension   Protein-calorie malnutrition  Abscess of abdominal cavity    Results:   Labs: Results for orders placed or performed during the hospital encounter of 10/27/14 (from the past 48 hour(s))  Glucose, capillary     Status: Abnormal   Collection Time: 11/19/14 11:52 AM  Result Value Ref Range   Glucose-Capillary 111 (H) 70 - 99 mg/dL  Glucose, capillary     Status: None   Collection Time: 11/19/14  6:28 PM  Result Value Ref Range   Glucose-Capillary 90 70 - 99 mg/dL  Glucose, capillary     Status: Abnormal   Collection Time: 11/19/14 10:00 PM  Result Value Ref Range   Glucose-Capillary 119 (H) 70 - 99 mg/dL  Glucose, capillary     Status: Abnormal   Collection Time: 11/20/14  7:45 AM  Result Value Ref Range   Glucose-Capillary 101 (H) 70 - 99 mg/dL  Glucose, capillary     Status: Abnormal   Collection Time: 11/20/14 11:32 AM  Result Value Ref Range   Glucose-Capillary 107 (H) 70 - 99 mg/dL  Glucose, capillary     Status: Abnormal   Collection Time: 11/20/14  4:56 PM  Result Value Ref Range   Glucose-Capillary 100 (H) 70 - 99 mg/dL  Glucose, capillary     Status: Abnormal   Collection Time: 11/20/14 10:04 PM  Result Value Ref  Range   Glucose-Capillary 124 (H) 70 - 99 mg/dL  Glucose, capillary     Status: None   Collection Time: 11/21/14  7:29 AM  Result Value Ref Range   Glucose-Capillary 93 70 - 99 mg/dL    Imaging / Studies: No results found.  Medications / Allergies:  Scheduled Meds: . antiseptic oral rinse  7 mL Mouth Rinse QID  . bacitracin   Topical BID  . chlorhexidine  15 mL Mouth Rinse BID  . collagenase   Topical BID  . enoxaparin (LOVENOX) injection  40 mg Subcutaneous Daily  . feeding supplement (ENSURE COMPLETE)  237 mL Oral BID BM  . fentaNYL  25 mcg Intravenous Once  . furosemide  20 mg Intravenous Daily  . guaiFENesin  10 mL Oral BID  . insulin aspart  0-20 Units Subcutaneous TID WC  . insulin glargine  15 Units Subcutaneous Daily  . levofloxacin (LEVAQUIN) IV  750 mg Intravenous  Q24H  . levothyroxine  150 mcg Oral QAC breakfast  . lip balm  1 application Topical BID  . metoprolol tartrate  25 mg Oral BID  . metronidazole  500 mg Intravenous Q8H  . nystatin   Topical BID  . pantoprazole sodium  40 mg Oral Q1200  . saccharomyces boulardii  250 mg Oral BID  . sodium chloride  10-40 mL Intracatheter Q12H   Continuous Infusions: . dextrose 50 mL (11/20/14 1708)   PRN Meds:.acetaminophen (TYLENOL) oral liquid 160 mg/5 mL, acetaminophen, diphenhydrAMINE, fentaNYL, haloperidol lactate, hydrALAZINE, ondansetron (ZOFRAN) IV **OR** ondansetron (ZOFRAN) IV, sodium chloride  Antibiotics: Anti-infectives    Start     Dose/Rate Route Frequency Ordered Stop   11/20/14 0000  levofloxacin (LEVAQUIN) 750 MG/150ML SOLN     750 mg100 mL/hr over 90 Minutes Intravenous Every 24 hours 11/20/14 1134     11/20/14 0000  metroNIDAZOLE (FLAGYL) 5-0.79 MG/ML-% IVPB     500 mg100 mL/hr over 60 Minutes Intravenous Every 8 hours 11/20/14 1134     11/12/14 1800  metroNIDAZOLE (FLAGYL) IVPB 500 mg     500 mg100 mL/hr over 60 Minutes Intravenous Every 8 hours 11/12/14 1554     11/12/14 1700  levofloxacin (LEVAQUIN) IVPB 750 mg     750 mg100 mL/hr over 90 Minutes Intravenous Every 24 hours 11/12/14 1554     11/11/14 1800  vancomycin (VANCOCIN) 1,250 mg in sodium chloride 0.9 % 250 mL IVPB  Status:  Discontinued     1,250 mg166.7 mL/hr over 90 Minutes Intravenous Every 12 hours 11/11/14 0957 11/12/14 1554   11/11/14 1100  vancomycin (VANCOCIN) 1,250 mg in sodium chloride 0.9 % 250 mL IVPB     1,250 mg166.7 mL/hr over 90 Minutes Intravenous  Once 11/11/14 0957 11/11/14 1202   11/10/14 0815  ciprofloxacin (CIPRO) IVPB 400 mg  Status:  Discontinued     400 mg200 mL/hr over 60 Minutes Intravenous Every 12 hours 11/10/14 0807 11/12/14 1554   11/09/14 1000  fluconazole (DIFLUCAN) tablet 100 mg  Status:  Discontinued     100 mg Oral Daily 11/09/14 0931 11/11/14 1005   11/08/14 2200  vancomycin  (VANCOCIN) 1,250 mg in sodium chloride 0.9 % 250 mL IVPB  Status:  Discontinued     1,250 mg166.7 mL/hr over 90 Minutes Intravenous Every 12 hours 11/08/14 1001 11/10/14 1002   11/08/14 1200  piperacillin-tazobactam (ZOSYN) IVPB 3.375 g  Status:  Discontinued     3.375 g12.5 mL/hr over 240 Minutes Intravenous 3 times per day 11/08/14 1001 11/10/14  2245   11/08/14 1015  vancomycin (VANCOCIN) 2,500 mg in sodium chloride 0.9 % 500 mL IVPB     2,500 mg250 mL/hr over 120 Minutes Intravenous NOW 11/08/14 1001 11/08/14 1238   11/01/14 2000  vancomycin (VANCOCIN) 1,250 mg in sodium chloride 0.9 % 250 mL IVPB  Status:  Discontinued     1,250 mg166.7 mL/hr over 90 Minutes Intravenous Every 12 hours 11/01/14 0702 11/02/14 0823   10/30/14 2200  vancomycin (VANCOCIN) IVPB 1000 mg/200 mL premix  Status:  Discontinued     1,000 mg200 mL/hr over 60 Minutes Intravenous Every 8 hours 10/30/14 1746 11/01/14 0702   10/28/14 1800  vancomycin (VANCOCIN) 1,250 mg in sodium chloride 0.9 % 250 mL IVPB  Status:  Discontinued     1,250 mg166.7 mL/hr over 90 Minutes Intravenous Every 12 hours 10/28/14 0358 10/28/14 0824   10/28/14 1400  vancomycin (VANCOCIN) IVPB 1000 mg/200 mL premix  Status:  Discontinued     1,000 mg200 mL/hr over 60 Minutes Intravenous Every 12 hours 10/28/14 0824 10/30/14 1746   10/28/14 1000  micafungin (MYCAMINE) 100 mg in sodium chloride 0.9 % 100 mL IVPB  Status:  Discontinued     100 mg100 mL/hr over 1 Hours Intravenous Daily 10/28/14 0342 10/28/14 0344   10/28/14 0800  metroNIDAZOLE (FLAGYL) IVPB 500 mg  Status:  Discontinued     500 mg100 mL/hr over 60 Minutes Intravenous Every 8 hours 10/28/14 0335 10/28/14 0343   10/28/14 0600  piperacillin-tazobactam (ZOSYN) IVPB 3.375 g  Status:  Discontinued     3.375 g12.5 mL/hr over 240 Minutes Intravenous 3 times per day 10/28/14 0339 11/04/14 1152   10/28/14 0400  micafungin (MYCAMINE) 100 mg in sodium chloride 0.9 % 100 mL IVPB  Status:  Discontinued      100 mg100 mL/hr over 1 Hours Intravenous Daily 10/28/14 0342 11/04/14 1152   10/28/14 0400  vancomycin (VANCOCIN) 1,750 mg in sodium chloride 0.9 % 500 mL IVPB     1,750 mg250 mL/hr over 120 Minutes Intravenous  Once 10/28/14 0353 10/28/14 0749   10/28/14 0030  [MAR Hold]  metroNIDAZOLE (FLAGYL) IVPB 500 mg     (MAR Hold since 10/28/14 0043)   500 mg100 mL/hr over 60 Minutes Intravenous  Once 10/28/14 0026 10/28/14 0200   10/27/14 2030  piperacillin-tazobactam (ZOSYN) IVPB 3.375 g     3.375 g100 mL/hr over 30 Minutes Intravenous  Once 10/27/14 2026 10/27/14 2110        Assessment/Plan Sigmoid diverticulitis, Perforated Bowel EXPLORATORY LAPAROTOMY, PARTIAL COLECTOMY Sigmoid, COLOSTOMY,  Repair umbilical hernia, 10/28/2014, Chevis PrettyPaul Toth III, MD.  -POD#25 -stop antibiotics D7/7 -BID wet to dry dressing changes -ambulate -ostomy care -SCD/lovenox Sepsis with hypotension/Bacteroides bacteremia -stop antibiotics today VDRF/tracheostomy -appreciate pulmonary assistance -[facility] -on pureed, will have SLP eval if we can advance to solids   Dispo--awaiting insurance approval for CIR   Ashok NorrisEmina Arlina Sabina, ANP-BC [facility] Pager [phone])  11/21/2014 8:16 AM

## 2014-11-22 ENCOUNTER — Inpatient Hospital Stay (HOSPITAL_COMMUNITY): Payer: 59

## 2014-11-22 ENCOUNTER — Inpatient Hospital Stay (HOSPITAL_COMMUNITY): Payer: 59 | Admitting: Speech Pathology

## 2014-11-22 ENCOUNTER — Inpatient Hospital Stay (HOSPITAL_COMMUNITY): Payer: 59 | Admitting: *Deleted

## 2014-11-22 DIAGNOSIS — Z930 Tracheostomy status: Secondary | ICD-10-CM

## 2014-11-22 DIAGNOSIS — Z933 Colostomy status: Secondary | ICD-10-CM

## 2014-11-22 DIAGNOSIS — G7281 Critical illness myopathy: Principal | ICD-10-CM

## 2014-11-22 LAB — GLUCOSE, CAPILLARY
Glucose-Capillary: 94 mg/dL (ref 70–99)
Glucose-Capillary: 112 mg/dL — ABNORMAL HIGH (ref 70–99)
Glucose-Capillary: 145 mg/dL — ABNORMAL HIGH (ref 70–99)
Glucose-Capillary: 114 mg/dL — ABNORMAL HIGH (ref 70–99)

## 2014-11-22 LAB — CBC WITH DIFFERENTIAL/PLATELET
BASOS PCT: 0 % (ref 0–1)
Basophils Absolute: 0 10*3/uL (ref 0.0–0.1)
Eosinophils Absolute: 0.4 10*3/uL (ref 0.0–0.7)
Eosinophils Relative: 4 % (ref 0–5)
HEMATOCRIT: 39.4 % (ref 39.0–52.0)
Hemoglobin: 12.9 g/dL — ABNORMAL LOW (ref 13.0–17.0)
LYMPHS ABS: 2.5 10*3/uL (ref 0.7–4.0)
LYMPHS PCT: 23 % (ref 12–46)
MCH: 30.4 pg (ref 26.0–34.0)
MCHC: 32.7 g/dL (ref 30.0–36.0)
MCV: 92.9 fL (ref 78.0–100.0)
Monocytes Absolute: 1.3 10*3/uL — ABNORMAL HIGH (ref 0.1–1.0)
Monocytes Relative: 12 % (ref 3–12)
Neutro Abs: 6.6 10*3/uL (ref 1.7–7.7)
Neutrophils Relative %: 61 % (ref 43–77)
Platelets: 355 10*3/uL (ref 150–400)
RBC: 4.24 MIL/uL (ref 4.22–5.81)
RDW: 14.7 % (ref 11.5–15.5)
WBC: 10.9 10*3/uL — ABNORMAL HIGH (ref 4.0–10.5)

## 2014-11-22 LAB — COMPREHENSIVE METABOLIC PANEL
ALT: 21 U/L (ref 0–53)
AST: 19 U/L (ref 0–37)
Albumin: 3.1 g/dL — ABNORMAL LOW (ref 3.5–5.2)
Alkaline Phosphatase: 60 U/L (ref 39–117)
Anion gap: 7 (ref 5–15)
BUN: 14 mg/dL (ref 6–23)
CO2: 28 mmol/L (ref 19–32)
CREATININE: 1.1 mg/dL (ref 0.50–1.35)
Calcium: 9 mg/dL (ref 8.4–10.5)
Chloride: 104 mmol/L (ref 96–112)
GFR calc Af Amer: 82 mL/min — ABNORMAL LOW (ref >90)
GFR, EST NON AFRICAN AMERICAN: 71 mL/min — AB (ref >90)
Glucose, Bld: 110 mg/dL — ABNORMAL HIGH (ref 70–99)
Potassium: 4 mmol/L (ref 3.5–5.1)
Sodium: 139 mmol/L (ref 135–145)
TOTAL PROTEIN: 6.2 g/dL (ref 6.0–8.3)
Total Bilirubin: 0.5 mg/dL (ref 0.3–1.2)

## 2014-11-22 LAB — HEMOGLOBIN A1C
HEMOGLOBIN A1C: 6.7 % — AB (ref 4.8–5.6)
MEAN PLASMA GLUCOSE: 146 mg/dL

## 2014-11-22 MED ORDER — ALTEPLASE 2 MG IJ SOLR
2.0000 mg | Freq: Once | INTRAMUSCULAR | Status: DC
  Filled 2014-11-22: qty 2

## 2014-11-22 NOTE — IPOC Note (Signed)
Overall Plan of Care [facility](IPOC) Patient Details Name: William [name] MRN: [id number] DOB: 09/03/1951  Admitting Diagnosis: Critical illness myopathy  Hospital Problems: Principal Problem:   Critical illness myopathy Active Problems:   Tracheostomy status   S/P colostomy     Functional Problem List: Nursing Endurance, Motor, Nutrition, Safety, Skin Integrity  PT Balance, Edema, Endurance, Motor, Pain, Safety, Skin Integrity  OT Balance, Endurance, Motor, Safety, Pain  SLP    TR         Basic ADL's: OT Grooming, Eating, Bathing, Dressing, Toileting     Advanced  ADL's: OT Simple Meal Preparation     Transfers: PT Bed Mobility, Bed to Chair, Car, Occupational psychologisturniture  OT Toilet, Research scientist (life sciences)Tub/Shower     Locomotion: PT Ambulation, Psychologist, prison and probation servicesWheelchair Mobility, Stairs     Additional Impairments: OT None  SLP        TR      Anticipated Outcomes Item Anticipated Outcome  Self Feeding Mod I  Swallowing      Basic self-care  Mod I  Toileting  Mod I   Bathroom Transfers Mod I  Bowel/Bladder  Continent to bladder,Colostomy.  Transfers  mod I  Locomotion  mod I community ambulation  Communication     Cognition     Pain  Less than 3,on scale 1 to 10.  Safety/Judgment  Pt. will be free from falls.   Therapy Plan: PT Intensity: Minimum of 1-2 x/day ,45 to 90 minutes PT Frequency: 5 out of 7 days PT Duration Estimated Length of Stay: 12-14 days OT Intensity: Minimum of 1-2 x/day, 45 to 90 minutes OT Frequency: 5 out of 7 days OT Duration/Estimated Length of Stay: 12-14 days         Team Interventions: Nursing Interventions Patient/Family Education, Skin Care/Wound Management  PT interventions Ambulation/gait training, Disease management/prevention, Pain management, Stair training, Wheelchair propulsion/positioning, Therapeutic Activities, Patient/family education, Fish farm managerDME/adaptive equipment instruction, Warden/rangerBalance/vestibular training, Cognitive remediation/compensation, Psychosocial support,  Therapeutic Exercise, UE/LE Strength taining/ROM, Skin care/wound management, Functional mobility training, Community reintegration, Discharge planning, Neuromuscular re-education, Splinting/orthotics, UE/LE Coordination activities  OT Interventions Warden/rangerBalance/vestibular training, Cognitive remediation/compensation, Community reintegration, Discharge planning, Functional mobility training, DME/adaptive equipment instruction, Psychosocial support, Pain management, Patient/family education, Self Care/advanced ADL retraining, Therapeutic Activities, Therapeutic Exercise, UE/LE Strength taining/ROM, UE/LE Coordination activities, Wheelchair propulsion/positioning  SLP Interventions    TR Interventions    SW/CM Interventions      Team Discharge Planning: Destination: PT-Home ,OT- Home , SLP-Home Projected Follow-up: PT-Outpatient PT, Home health PT (TBD upon discharge), OT-  Other (comment) (TBD), SLP-None Projected Equipment Needs: PT-Standard walker, 3 in 1 bedside comode, OT- None recommended by OT, SLP-None recommended by SLP Equipment Details: PT-[facility] and standard walker; DME recommendations TBD upon discharge, OT-  Patient/family involved in discharge planning: PT- Patient, Family Adult nursemember/caregiver,  OT-Patient, Family member/caregiver, SLP-Patient, Family member/caregiver  MD ELOS: 12-14 days Medical Rehab Prognosis:  Excellent Assessment: The patient has been admitted for CIR therapies with the diagnosis of critical illness myopathy. The team will be addressing functional mobility, strength, stamina, balance, safety, adaptive techniques and equipment, self-care, bowel and bladder mgt, patient and caregiver education, respiratory mgt/trach wean, pain mgt, pacing of activities, ego support, community reintegration. Goals have been set at mod I for mobility and self-care.    Ranelle OysterZachary T. Swartz, MD, FAAPMR      See Team Conference Notes for weekly updates to the plan of care

## 2014-11-22 NOTE — Progress Notes (Signed)
[facility] Progress [facility]     Subjective: Asked to come back to see his wound.  Nurse concerned he has significant slough and exposed sutures.  Objective: Vital signs in last 24 hours: Temp:  [98.1 F (36.7 C)] 98.1 F (36.7 C) (02/04 1257) Pulse Rate:  [100-107] 106 (02/05 1133) Resp:  [16-18] 16 (02/05 1133) BP: (104-126)/(71-72) 126/72 mmHg (02/04 2153) SpO2:  [95 %-96 %] 96 % (02/05 1133) Last BM Date: 11/22/14 (patient has ostomy)  Intake/Output from previous day: 02/04 0701 - 02/05 0700 In: 390 [P.O.:360; I.V.:30] Out: 1600 [Urine:1600] Intake/Output this shift:    PE: Gen:  Alert, NAD, pleasant Abd: Soft, NT/ND, +BS, no HSM, midline wound open has significant improved in size the superior portion is completely closed, the wound is still to a depth of 5cm with exposed fascial suture.  No fascial dehiscence or extravasation.  No signs of infection or purulent or feculent drainage.  Superficial yellow slough noted at the periphery of the wound bed.  Lab Results:   Recent Labs  11/21/14 1540 11/22/14 0628  WBC 10.7* 10.9*  HGB 12.9* 12.9*  HCT 39.6 39.4  PLT 386 355   BMET  Recent Labs  11/21/14 1540 11/22/14 0628  NA  --  139  K  --  4.0  CL  --  104  CO2  --  28  GLUCOSE  --  110*  BUN  --  14  CREATININE 1.15 1.10  CALCIUM  --  9.0   PT/INR No results for input(s): LABPROT, INR in the last 72 hours. CMP     Component Value Date/Time   NA 139 11/22/2014 0628   K 4.0 11/22/2014 0628   CL 104 11/22/2014 0628   CO2 28 11/22/2014 0628   GLUCOSE 110* 11/22/2014 0628   BUN 14 11/22/2014 0628   CREATININE 1.10 11/22/2014 0628   CALCIUM 9.0 11/22/2014 0628   PROT 6.2 11/22/2014 0628   ALBUMIN 3.1* 11/22/2014 0628   AST 19 11/22/2014 0628   ALT 21 11/22/2014 0628   ALKPHOS 60 11/22/2014 0628   BILITOT 0.5 11/22/2014 0628   GFRNONAA 71* 11/22/2014 0628   GFRAA 82* 11/22/2014 0628   Lipase     Component Value Date/Time   LIPASE 23  10/27/2014 1810       Studies/Results: No results found.  Anti-infectives: Anti-infectives    None       Assessment/Plan 1. Sigmoid diverticulitis, Perforated Bowel 1 month post op from Ex lap, repair of umbilical hernia, partial sigmoid colectomy and colostomy 2. Sepsis with hypotension/requiring pressors post op 3. Bacteroides bacteremia from left maxillary sinus disease/rescue cooling for temp 103, 11/08/14  4. Ventilator dependant respiratory failure/EXTUBATED/RE intubated 11/02/14, /tracheostomy1/22/16/ resolving pneumonia/ off Vent 11/16/14. 5. OSA on CPAP at home. 6. Hx of tobacco use 30 years (quit 9 years ago) 7. Hx of hypertension/ 8. Hypothyroid TSH 16 9. Malnutrition- Now on pureed diet. 10. Poor healing of the abdominal wound; opened on 11/13/14 11. Body mass index is 38.51 kg  12. Hx of nephrolithiasis  Plan: 1.  Continue WD saline moistened Kerlix gauze BID 2.  Bowel function is doing well 3.  He will be PRN over the weekend and we can see again on Monday if he's still here in rehab 4.  Routine ostomy care    LOS: 1 day    Aris GeorgiaDORT, Shanti Agresti 11/22/2014, 12:49 PM Pager: [phone]

## 2014-11-22 NOTE — Evaluation (Signed)
Physical Therapy Assessment and Plan  Patient Details  Name: William [name] MRN: [id number] Date of Birth: [birth year]  PT Diagnosis: Abnormality of gait, Muscle weakness and Pain in trach site and abdomen Rehab Potential: Good ELOS: 12-14 days   Today's Date: 11/22/2014 PT Individual Time: 0800-0900  PT Individual Time Calculation (min): 60 min   Problem List:  Patient Active Problem List   Diagnosis Date Noted  . 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 S/P colostomy 11/21/2014  . Hypothyroidism 11/20/2014  . Essential hypertension 11/20/2014  . Protein-calorie malnutrition 11/20/2014  . Abscess of abdominal cavity   . Acute on chronic respiratory failure 11/19/2014  . Tracheostomy status 11/19/2014  . Acute respiratory failure   . Tracheostomy, acute management 11/15/2014  . Fever 11/12/2014  . Rash 11/12/2014  . Abscess of suppurative peritonitis 11/12/2014  . Bacteroides fragilis infection 11/12/2014  . Normocytic anemia 11/12/2014  . Obesity 11/12/2014  . Shock circulatory 10/28/2014  . Acute respiratory failure with hypoxia 10/28/2014  . Acute post-operative pain 10/28/2014  . Solitary pulmonary nodule on lung CT 10/28/2014  . Sleep apnea 10/28/2014  . Diverticulitis of colon with perforation s/p colectomy/colostomy 10/28/2014 10/27/2014    Past Medical History:  Past Medical History  Diagnosis Date  . Renal disorder     kidney stones  . Hypertension   . Thyroid disease   . Hypothyroidism 11/20/2014  . Essential hypertension 11/20/2014  . Protein-calorie malnutrition 11/20/2014  . COPD (chronic obstructive pulmonary disease)    Past Surgical History:  Past Surgical History  Procedure Laterality Date  . Partial colectomy N/A 10/28/2014    Procedure: PARTIAL COLECTOMY Sigmoid;  Surgeon: Autumn Messing III, MD;  Location: [facility];  Service: General;  Laterality: N/A;  . Colostomy N/A 10/28/2014    Procedure: COLOSTOMY;  Surgeon: Autumn Messing III, MD;  Location: [facility];   Service: General;  Laterality: N/A;  . Laparotomy N/A 10/28/2014    Procedure: EXPLORATORY LAPAROTOMY;  Surgeon: Autumn Messing III, MD;  Location: [facility];  Service: General;  Laterality: N/A;  . Tracheostomy tube placement N/A 11/08/2014    Procedure: TRACHEOSTOMY;  Surgeon: Melida Quitter, MD;  Location: [facility];  Service: ENT;  Laterality: N/A;    Assessment & Plan Clinical Impression: William [name] is a 64 y.o. right handed male former smoker/OSA admitted 10/27/2014 with diffuse abdominal pain worse in the left lower quadrant and low-grade fever. Patient independent prior to admission living with his wife. CT of chest/ abdomen and imaging revealed significant sigmoid diverticulitis with contained perforation as well as incidental findings of 8 mm right lower lobe nodule. Underwent partial colectomy, colostomy with exploratory laparotomy and repair of umbilical hernia 17/61/6073 per Dr. Marlou Starks. Maintained on broad-spectrum antibiotics and transitioned to intravenous Levaquin until 11/21/2014 and stopped. Postoperative hypotension/ARDS/healthcare associated pneumonia with critical care medicine follow-up and aggressive ventilatory support failed extubation 11/02/2014 and ultimately required tracheostomy 11/08/2014 per Dr. Redmond Baseman and has been off ventilator since 11/14/2014. Patient completed course of Levaquin and metronidazole 11/21/2014 for healthcare associated pneumonia and followed by infectious disease... Wound care nurse follow-up for colostomy care and education. TNA initiated for nutritional support and died advanced to a dysphagia 1 thin liquid. Bouts of confusion and agitation cranial CT scan negative for any acute changes. Subcutaneous Lovenox for DVT prophylaxis. Speech therapy follow-up currently with a #6 cuffless Shiley. Physical and occupational therapy evaluations are completed with slow progress. M.D. has requested physical medicine rehabilitation consult. Patient was admitted for comprehensive  rehabilitation program Patient transferred  to CIR on 11/21/2014 .   Patient currently requires min with mobility secondary to muscle weakness, decreased cardiorespiratoy endurance, decreased coordination and decreased standing balance, decreased postural control and decreased balance strategies.  Prior to hospitalization, patient was independent  with mobility and lived with Spouse in a House home.  Home access is 3Stairs to enter.  Patient will benefit from skilled PT intervention to maximize safe functional mobility, minimize fall risk and decrease caregiver burden for planned discharge home with 24 hour care from wife, however, anticipate patient will d/c at mod I level for all funcitonal mobility.  Anticipate patient will benefit from follow up OP at discharge.  PT - End of Session Activity Tolerance: Tolerates 30+ min activity with multiple rests Endurance Deficit: Yes Endurance Deficit Description: requires frequent rest breaks PT Assessment Rehab Potential (ACUTE/IP ONLY): Good PT Patient demonstrates impairments in the following area(s): Balance;Edema;Endurance;Motor;Pain;Safety;Skin Integrity PT Transfers Functional Problem(s): Bed Mobility;Bed to Chair;Car;Furniture PT Locomotion Functional Problem(s): Ambulation;Wheelchair Mobility;Stairs PT Plan PT Intensity: Minimum of 1-2 x/day ,45 to 90 minutes PT Frequency: 5 out of 7 days PT Duration Estimated Length of Stay: 12-14 days PT Treatment/Interventions: Ambulation/gait training;Disease management/prevention;Pain management;Stair training;Wheelchair propulsion/positioning;Therapeutic Activities;Patient/family education;DME/adaptive equipment instruction;Balance/vestibular training;Cognitive remediation/compensation;Psychosocial support;Therapeutic Exercise;UE/LE Strength taining/ROM;Skin care/wound management;Functional mobility training;Community reintegration;Discharge planning;Neuromuscular re-education;Splinting/orthotics;UE/LE  Coordination activities PT Transfers Anticipated Outcome(s): mod I PT Locomotion Anticipated Outcome(s): mod I community ambulation PT Recommendation Recommendations for Other Services: Speech consult Follow Up Recommendations: Outpatient PT;Home health PT (TBD upon discharge) Patient destination: Home Equipment Recommended: Standard walker;3 in 1 bedside comode Equipment Details: Patient owns [facility] and standard walker; DME recommendations TBD upon discharge  Skilled Therapeutic Intervention Skilled therapeutic intervention initiated after completion of evaluation. Discussed falls risk, safety within room, and focus of therapy during stay. Discussed possible LOS, goals, and f/u therapy.  PT Evaluation Precautions/Restrictions Precautions Precautions: Fall Restrictions Weight Bearing Restrictions: No General Chart Reviewed: Yes Family/Caregiver Present: Yes (wife)  Vital Signs Therapy Vitals Pulse Rate: (!) 106 Patient Position (if appropriate): Sitting Oxygen Therapy SpO2: 96 % O2 Device: Not Delivered Pain Pain Assessment Pain Assessment: No/denies pain Home Living/Prior Functioning Home Living Available Help at Discharge: Family;Available 24 hours/day Home Access: Stairs to enter [employer] of Steps: 3 Entrance Stairs-Rails: Right;Left;Can reach both Home Layout: One level Additional Comments: bathroom being remodeled and will be complete prior to discharge  Lives With: Spouse Prior Function Level of Independence: Independent with gait;Independent with transfers;Independent with basic ADLs;Independent with homemaking with ambulation  Able to Take Stairs?: Yes Driving: Yes Vocation: Full time employment 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 Leisure: Hobbies-yes (Comment) Comments: enjoys mechanical work and fixing things up Vision/Perception  Vision - Assessment Eye Alignment: Within Functional Limits Ocular Range of Motion: Within  Functional Limits Alignment/Gaze Preference: Within Defined Limits Tracking/Visual Pursuits: Able to track stimulus in all quads without difficulty Saccades: Within functional limits Convergence: Within functional limits  Cognition Overall Cognitive Status: Within Functional Limits for tasks assessed Orientation Level: Oriented X4 Attention: Selective Selective Attention: Appears intact Memory: Appears intact Awareness: Appears intact Problem Solving: Appears intact Safety/Judgment: Appears intact Sensation Sensation Light Touch: Appears Intact Hot/Cold: Appears Intact Proprioception: Appears Intact Coordination Gross Motor Movements are Fluid and Coordinated: Yes Fine Motor Movements are Fluid and Coordinated: No Motor  Motor Motor: Within Functional Limits  Mobility Bed Mobility Bed Mobility: Supine to Sit;Sit to Supine Supine to Sit: 5: Supervision;HOB elevated;With rails (HOB slightly elevated) Supine to Sit Details: Verbal cues for technique Supine to Sit Details (indicate  cue type and reason): Patient sleeps with HOB elevated at baseline Sit to Supine: 5: Supervision;With rail;HOB elevated Sit to Supine - Details: Verbal cues for technique Sit to Supine - Details (indicate cue type and reason): Patient sleeps with HOB elevated at baseline Transfers Transfers: Yes Sit to Stand: 4: Min assist;5: Supervision Sit to Stand Details: Verbal cues for sequencing;Verbal cues for technique;Verbal cues for precautions/safety;Tactile cues for weight shifting Stand to Sit: 4: Min assist;5: Supervision;To chair/3-in-1;To bed;With upper extremity assist;With armrests Stand to Sit Details (indicate cue type and reason): Verbal cues for precautions/safety;Verbal cues for technique;Verbal cues for sequencing;Tactile cues for weight shifting Stand Pivot Transfers: 4: Min assist (with and without RW) Stand Pivot Transfer Details: Verbal cues for sequencing;Tactile cues for weight  shifting;Verbal cues for precautions/safety;Verbal cues for technique Stand Pivot Transfer Details (indicate cue type and reason): performed with and without RW; patient requires minA  overall both techniques, however, much more antalgic, decreased weight shifts, and decreased pace without UE support of RW Locomotion  Ambulation Ambulation: Yes Ambulation/Gait Assistance: 4: Min assist Ambulation Distance (Feet): 174 Feet Assistive device: Rolling walker Ambulation/Gait Assistance Details: Verbal cues for gait pattern;Verbal cues for safe use of DME/AE;Tactile cues for posture;Visual cues for safe use of DME/AE Ambulation/Gait Assistance Details: 174' x1 and 155' x1 with RW and minA in controlled environment. Patient requires verbal/visual cues for maintaining BOS within RW Gait Gait: Yes Gait Pattern: Impaired Gait Pattern: Step-through pattern;Narrow base of support;Decreased stride length;Decreased hip/knee flexion - left;Decreased dorsiflexion - left Stairs / Additional Locomotion Stairs: Yes Stairs Assistance: 4: Min assist Stairs Assistance Details: Verbal cues for sequencing;Verbal cues for technique;Verbal cues for precautions/safety;Visual cues for safe use of DME/AE Stair Management Technique: Two rails;Step to pattern;Forwards Number of Stairs: 3 Height of Stairs: 5 Architect: Yes Wheelchair Assistance: 5: Investment banker, operational Details: Verbal cues for sequencing;Verbal cues for Marketing executive: Both upper extremities Wheelchair Parts Management: Needs assistance Distance: 120  Trunk/Postural Assessment  Cervical Assessment Cervical Assessment: Exceptions to [facility] (forward head posture) Thoracic Assessment Thoracic Assessment: Within Functional Limits Lumbar Assessment Lumbar Assessment: Within Functional Limits Postural Control Postural Control: Deficits on evaluation Righting Reactions: delayed Protective  Responses: delayed Postural Limitations: posterior pelvic tilt in sitting  Balance Balance Balance Assessed: Yes Dynamic Sitting Balance Dynamic Sitting - Balance Support: Feet supported;During functional activity Dynamic Sitting - Level of Assistance: 5: Stand by assistance Static Standing Balance Static Standing - Balance Support: Bilateral upper extremity supported Static Standing - Level of Assistance: 5: Stand by assistance Dynamic Standing Balance Dynamic Standing - Balance Support: During functional activity;No upper extremity supported Dynamic Standing - Level of Assistance: 4: Min assist Extremity Assessment  RUE Assessment RUE Assessment: Within Functional Limits (4/5 strength) LUE Assessment LUE Assessment: Within Functional Limits (4/5 strength ) RLE Assessment RLE Assessment: Within Functional Limits LLE Assessment LLE Assessment: Within Functional Limits  FIM:  FIM - Bed/Chair Transfer Bed/Chair Transfer Assistive Devices: Bed rails;HOB elevated;Arm rests;Walker Bed/Chair Transfer: 5: Supine > Sit: Supervision (verbal cues/safety issues);5: Sit > Supine: Supervision (verbal cues/safety issues);4: Bed > Chair or W/C: Min A (steadying Pt. > 75%);4: Chair or W/C > Bed: Min A (steadying Pt. > 75%) FIM - Locomotion: Wheelchair Distance: 120 Locomotion: Wheelchair: 2: Travels 50 - 149 ft with supervision, cueing or coaxing FIM - Locomotion: Ambulation Locomotion: Ambulation Assistive Devices: Administrator Ambulation/Gait Assistance: 4: Min assist Locomotion: Ambulation: 4: Travels 150 ft or more with minimal assistance (Pt.>75%) FIM - Locomotion: Stairs  Locomotion: Scientist, physiological: Insurance account manager - 2 Locomotion: Stairs: 1: Up and Down < 4 stairs with minimal assistance (Pt.>75%)   Refer to Care Plan for Long Term Goals  Recommendations for other services: None  Discharge Criteria: Patient will be discharged from PT if patient refuses treatment 3 consecutive  times without medical reason, if treatment goals not met, if there is a change in medical status, if patient makes no progress towards goals or if patient is discharged from hospital.  The above assessment, treatment plan, treatment alternatives and goals were discussed and mutually agreed upon: by patient and by family  Lillia Abed. Selin Eisler, PT, DPT 11/22/2014, 1:18 PM

## 2014-11-22 NOTE — Progress Notes (Signed)
[facility]     PROGRESS NOTE    Subjective/Complaints:   Objective: Vital Signs: Blood pressure 126/72, pulse 107, temperature 98.1 F (36.7 C), temperature source Oral, resp. rate 18, SpO2 95 %. No results found.  Recent Labs  11/21/14 1540 11/22/14 0628  WBC 10.7* 10.9*  HGB 12.9* 12.9*  HCT 39.6 39.4  PLT 386 355    Recent Labs  11/21/14 1540 11/22/14 0628  NA  --  139  K  --  4.0  CL  --  104  GLUCOSE  --  110*  BUN  --  14  CREATININE 1.15 1.10  CALCIUM  --  9.0   CBG (last 3)   Recent Labs  11/21/14 1558 11/21/14 2129 11/22/14 0645  GLUCAP 113* 118* 94    Wt Readings from Last 3 Encounters:  No data found for Wt    Physical Exam:  Constitutional: He is oriented to person, place, and time.   no acute distress , sitting EOB HENT:  Head: Normocephalic.  Eyes: EOM are normal.  Neck:  Tracheostomy tube #6 and capped Cardiovascular: Normal rate and regular rhythm.  Respiratory:  Decreased breath sounds at the bases but clear to auscultation  GI: Soft. Bowel sounds are normal.  Abdomen is non distended. Surgical site with heavy bulky dressing, midline W-D with good granulation tissue around edges no foul odor as well as colostomy bag  Musculoskeletal: He exhibits edema (bilateral LE).  Neurological: He is alert and oriented to person, place, and time.  Good insight and awareness. UES: 4/5 deltoid,bicep, tricep, wrist and hands 4+. LES: 3+/5 HF, 4/5 knees, 4/5 ankles. good sitting balance  Psychiatric: He has a normal mood and affect. His behavior is normal. Judgment and thought content normal    Assessment/Plan: 1. Functional deficits secondary to critical illness myopathy which require 3+ hours per day of interdisciplinary therapy in a comprehensive inpatient rehab setting. Physiatrist is providing close team supervision and 24 hour management of active medical problems listed below. Physiatrist  and rehab team continue to assess barriers to discharge/monitor patient progress toward functional and medical goals. FIM:                                 Medical Problem List and Plan: 1. 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 Status post partial colectomy, colostomy and exploratory laparotomy 10/28/2014 2. DVT Prophylaxis/Anticoagulation: Subcutaneous Lovenox.  3. Pain Management: Tylenol as needed 4. Postoperative ARDS/ventilatory support/HCAP. Status post tracheostomy 11/08/2014. Downsized to #6 cuffless Shiley trach Question   -trach is currently capped  -he is experimenting with different masks for CPAP---likes nasal mask the best so far  -if weekend uneventful, we'll likely dc trach on monday 5. Neuropsych: This patient is capable of making decisions on his own behalf. 6. Skin/Wound Care: Skin care as directed 7. Fluids/Electrolytes/Nutrition/decreased nutritional storage: encourage po  -labs are normal----can remove central line 8. Mood/postoperative confusion/restlessness. Cranial CT scan negative. Mental status much improved. Check sleep patterns 9. Hypothyroidism. Synthroid 10. Hypertension. Lopressor 25 mg twice a day 11. Incidental findings of 8 mm right lower lobe nodule on CT abdomen and pelvis. Follow-up as outpatient 12. Hyperglycemia. No reported history of diabetes mellitus. Currently on Lantus insulin 15 units daily----dc 13. Healthcare associated pneumonia.  Levaquin and metronidazole completed 11/21/2014  LOS (Days) 1 A FACE TO FACE EVALUATION WAS PERFORMED  Danel Studzinski T 11/22/2014 8:27  AM    

## 2014-11-22 NOTE — Care Management Note (Signed)
[facility] Individual Statement of Services  Patient Name:  William [name]  Date:  11/22/2014  Welcome to the [facility].  Our goal is to provide you with an individualized program based on your diagnosis and situation, designed to meet your specific needs.  With this comprehensive rehabilitation program, you will be expected to participate in at least 3 hours of rehabilitation therapies Monday-Friday, with modified therapy programming on the weekends.  Your rehabilitation program will include the following services:  Physical Therapy (PT), Occupational Therapy (OT), Speech Therapy (ST), 24 hour per day rehabilitation nursing, Therapeutic Recreaction (TR), Case Management (Social Worker), Rehabilitation Medicine, Nutrition Services and Pharmacy Services  Weekly team conferences will be held on Tuesdays to discuss your progress.  Your Social Worker will talk with you frequently to get your input and to update you on team discussions.  Team conferences with you and your family in attendance may also be held.  Expected length of stay: 12-14 days  Overall anticipated outcome: modified independent  Depending on your progress and recovery, your program may change. Your Social Worker will coordinate services and will keep you informed of any changes. Your Social Worker's name and contact numbers are listed  below.  The following services may also be recommended but are not provided by the [facility]:   Driving Evaluations  Home Health Rehabiltiation Services  Outpatient Rehabilitation Services  Vocational Rehabilitation   Arrangements will be made to provide these services after discharge if needed.  Arrangements include referral to agencies that provide these services.  Your insurance has been verified to be:  [facility] Your primary doctor is:  (in process of securing new primary care physician)  Pertinent information will be shared  with your doctor and your insurance company.  Social Worker:  [place] Freedom[place]cy Audie Wies[place], [place] [phone] or ([phone]) [phone]   Information discussed with and copy given to patient by: Amada JupiterHOYLE, Geraldine Tesar, 11/22/2014, 3:43 PM

## 2014-11-22 NOTE — Progress Notes (Signed)
INITIAL NUTRITION ASSESSMENT  DOCUMENTATION CODES Per approved criteria  -Obesity Unspecified   INTERVENTION: Continue Ensure Complete po BID, each supplement provides 350 kcal and 13 grams of protein.  Encourage adequate PO intake.  NUTRITION DIAGNOSIS: Increased nutrient needs related to therapy as evidenced by estimated nutrition needs.   Goal: Pt to meet >/= 90% of their estimated nutrition needs   Monitor:  PO intake, weight trends, labs, I/O's  Reason for Assessment: MST  64 y.o. male  Admitting Dx: Critical illness myopathy  ASSESSMENT: Pt former smoker/OSA admitted 10/27/2014 with diffuse abdominal pain worse in the left lower quadrant and low-grade fever. CT of chest/ abdomen and imaging revealed significant sigmoid diverticulitis with contained perforation as well as incidental findings of 8 mm right lower lobe nodule. Underwent partial colectomy, colostomy with exploratory laparotomy and repair of umbilical hernia 10/28/2014.   Pt is currently on a regular diet. Meal completion has been 100%. Pt reports having a good appetite. Pt does reports weight loss with usual body weight of 280 lbs. Noted pt with a 4.6% weight loss in 1 month. Pt currently has Ensure ordered and reports consuming them. Will continue with current orders. Pt was encouraged to eat his food at meals and to drink his supplements.   Pt with no observed significant fat or muscle mass loss.  Labs and medications reviewed.  Height: Ht Readings from Last 1 Encounters:  No data found for Ht  10/28/14 5' 11.6 (1.816 m)  Weight: Wt Readings from Last 1 Encounters:  No data found for Wt  11/17/14 267 lbs 10/27/14 280 lbs  Ideal Body Weight: 176 lbs  % Ideal Body Weight: 152%  Wt Readings from Last 10 Encounters:  No data found for Wt    Usual Body Weight: 280 lbs  % Usual Body Weight: 95%  BMI:  Body Mass Index: 37.45 kg/(m^2) Class II obesity  Estimated Nutritional Needs: Kcal: 2200-2450   Protein: 125-145 grams Fluid: 2.2 - 2.45 L/day  Skin: Incision on abdomen and neck  Diet Order: Diet regular  EDUCATION NEEDS: -No education needs identified at this time   Intake/Output Summary (Last 24 hours) at 11/22/14 1042 Last data filed at 11/22/14 16100635  Gross per 24 hour  Intake    390 ml  Output   1600 ml  Net  -1210 ml    Last BM: Ostomy- 2/5  Labs:   Recent Labs Lab 11/17/14 0457 11/18/14 0430 11/21/14 1540 11/22/14 0628  NA 141 139  --  139  K 3.5 3.7  --  4.0  CL 107 104  --  104  CO2 27 26  --  28  BUN 32* 26*  --  14  CREATININE 0.75 0.84 1.15 1.10  CALCIUM 8.8 8.8  --  9.0  MG 2.1  --   --   --   PHOS 3.1  --   --   --   GLUCOSE 148* 105*  --  110*    CBG (last 3)   Recent Labs  11/21/14 1558 11/21/14 2129 11/22/14 0645  GLUCAP 113* 118* 94    Scheduled Meds: . alteplase  2 mg Intracatheter Once  . alteplase  2 mg Intracatheter Once  . chlorhexidine  15 mL Mouth Rinse BID  . collagenase   Topical BID  . enoxaparin (LOVENOX) injection  40 mg Subcutaneous Q24H  . feeding supplement (ENSURE COMPLETE)  237 mL Oral BID BM  . furosemide  20 mg Oral Daily  . insulin  aspart  0-20 Units Subcutaneous TID WC  . levothyroxine  150 mcg Oral QAC breakfast  . metoprolol tartrate  25 mg Oral BID  . nystatin   Topical BID  . pantoprazole sodium  40 mg Oral Daily  . saccharomyces boulardii  250 mg Oral BID    Continuous Infusions:   Past Medical History  Diagnosis Date  . Renal disorder     kidney stones  . Hypertension   . Thyroid disease   . Hypothyroidism 11/20/2014  . Essential hypertension 11/20/2014  . Protein-calorie malnutrition 11/20/2014  . COPD (chronic obstructive pulmonary disease)     Past Surgical History  Procedure Laterality Date  . Partial colectomy N/A 10/28/2014    Procedure: PARTIAL COLECTOMY Sigmoid;  Surgeon: [name], MD;  Location: [facility];  Service: General;  Laterality: N/A;  . Colostomy N/A 10/28/2014     Procedure: COLOSTOMY;  Surgeon: [name], MD;  Location: [facility];  Service: General;  Laterality: N/A;  . Laparotomy N/A 10/28/2014    Procedure: EXPLORATORY LAPAROTOMY;  Surgeon: [name], MD;  Location: [facility];  Service: General;  Laterality: N/A;  . Tracheostomy tube placement N/A 11/08/2014    Procedure: TRACHEOSTOMY;  Surgeon: [name], MD;  Location: [facility];  Service: ENT;  Laterality: N/A;    Marijean Niemann, MS, RD, LDN Pager # [phone] After hours/ weekend pager # [phone]

## 2014-11-22 NOTE — Evaluation (Signed)
Speech Language Pathology Assessment and Plan  Patient Details  Name: William [name] MRN: [id number] Date of Birth: 1950/12/12  SLP Diagnosis: N/A Rehab Potential: N/A ELOS: N/A   Today's Date: 11/22/2014 SLP Individual Time: 0865-7846 SLP Individual Time Calculation (min): 60 min   Problem List:  Patient Active Problem List   Diagnosis Date Noted  . 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 S/P colostomy 11/21/2014  . Hypothyroidism 11/20/2014  . Essential hypertension 11/20/2014  . Protein-calorie malnutrition 11/20/2014  . Abscess of abdominal cavity   . Acute on chronic respiratory failure 11/19/2014  . Tracheostomy status 11/19/2014  . Acute respiratory failure   . Tracheostomy, acute management 11/15/2014  . Fever 11/12/2014  . Rash 11/12/2014  . Abscess of suppurative peritonitis 11/12/2014  . Bacteroides fragilis infection 11/12/2014  . Normocytic anemia 11/12/2014  . Obesity 11/12/2014  . Shock circulatory 10/28/2014  . Acute respiratory failure with hypoxia 10/28/2014  . Acute post-operative pain 10/28/2014  . Solitary pulmonary nodule on lung CT 10/28/2014  . Sleep apnea 10/28/2014  . Diverticulitis of colon with perforation s/p colectomy/colostomy 10/28/2014 10/27/2014   Past Medical History:  Past Medical History  Diagnosis Date  . Renal disorder     kidney stones  . Hypertension   . Thyroid disease   . Hypothyroidism 11/20/2014  . Essential hypertension 11/20/2014  . Protein-calorie malnutrition 11/20/2014  . COPD (chronic obstructive pulmonary disease)    Past Surgical History:  Past Surgical History  Procedure Laterality Date  . Partial colectomy N/A 10/28/2014    Procedure: PARTIAL COLECTOMY Sigmoid;  Surgeon: Autumn Messing III, MD;  Location: [facility];  Service: General;  Laterality: N/A;  . Colostomy N/A 10/28/2014    Procedure: COLOSTOMY;  Surgeon: Autumn Messing III, MD;  Location: [facility];  Service: General;  Laterality: N/A;  . Laparotomy N/A 10/28/2014   Procedure: EXPLORATORY LAPAROTOMY;  Surgeon: Autumn Messing III, MD;  Location: [facility];  Service: General;  Laterality: N/A;  . Tracheostomy tube placement N/A 11/08/2014    Procedure: TRACHEOSTOMY;  Surgeon: Melida Quitter, MD;  Location: [facility];  Service: ENT;  Laterality: N/A;    Assessment / Plan / Recommendation Clinical Impression Patient is a 64 y.o. right handed male former smoker/OSA admitted 10/27/2014 with diffuse abdominal pain worse in the left lower quadrant and low-grade fever. Patient independent prior to admission living with his wife. CT of chest/ abdomen and imaging revealed significant sigmoid diverticulitis with contained perforation as well as incidental findings of 8 mm right lower lobe nodule. Underwent partial colectomy, colostomy with exploratory laparotomy and repair of umbilical hernia 96/29/5284 per Dr. Marlou Starks. Maintained on broad-spectrum antibiotics and transitioned to intravenous Levaquin until 11/21/2014 and stopped. Postoperative hypotension/ARDS/healthcare associated pneumonia with critical care medicine follow-up and aggressive ventilatory support failed extubation 11/02/2014 and ultimately required tracheostomy 11/08/2014 per Dr. Redmond Baseman and has been off ventilator since 11/14/2014. Patient completed course of Levaquin and metronidazole 11/21/2014 for healthcare associated pneumonia and followed by infectious disease... Wound care nurse follow-up for colostomy care and education. TNA initiated for nutritional support and diet advanced to regular textures with thin liquids. Bouts of confusion and agitation cranial CT scan negative for any acute changes. Subcutaneous Lovenox for DVT prophylaxis. Currently with a #6 cuffless Shiley. Physical and occupational therapy evaluations are completed with slow progress. M.D. has requested physical medicine rehabilitation consult. Patient was admitted for comprehensive rehabilitation program. Patient transferred to CIR on 11/21/2014 and administered  the MoCA. Patient scored a 24/30 with a score of 26  or above considered normal. Patient demonstrated most difficulty with basic mathematical task, however, both the patient and wife report that this is baseline and patient uses a calculator for "everything." No other cognitive-linguistic deficits were noted throughout the session and both the patient and his wife report that the patient is at his cognitive baseline. Patient also observed drinking thin liquids via straw without overt s/s of aspiration. Patient currently has a #6 cuffless trach that is plugged. Patient demonstrated efficient vocal intensity and all vitals remained WFL throughout session, therefore, skilled SLP intervention is not needed at this time. All education complete and both the patient and his wife agreed with recommendations.   Skilled Therapeutic Interventions          Administered a cognitive-linguistic evaluation along with the MoCA. Please see above for details. Patient and his wife educated on current cognitive-linguistic function and both verbalized understanding of all information presented.   SLP Assessment  Patient does not need any further Speech Lanaguage Pathology Services    Recommendations  Oral Care Recommendations: Oral care BID Patient destination: Home Follow up Recommendations: None Equipment Recommended: None recommended by SLP    SLP Frequency N/A  SLP Treatment/Interventions N/A   Pain Pain Assessment Pain Assessment: No/denies pain Prior Functioning  Lives With: Spouse Available Help at Discharge: Family;Available 24 hours/day Vocation: Full time employment  Short Term Goals: N/A  See FIM for current functional status Refer to Care Plan for Long Term Goals  Recommendations for other services: None  Discharge Criteria: Patient will be discharged from SLP if patient refuses treatment 3 consecutive times without medical reason, if treatment goals not met, if there is a change in medical status,  if patient makes no progress towards goals or if patient is discharged from hospital.  The above assessment, treatment plan, treatment alternatives and goals were discussed and mutually agreed upon: by patient and by family  Nahlia Hellmann 11/22/2014, 3:37 PM

## 2014-11-22 NOTE — Progress Notes (Signed)
Social Work  Social Work Assessment and Plan  Patient Details  Name: William OuJoseph Doshier MRN: [id number] Date of Birth: 08/05/1951  Today's Date: 11/22/2014  Problem List:  Patient Active Problem List   Diagnosis Date Noted  . 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 S/P colostomy 11/21/2014  . Hypothyroidism 11/20/2014  . Essential hypertension 11/20/2014  . Protein-calorie malnutrition 11/20/2014  . Abscess of abdominal cavity   . Acute on chronic respiratory failure 11/19/2014  . Tracheostomy status 11/19/2014  . Acute respiratory failure   . Tracheostomy, acute management 11/15/2014  . Fever 11/12/2014  . Rash 11/12/2014  . Abscess of suppurative peritonitis 11/12/2014  . Bacteroides fragilis infection 11/12/2014  . Normocytic anemia 11/12/2014  . Obesity 11/12/2014  . Shock circulatory 10/28/2014  . Acute respiratory failure with hypoxia 10/28/2014  . Acute post-operative pain 10/28/2014  . Solitary pulmonary nodule on lung CT 10/28/2014  . Sleep apnea 10/28/2014  . Diverticulitis of colon with perforation s/p colectomy/colostomy 10/28/2014 10/27/2014   Past Medical History:  Past Medical History  Diagnosis Date  . Renal disorder     kidney stones  . Hypertension   . Thyroid disease   . Hypothyroidism 11/20/2014  . Essential hypertension 11/20/2014  . Protein-calorie malnutrition 11/20/2014  . COPD (chronic obstructive pulmonary disease)    Past Surgical History:  Past Surgical History  Procedure Laterality Date  . Partial colectomy N/A 10/28/2014    Procedure: PARTIAL COLECTOMY Sigmoid;  Surgeon: Chevis PrettyPaul Toth III, MD;  Location: [facility];  Service: General;  Laterality: N/A;  . Colostomy N/A 10/28/2014    Procedure: COLOSTOMY;  Surgeon: Chevis PrettyPaul Toth III, MD;  Location: [facility];  Service: General;  Laterality: N/A;  . Laparotomy N/A 10/28/2014    Procedure: EXPLORATORY LAPAROTOMY;  Surgeon: Chevis PrettyPaul Toth III, MD;  Location: [facility];  Service: General;  Laterality: N/A;  . Tracheostomy  tube placement N/A 11/08/2014    Procedure: TRACHEOSTOMY;  Surgeon: Christia Readingwight Bates, MD;  Location: [facility];  Service: ENT;  Laterality: N/A;   Social History:  reports that he has quit smoking. He does not have any smokeless tobacco history on file. He reports that he does not drink alcohol or use illicit drugs.  Family / Support Systems Marital Status: Married How Long?: 13 yrs Patient Roles: Spouse, Parent Spouse/Significant Other: wife, Clyda HurdleBeth Bia @ [phone](C) [phone] Children: wife's 62 yr old son living with them but this is a significant stressor to them and they are planning to have him evicted. Anticipated Caregiver: wife Clyda HurdleBeth Mey is disabled from an arm injury but anticipates she will be able to assist pt. as his condition requires Ability/Limitations of Caregiver: old right arm injury  Caregiver Availability: 24/7 Family Dynamics: As noted, pt and wife describe their marriage as still "in the honeymoon phase".  Wife very supportive.  She admits that she feels terrible about the amount her son has placed on pt and their home over the past 9 yrs he has lived there.  They have plans to evict him from the home.  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 He and wife enjoy talking about the "day dates" and "just getting away from it all..."  Pt denies any significant emotional distress currently except with  stressors at home with step-son. Recent Psychosocial Issues: Stressors with step-son at home. Pyschiatric History: None Substance Abuse History: None  Patient / Family Perceptions, Expectations & Goals Pt/Family understanding of illness & functional limitations: Pt and wife with good understanding of the multiple medical issues and of current functional limitations/ need for CIR. Premorbid pt/family roles/activities: Pt was completely independent and working 7 days/ week Anticipated changes in roles/activities/participation: Goals set for mod ind.  Wife may need to provide some assistance but should be minimal. Pt/family expectations/goals: "I just hope I can get back to work."  [employer]: None Premorbid Home Care/DME Agencies: None Transportation available at discharge: yes  Discharge Planning Living Arrangements: Spouse/significant other, Children Support Systems: Spouse/significant other Type of Residence: Private residence Civil engineer, contracting: Media planner (specify) Education officer, museum) Financial Resources: Employment Surveyor, quantity Screen Referred: No Living Expenses: Database administrator Management: Patient Does the patient have any problems obtaining your medications?: No Home Management: pt and wife Patient/Family Preliminary Plans: Pt to return home with wife as primary support. Social Work Anticipated Follow Up Needs: HH/OP Expected length of stay: 12-14 days  Clinical Impression Very pleasant gentleman here following acute, critical illness and now very deconditioned.  Wife very supportive and able to provide 24/7 support.  Both speak of stressors in the home with their son and plan to make changes once pt returns home.  Denies any significant emotional distress.  Will follow for support and d/c planning needs.  Ryoma Nofziger 11/22/2014, 3:41 PM

## 2014-11-22 NOTE — Evaluation (Signed)
Occupational Therapy Assessment and Plan  Patient Details  Name: Lynard Postlewait MRN: [id number] Date of Birth: Sep 13, 1951  OT Diagnosis: abnormal posture, muscle weakness (generalized) and decreased balance Rehab Potential: Rehab Potential (ACUTE ONLY): Excellent ELOS: 12-14 days   Today's Date: 11/22/2014 OT Individual Time: 1030-1130 OT Individual Time Calculation (min): 60 min     Problem List:  Patient Active Problem List   Diagnosis Date Noted  . 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 S/P colostomy 11/21/2014  . Hypothyroidism 11/20/2014  . Essential hypertension 11/20/2014  . Protein-calorie malnutrition 11/20/2014  . Abscess of abdominal cavity   . Acute on chronic respiratory failure 11/19/2014  . Tracheostomy status 11/19/2014  . Acute respiratory failure   . Tracheostomy, acute management 11/15/2014  . Fever 11/12/2014  . Rash 11/12/2014  . Abscess of suppurative peritonitis 11/12/2014  . Bacteroides fragilis infection 11/12/2014  . Normocytic anemia 11/12/2014  . Obesity 11/12/2014  . Shock circulatory 10/28/2014  . Acute respiratory failure with hypoxia 10/28/2014  . Acute post-operative pain 10/28/2014  . Solitary pulmonary nodule on lung CT 10/28/2014  . Sleep apnea 10/28/2014  . Diverticulitis of colon with perforation s/p colectomy/colostomy 10/28/2014 10/27/2014    Past Medical History:  Past Medical History  Diagnosis Date  . Renal disorder     kidney stones  . Hypertension   . Thyroid disease   . Hypothyroidism 11/20/2014  . Essential hypertension 11/20/2014  . Protein-calorie malnutrition 11/20/2014  . COPD (chronic obstructive pulmonary disease)    Past Surgical History:  Past Surgical History  Procedure Laterality Date  . Partial colectomy N/A 10/28/2014    Procedure: PARTIAL COLECTOMY Sigmoid;  Surgeon: Autumn Messing III, MD;  Location: [facility];  Service: General;  Laterality: N/A;  . Colostomy N/A 10/28/2014    Procedure: COLOSTOMY;  Surgeon: Autumn Messing III, MD;  Location: [facility];  Service: General;  Laterality: N/A;  . Laparotomy N/A 10/28/2014    Procedure: EXPLORATORY LAPAROTOMY;  Surgeon: Autumn Messing III, MD;  Location: [facility];  Service: General;  Laterality: N/A;  . Tracheostomy tube placement N/A 11/08/2014    Procedure: TRACHEOSTOMY;  Surgeon: Melida Quitter, MD;  Location: [facility];  Service: ENT;  Laterality: N/A;    Assessment & Plan Clinical Impression: Nathan Moctezuma is a 64 y.o. right handed male former smoker/OSA admitted 10/27/2014 with diffuse abdominal pain worse in the left lower quadrant and low-grade fever. Patient independent prior to admission living with his wife. CT of chest/ abdomen and imaging revealed significant sigmoid diverticulitis with contained perforation as well as incidental findings of 8 mm right lower lobe nodule. Underwent partial colectomy, colostomy with exploratory laparotomy and repair of umbilical hernia 00/92/3300 per Dr. Marlou Starks. Maintained on broad-spectrum antibiotics and transitioned to intravenous Levaquin until 11/21/2014 and stopped. Postoperative hypotension/ARDS/healthcare associated pneumonia with critical care medicine follow-up and aggressive ventilatory support failed extubation 11/02/2014 and ultimately required tracheostomy 11/08/2014 per Dr. Redmond Baseman and has been off ventilator since 11/14/2014. Patient completed course of Levaquin and metronidazole 11/21/2014 for healthcare associated pneumonia and followed by infectious disease... Wound care nurse follow-up for colostomy care and education. TNA initiated for nutritional support and died advanced to a dysphagia 1 thin liquid. Bouts of confusion and agitation cranial CT scan negative for any acute changes. Subcutaneous Lovenox for DVT prophylaxis. Speech therapy follow-up currently with a #6 cufflessShiley. Physical and occupational therapy evaluations are completed with slow progress. M.D. has requested physical medicine rehabilitation consult. Patient  transferred to CIR on 11/21/2014 .  Patient currently requires min-mod assist with basic self-care skills and functional transfers secondary to muscle weakness, decreased cardiorespiratoy endurance and decreased standing balance, decreased postural control and decreased balance strategies.  Prior to hospitalization, patient could complete BADLs and IADLs with independent .  Patient will benefit from skilled intervention to increase independence with basic self-care skills and increase level of independence with iADL prior to discharge home with care partner.  Anticipate patient will require no supervision secondary to modified independent goals and follow-up OT to be determined.  OT - End of Session Activity Tolerance: Decreased this session Endurance Deficit: Yes Endurance Deficit Description: requires frequent rest breaks OT Assessment Rehab Potential (ACUTE ONLY): Excellent OT Patient demonstrates impairments in the following area(s): Balance;Endurance;Motor;Safety;Pain OT Basic ADL's Functional Problem(s): Grooming;Eating;Bathing;Dressing;Toileting OT Advanced ADL's Functional Problem(s): Simple Meal Preparation OT Transfers Functional Problem(s): Toilet;Tub/Shower OT Additional Impairment(s): None OT Plan OT Intensity: Minimum of 1-2 x/day, 45 to 90 minutes OT Frequency: 5 out of 7 days OT Duration/Estimated Length of Stay: 12-14 days OT Treatment/Interventions: Balance/vestibular training;Cognitive remediation/compensation;Community reintegration;Discharge planning;Functional mobility training;DME/adaptive equipment instruction;Psychosocial support;Pain management;Patient/family education;Self Care/advanced ADL retraining;Therapeutic Activities;Therapeutic Exercise;UE/LE Strength taining/ROM;UE/LE Coordination activities;Wheelchair propulsion/positioning OT Self Feeding Anticipated Outcome(s): Mod I OT Basic Self-Care Anticipated Outcome(s): Mod I OT Toileting Anticipated Outcome(s):  Mod I OT Bathroom Transfers Anticipated Outcome(s): Mod I OT Recommendation Patient destination: Home Follow Up Recommendations: Other (comment) (TBD) Equipment Recommended: None recommended by OT   Skilled Therapeutic Intervention OT eval completed. Discussed role of OT, goals of therapy, possible ELOS, safety plan, fall risk, and follow-up. Pt received sitting EOB and wife present and eager for bathing. Ambulated short distance to sink at min A using RW. Engaged in bathing at sink with min A for standing balance and min cues for hand placement during sit<>stand. Pt required dressing change during bathing, therefore transferred back to bed at min A level and RN notified. RN completed dressing change then therapist continued session. Completed dressing sitting EOB with max A overall and min A for standing balance. Pt left sitting EOB to eat lunch with wife present.   OT Evaluation Precautions/Restrictions  Precautions Precautions: Fall Restrictions Weight Bearing Restrictions: No General   Vital Signs Therapy Vitals Pulse Rate: (!) 106 Resp: 16 Patient Position (if appropriate): Lying Oxygen Therapy SpO2: 96 % O2 Device: Not Delivered Pain Pain Assessment Pain Assessment: No/denies pain Pain Score: 2  Pain Type: Surgical pain Pain Location: Throat Pain Orientation: Mid Pain Descriptors / Indicators: Tender Pain Onset: On-going Pain Intervention(s): RN made aware;Repositioned;Ambulation/increased activity Multiple Pain Sites: No Home Living/Prior Functioning Home Living Family/patient expects to be discharged to:: Private residence Living Arrangements: Spouse/significant other Available Help at Discharge: Family, Available 24 hours/day Type of Home: House Home Access: Stairs to enter [employer] of Steps: 3 Entrance Stairs-Rails: Right, Left, Can reach both Home Layout: One level Additional Comments: bathroom being remodeled and will be complete prior to  discharge  Lives With: Spouse Prior Function Level of Independence: Independent with gait, Independent with transfers, Independent with basic ADLs, Independent with homemaking with ambulation  Able to Take Stairs?: Yes Driving: Yes Vocation: Full time employment 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 Leisure: Hobbies-yes (Comment) Comments: enjoys mechanical work and fixing things up ADL   Vision/Perception  Vision- History Baseline Vision/History: No visual deficits Patient Visual Report: No change from baseline Vision- Assessment Vision Assessment?: Yes Eye Alignment: Within Functional Limits Ocular Range of Motion: Within Functional Limits Alignment/Gaze Preference: Within Defined Limits Tracking/Visual Pursuits: Able to track  stimulus in all quads without difficulty Saccades: Within functional limits Convergence: Within functional limits Visual Fields: No apparent deficits  Cognition Overall Cognitive Status: Within Functional Limits for tasks assessed Orientation Level: Oriented X4 Attention: Selective Selective Attention: Appears intact Memory: Appears intact Awareness: Appears intact Problem Solving: Appears intact Safety/Judgment: Appears intact Sensation Sensation Light Touch: Appears Intact Hot/Cold: Appears Intact Proprioception: Appears Intact Additional Comments: Proprioception intact B ankles Coordination Gross Motor Movements are Fluid and Coordinated: Yes Fine Motor Movements are Fluid and Coordinated: No Finger Nose Finger Test: 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 Motor  Motor Motor: Within Functional Limits Mobility  Bed Mobility Bed Mobility: Supine to Sit;Sit to Supine Supine to Sit: 5: Supervision Supine to Sit Details: Verbal cues for technique Sit to Supine: 5: Supervision Sit to Supine - Details: Verbal cues for technique Transfers Transfers: Sit to Stand;Stand to Sit Sit to Stand: 4: Min assist;5:  Supervision Sit to Stand Details: Tactile cues for weight shifting;Manual facilitation for weight shifting Stand to Sit: 4: Min assist;5: Supervision Stand to Sit Details (indicate cue type and reason): Manual facilitation for weight shifting;Tactile cues for weight shifting  Trunk/Postural Assessment  Cervical Assessment Cervical Assessment: Exceptions to [facility] (forward head posture) Thoracic Assessment Thoracic Assessment: Within Functional Limits Lumbar Assessment Lumbar Assessment: Within Functional Limits Postural Control Postural Control: Deficits on evaluation Righting Reactions: delayed Protective Responses: delayed Postural Limitations: posterior pelvic tilt in sitting  Balance Balance Balance Assessed: Yes Dynamic Sitting Balance Dynamic Sitting - Balance Support: Feet supported;During functional activity Dynamic Sitting - Level of Assistance: 5: Stand by assistance Static Standing Balance Static Standing - Balance Support: Bilateral upper extremity supported Static Standing - Level of Assistance: 5: Stand by assistance Dynamic Standing Balance Dynamic Standing - Balance Support: During functional activity;No upper extremity supported Dynamic Standing - Level of Assistance: 4: Min assist Extremity/Trunk Assessment RUE Assessment RUE Assessment: Within Functional Limits (4/5 strength) LUE Assessment LUE Assessment: Within Functional Limits (4/5 strength )  FIM:  FIM - Bed/Chair Transfer Bed/Chair Transfer Assistive Devices: Bed rails;HOB elevated;Arm rests;Walker Bed/Chair Transfer: 5: Supine > Sit: Supervision (verbal cues/safety issues);5: Sit > Supine: Supervision (verbal cues/safety issues);4: Bed > Chair or W/C: Min A (steadying Pt. > 75%);4: Chair or W/C > Bed: Min A (steadying Pt. > 75%)   Refer to Care Plan for Long Term Goals  Recommendations for other services: None  Discharge Criteria: Patient will be discharged from OT if patient refuses treatment 3  consecutive times without medical reason, if treatment goals not met, if there is a change in medical status, if patient makes no progress towards goals or if patient is discharged from hospital.  The above assessment, treatment plan, treatment alternatives and goals were discussed and mutually agreed upon: by patient and by family  Duayne Cal 11/22/2014, 11:50 AM

## 2014-11-23 ENCOUNTER — Encounter (HOSPITAL_COMMUNITY): Payer: 59 | Admitting: Occupational Therapy

## 2014-11-23 ENCOUNTER — Inpatient Hospital Stay (HOSPITAL_COMMUNITY): Payer: 59 | Admitting: *Deleted

## 2014-11-23 ENCOUNTER — Inpatient Hospital Stay (HOSPITAL_COMMUNITY): Payer: 59 | Admitting: Occupational Therapy

## 2014-11-23 LAB — GLUCOSE, CAPILLARY
GLUCOSE-CAPILLARY: 108 mg/dL — AB (ref 70–99)
GLUCOSE-CAPILLARY: 117 mg/dL — AB (ref 70–99)
Glucose-Capillary: 89 mg/dL (ref 70–99)
Glucose-Capillary: 102 mg/dL — ABNORMAL HIGH (ref 70–99)

## 2014-11-23 NOTE — Progress Notes (Signed)
Patient resting comfortably on CPAP via nasal mask, 12.0 cm H20.

## 2014-11-23 NOTE — Significant Event (Signed)
Patient called at 0645 stated that he needed to see respiratory. RN called respiratory and went to patients room to find trach dislodged. Patient stated "I coughed it right out". Vital signs were stable. Spo2 was at 96% patient was talking sitting in chair. Before becoming dislodged trach had been capped for several days discannulation was scheduled for Monday. Respiratory was unable to replace trach. Vasolin dressing was applied. Patient left sitting in chair eating breakfast talking with wife. Will continue to monitor. Cindee SaltMcBride,Emilyrose Darrah K, RN

## 2014-11-23 NOTE — Progress Notes (Signed)
Physical Therapy Note  Patient Details  Name: William [name] MRN: [id number] Date of Birth: 04/20/1951 Today's Date: 11/23/2014  Patient missed 30 minutes of skilled physical therapy this PM secondary to ostomy bag care being performed by nursing at time of scheduled therapy. Will follow up as able.   Zella RicherBridget S Ayad Nieman S. Eddy Termine, PT, DPT 11/23/2014, 4:32 PM

## 2014-11-23 NOTE — Progress Notes (Signed)
RT Note:  Called to patients room due to trach becoming dislodged.  RT unable to replace trach.  Patient is scheduled for decannulation on Monday.  Patient stable on 21% at this time. Spo2 96% HR 101.  No distress noted. RN at bedside.

## 2014-11-23 NOTE — Progress Notes (Signed)
Occupational Therapy Session Note  Patient Details  Name: William [name] MRN: [id number] Date of Birth: 06/30/1951  Today's Date: 11/23/2014 OT Individual Time: 1400-1430 OT Individual Time Calculation (min): 30 min    Short Term Goals: Week 1:  OT Short Term Goal 1 (Week 1): Pt will complete toilet transfer at supervision level OT Short Term Goal 2 (Week 1): Pt will complete LB dressing at supervision level OT Short Term Goal 3 (Week 1): Pt will complete bathing at supervision level OT Short Term Goal 4 (Week 1): Pt will stand for 3 min at supervision level during functional activity   Skilled Therapeutic Interventions/Progress Updates:    1:1 self care retraining  Focus on functional ambulation with RW to bathroom and voiding in standing with instructional cues on RW safety and positioning of RW with voiding in standing. Pt able to ambulate to gym but had an elevated HR after activity in 120s. Practiced stepping over shower stall threshold with and without RW forwards and backwards in prep for new shower situation at home.  Also continued to focus on activity tolerance and higher level balance with functional ambulation with out RW.  Pt required rest breaks in between activity to let HR come back down in lower 100s instead of 120s. Pt reports fatigue but can continue. Pt left in the room in w/c- self propelling in the room.   Therapy Documentation Precautions:  Precautions Precautions: Fall Restrictions Weight Bearing Restrictions: No General:   Vital Signs: Therapy Vitals Temp: 97.7 F (36.5 C) Temp Source: Oral Pulse Rate: (!) 122 Resp: 19 BP: 104/84 mmHg Patient Position (if appropriate): Sitting Oxygen Therapy SpO2: 99 % O2 Device: Not Delivered Pain: Pain Assessment Pain Assessment: No/denies pain Pain Score: 0-No pain  See FIM for current functional status  Therapy/Group: Individual Therapy  [name], [facility] 11/23/2014, 3:09 PM

## 2014-11-23 NOTE — Progress Notes (Addendum)
Stoma CK done. Pt has white gauze over stoma, no drainage. Pt vitals stable sats 95% on RA, HR 110, RR 18. Pt talking no SOB.

## 2014-11-23 NOTE — Progress Notes (Signed)
Physical Therapy Session Note  Patient Details  Name: William OuJoseph Jasperson MRN: [id number] Date of Birth: 11/11/1950  Today's Date: 11/23/2014 PT Individual Time: 1100-1200 PT Individual Time Calculation (min): 60 min   Short Term Goals: Week 1:  PT Short Term Goal 1 (Week 1): STGs=LTGs  Skilled Therapeutic Interventions/Progress Updates:  Patient received sitting in recliner. Session focused on functional transfers, gait training, and administration of BERG Balance Test, TUG, and Five Times Sit to Stand Test (FTSS); see details below. Patient returned to room and left sitting in recliner with all needs within reach and wife present.  Five Times Sit to Stand Test (FTSS) Method: Use a straight back chair with a solid seat that is 16-18" high. Ask participant to sit on the chair with arms folded across their chest.   Instructions: "Stand up and sit down as quickly as possible 5 times, keeping your arms folded across your chest."   Measurement: Stop timing when the participant stands the 5th time.  TIME: __15.77____ (in seconds)  Times > 13.6 seconds is associated with increased disability and morbidity (Guralnik, 2000) Times > 15 seconds is predictive of recurrent falls in healthy individuals aged 62 and older (Buatois, et al., 2008) Normal performance values in community dwelling individuals aged 62 and older (Bohannon, 2006): o 60-69 years: 11.4 seconds o 70-79 years: 12.6 seconds o 80-89 years: 14.8 seconds  MCID: ? 2.3 seconds for Vestibular Disorders Wray Kearns(Meretta, 2006)  Therapy Documentation Precautions:  Precautions Precautions: Fall Restrictions Weight Bearing Restrictions: No Pain: Pain Assessment Pain Assessment: No/denies pain Pain Score: 0-No pain Locomotion : Ambulation Ambulation/Gait Assistance: 4: Min guard;5: Supervision  Balance: Balance Balance Assessed: Yes Standardized Balance Assessment Standardized Balance Assessment: Berg Balance Test;Timed Up and Go  Test Berg Balance Test Sit to Stand: Able to stand without using hands and stabilize independently Standing Unsupported: Able to stand 2 minutes with supervision Sitting with Back Unsupported but Feet Supported on Floor or Stool: Able to sit safely and securely 2 minutes Stand to Sit: Sits safely with minimal use of hands Transfers: Able to transfer safely, definite need of hands Standing Unsupported with Eyes Closed: Able to stand 10 seconds with supervision Standing Ubsupported with Feet Together: Able to place feet together independently and stand for 1 minute with supervision From Standing, Reach Forward with Outstretched Arm: Can reach forward >12 cm safely (5") From Standing Position, Pick up Object from Floor: Able to pick up shoe, needs supervision From Standing Position, Turn to Look Behind Over each Shoulder: Looks behind from both sides and weight shifts well Turn 360 Degrees: Able to turn 360 degrees safely but slowly Standing Unsupported, Alternately Place Feet on Step/Stool: Able to complete >2 steps/needs minimal assist Standing Unsupported, One Foot in Front: Able to plae foot ahead of the other independently and hold 30 seconds Standing on One Leg: Tries to lift leg/unable to hold 3 seconds but remains standing independently Total Score: 41/56, indicating patient is at significant risk for falls (>80%). Timed Up and Go Test TUG: Normal TUG Normal TUG (seconds): 20.41 (average of three trials), scores >13.5" indicate high risk for falls. See FIM for current functional status  Therapy/Group: Individual Therapy  Chipper HerbBridget S Sundee Garland S. Carlia Bomkamp, PT, DPT 11/23/2014, 12:19 PM

## 2014-11-23 NOTE — Progress Notes (Signed)
Occupational Therapy Session Note  Patient Details  Name: William [name] MRN: [id number] Date of Birth: 07/17/1951  Today's Date: 11/23/2014 OT Individual Time: 0800-0900 OT Individual Time Calculation (min): 60 min    Short Term Goals: Week 1:  OT Short Term Goal 1 (Week 1): Pt will complete toilet transfer at supervision level OT Short Term Goal 2 (Week 1): Pt will complete LB dressing at supervision level OT Short Term Goal 3 (Week 1): Pt will complete bathing at supervision level OT Short Term Goal 4 (Week 1): Pt will stand for 3 min at supervision level during functional activity   Skilled Therapeutic Interventions/Progress Updates:    1:1 self care retraining at shower level. Pt was decannulated last night. All abdominal dressings and colostomy covered for safety.  Undressed sit to stand with steady A. Ambulated to bathroom with HHA with min to mod A; however pt heavily reliant on UEs (furniture walking) for support and with very narrow BOS and small steps.  Pt able to bathe with use of grab bars with sit to stand with steady A.  During functional task in standing without UE support, pt does LOB posteriorly but only requires min A to regain balance. Ambulated to sink with RW with minA but continued to demonstrate narrow BOS and short steps. Assistance given with trimming beard for bandage for trach stoma. Pt able to thread underwear and pants without AE with extra time.  He was also able to  [facility] in a modified sitting position with foot propped up on chair. A to tie shoes due to time. RN came in to dress wounds and pass meds. Left with wife and RN.   Therapy Documentation Precautions:  Precautions Precautions: Fall Restrictions Weight Bearing Restrictions: No Pain:  no reports of pain this am  See FIM for current functional status  Therapy/Group: Individual Therapy  [name], Erez [facility] 11/23/2014, 10:22 AM

## 2014-11-23 NOTE — Progress Notes (Signed)
[place] PHYSICAL MEDICINE & REHABILITATION     PROGRESS NOTE    Subjective/Complaints: Per RN, coughed up trach, RT could not replace, sats stable Objective: Vital Signs: Blood pressure 106/69, pulse 105, temperature 98.5 F (36.9 C), temperature source Oral, resp. rate 18, SpO2 93 %. No results found.  Recent Labs  11/21/14 1540 11/22/14 0628  WBC 10.7* 10.9*  HGB 12.9* 12.9*  HCT 39.6 39.4  PLT 386 355    Recent Labs  11/21/14 1540 11/22/14 0628  NA  --  139  K  --  4.0  CL  --  104  GLUCOSE  --  110*  BUN  --  14  CREATININE 1.15 1.10  CALCIUM  --  9.0   CBG (last 3)   Recent Labs  11/22/14 1628 11/22/14 2031 11/23/14 0647  GLUCAP 145* 114* 108*    Wt Readings from Last 3 Encounters:  No data found for Wt    Physical Exam:  Constitutional: He is oriented to person, place, and time.   no acute distress , sitting EOB HENT:  Head: Normocephalic.  Eyes: EOM are normal.  Neck:  Tracheostomy tube #6 and capped Cardiovascular: Normal rate and regular rhythm.  Respiratory:  Decreased breath sounds at the bases but clear to auscultation  GI: Soft. Bowel sounds are normal.  Abdomen is non distended. Surgical site with heavy bulky dressing, midline W-D with good granulation tissue around edges no foul odor as well as colostomy bag  Musculoskeletal: He exhibits edema (bilateral LE).  Neurological: He is alert and oriented to person, place, and time.  Good insight and awareness. UES: 4/5 deltoid,bicep, tricep, wrist and hands 4+. LES: 3+/5 HF, 4/5 knees, 4/5 ankles. good sitting balance  Psychiatric: He has a normal mood and affect. His behavior is normal. Judgment and thought content normal    Assessment/Plan: 1. Functional deficits secondary to critical illness myopathy which require 3+ hours per day of interdisciplinary therapy in a comprehensive inpatient rehab setting. Physiatrist is providing close team supervision and 24 hour  management of active medical problems listed below. Physiatrist and rehab team continue to assess barriers to discharge/monitor patient progress toward functional and medical goals. FIM: FIM - Bathing Bathing Steps Patient Completed: Chest, Right Arm, Front perineal area, Buttocks, Left Arm, Abdomen, Left upper leg, Right upper leg Bathing: 4: Min-Patient completes 8-9 2710f 10 parts or 75+ percent  FIM - Upper Body Dressing/Undressing Upper body dressing/undressing steps patient completed: Thread/unthread right sleeve of pullover shirt/dresss, Thread/unthread left sleeve of pullover shirt/dress, Put head through opening of pull over shirt/dress, Pull shirt over trunk Upper body dressing/undressing: 5: Set-up assist to: Obtain clothing/put away FIM - Lower Body Dressing/Undressing Lower body dressing/undressing steps patient completed: Thread/unthread right underwear leg, Pull underwear up/down Lower body dressing/undressing: 2: Max-Patient completed 25-49% of tasks     FIM - Diplomatic Services operational officerToilet Transfers Toilet Transfers Assistive Devices: Environmental consultantWalker, Therapist, musicGrab bars Toilet Transfers: 4-To toilet/BSC: Min A (steadying Pt. > 75%), 4-From toilet/BSC: Min A (steadying Pt. > 75%)  FIM - Bed/Chair Transfer Bed/Chair Transfer Assistive Devices: Bed rails, HOB elevated, Arm rests, Therapist, occupationalWalker Bed/Chair Transfer: 5: Supine > Sit: Supervision (verbal cues/safety issues), 5: Sit > Supine: Supervision (verbal cues/safety issues), 4: Bed > Chair or W/C: Min A (steadying Pt. > 75%), 4: Chair or W/C > Bed: Min A (steadying Pt. > 75%)  FIM - Locomotion: Wheelchair Distance: 120 Locomotion: Wheelchair: 2: Travels 50 - 149 ft with supervision, cueing or coaxing FIM - Locomotion: Ambulation Locomotion:  Ambulation Assistive Devices: Designer, industrial/product Ambulation/Gait Assistance: 4: Min assist Locomotion: Ambulation: 4: Travels 150 ft or more with minimal assistance (Pt.>75%)  Comprehension Comprehension Mode: Auditory Comprehension:  7-Follows complex conversation/direction: With no assist  Expression Expression Mode: Verbal Expression: 7-Expresses complex ideas: With no assist  Social Interaction Social Interaction: 7-Interacts appropriately with others - No medications needed.  Problem Solving Problem Solving: 6-Solves complex problems: With extra time  Memory Memory: 6-More than reasonable amt of time Medical Problem List and Plan: 1. 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 Status post partial colectomy, colostomy and exploratory laparotomy 10/28/2014 2. DVT Prophylaxis/Anticoagulation: Subcutaneous Lovenox.  3. Pain Management: Tylenol as needed 4. Postoperative ARDS/ventilatory support/HCAP. 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 Neuropsych: This patient is capable of making decisions on his own behalf. 6. Skin/Wound Care: Skin care as directed 7. Fluids/Electrolytes/Nutrition/decreased nutritional storage: encourage po  -labs are normal----can remove central line 8. Mood/postoperative confusion/restlessness. Cranial CT scan negative. Mental status much improved. Check sleep patterns 9. Hypothyroidism. Synthroid 10. Hypertension. Lopressor 25 mg twice a day 11. Incidental findings of 8 mm right lower lobe nodule on CT abdomen and pelvis. Follow-up as outpatient 12. Hyperglycemia. No reported history of diabetes mellitus. Currently on Lantus insulin 15 units daily----dc 13. Healthcare associated pneumonia.  Levaquin and metronidazole completed 11/21/2014- afeb  LOS (Days) 2 A FACE TO FACE EVALUATION WAS PERFORMED  Erick Colace 11/23/2014 8:37 AM

## 2014-11-24 ENCOUNTER — Inpatient Hospital Stay (HOSPITAL_COMMUNITY): Payer: 59 | Admitting: Physical Therapy

## 2014-11-24 LAB — GLUCOSE, CAPILLARY
GLUCOSE-CAPILLARY: 94 mg/dL (ref 70–99)
Glucose-Capillary: 107 mg/dL — ABNORMAL HIGH (ref 70–99)
Glucose-Capillary: 97 mg/dL (ref 70–99)

## 2014-11-24 NOTE — Progress Notes (Signed)
CPAP set-up at bedside on 12cm H20, with pt home nasal pillows. Pt states he can place himself on when ready. I told him to call if he needs further assistance. I also checked stoma, site is clean and dry BBS = clear. RT will continue to monitor

## 2014-11-24 NOTE — Progress Notes (Signed)
Stoma CK done, vitals stable, sats 92% on room air, gauze over stoma, pt talking with no SOB.

## 2014-11-24 NOTE — Progress Notes (Signed)
[place] PHYSICAL MEDICINE & REHABILITATION     PROGRESS NOTE    Subjective/Complaints: No breathing issues since tracheostomy out  Per nursing CBGs normal, I reviewed these, no history of diabetes, will DC CBGs as well as sliding scale insulin Objective: Vital Signs: Blood pressure 117/73, pulse 90, temperature 98.8 F (37.1 C), temperature source Oral, resp. rate 18, weight 119 kg (262 lb 5.6 oz), SpO2 96 %. No results found.  Recent Labs  11/21/14 1540 11/22/14 0628  WBC 10.7* 10.9*  HGB 12.9* 12.9*  HCT 39.6 39.4  PLT 386 355    Recent Labs  11/21/14 1540 11/22/14 0628  NA  --  139  K  --  4.0  CL  --  104  GLUCOSE  --  110*  BUN  --  14  CREATININE 1.15 1.10  CALCIUM  --  9.0   CBG (last 3)   Recent Labs  11/24/14 0011 11/24/14 0359 11/24/14 0650  GLUCAP 94 107* 97    Wt Readings from Last 3 Encounters:  11/24/14 119 kg (262 lb 5.6 oz)    Physical Exam:  Constitutional: He is oriented to person, place, and time.   no acute distress , sitting EOB HENT:  Head: Normocephalic.  Eyes: EOM are normal.  Neck:  Trach site without drainage Cardiovascular: Normal rate and regular rhythm.  Respiratory:  Decreased breath sounds at the bases but clear to auscultation  GI: Soft. Bowel sounds are normal.  Abdomen is non distended.  Musculoskeletal: He exhibits edema (bilateral LE).  Neurological: He is alert and oriented to person, place, and time.  Good insight and awareness. UES: 4/5 deltoid,bicep, tricep, wrist and hands 4+. LES: 3+/5 HF, 4/5 knees, 4/5 ankles. good sitting balance  Psychiatric: He has a normal mood and affect. His behavior is normal. Judgment and thought content normal    Assessment/Plan: 1. Functional deficits secondary to critical illness myopathy which require 3+ hours per day of interdisciplinary therapy in a comprehensive inpatient rehab setting. Physiatrist is providing close team supervision and 24 hour management  of active medical problems listed below. Physiatrist and rehab team continue to assess barriers to discharge/monitor patient progress toward functional and medical goals. Allow therapeutic grounds passes FIM: FIM - Bathing Bathing Steps Patient Completed: Chest, Right Arm, Front perineal area, Buttocks, Left Arm, Abdomen, Left upper leg, Right upper leg Bathing: 4: Min-Patient completes 8-9 49f 10 parts or 75+ percent  FIM - Upper Body Dressing/Undressing Upper body dressing/undressing steps patient completed: Thread/unthread right sleeve of pullover shirt/dresss, Thread/unthread left sleeve of pullover shirt/dress, Put head through opening of pull over shirt/dress, Pull shirt over trunk Upper body dressing/undressing: 5: Set-up assist to: Obtain clothing/put away FIM - Lower Body Dressing/Undressing Lower body dressing/undressing steps patient completed: Thread/unthread right underwear leg, Pull underwear up/down, Thread/unthread left underwear leg, Thread/unthread right pants leg, Thread/unthread left pants leg, Pull pants up/down, Don/Doff right sock, Don/Doff left sock, Don/Doff right shoe, Don/Doff left shoe Lower body dressing/undressing: 4: Min-Patient completed 75 plus % of tasks  FIM - Toileting Toileting steps completed by patient: Adjust clothing prior to toileting, Adjust clothing after toileting, Performs perineal hygiene Toileting: 4: Steadying assist  FIM - Diplomatic Services operational officer Devices: Environmental consultant, Therapist, music Transfers: 4-To toilet/BSC: Min A (steadying Pt. > 75%), 4-From toilet/BSC: Min A (steadying Pt. > 75%)  FIM - Banker Devices: Arm rests, Manufacturing systems engineer Transfer: 4: Bed > Chair or W/C: Min A (steadying Pt. > 75%),  4: Chair or W/C > Bed: Min A (steadying Pt. > 75%)  FIM - Locomotion: Wheelchair Distance: 120 Locomotion: Wheelchair: 0: Activity did not occur FIM - Locomotion: Ambulation Locomotion:  Ambulation Assistive Devices: Designer, industrial/productWalker - Rolling Ambulation/Gait Assistance: 4: Min guard, 5: Supervision Locomotion: Ambulation: 4: Travels 150 ft or more with minimal assistance (Pt.>75%)  Comprehension Comprehension Mode: Auditory Comprehension: 7-Follows complex conversation/direction: With no assist  Expression Expression Mode: Verbal Expression: 7-Expresses complex ideas: With no assist  Social Interaction Social Interaction: 7-Interacts appropriately with others - No medications needed.  Problem Solving Problem Solving: 6-Solves complex problems: With extra time  Memory Memory: 6-More than reasonable amt of time Medical Problem List and Plan: 1. 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 Status post partial colectomy, colostomy and exploratory laparotomy 10/28/2014 2. DVT Prophylaxis/Anticoagulation: Subcutaneous Lovenox.  3. Pain Management: Tylenol as needed 4. Postoperative ARDS/ventilatory support/HCAP. 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 Neuropsych: This patient is capable of making decisions on his own behalf. 6. Skin/Wound Care: Skin care as directed 7. Fluids/Electrolytes/Nutrition/decreased nutritional storage: encourage po  -labs are normal----can remove central line 8. Mood/postoperative confusion/restlessness. Cranial CT scan negative. Mental status much improved. Check sleep patterns 9. Hypothyroidism. Synthroid 10. Hypertension. Lopressor 25 mg twice a day 11. Incidental findings of 8 mm right lower lobe nodule on CT abdomen and pelvis. Follow-up as outpatient    LOS (Days) 3 A FACE TO FACE EVALUATION WAS PERFORMED  Claudette LawsKIRSTEINS,[place] 11/24/2014 10:24 AM

## 2014-11-24 NOTE — Progress Notes (Signed)
Physical Therapy Session Note  Patient Details  Name: William [name] MRN: [id number] Date of Birth: 11/18/1950  Today's Date: 11/24/2014 PT Individual Time: 0900-1000 PT Individual Time Calculation (min): 60 min   Short Term Goals: Week 1:  PT Short Term Goal 1 (Week 1): STGs=LTGs  Skilled Therapeutic Interventions/Progress Updates:  Pt was seen bedside in the pm. Pt propelled w/c to gym about 100 feet with B LEs and S. Pt performed multiple sit to stand transfers with rolling walker and S. Treatment in gym, focused on LE strengthening, pt performed cone taps, criss cross cone taps and alternating cone taps. Pt ambulated 150 feet with rolling walker and S. Pt ambulated with SPC, 90 feet x 2 with min A and verbal cues. Pt returned to room, propelling w/c with B LEs and S.   Therapy Documentation Precautions:  Precautions Precautions: Fall Restrictions Weight Bearing Restrictions: No General:   Pain: Pt c/o B knee soreness.    Locomotion : Ambulation Ambulation/Gait Assistance: 5: Supervision   See FIM for current functional status  Therapy/Group: Individual Therapy  William [name], William [name] 11/24/2014, 12:34 PM

## 2014-11-25 ENCOUNTER — Inpatient Hospital Stay (HOSPITAL_COMMUNITY): Payer: 59 | Admitting: *Deleted

## 2014-11-25 ENCOUNTER — Inpatient Hospital Stay (HOSPITAL_COMMUNITY): Payer: 59 | Admitting: Occupational Therapy

## 2014-11-25 MED ORDER — HYDROCORTISONE 1 % EX CREA
TOPICAL_CREAM | Freq: Two times a day (BID) | CUTANEOUS | Status: DC
  Administered 2014-11-25 – 2014-11-28 (×5): via TOPICAL
  Filled 2014-11-25: qty 28

## 2014-11-25 NOTE — Progress Notes (Signed)
Patient ID: William [name], male   DOB: 06/07/1951, 64 y.o.   MRN: [id number]     [facility]      [address]   [place], [place] [id number]    Phone: [phone] FAX: [phone]     Subjective: No issues.    Objective:  Vital signs:  Filed Vitals:   11/24/14 1409 11/24/14 2007 11/25/14 0442 11/25/14 0646  BP: 98/62 110/64 110/63   Pulse: 104 98 90   Temp: 97.6 F (36.4 C)  98.3 F (36.8 C)   TempSrc: Oral  Oral   Resp: 18  17   Weight:    262 lb 12.6 oz (119.2 kg)  SpO2: 95%  95%     Last BM Date: 11/24/14  Intake/Output   Yesterday:  02/07 0701 - 02/08 0700 In: 840 [P.O.:840] Out: 500 [Stool:500] This shift:  Total I/O In: 360 [P.O.:360] Out: -    Physical Exam: General: Pt awake/alert/oriented x4 in no acute distress Abdomen: Soft.  Nondistended. Non tender.  Some wound separation without wound dehiscence, small amount of fibrinous exudate wound edges, granulation tissue, packed with curlix.  Ostomy is pink and viable, functioning.     Problem List:   Principal Problem:   Critical illness myopathy Active Problems:   Tracheostomy status   S/P colostomy    Results:   Labs: Results for orders placed or performed during the hospital encounter of 11/21/14 (from the past 48 hour(s))  Glucose, capillary     Status: None   Collection Time: 11/23/14 12:19 PM  Result Value Ref Range   Glucose-Capillary 89 70 - 99 mg/dL  Glucose, capillary     Status: Abnormal   Collection Time: 11/23/14  4:33 PM  Result Value Ref Range   Glucose-Capillary 117 (H) 70 - 99 mg/dL   Comment 1 Notify RN   Glucose, capillary     Status: Abnormal   Collection Time: 11/23/14  9:01 PM  Result Value Ref Range   Glucose-Capillary 102 (H) 70 - 99 mg/dL  Glucose, capillary     Status: None   Collection Time: 11/24/14 12:11 AM  Result Value Ref Range   Glucose-Capillary 94 70 - 99 mg/dL  Glucose, capillary     Status: Abnormal   Collection Time: 11/24/14  3:59 AM  Result Value Ref Range   Glucose-Capillary 107 (H) 70 - 99 mg/dL  Glucose, capillary     Status: None   Collection Time: 11/24/14  6:50 AM  Result Value Ref Range   Glucose-Capillary 97 70 - 99 mg/dL    Imaging / Studies: No results found.  Medications / Allergies:  Scheduled Meds: . alteplase  2 mg Intracatheter Once  . alteplase  2 mg Intracatheter Once  . chlorhexidine  15 mL Mouth Rinse BID  . enoxaparin (LOVENOX) injection  40 mg Subcutaneous Q24H  . feeding supplement (ENSURE COMPLETE)  237 mL Oral BID BM  . furosemide  20 mg Oral Daily  . levothyroxine  150 mcg Oral QAC breakfast  . metoprolol tartrate  25 mg Oral BID  . nystatin   Topical BID  . pantoprazole sodium  40 mg Oral Daily  . saccharomyces boulardii  250 mg Oral BID   Continuous Infusions:  PRN Meds:.acetaminophen, ondansetron **OR** ondansetron (ZOFRAN) IV, sodium chloride, sorbitol  Antibiotics: Anti-infectives    None        Assessment/Plan Sigmoid diverticulitis, Perforated Bowel 1 month post op from Ex lap, repair of umbilical hernia,  partial sigmoid colectomy and colostomy -would is clean, continue with BID wet to dry dressing changes -surgery will s/o please call [facility] with questions or concerns.   Ashok Norris, [facility] Pager [phone]) For consults and floor pages call [phone](7A-4:30P)  11/25/2014 10:05 AM

## 2014-11-25 NOTE — Plan of Care (Signed)
Problem: RH BOWEL ELIMINATION Goal: RH STG MANAGE BOWEL WITH ASSISTANCE STG Manage Bowel with Mod.Assistance.And colostomy.  Outcome: Progressing Patient tries to assist staff with colostomy care Goal: RH OTHER STG BOWEL ELIMINATION GOALS W/ASSIST Pt. And family will be able to manage colostomy care at home.  Outcome: Progressing Patient and wife both eager to learn how to manage colostomy and do routine care   Problem: RH SKIN INTEGRITY Goal: RH OTHER STG SKIN INTEGRITY GOALS W/ASSIST Dressing changes on abd. Wound,and skin around ostomy care with mod. assist.  Outcome: Progressing Patient will try to assist with colostomy care. Staff performs wound care.

## 2014-11-25 NOTE — Progress Notes (Signed)
Occupational Therapy Session Note  Patient Details  Name: William [name] MRN: [id number] Date of Birth: 09/10/1951  Today's Date: 11/25/2014 OT Individual Time: 1000-1100 OT Individual Time Calculation (min): 60 min    Short Term Goals: Week 1:  OT Short Term Goal 1 (Week 1): Pt will complete toilet transfer at supervision level OT Short Term Goal 2 (Week 1): Pt will complete LB dressing at supervision level OT Short Term Goal 3 (Week 1): Pt will complete bathing at supervision level OT Short Term Goal 4 (Week 1): Pt will stand for 3 min at supervision level during functional activity   Skilled Therapeutic Interventions/Progress Updates:      Pt seen for BADL retraining of toileting, bathing, and dressing with a focus on activity tolerance, sit >< stand and balance with use of RW for support. Pt received sitting on EOB receiving nursing care. Pt then ambulated with RW to bathroom with S to stand in front of toilet. He then stepped into shower. Stoma site and abdominal region covered with plastic to allow pt to shower. He was able to bathe himself but did not bathe his feet thoroughly and needed A to dry. Recommended pt use a long sponge to wash and then sit on EOB to dry (with one foot propped on bed) or use hairdryer to dry feet.  In sitting EOB with one foot propped on bed, pt was able to don socks and shoes easily and tie them. He demonstrated good balance and control throughout the entire session, except for one descent into sitting which resulted in a flop on the bed.  Good activity tolerance today. Pt's PT arrived for his next session.  Therapy Documentation Precautions:  Precautions Precautions: Fall Restrictions Weight Bearing Restrictions: No    Pain: Pain Assessment Pain Assessment: No/denies pain ADL:  See FIM for current functional status  Therapy/Group: Individual Therapy  Dystany Duffy 11/25/2014, 11:37 AM

## 2014-11-25 NOTE — Progress Notes (Signed)
Patient information reviewed and entered into [facility] system by Jacora Hopkins, RN, CRRN, PPS Coordinator.  Information including medical coding and functional independence measure will be reviewed and updated through discharge.    

## 2014-11-25 NOTE — Progress Notes (Signed)
Pt states he can place himself on cpap when ready, encouraged to call with questions. Stoma site clean;dry.

## 2014-11-25 NOTE — Progress Notes (Signed)
[place] PHYSICAL MEDICINE & REHABILITATION     PROGRESS NOTE    Subjective/Complaints: Coughed out his trach--not replaced, no problems---dressed. Feeling well this morning  Per nursing CBGs normal, I reviewed these, no history of diabetes, will DC CBGs as well as sliding scale insulin Objective: Vital Signs: Blood pressure 110/63, pulse 90, temperature 98.3 F (36.8 C), temperature source Oral, resp. rate 17, weight 119.2 kg (262 lb 12.6 oz), SpO2 95 %. No results found. No results for input(s): WBC, HGB, HCT, PLT in the last 72 hours. No results for input(s): NA, K, CL, GLUCOSE, BUN, CREATININE, CALCIUM in the last 72 hours.  Invalid input(s): CO CBG (last 3)   Recent Labs  11/24/14 0011 11/24/14 0359 11/24/14 0650  GLUCAP 94 107* 97    Wt Readings from Last 3 Encounters:  11/25/14 119.2 kg (262 lb 12.6 oz)    Physical Exam:  Constitutional: He is oriented to person, place, and time.   no acute distress , sitting EOB HENT:  Head: Normocephalic.  Eyes: EOM are normal.  Neck:  Trach site dressed/ clean. No drainage Cardiovascular: Normal rate and regular rhythm.  Respiratory:  Decreased breath sounds at the bases but clear to auscultation  GI: Soft. Bowel sounds are normal.  Abdomen is non distended.  Musculoskeletal: He exhibits edema (bilateral LE).  Neurological: He is alert and oriented to person, place, and time.  Good insight and awareness. UES: 4/5 deltoid,bicep, tricep, wrist and hands 4+. LES: 3+/5 HF, 4/5 knees, 4/5 ankles. good sitting balance  Psychiatric: He has a normal mood and affect. His behavior is normal. Judgment and thought content normal    Assessment/Plan: 1. Functional deficits secondary to critical illness myopathy which require 3+ hours per day of interdisciplinary therapy in a comprehensive inpatient rehab setting. Physiatrist is providing close team supervision and 24 hour management of active medical problems listed  below. Physiatrist and rehab team continue to assess barriers to discharge/monitor patient progress toward functional and medical goals. Allow therapeutic grounds passes FIM: FIM - Bathing Bathing Steps Patient Completed: Chest, Right Arm, Front perineal area, Buttocks, Left Arm, Abdomen, Left upper leg, Right upper leg Bathing: 4: Min-Patient completes 8-9 3f 10 parts or 75+ percent  FIM - Upper Body Dressing/Undressing Upper body dressing/undressing steps patient completed: Thread/unthread right sleeve of pullover shirt/dresss, Thread/unthread left sleeve of pullover shirt/dress, Put head through opening of pull over shirt/dress, Pull shirt over trunk Upper body dressing/undressing: 5: Set-up assist to: Obtain clothing/put away FIM - Lower Body Dressing/Undressing Lower body dressing/undressing steps patient completed: Thread/unthread right underwear leg, Pull underwear up/down, Thread/unthread left underwear leg, Thread/unthread right pants leg, Thread/unthread left pants leg, Pull pants up/down, Don/Doff right sock, Don/Doff left sock, Don/Doff right shoe, Don/Doff left shoe Lower body dressing/undressing: 4: Min-Patient completed 75 plus % of tasks  FIM - Toileting Toileting steps completed by patient: Adjust clothing prior to toileting, Adjust clothing after toileting, Performs perineal hygiene Toileting: 4: Steadying assist  FIM - Diplomatic Services operational officer Devices: Environmental consultant, Therapist, music Transfers: 4-To toilet/BSC: Min A (steadying Pt. > 75%), 4-From toilet/BSC: Min A (steadying Pt. > 75%)  FIM - Banker Devices: Arm rests, Therapist, occupational: 4: Bed > Chair or W/C: Min A (steadying Pt. > 75%), 4: Chair or W/C > Bed: Min A (steadying Pt. > 75%)  FIM - Locomotion: Wheelchair Distance: 120 Locomotion: Wheelchair: 2: Travels 50 - 149 ft with supervision, cueing or coaxing FIM - Locomotion: Ambulation  Locomotion:  Ambulation Assistive Devices: Designer, industrial/productWalker - Rolling Ambulation/Gait Assistance: 5: Supervision Locomotion: Ambulation: 5: Travels 150 ft or more with supervision/safety issues  Comprehension Comprehension Mode: Auditory Comprehension: 7-Follows complex conversation/direction: With no assist  Expression Expression Mode: Verbal Expression: 7-Expresses complex ideas: With no assist  Social Interaction Social Interaction: 7-Interacts appropriately with others - No medications needed.  Problem Solving Problem Solving: 6-Solves complex problems: With extra time  Memory Memory: 6-More than reasonable amt of time Medical Problem List and Plan: 1. 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 Status post partial colectomy, colostomy and exploratory laparotomy 10/28/2014 2. DVT Prophylaxis/Anticoagulation: Subcutaneous Lovenox.  3. Pain Management: Tylenol as needed 4. Postoperative ARDS/ventilatory support/HCAP. 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 Neuropsych: This patient is capable of making decisions on his own behalf. 6. Skin/Wound Care: Skin care as directed 7. Fluids/Electrolytes/Nutrition/decreased nutritional storage: encourage po  -labs are normal--- 8. Mood/postoperative confusion/restlessness. Cranial CT scan negative. Mental status much improved. Check sleep patterns 9. Hypothyroidism. Synthroid 10. Hypertension. Lopressor 25 mg twice a day 11. Incidental findings of 8 mm right lower lobe nodule on CT abdomen and pelvis. Follow-up as outpatient    LOS (Days) 4 A FACE TO FACE EVALUATION WAS PERFORMED  Autumn Pruitt T 11/25/2014 8:35 AM

## 2014-11-25 NOTE — Progress Notes (Signed)
Physical Therapy Session Note  Patient Details  Name: William [name] MRN: [id number] Date of Birth: 04/23/1951  Today's Date: 11/25/2014 PT Individual Time: 1100-1200, 1420-1500, 1610-1630 PT Individual Time Calculation (min): 60 min, 40 min, and 20 min  Short Term Goals: Week 1:  PT Short Term Goal 1 (Week 1): STGs=LTGs  Skilled Therapeutic Interventions/Progress Updates:    AM Session: Patient received sitting EOB, finishing donning socks and shoes. Session focused on gait training with and without AD, functional transfers, car transfers, stair negotiation, and discharge planning. Patient performed 4 bouts of gait training >150' with RW and supervision, verbal cues to increased BOS and for safety with RW management during turns. Repeated sit<>stands without UE support x10, emphasis on slow, controlled eccentric descent from stand>sit. Stair negotiation x6 stairs with B handrails and supervision, patient able to recall proper sequencing for ascending,but requires min verbal cues for sequencing with descending; additional verbal cues for safety with foot placement.  Car transfer to simulated car set at lowest height. Patient performed via SLS method, but with B UE support (one hand on RW, one hand on car seat) with supervision. Returned to gym, patient performed gait training with RW 25' x6 with plank between B feet to facilitate increased BOS, supervision overall. Patient provided with stress ball for gross hand/grip strengthening due to c/o weakness. Patient returned to room and performed training with patient's wife for ambulation bed<>bathroom with RW and minA for safety. Patient and wife return demonstrate proper safety and therefore, checked off for wife to accompany patient to/from bathroom. Patient left sitting EOB with wife present.  PM Session: Patient received sitting in wheelchair. Session focused on gait training, curb/ramp negotiation, furniture transfers, and increasing activity  tolerance. Patient performed several bouts of gait training >150' with RW and supervision, cues for increasing BOS. NuStep Level 4 with B UE/LE 10' to increase activity tolerance, strength, and coordination. Functional ambulation on carpet and furniture transfers on low, cushioned surfaces on sofa in ADL apartment with RW and supervision. Curb/ramp negotiation with RW and supervision/verbal cues for proper sequencing. Patient returned to room and left sitting in wheelchair with wife present and all needs within reach.  PM Session #2: Patient received sitting in wheelchair. Session focused on gait training >150' x2 around unit on tile and carpet surfaces with emphasis on safety with stand>sit due to patient with several attempts not using hands to reach back and with uncontrolled descent. Patient returned to room and left sitting in wheelchair with all needs within reach and wife present.  Therapy Documentation Precautions:  Precautions Precautions: Fall Restrictions Weight Bearing Restrictions: No Pain: Pain Assessment Pain Assessment: No/denies pain Pain Score: 0-No pain Locomotion : Ambulation Ambulation/Gait Assistance: 5: Supervision   See FIM for current functional status  Therapy/Group: Individual Therapy  Chipper HerbBridget S Shaiann Mcmanamon S. Logyn Dedominicis, PT, DPT 11/25/2014, 12:10 PM

## 2014-11-26 ENCOUNTER — Inpatient Hospital Stay (HOSPITAL_COMMUNITY): Payer: 59

## 2014-11-26 ENCOUNTER — Inpatient Hospital Stay (HOSPITAL_COMMUNITY): Payer: 59 | Admitting: *Deleted

## 2014-11-26 NOTE — Progress Notes (Signed)
Physical Therapy Session Note  Patient Details  Name: William [name] MRN: [id number] Date of Birth: 11/02/1950  Today's Date: 11/26/2014 PT Individual Time: 0900-1000 PT Individual Time Calculation (min): 60 min   Short Term Goals: Week 1:  PT Short Term Goal 1 (Week 1): STGs=LTGs  Skilled Therapeutic Interventions/Progress Updates:    Patient received sitting EOB. Session focused on functional transfers, gait training, balance, and LE strengthening. Gait training >150'x2 with RW and distant supervision, improved/normalized BOS. NuStep Level 4 with B UE/LE x10' secondary to patient c/o stiffness. Initiated [place] exercises and provided handout for use during HEP: Standing with B UE support on raised table, heel raises, toe raises, hip abd, mini squats, knee flex x20 each; sitting LAQ with 5" hold x20; all performed with supervision.  Stair negotiation 6 stairs with B handrails and supervision and step to pattern; 6 stairs with one handrail, negotiating sideways with step to pattern and supervision, cues for foot placement. Patient returned to room and left sitting EOB with wife present and all needs within reach.  Therapy Documentation Precautions:  Precautions Precautions: Fall Restrictions Weight Bearing Restrictions: No Pain: Pain Assessment Pain Assessment: 0-10 Pain Score: 6  Pain Type: Chronic pain Pain Location: Knee Pain Orientation: Right;Left Pain Descriptors / Indicators: Squeezing;Stabbing Pain Onset: On-going Pain Intervention(s): RN made aware;Repositioned;Ambulation/increased activity Multiple Pain Sites: No Locomotion : Ambulation Ambulation/Gait Assistance: 5: Supervision   See FIM for current functional status  Therapy/Group: Individual Therapy  Chipper HerbBridget S Dawnielle Christiana S. Colette Dicamillo, PT, DPT 11/26/2014, 10:08 AM

## 2014-11-26 NOTE — Plan of Care (Signed)
Problem: Consults Goal: Colostomy/Ileostomy Education (See Patient Education module for education specifics.) Pt's wife will be able to complete colostomy care independently  Wound/Ostomy RN expend time with Pt. And his wife doing the ostomy teaching and practice.

## 2014-11-26 NOTE — Progress Notes (Signed)
PHYSICAL MEDICINE & REHABILITATION     PROGRESS NOTE    Subjective/Complaints: Had some "sweats" and felt warm last night which is more normal for him. Feeling well this am  Per nursing CBGs normal, I reviewed these, no history of diabetes, will DC CBGs as well as sliding scale insulin Objective: Vital Signs: Blood pressure 108/73, pulse 17, temperature 99 F (37.2 C), temperature source Oral, resp. rate 17, weight 120.1 kg (264 lb 12.4 oz), SpO2 98 %. No results found. No results for input(s): WBC, HGB, HCT, PLT in the last 72 hours. No results for input(s): NA, K, CL, GLUCOSE, BUN, CREATININE, CALCIUM in the last 72 hours.  Invalid input(s): CO CBG (last 3)   Recent Labs  11/24/14 0011 11/24/14 0359 11/24/14 0650  GLUCAP 94 107* 97    Wt Readings from Last 3 Encounters:  11/26/14 120.1 kg (264 lb 12.4 oz)    Physical Exam:  Constitutional: He is oriented to person, place, and time.   no acute distress , sitting EOB HENT:  Head: Normocephalic.  Eyes: EOM are normal.  Neck:  Trach site closing. Cardiovascular: Normal rate and regular rhythm.  Respiratory:  Decreased breath sounds at the bases but clear to auscultation  GI: Soft. Bowel sounds are normal.  Abdomen is non distended.  Musculoskeletal: He exhibits edema (bilateral LE).  Neurological: He is alert and oriented to person, place, and time.  Good insight and awareness. UES: 4/5 deltoid,bicep, tricep, wrist and hands 4+. LES: 3+/5 HF, 4/5 knees, 4/5 ankles. good sitting balance  Psychiatric: He has a normal mood and affect. His behavior is normal. Judgment and thought content normal    Assessment/Plan: 1. Functional deficits secondary to critical illness myopathy which require 3+ hours per day of interdisciplinary therapy in a comprehensive inpatient rehab setting. Physiatrist is providing close team supervision and 24 hour management of active medical problems listed  below. Physiatrist and rehab team continue to assess barriers to discharge/monitor patient progress toward functional and medical goals. Allow therapeutic grounds passes FIM: FIM - Bathing Bathing Steps Patient Completed: Chest, Right Arm, Front perineal area, Buttocks, Left Arm, Abdomen, Left upper leg, Right upper leg Bathing: 4: Min-Patient completes 8-9 459f 10 parts or 75+ percent  FIM - Upper Body Dressing/Undressing Upper body dressing/undressing steps patient completed: Thread/unthread right sleeve of pullover shirt/dresss, Thread/unthread left sleeve of pullover shirt/dress, Put head through opening of pull over shirt/dress, Pull shirt over trunk Upper body dressing/undressing: 5: Set-up assist to: Obtain clothing/put away FIM - Lower Body Dressing/Undressing Lower body dressing/undressing steps patient completed: Thread/unthread right underwear leg, Pull underwear up/down, Thread/unthread left underwear leg, Thread/unthread right pants leg, Thread/unthread left pants leg, Pull pants up/down, Don/Doff right sock, Don/Doff left sock, Don/Doff right shoe, Don/Doff left shoe, Fasten/unfasten right shoe, Fasten/unfasten left shoe Lower body dressing/undressing: 5: Set-up assist to: Obtain clothing  FIM - Toileting Toileting steps completed by patient: Adjust clothing prior to toileting, Adjust clothing after toileting, Performs perineal hygiene Toileting: 5: Supervision: Safety issues/verbal cues  FIM - Diplomatic Services operational officerToilet Transfers Toilet Transfers Assistive Devices: Environmental consultantWalker, Therapist, musicGrab bars Toilet Transfers: 5-To toilet/BSC: Supervision (verbal cues/safety issues), 5-From toilet/BSC: Supervision (verbal cues/safety issues)  FIM - BankerBed/Chair Transfer Bed/Chair Transfer Assistive Devices: Arm rests, Therapist, occupationalWalker Bed/Chair Transfer: 5: Bed > Chair or W/C: Supervision (verbal cues/safety issues), 5: Chair or W/C > Bed: Supervision (verbal cues/safety issues)  FIM - Locomotion: Wheelchair Distance: 120 Locomotion:  Wheelchair: 0: Activity did not occur FIM - Locomotion: Ambulation Locomotion: Health visitorAmbulation Assistive  Devices: Designer, industrial/product Ambulation/Gait Assistance: 5: Supervision Locomotion: Ambulation: 5: Travels 150 ft or more with supervision/safety issues  Comprehension Comprehension Mode: Auditory Comprehension: 7-Follows complex conversation/direction: With no assist  Expression Expression Mode: Verbal Expression: 7-Expresses complex ideas: With no assist  Social Interaction Social Interaction: 7-Interacts appropriately with others - No medications needed.  Problem Solving Problem Solving: 6-Solves complex problems: With extra time  Memory Memory: 6-More than reasonable amt of time Medical Problem List and Plan: 1. 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 Status post partial colectomy, colostomy and exploratory laparotomy 10/28/2014 2. DVT Prophylaxis/Anticoagulation: Subcutaneous Lovenox.  3. Pain Management: Tylenol as needed 4. Postoperative ARDS/ventilatory support/HCAP. 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 Neuropsych: This patient is capable of making decisions on his own behalf. 6. Skin/Wound Care: Skin care as directed 7. Fluids/Electrolytes/Nutrition/decreased nutritional storage: encourage po  -labs are normal--- 8. Mood/postoperative confusion/restlessness. Cranial CT scan negative. Mental status much improved. Check sleep patterns 9. Hypothyroidism. Synthroid 10. Hypertension. Lopressor 25 mg twice a day 11. Incidental findings of 8 mm right lower lobe nodule on CT abdomen and pelvis. Follow-up as outpatient    LOS (Days) 5 A FACE TO FACE EVALUATION WAS PERFORMED  Shah Insley T 11/26/2014 8:09 AM

## 2014-11-26 NOTE — Progress Notes (Signed)
Patient has CPAP set up and ready by bedside.  Patient places himself on when ready.  Patient aware to call if needs further assistance.

## 2014-11-26 NOTE — Progress Notes (Signed)
Physical Therapy Make Up Session Note  Patient Details  Name: William OuJoseph Weinand MRN: [id number] Date of Birth: 05/02/1951  Today's Date: 11/26/2014 PT Individual Time: 1100-1130 (make up session) PT Individual Time Calculation (min): 30 min (make up session)  Short Term Goals: Week 1:  PT Short Term Goal 1 (Week 1): STGs=LTGs  Skilled Therapeutic Interventions/Progress Updates:    Make Up Session: Patient received sitting EOB. Session focused on functional transfers, gait training, and trunk/core NMR and stretching. Patient performing all transfers and gait training >150' x2 with RW and supervision. Once in therapy gym, heat applied to patient's L knee for remainder of session to improve pain level. Repeated sit<>stands without UE support 2x10 with supervision. Inverted T towel stretch with elevated mat: Wedge positioned with inverted T towels at lumbar horizontally and at thoracic vertically to promote extension at lumbar and thoracic spine and at pecs; elevated mat to facilitate B hip flexor stretch. Patient returned to room and left sitting in wheelchair with wife present and all needs within reach.  Therapy Documentation Precautions:  Precautions Precautions: Fall Restrictions Weight Bearing Restrictions: No Pain: Pain Assessment Pain Assessment: 0-10 Pain Score: 6  Pain Type: Chronic pain Pain Location: Knee Pain Orientation: Right;Left Pain Descriptors / Indicators: Squeezing;Stabbing Pain Onset: On-going Pain Intervention(s): RN made aware;Repositioned;Ambulation/increased activity Multiple Pain Sites: No Locomotion : Ambulation Ambulation/Gait Assistance: 5: Supervision   See FIM for current functional status  Therapy/Group: Individual Therapy  Chipper HerbBridget S Tijah Hane S. [place] Dragoo, PT, DPT 11/26/2014, 11:42 AM

## 2014-11-26 NOTE — Progress Notes (Signed)
Occupational Therapy Session Note  Patient Details  Name: William [name] MRN: [id number] Date of Birth: 06/25/1951  Today's Date: 11/26/2014 OT Individual Time: 0700-0800 and 1330-1405 OT Individual Time Calculation (min): 60 min and 35 min     Short Term Goals: Week 1:  OT Short Term Goal 1 (Week 1): Pt will complete toilet transfer at supervision level OT Short Term Goal 2 (Week 1): Pt will complete LB dressing at supervision level OT Short Term Goal 3 (Week 1): Pt will complete bathing at supervision level OT Short Term Goal 4 (Week 1): Pt will stand for 3 min at supervision level during functional activity   Skilled Therapeutic Interventions/Progress Updates:    Session 1: Pt seen for ADL retraining with focus on standing balance, functional mobility, activity tolerance, and safety awareness. Pt received sitting in w/c eager for shower this AM. Therapist covered dressing and stoma site with plastic covering. Pt ambulated to bathroom at supervision level with use of RW and min cues for BOS. Pt completed bathing sit<>stand level with good safety awareness of being aware of water on floor prior to exiting bathroom. Ambulated to bed at supervision level then took rest break. Pt completed dressing sitting EOB at supervision and short rest breaks. Discussed energy conservation techniques with pt and wife. Pt left sitting EOB with breakfast and all needs in reach.   Session 2: Pt seen for 1:1 OT session with focus on dynamic standing balance, functional mobility, and activity tolerance. Pt received sitting in w/c agreeable to therapy despite knee pain. Pt's wife present and both reporting feeling comfortable with ostomy bag and declined further practice at this time. Ambulated to therapy gym with RW at supervision level. Engaged in dynamic standing balance task of tossing ball at rebounder. Pt initiated completed task standing on level floor then progressed to standing on foam. Pt required supervision  for task. Attempted ambulating w/o AD while bouncing ball however pt limited by B knee pain. Engaged in horseshoe toss game in standing with focus on reaching out of BOS to retrieve item and toss at target. Pt reporting slight hip pain, therefore therapist sat backwards on bench to facilitate anterior weight shift. Pt reporting immediately relief, therefore sustained sitting in this position approx 5 min. At end of session pt left sitting on mat table awaiting PT session.   Therapy Documentation Precautions:  Precautions Precautions: Fall Restrictions Weight Bearing Restrictions: No General:   Vital Signs: Therapy Vitals Pulse Rate: (!) 109 BP: 128/74 mmHg Patient Position (if appropriate): Sitting Pain: Pain Assessment Pain Assessment: 0-10 Pain Score: 6  Pain Type: Chronic pain Pain Location: Knee Pain Orientation: Right;Left Pain Descriptors / Indicators: Squeezing;Stabbing Pain Onset: On-going Pain Intervention(s): RN made aware;Repositioned;Ambulation/increased activity Multiple Pain Sites: No  See FIM for current functional status  Therapy/Group: Individual Therapy  [name], [name] 11/26/2014, 12:13 PM

## 2014-11-26 NOTE — Consult Note (Signed)
WOC ostomy follow up Follow up now that in inpatient rehab.  Pt [facility] his [facility], however they have not had any hands on teaching up until this point.  Plans are for DC to home Thursday of this week Stoma type/location: LLQ, end colostomy Stomal assessment/size: 1" x 1 3/4" oval stoma, slightly flush with the skin, pink and moist Peristomal assessment: intact, slight redness noted at mucocutaneous junction and some early folliculitis.  We discussed the need for "dry shave" at least weekly to make sure this did not become a problem for him. They have antifungal powder and ostomy powder in the room that they can take home for this.  Explain to shave with the grain of the hair to lessen likelihood of folliculitis  Treatment options for stomal/peristomal skin: Using 2" barrier ring just around the outside of the stoma for flush stoma and aid in seal of pouch Output thick, pasty brown stool  Ostomy pouching: 2pc. 2 3/4" pouch Education provided:  Wife demonstrated all ostomy care skills with my prompting.  Lock and Roll closure, emptying pouch. Removing old pouch, cleaning peristomal skin.  I did demonstrate measuring the stoma for the wife and marked the measuring guide appropriately.  She cut new wafer and applied new pouch.  We discussed HHRN and supplies needed.  Provided wife with [facility] catelog and marked book with supplies needed. Provided written instructions for use of barrier ring and 2Pc pouching system.  [place] [facility] [facility]   Wife and patient feel confident in the care of his ostomy.  WOC will follow up later in the week pending his DC to home  [facility] HospitalMelody Ayriana Wix RN,CWOCN [phone]

## 2014-11-26 NOTE — Progress Notes (Signed)
Physical Therapy Session Note  Patient Details  Name: William OuJoseph Utley MRN: [id number] Date of Birth: 12/08/1950  Today's Date: 11/26/2014 PT Individual Time: 1410-1440 PT Individual Time Calculation (min): 30 min   Skilled Therapeutic Interventions/Progress Updates:  Pt participated in community gait and mobility 1x300' and 1x150' with RW and close S over varying and uneven surfaces and inclines.  Pt able to navigate busy settings and tight spaces with S. Pt had no LOB, but needed 2 seated rest breaks. Pt self propelled WC >200' with S, including parts management and sit<>stand transfers [facility] and furniture.      Therapy Documentation Precautions:  Precautions Precautions: Fall Restrictions Weight Bearing Restrictions: No    Locomotion : Ambulation Ambulation/Gait Assistance: 5: Supervision Wheelchair Mobility Distance: 200   See FIM for current functional status  Therapy/Group: Individual Therapy  Clydene Lamingole Redina Zeller, PT, DPT  11/26/2014, 3:39 PM

## 2014-11-27 ENCOUNTER — Inpatient Hospital Stay (HOSPITAL_COMMUNITY): Payer: 59 | Admitting: *Deleted

## 2014-11-27 ENCOUNTER — Inpatient Hospital Stay (HOSPITAL_COMMUNITY): Payer: 59

## 2014-11-27 DIAGNOSIS — M170 Bilateral primary osteoarthritis of knee: Secondary | ICD-10-CM

## 2014-11-27 MED ORDER — DICLOFENAC SODIUM 1 % TD GEL
4.0000 g | Freq: Three times a day (TID) | TRANSDERMAL | Status: DC
  Administered 2014-11-27 – 2014-11-28 (×4): 4 g via TOPICAL
  Filled 2014-11-27: qty 100

## 2014-11-27 NOTE — Discharge Instructions (Signed)
Inpatient Rehab Discharge Instructions  Johnney OuJoseph Imbert Discharge date and time: No discharge date for patient encounter.   Activities/Precautions/ Functional Status: Activity: activity as tolerated Diet: regular diet Wound Care: keep wound clean and dry Functional status:  ___ No restrictions     ___ Walk up steps independently ___ 24/7 supervision/assistance   ___ Walk up steps with assistance ___ Intermittent supervision/assistance  ___ Bathe/dress independently ___ Walk with walker     ___ Bathe/dress with assistance ___ Walk Independently    ___ Shower independently _x__ Walk with assistance    ___ Shower with assistance ___ No alcohol     ___ Return to work/school ________    COMMUNITY REFERRALS UPON DISCHARGE:    Outpatient: PT                   Agency: [facility] Phone: [phone]                Appointment Date/Time:  12/02/14 @ 9:30 am (arrive 30 mins prior to appt)   Medical Equipment/Items Ordered: rolling walker                                                     Agency/Supplier: [facility] @ [phone]   GENERAL COMMUNITY RESOURCES FOR PATIENT/FAMILY:  Support Groups:  [employer] (handout provided)       Special Instructions: Routine colostomy care  Follow-up for incidental findings of 8 mm nodule right lower lobe within the next 6 months with PCP   Wound care includes removed cover dressing may moisten to help her move. Wash with soap and water. Replace with lightly normal saline moistened Kerlix 4.5 roll gauze cover with dry dressing and paper tape   My questions have been answered and I understand these instructions. I will adhere to these goals and the provided educational materials after my discharge from the hospital.  Patient/Caregiver Signature _______________________________ Date __________  Clinician Signature _______________________________________ Date __________  Please bring this form  and your medication list with you to all your follow-up doctor's appointments.

## 2014-11-27 NOTE — Progress Notes (Signed)
Patient advised that he is able to place himself on CPAP. RT advised the patient to give a call if he needed anything. RT will continue to monitor.

## 2014-11-27 NOTE — Progress Notes (Signed)
[facility] PHYSICAL MEDICINE & REHABILITATION     PROGRESS NOTE    Subjective/Complaints: Feeling well. Very appreciative of staff. Making good progress. Both knees are sore.  Per nursing CBGs normal, I reviewed these, no history of diabetes, will DC CBGs as well as sliding scale insulin Objective: Vital Signs: Blood pressure 116/75, pulse 85, temperature 98.4 F (36.9 C), temperature source Oral, resp. rate 17, weight 119.8 kg (264 lb 1.8 oz), SpO2 96 %. No results found. No results for input(s): WBC, HGB, HCT, PLT in the last 72 hours. No results for input(s): NA, K, CL, GLUCOSE, BUN, CREATININE, CALCIUM in the last 72 hours.  Invalid input(s): CO CBG (last 3)  No results for input(s): GLUCAP in the last 72 hours.  Wt Readings from Last 3 Encounters:  11/27/14 119.8 kg (264 lb 1.8 oz)    Physical Exam:  Constitutional: He is oriented to person, place, and time.   no acute distress , sitting EOB HENT:  Head: Normocephalic.  Eyes: EOM are normal.  Neck:  Trach site closing. Cardiovascular: Normal rate and regular rhythm.  Respiratory:  Decreased breath sounds at the bases but clear to auscultation  GI: Soft. Bowel sounds are normal.  Abdomen is non distended.  Musculoskeletal: He exhibits edema (bilateral LE). Mild knee pain with ROM Neurological: He is alert and oriented to person, place, and time.  Good insight and awareness. UES: 4/5 deltoid,bicep, tricep, wrist and hands 4+. LES: 3+/5 HF, 4/5 knees, 4/5 ankles. good sitting balance  Psychiatric: He has a normal mood and affect. His behavior is normal. Judgment and thought content normal    Assessment/Plan: 1. Functional deficits secondary to critical illness myopathy which require 3+ hours per day of interdisciplinary therapy in a comprehensive inpatient rehab setting. Physiatrist is providing close team supervision and 24 hour management of active medical problems listed below. Physiatrist and rehab team  continue to assess barriers to discharge/monitor patient progress toward functional and medical goals. Allow therapeutic grounds passes FIM: FIM - Bathing Bathing Steps Patient Completed: Chest, Right Arm, Front perineal area, Buttocks, Left Arm, Abdomen, Left upper leg, Right upper leg, Left lower leg (including foot), Right lower leg (including foot) Bathing: 6: More than reasonable amount of time  FIM - Upper Body Dressing/Undressing Upper body dressing/undressing steps patient completed: Thread/unthread right sleeve of pullover shirt/dresss, Thread/unthread left sleeve of pullover shirt/dress, Put head through opening of pull over shirt/dress, Pull shirt over trunk Upper body dressing/undressing: 6: More than reasonable amount of time FIM - Lower Body Dressing/Undressing Lower body dressing/undressing steps patient completed: Thread/unthread right underwear leg, Pull underwear up/down, Thread/unthread left underwear leg, Thread/unthread right pants leg, Thread/unthread left pants leg, Pull pants up/down, Don/Doff right sock, Don/Doff left sock, Don/Doff right shoe, Don/Doff left shoe, Fasten/unfasten right shoe, Fasten/unfasten left shoe Lower body dressing/undressing: 6: More than reasonable amount of time  FIM - Toileting Toileting steps completed by patient: Adjust clothing prior to toileting, Performs perineal hygiene, Adjust clothing after toileting Toileting: 6: More than reasonable amount of time  FIM - Diplomatic Services operational officerToilet Transfers Toilet Transfers Assistive Devices: Art gallery managerWalker Toilet Transfers: 6-Assistive device: No helper, 6-More than reasonable amt of time  FIM - BankerBed/Chair Transfer Bed/Chair Transfer Assistive Devices: Therapist, occupationalWalker Bed/Chair Transfer: 6: More than reasonable amt of time, 6: Assistive device: no helper  FIM - Locomotion: Wheelchair Distance: 200 Locomotion: Wheelchair: 5: Travels 150 ft or more: maneuvers on rugs and over door sills with supervision, cueing or coaxing FIM -  Locomotion: Ambulation Locomotion: Ambulation  Assistive Devices: Designer, industrial/product Ambulation/Gait Assistance: 5: Supervision Locomotion: Ambulation: 5: Travels 150 ft or more with supervision/safety issues  Comprehension Comprehension Mode: Auditory Comprehension: 7-Follows complex conversation/direction: With no assist  Expression Expression Mode: Verbal Expression: 7-Expresses complex ideas: With no assist  Social Interaction Social Interaction: 7-Interacts appropriately with others - No medications needed.  Problem Solving Problem Solving: 6-Solves complex problems: With extra time  Memory Memory: 6-More than reasonable amt of time Medical Problem List and Plan: 1. 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 Status post partial colectomy, colostomy and exploratory laparotomy 10/28/2014 2. DVT Prophylaxis/Anticoagulation: Subcutaneous Lovenox.  3. Pain Management: Tylenol as needed---add voltaren gel for knee pain 4. Postoperative ARDS/ventilatory support/HCAP. 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 Neuropsych: This patient is capable of making decisions on his own behalf. 6. Skin/Wound Care: Skin care as directed 7. Fluids/Electrolytes/Nutrition/decreased nutritional storage: encourage po  -labs are normal--- 8. Mood/postoperative confusion/restlessness. Cranial CT scan negative. Mental status much improved. Check sleep patterns 9. Hypothyroidism. Synthroid 10. Hypertension. Lopressor 25 mg twice a day 11. Incidental findings of 8 mm right lower lobe nodule on CT abdomen and pelvis. Follow-up as outpatient    LOS (Days) 6 A FACE TO FACE EVALUATION WAS PERFORMED  Destinee Taber T 11/27/2014 8:19 AM

## 2014-11-27 NOTE — Discharge Summary (Signed)
Discharge summary job [id number]

## 2014-11-27 NOTE — Progress Notes (Signed)
Occupational Therapy Session Note  Patient Details  Name: William [name] MRN: [id number] Date of Birth: 02/19/1951  Today's Date: 11/27/2014 OT Individual Time: 0700-0800 and 1030-1100 OT Individual Time Calculation (min): 60 min and 30 min     Short Term Goals: Week 1:  OT Short Term Goal 1 (Week 1): Pt will complete toilet transfer at supervision level OT Short Term Goal 2 (Week 1): Pt will complete LB dressing at supervision level OT Short Term Goal 3 (Week 1): Pt will complete bathing at supervision level OT Short Term Goal 4 (Week 1): Pt will stand for 3 min at supervision level during functional activity   Skilled Therapeutic Interventions/Progress Updates:    Session 1: Pt seen for ADL retraining with focus on safety awareness, functional mobility, standing balance, and activity tolerance. Pt received sitting in w/c with wife present. Provided clear ice bags to cover dressing and stoma site. Pt retreive bathing and dressing needs at Mod I level using RW for functional mobility. Pt completed bathing at shower level and dressing sitting EOB at Mod I with pt initiating rest breaks appropriately. Pt's wife continues to report no concern with ostomy care at this time and declined further practice with OT. Pt made Mod I in controlled environment/room. Pt left with all needs in reach.  Session 2: Pt seen for 1:1 OT session with focus on functional mobility, activity tolerance, bed mobility, and BLE stretching. Pt received sitting in w/c reporting pain in B knees however agreeable to therapy. Pt ambulated to ADL apartment at Mod I level using RW. Practiced bed mobility and furniture transfers at Mod I. Pt reporting pain in knee begins at hip. Therapist provided education and demonstration on IT band and techniques for myofacial release of IT band. Pt reporting immediate decrease in pain and during ambulation back to room. Pt simulated demonstration of providing myofacial release to IT band using a  roller. Pt left with all needs in reach.    Therapy Documentation Precautions:  Precautions Precautions: Fall Restrictions Weight Bearing Restrictions: No General:   Vital Signs:   Pain: Pt reports pain in B knees.   See FIM for current functional status  Therapy/Group: Individual Therapy  [name], Valerian Jewel N 11/27/2014, 12:20 PM

## 2014-11-27 NOTE — Progress Notes (Signed)
Occupational Therapy Discharge Summary  Patient Details  Name: Minas Bonser MRN: [id number] Date of Birth: 02/02/1951  Today's Date: 11/27/2014   Patient has met 10 of 10 long term goals due to improved activity tolerance, improved balance, postural control, ability to compensate for deficits and improved coordination.  Patient to discharge at overall Modified Independent level.  Patient's care partner not necessary to provide the necessary physical assistance at discharge as patient is discharging at Modified Independent level. Patient's wife has been present during all ADL sessions. Patient and wife verbalize and demonstrate ability to manage ostomy bag and report no questions at this time.    Reasons goals not met: N/A. All LTGs met.   Recommendation:  No skilled occupational therapy recommended at this time.  Equipment: No equipment provided Patient's bathroom remodeled to be handicap accessible.  Reasons for discharge: treatment goals met and discharge from hospital  Patient/family agrees with progress made and goals achieved: Yes  OT Discharge Precautions/Restrictions  Precautions Precautions: Fall Restrictions Weight Bearing Restrictions: No General   Vital Signs Therapy Vitals Temp: 98.4 F (36.9 C) Temp Source: Oral Pulse Rate: 85 Resp: 17 BP: 116/75 mmHg Patient Position (if appropriate): Lying Oxygen Therapy SpO2: 96 % O2 Device: CPAP Pain Pain Assessment Pain Assessment: 0-10 Pain Score: 3  Pain Location: Knee Pain Orientation: Right;Left ADL   Vision/Perception  Vision- History Baseline Vision/History: No visual deficits Patient Visual Report: No change from baseline Vision- Assessment Vision Assessment?: Yes Eye Alignment: Within Functional Limits Ocular Range of Motion: Within Functional Limits Alignment/Gaze Preference: Within Defined Limits Tracking/Visual Pursuits: Able to track stimulus in all quads without difficulty Saccades: Within  functional limits Convergence: Within functional limits Visual Fields: No apparent deficits  Cognition Overall Cognitive Status: Within Functional Limits for tasks assessed Arousal/Alertness: Awake/alert Orientation Level: Oriented X4 Attention: Selective Selective Attention: Appears intact Memory: Appears intact Awareness: Appears intact Problem Solving: Appears intact Safety/Judgment: Appears intact Sensation Sensation Light Touch: Appears Intact Hot/Cold: Appears Intact Proprioception: Appears Intact Coordination Gross Motor Movements are Fluid and Coordinated: Yes Fine Motor Movements are Fluid and Coordinated: Yes Motor  Motor Motor: Within Functional Limits Mobility  Bed Mobility Bed Mobility: Supine to Sit;Sit to Supine Supine to Sit: HOB flat;6: Modified independent (Device/Increase time) Sit to Supine: 6: Modified independent (Device/Increase time);HOB flat Transfers Transfers: Sit to Stand;Stand to Sit Sit to Stand: 6: Modified independent (Device/Increase time);With armrests;Without upper extremity assist;With upper extremity assist;From chair/3-in-1;From bed Stand to Sit: 6: Modified independent (Device/Increase time);To bed;To chair/3-in-1;With armrests;Without upper extremity assist;With upper extremity assist  Trunk/Postural Assessment  Cervical Assessment Cervical Assessment: Exceptions to [facility] (forward head posture) Thoracic Assessment Thoracic Assessment: Within Functional Limits Lumbar Assessment Lumbar Assessment: Within Functional Limits Postural Control Postural Limitations: posterior pelvic tilt in sitting  Balance Balance Balance Assessed: Yes Standardized Balance Assessment Standardized Balance Assessment: Berg Balance Test;Timed Up and Go Test Berg Balance Test Sit to Stand: Able to stand without using hands and stabilize independently Standing Unsupported: Able to stand safely 2 minutes Sitting with Back Unsupported but Feet Supported on  Floor or Stool: Able to sit safely and securely 2 minutes Stand to Sit: Sits safely with minimal use of hands Transfers: Able to transfer safely, minor use of hands Standing Unsupported with Eyes Closed: Able to stand 10 seconds safely Standing Ubsupported with Feet Together: Able to place feet together independently and stand 1 minute safely From Standing Position, Pick up Object from Floor: Able to pick up shoe safely and easily From Standing Position,  Turn to Look Behind Over each Shoulder: Looks behind from both sides and weight shifts well Dynamic Sitting Balance Dynamic Sitting - Balance Support: Feet supported;During functional activity Dynamic Sitting - Level of Assistance: 6: Modified independent (Device/Increase time) Static Standing Balance Static Standing - Balance Support: No upper extremity supported;During functional activity;Bilateral upper extremity supported Static Standing - Level of Assistance: 6: Modified independent (Device/Increase time) Dynamic Standing Balance Dynamic Standing - Balance Support: During functional activity;No upper extremity supported;Bilateral upper extremity supported Dynamic Standing - Level of Assistance: 6: Modified independent (Device/Increase time) Extremity/Trunk Assessment RUE Assessment RUE Assessment: Within Functional Limits (4+/5) LUE Assessment LUE Assessment: Within Functional Limits (4+/5)  See FIM for current functional status  Angline Schweigert N 11/27/2014, 7:22 AM

## 2014-11-27 NOTE — Patient Care Conference (Signed)
Inpatient RehabilitationTeam Conference and Plan of Care Update Date: 11/26/2014   Time: 3:20 PM    Patient Name: William [name]      Medical Record Number: [id number]  Date of Birth: 02/14/1951 Sex: Male         Room/Bed: 4W14C/4W14C-01 Payor Info: Payor: [employer] copywriter[employer]ALTH[employer] / Plan: [employer] OTHER / Product Type: *No Product type* /    Admitting Diagnosis: Critical illness myopathy  Admit Date/Time:  11/21/2014 12:50 PM Admission Comments: No comment available   Primary Diagnosis:  Critical illness myopathy Principal Problem: Critical illness myopathy  Patient Active Problem List   Diagnosis Date Noted  . 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 S/P colostomy 11/21/2014  . Hypothyroidism 11/20/2014  . Essential hypertension 11/20/2014  . Protein-calorie malnutrition 11/20/2014  . Abscess of abdominal cavity   . Acute on chronic respiratory failure 11/19/2014  . Tracheostomy status 11/19/2014  . Acute respiratory failure   . Tracheostomy, acute management 11/15/2014  . Fever 11/12/2014  . Rash 11/12/2014  . Abscess of suppurative peritonitis 11/12/2014  . Bacteroides fragilis infection 11/12/2014  . Normocytic anemia 11/12/2014  . Obesity 11/12/2014  . Shock circulatory 10/28/2014  . Acute respiratory failure with hypoxia 10/28/2014  . Acute post-operative pain 10/28/2014  . Solitary pulmonary nodule on lung CT 10/28/2014  . Sleep apnea 10/28/2014  . Diverticulitis of colon with perforation s/p colectomy/colostomy 10/28/2014 10/27/2014    Expected Discharge Date: Expected Discharge Date: 11/28/14  Team Members Present: Physician leading conference: Dr. Faith RogueZachary Swartz Social Worker Present: Amada JupiterLucy Tyrease Vandeberg, LCSW Nurse Present: Carlean PurlMaryann Barbour, RN PT Present: Cyndia SkeetersBridgett Ripa, Scot JunPT;Caroline King, PT OT Present: Ardis Rowanom Lanier, COTA;Jennifer Katrinka BlazingSmith, OT SLP Present: Fae PippinMelissa Bowie, SLP PPS Coordinator present : Tora DuckMarie Noel, RN, CRRN     Current Status/Progress Goal Weekly Team Focus   Medical   CIM. moving well. trach out. getting stronger  iimprove respiratory status, cpap adjustment  trach care, nutrition   Bowel/Bladder   cont of bladder using toilet; colostomy managed bystaff at present  cont of bladder on toilet and colostomy managed by wife  education of wife/pt on colostomy care  and ostomy stoma site care   Swallow/Nutrition/ Hydration             ADL's   supervision overall  Mod I overall  functional transfers, standing balance, safety awareness, activity tolerance, strengthening   Mobility   supervision overall  mod I- supervision   safety, education, functional mobility, balance, activity tolerance, strengthening, activity tolerance   Communication             Safety/Cognition/ Behavioral Observations            Pain   n/a         Skin   nystatin powder to periarea redness  skin integrity improving /redness healing  monitor effectiveness of skin treatments and keep skin dry    Rehab Goals Patient on target to meet rehab goals: Yes *See Care Plan and progress notes for long and short-term goals.  Barriers to Discharge: none    Possible Resolutions to Barriers:  none    Discharge Planning/Teaching Needs:  home with wife who can provide 24/7 assistance  ostomy care education completed   Team Discussion:  Making excellent gains and reaching mod i goals.  Wife completing ostomy training.  Pt very motivated and choosing to do follow up tx as an OP.  No concerns  Revisions to Treatment Plan:  None   Continued Need for Acute Rehabilitation Level of  Care: The patient requires daily medical management by a physician with specialized training in physical medicine and rehabilitation for the following conditions: Daily direction of a multidisciplinary physical rehabilitation program to ensure safe treatment while eliciting the highest outcome that is of practical value to the patient.: Yes Daily medical management of patient stability for increased  activity during participation in an intensive rehabilitation regime.: Yes Daily analysis of laboratory values and/or radiology reports with any subsequent need for medication adjustment of medical intervention for : Post surgical problems;Neurological problems  Kendra Grissett 11/27/2014, 10:42 AM

## 2014-11-27 NOTE — Progress Notes (Signed)
Physical Therapy Discharge Summary  Patient Details  Name: Lavern Maslow MRN: [id number] Date of Birth: [birth year]  Today's Date: 11/27/2014 PT Individual Time: 3875-6433 and 2951-8841 PT Individual Time Calculation (min): 75 min and 23 min   Patient has met 10 of 10 long term goals due to improved activity tolerance, improved balance, improved postural control, increased strength, ability to compensate for deficits, improved awareness and improved coordination.  Patient to discharge at an ambulatory level Modified Independent, supervision for stair negotiation and car transfers for set up/management of RW only. Patient's care partner is not necessary to provide assistance at discharge secondary to patient discharging at mod I level.  Reasons goals not met: N/A, all LTGs met.  Recommendation:  Patient will benefit from ongoing skilled PT services in outpatient setting to continue to advance safe functional mobility, address ongoing impairments in balance, gait, overall functional mobility, activity tolerance, and minimize fall risk.  Equipment: RW  Reasons for discharge: treatment goals met and discharge from hospital  Patient/family agrees with progress made and goals achieved: Yes   Session today focused on all functional mobility and goal-related activities in preparation for d/c home tomorrow, 11/27/14. Please see details below for BERG, TUG, and FTSS.  Five times Sit to Stand Test (FTSS) Method: Use a straight back chair with a solid seat that is 16-18" high. Ask participant to sit on the chair with arms folded across their chest.   Instructions: "Stand up and sit down as quickly as possible 5 times, keeping your arms folded across your chest."   Measurement: Stop timing when the participant stands the 5th time.  TIME: __11.17____ (in seconds); FTSS score on eval: 15.77"  Times > 13.6 seconds is associated with increased disability and morbidity (Guralnik, 2000) Times > 15  seconds is predictive of recurrent falls in healthy individuals aged 62 and older (Buatois, et al., 2008) Normal performance values in community dwelling individuals aged 62 and older (Bohannon, 2006): o 60-69 years: 11.4 seconds o 70-79 years: 12.6 seconds o 80-89 years: 14.8 seconds  MCID: ? 2.3 seconds for Vestibular Disorders Mariah Milling, 2006)   PT Discharge Precautions/Restrictions Precautions Precautions: Fall Restrictions Weight Bearing Restrictions: No Pain Pain Assessment Pain Assessment: No/denies pain Pain Score: 0-No pain Vision/Perception    See OT note Cognition Overall Cognitive Status: Within Functional Limits for tasks assessed Arousal/Alertness: Awake/alert Orientation Level: Oriented X4 Attention: Selective Selective Attention: Appears intact Memory: Appears intact Awareness: Appears intact Problem Solving: Appears intact Safety/Judgment: Appears intact Sensation Sensation Light Touch: Appears Intact Proprioception: Appears Intact Additional Comments: Proprioception intact B ankles Coordination Gross Motor Movements are Fluid and Coordinated: Yes Fine Motor Movements are Fluid and Coordinated: Yes Motor  Motor Motor: Within Functional Limits  Mobility Bed Mobility Bed Mobility: Supine to Sit;Sit to Supine Supine to Sit: HOB flat;6: Modified independent (Device/Increase time) Sit to Supine: 6: Modified independent (Device/Increase time);HOB flat Transfers Transfers: Yes Sit to Stand: 6: Modified independent (Device/Increase time);With armrests;Without upper extremity assist;With upper extremity assist;From chair/3-in-1;From bed Stand to Sit: 6: Modified independent (Device/Increase time);To bed;To chair/3-in-1;With armrests;Without upper extremity assist;With upper extremity assist Stand Pivot Transfers: 6: Modified independent (Device/Increase time);With armrests (with RW) Locomotion  Ambulation Ambulation: Yes Ambulation/Gait Assistance: 6:  Modified independent (Device/Increase time) Ambulation Distance (Feet): 300 Feet Assistive device: Rolling walker Ambulation/Gait Assistance Details: >300' several bouts in controlled, home, and community environments (hospital, ADL apartment, and community outing to [employer]) with RW and mod I. Gait Gait: Yes Gait Pattern: Within Functional Limits Gait Pattern:  Step-through pattern;Antalgic Stairs / Additional Locomotion Stairs: Yes Stairs Assistance: 5: Supervision Stairs Assistance Details: Verbal cues for technique Stair Management Technique: One rail Left;Step to pattern;Sideways Number of Stairs: 12 Height of Stairs: 6 Wheelchair Mobility Wheelchair Mobility: No (patient ambulatory)  Trunk/Postural Assessment  Cervical Assessment Cervical Assessment: Exceptions to [facility] (forward head posture) Thoracic Assessment Thoracic Assessment: Within Functional Limits Lumbar Assessment Lumbar Assessment: Within Functional Limits Postural Control Postural Control: Deficits on evaluation Righting Reactions: delayed Protective Responses: delayed Postural Limitations: posterior pelvic tilt in sitting  Balance Balance Balance Assessed: Yes Standardized Balance Assessment Standardized Balance Assessment: Berg Balance Test;Timed Up and Go Test Berg Balance Test Sit to Stand: Able to stand without using hands and stabilize independently Standing Unsupported: Able to stand safely 2 minutes Sitting with Back Unsupported but Feet Supported on Floor or Stool: Able to sit safely and securely 2 minutes Stand to Sit: Sits safely with minimal use of hands Transfers: Able to transfer safely, minor use of hands Standing Unsupported with Eyes Closed: Able to stand 10 seconds safely Standing Ubsupported with Feet Together: Able to place feet together independently and stand 1 minute safely From Standing, Reach Forward with Outstretched Arm: Can reach confidently >25 cm (10") From Standing  Position, Pick up Object from Floor: Able to pick up shoe safely and easily From Standing Position, Turn to Look Behind Over each Shoulder: Looks behind from both sides and weight shifts well Turn 360 Degrees: Able to turn 360 degrees safely in 4 seconds or less Standing Unsupported, Alternately Place Feet on Step/Stool: Able to stand independently and safely and complete 8 steps in 20 seconds Standing Unsupported, One Foot in Front: Able to plae foot ahead of the other independently and hold 30 seconds Standing on One Leg: Tries to lift leg/unable to hold 3 seconds but remains standing independently Total Score: 52/56, indicating patient is at low risk for falls (~25%); Berg Balance Score on Eval: 41/56 Timed Up and Go Test TUG: Normal TUG Normal TUG (seconds): 13.63; patient who take longer than 13.5" are at increased risk for falls; TUG score on Eval: 20.41" Dynamic Sitting Balance Dynamic Sitting - Balance Support: Feet supported;During functional activity Dynamic Sitting - Level of Assistance: 6: Modified independent (Device/Increase time) Static Standing Balance Static Standing - Balance Support: No upper extremity supported;During functional activity;Bilateral upper extremity supported Static Standing - Level of Assistance: 6: Modified independent (Device/Increase time) Dynamic Standing Balance Dynamic Standing - Balance Support: During functional activity;No upper extremity supported;Bilateral upper extremity supported Dynamic Standing - Level of Assistance: 6: Modified independent (Device/Increase time) Extremity Assessment  RLE Assessment RLE Assessment: Within Functional Limits LLE Assessment LLE Assessment: Within Functional Limits  See FIM for current functional status  Rayssa Atha S Tashi Band S. Camarie Mctigue, PT, DPT 11/27/2014, 4:02 PM

## 2014-11-27 NOTE — Plan of Care (Signed)
Problem: RH Car Transfers Goal: LTG Patient will perform car transfers with assist (PT) LTG: Patient will perform car transfers with assistance (PT).  Outcome: Completed/Met Date Met:  11/27/14 Requires set up assist for management of RW

## 2014-11-27 NOTE — Progress Notes (Signed)
Social Work Patient ID: William [name], male   DOB: 1951-07-03, 64 y.o.   MRN: [id number]   Met with pt and wife this morning to review team conference.  Both already aware that d/c date for tomorrow had been set and VERY happy with this.  Pt and wife report they feel they have received excellent education on the ostomy care for home and no concerns about managing this.  They confirm that they prefer to do their follow up PT as an outpatient (understand that they will not be eligible for [facility] if going to OP and are agreeable).  OPPT set up at [facility] as much closer to their home.  Very complimentary of all the staff on CIR.  Rylin Seavey, LCSW

## 2014-11-27 NOTE — Discharge Summary (Signed)
NAMEMarland Kitchen  [name], William NO.:  [id number]  MEDICAL RECORD NO.:  [id number]  LOCATION:  [id number]                        FACILITY:  [facility]  PHYSICIAN:  William [name], M.D.DATE OF BIRTH:  [birth year]  DATE OF ADMISSION:  11/21/2014 DATE OF DISCHARGE:  11/28/2014                              DISCHARGE SUMMARY   DISCHARGE DIAGNOSES: 1. Functional deficits secondary to critical illness myopathy after     sigmoid diverticulitis with perforation.  Status post partial     colectomy, colostomy, and exploratory laparotomy. 2. Subcutaneous Lovenox for deep venous thrombosis prophylaxis. 3. Postoperative acute respiratory distress syndrome with ventilatory     support status post tracheostomy decannulated. 4. Hypothyroidism. 5. Hypertension. 6. Incidental findings of an 8 mm right lower lobe nodule on CT scan.     Plan follow up as outpatient.  HISTORY OF PRESENT ILLNESS:  This is a 64 year old right-handed male, former smoker, obstructive sleep apnea, admitted on October 27, 2014, with diffuse abdominal pain worse in the left lower quadrant and low- grade fever.  Independent prior to admission, living with his wife.  CT chest, abdomen and imaging revealed significant sigmoid diverticulitis with contained perforation and incidental findings of 8 mm right lower lobe nodule.  Underwent partial colectomy, colostomy, exploratory laparotomy, repair of umbilical hernia on October 28, 2014, per Dr. Carolynne [name].  Maintained on broad-spectrum antibiotics.  Postoperative hypotension, ARDS, healthcare-associated pneumonia.  Critical Care Medicine followup.  Aggressive ventilatory support.  Ultimately required tracheostomy on November 08, 2014, per Dr. Jenne [name].  The patient completed course of antibiotic therapy.  Wound care follow up for colostomy care and education.  T and A initiated for nutritional support, later discontinued as diet was advanced.  Bouts of confusion, agitation early in  hospital course.  Cranial CT scan negative for acute changes. Subcutaneous Lovenox for DVT prophylaxis.  His tracheostomy tube had been downsized to a #6 cuffless Shiley.  Therapies initiated and ongoing.  The patient was admitted for comprehensive rehab program.  PAST MEDICAL HISTORY:  See discharge diagnoses.  SOCIAL HISTORY:  Lives with wife.  Functional history prior to admission independent.  Functional status upon admission to Rehab Services was minimal assist, ambulate 75 feet rolling walker, moderate assist transfers, min to mod assist activities of daily living.  PHYSICAL EXAMINATION:  VITAL SIGNS:  Blood pressure 107/73, pulse 90, temperature 98, respirations 18. GENERAL:  This was an alert male, in no acute distress. NEUROLOGIC:  Good insight and awareness of his deficits.  He was oriented x3. LUNGS:  Clear to auscultation. CARDIAC:  Regular rate and rhythm. ABDOMEN:  Mildly distended.  Surgical site with a heavy bulky dressing in place.  No odor as well as colostomy bag, tracheostomy tube in place with red trach cap.  REHABILITATION HOSPITAL COURSE:  Patient was admitted to Inpatient Rehab Services.  The following issues were addressed during the patient's rehabilitation course.  Pertaining to [name] critical illness myopathy after sigmoid diverticulitis perforation, he had undergone colectomy, colostomy, and exploratory laparotomy on October 28, 2014. The patient would follow up with Dr. Carolynne [name].  Surgical site healing nicely.  Full colostomy care and education ongoing.  Subcutaneous Lovenox for DVT prophylaxis.  No bleeding  episodes.  His tracheostomy tube had since been downsized, decannulated, oxygen saturations remained good.  He did have a history of obstructive sleep apnea.  He would be ongoing with his CPAP at home.  Blood pressures well controlled on Lopressor.  During hospital workup, incidental finding of an 8 mm right lower lobe nodule on CT abdomen and  pelvis.  It was recommended as followup in the next 6 months as an outpatient.  His diet had been advanced to regular, tolerating well.  He exhibited no signs of fluid overload on low-dose Lasix.  The patient received weekly collaborative interdisciplinary team conferences to discuss estimated length of stay, family teaching, and any barriers to discharge.  The patient participated in community ambulation, mobility, supervision overall with a rolling walker, greater than 300 feet.  Strength and endurance continued to improve.  He could propel his wheelchair with supervision and maintain all wheelchair parts.  Activities of daily living focused on functional mobility, activity tolerance, safety awareness.  He could ambulate to the bathroom supervision level with rolling walker, completed bathing, sit to stand, level with good safety awareness.  Full family teaching was completed.  Plan was discharge to home [facility]  DISCHARGE MEDICATIONS:  Included: 1. Lasix 20 mg p.o. daily. 2. Synthroid 150 mcg p.o. daily. 3. Lopressor 25 mg p.o. b.i.d. 4. Protonix 40 mg p.o. daily. 5. Florastor 250 mg p.o. b.i.d.  Routine colostomy care as advised.  He will be receiving wound care, remove cover dressing and packing, may moisten to help remove, wash wound gently with soap and water, repack wound with lightly normal saline, moistened Kerlix, 4-1/2 inch wide roll gauze, cover with dry dressing and paper tape  as needed.  DIET:  Regular.  FOLLOWUP:  The patient would follow up with Dr. Faith RogueZachary [name] at the Outpatient Rehab Service office as needed; Dr. Carolynne Edouardoth 2 weeks; Dr. Christia Readingwight [name] as needed.  SPECIAL INSTRUCTIONS:  It was recommended that patient followup within the next 6 months in regard to incidental findings of 8 mm right lower lobe nodule for followup.     William [name], P.A.   ______________________________ William OysterZachary T. [name],  M.D.    DA/MEDQ  D:  11/27/2014  T:  11/27/2014  Job:  [id number]  cc:   Dr. Kizzie FantasiaHubert Fiery Dwight D Bates, MD Ollen GrossPaul S. Vernell Morgansoth III, M.D.

## 2014-11-28 LAB — CREATININE, SERUM
CREATININE: 1.1 mg/dL (ref 0.50–1.35)
GFR, EST AFRICAN AMERICAN: 82 mL/min — AB (ref >90)
GFR, EST NON AFRICAN AMERICAN: 71 mL/min — AB (ref >90)

## 2014-11-28 MED ORDER — METOPROLOL TARTRATE 25 MG PO TABS: 25.0000 mg | ORAL_TABLET | Freq: Two times a day (BID) | ORAL | Status: AC

## 2014-11-28 MED ORDER — FUROSEMIDE 20 MG PO TABS: 20.0000 mg | ORAL_TABLET | Freq: Every day | ORAL | Status: DC

## 2014-11-28 MED ORDER — LEVOTHYROXINE SODIUM 150 MCG PO TABS: 150.0000 ug | ORAL_TABLET | Freq: Every day | ORAL | Status: DC

## 2014-11-28 MED ORDER — DICLOFENAC SODIUM 1 % TD GEL: 4.0000 g | Freq: Three times a day (TID) | TRANSDERMAL | Status: DC

## 2014-11-28 MED ORDER — PANTOPRAZOLE SODIUM 40 MG PO TBEC: 40.0000 mg | DELAYED_RELEASE_TABLET | Freq: Every day | ORAL | Status: DC

## 2014-11-28 MED ORDER — SACCHAROMYCES BOULARDII 250 MG PO CAPS: 250.0000 mg | ORAL_CAPSULE | Freq: Two times a day (BID) | ORAL | Status: DC

## 2014-11-28 NOTE — Progress Notes (Signed)
Pt. Got d/c papers,prescriptions and instructions.Pt. Got ready to go home with his wife.

## 2014-11-28 NOTE — Progress Notes (Signed)
[place] PHYSICAL MEDICINE & REHABILITATION     PROGRESS NOTE    Subjective/Complaints: Excited about discharge. Ready to go! Objective: Vital Signs: Blood pressure 112/78, pulse 96, temperature 98.7 F (37.1 C), temperature source Oral, resp. rate 20, weight 122.9 kg (270 lb 15.1 oz), SpO2 96 %. No results found. No results for input(s): WBC, HGB, HCT, PLT in the last 72 hours.  Recent Labs  11/28/14 0524  CREATININE 1.10   CBG (last 3)  No results for input(s): GLUCAP in the last 72 hours.  Wt Readings from Last 3 Encounters:  11/28/14 122.9 kg (270 lb 15.1 oz)    Physical Exam:  Constitutional: He is oriented to person, place, and time.   no acute distress , sitting EOB HENT:  Head: Normocephalic.  Eyes: EOM are normal.  Neck:  Trach site closed Cardiovascular: Normal rate and regular rhythm.  Respiratory:  Decreased breath sounds at the bases but clear to auscultation  GI: Soft. Bowel sounds are normal.  Abdomen is non distended. Wound clean with granulatin. Ostomy sealed. Musculoskeletal: He exhibits edema (bilateral LE). Mild knee pain with ROM Neurological: He is alert and oriented to person, place, and time.  Good insight and awareness. UES: 4/5 deltoid,bicep, tricep, wrist and hands 4+. LES: 3+/5 HF, 4/5 knees, 4/5 ankles. good sitting balance  Psychiatric: He has a normal mood and affect. His behavior is normal. Judgment and thought content normal    Assessment/Plan: 1. Functional deficits secondary to critical illness myopathy which require 3+ hours per day of interdisciplinary therapy in a comprehensive inpatient rehab setting. Physiatrist is providing close team supervision and 24 hour management of active medical problems listed below. Physiatrist and rehab team continue to assess barriers to discharge/monitor patient progress toward functional and medical goals.  Dc home today. Goals met!!   FIM: FIM - Bathing Bathing Steps Patient  Completed: Chest, Right Arm, Front perineal area, Buttocks, Left Arm, Abdomen, Left upper leg, Right upper leg, Left lower leg (including foot), Right lower leg (including foot) Bathing: 6: More than reasonable amount of time  FIM - Upper Body Dressing/Undressing Upper body dressing/undressing steps patient completed: Thread/unthread right sleeve of pullover shirt/dresss, Thread/unthread left sleeve of pullover shirt/dress, Put head through opening of pull over shirt/dress, Pull shirt over trunk Upper body dressing/undressing: 6: More than reasonable amount of time FIM - Lower Body Dressing/Undressing Lower body dressing/undressing steps patient completed: Thread/unthread right underwear leg, Pull underwear up/down, Thread/unthread left underwear leg, Thread/unthread right pants leg, Thread/unthread left pants leg, Pull pants up/down, Don/Doff right sock, Don/Doff left sock, Don/Doff right shoe, Don/Doff left shoe, Fasten/unfasten right shoe, Fasten/unfasten left shoe Lower body dressing/undressing: 6: More than reasonable amount of time  FIM - Toileting Toileting steps completed by patient: Adjust clothing prior to toileting, Performs perineal hygiene, Adjust clothing after toileting Toileting: 6: More than reasonable amount of time  FIM - Radio producer Devices: Insurance account manager Transfers: 6-Assistive device: No helper, 6-More than reasonable amt of time  FIM - Control and instrumentation engineer Devices: Environmental consultant, Arm rests Bed/Chair Transfer: 6: Assistive device: no helper, 6: More than reasonable amt of time, 6: Supine > Sit: No assist, 6: Sit > Supine: No assist, 6: Bed > Chair or W/C: No assist, 6: Chair or W/C > Bed: No assist  FIM - Locomotion: Wheelchair Distance: 200 Locomotion: Wheelchair: 0: Activity did not occur (pt ambulatory) FIM - Locomotion: Ambulation Locomotion: Ambulation Assistive Devices: Administrator Ambulation/Gait  Assistance: 6: Modified  independent (Device/Increase time) Locomotion: Ambulation: 6: Travels 150 ft or more with assistive device/no helper  Comprehension Comprehension Mode: Auditory Comprehension: 7-Follows complex conversation/direction: With no assist  Expression Expression Mode: Verbal Expression: 7-Expresses complex ideas: With no assist  Social Interaction Social Interaction: 7-Interacts appropriately with others - No medications needed.  Problem Solving Problem Solving: 6-Solves complex problems: With extra time  Memory Memory: 6-More than reasonable amt of time Medical Problem List and Plan: 1. 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 Status post partial colectomy, colostomy and exploratory laparotomy 10/28/2014 2. DVT Prophylaxis/Anticoagulation: Subcutaneous Lovenox.  3. Pain Management: Tylenol as needed--- voltaren gel for knee pain 4. Postoperative ARDS/ventilatory support/HCAP. 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 Neuropsych: This patient is capable of making decisions on his own behalf. 6. Skin/Wound Care: Skin care as directed 7. Fluids/Electrolytes/Nutrition/decreased nutritional storage: encourage po  -labs are normal--- 8. Mood/postoperative confusion/restlessness. Cranial CT scan negative. Mental status much improved. Check sleep patterns 9. Hypothyroidism. Synthroid 10. Hypertension. Lopressor 25 mg twice a day 11. Incidental findings of 8 mm right lower lobe nodule on CT abdomen and pelvis. Follow-up as outpatient    LOS (Days) 7 A FACE TO FACE EVALUATION WAS PERFORMED  Deosha Werden T 11/28/2014 8:23 AM

## 2014-11-28 NOTE — Progress Notes (Signed)
Pt is not  A stoma as received in report. Pt had trach removed 2/6. Pt is doing well. No need for further checks on pt.

## 2014-11-28 NOTE — Progress Notes (Signed)
Social Work  Discharge Note  The overall goal for the admission was met for:   Discharge location: Yes - home with wife who can provide 24/7 care  Length of Stay: Yes - 6 days  Discharge activity level: Yes - mod i goals  Home/community participation: Yes  Services provided included: MD, RD, PT, OT, SLP, RN, TR, Pharmacy and [place]: Private Insurance: [facility]  Follow-up services arranged: Outpatient: PT via [facility], DME: rolling walker via [place] and Patient/Family has no preference for HH/DME agencies  Comments (or additional information):  Patient/Family verbalized understanding of follow-up arrangements: Yes  Individual responsible for coordination of the follow-up plan: patient  Confirmed correct DME delivered: Gera Inboden 11/28/2014    Taejah Ohalloran

## 2015-01-02 ENCOUNTER — Ambulatory Visit (INDEPENDENT_AMBULATORY_CARE_PROVIDER_SITE_OTHER): Payer: 59 | Admitting: Pulmonary Disease

## 2015-01-02 ENCOUNTER — Encounter: Payer: Self-pay | Admitting: Pulmonary Disease

## 2015-01-02 VITALS — BP 130/88 | HR 103 | Temp 98.1°F | Ht 71.0 in | Wt 270.0 lb

## 2015-01-02 DIAGNOSIS — J9620 Acute and chronic respiratory failure, unspecified whether with hypoxia or hypercapnia: Secondary | ICD-10-CM

## 2015-01-02 DIAGNOSIS — R911 Solitary pulmonary nodule: Secondary | ICD-10-CM

## 2015-01-02 DIAGNOSIS — R0602 Shortness of breath: Secondary | ICD-10-CM

## 2015-01-02 DIAGNOSIS — G4733 Obstructive sleep apnea (adult) (pediatric): Secondary | ICD-10-CM

## 2015-01-02 NOTE — Assessment & Plan Note (Signed)
Call me with name of DME & Rx for CPAP supplies will be sent - Also download will be checked to adjust pressure on CPAP

## 2015-01-02 NOTE — Assessment & Plan Note (Signed)
Has resolved Spirometry shows good lung function Does not really need Ventolin MDI. Okay to proceed with colostomy reversal from pulmonary standpoint

## 2015-01-02 NOTE — Assessment & Plan Note (Signed)
Ct chest - no  Contrast in July 2016

## 2015-01-02 NOTE — Progress Notes (Signed)
   Subjective:    Patient ID: William [name], male    DOB: 09/09/1951, 64 y.o.   MRN: [id number]  HPI  64 y/o male former smoker with OSA for post hospital follow-up  He presented with abd pain 10/2014  from perforated sigmoid colon taken to OR emergently. Remained on vent post op. Failed extubation 1/16, reintubated. 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 Follow-up    Coughing up clear phlegm; some tightness in chest; wheezing     Past Medical History  Diagnosis Date  . Renal disorder     kidney stones  . Hypertension   . Thyroid disease   . Hypothyroidism 11/20/2014  . Essential hypertension 11/20/2014  . Protein-calorie malnutrition 11/20/2014  . COPD (chronic obstructive pulmonary disease)     Review of Systems neg for any significant sore throat, dysphagia, itching, sneezing, nasal congestion or excess/ purulent secretions, fever, chills, sweats, unintended wt loss, pleuritic or exertional cp, hempoptysis, orthopnea pnd or change in chronic leg swelling. Also denies presyncope, palpitations, heartburn, abdominal pain, nausea, vomiting, diarrhea or change in bowel or urinary habits, dysuria,hematuria, rash, arthralgias, visual complaints, headache, numbness weakness or ataxia.     Objective:   Physical Exam  Gen. Pleasant, obese, in no distress ENT - no lesions, no post nasal drip, healed tracheostomy scar Neck: No JVD, no thyromegaly, no carotid bruits Lungs: no use of accessory muscles, no dullness to percussion, decreased without rales or  rhonchi  Cardiovascular: Rhythm regular, heart sounds  normal, no murmurs or gallops, no peripheral edema Musculoskeletal: No deformities, no cyanosis or clubbing , no tremors      Assessment & Plan:

## 2015-01-02 NOTE — Patient Instructions (Addendum)
Breathing test shows good lung function OK to stop ventolin Call me with name of DME & Rx for CPAP supplies will be sent - Also download will be checked to adjust pressure on CPAP Ct chest - no  Contrast in July 2016

## 2015-01-04 ENCOUNTER — Encounter: Payer: Self-pay | Admitting: Pulmonary Disease

## 2015-01-04 DIAGNOSIS — G4733 Obstructive sleep apnea (adult) (pediatric): Secondary | ICD-10-CM

## 2015-01-06 NOTE — Telephone Encounter (Signed)
3.19.16 mychart message from pt: Message     the name of the c-pap supply store is [facility] [place] , [place]    phone # [phone]      From the 3.17.16 ov w/ RA: Patient Instructions     Breathing test shows good lung function OK to stop ventolin Call me with name of DME & Rx for CPAP supplies will be sent - Also download will be checked to adjust pressure on CPAP Ct chest - no Contrast in July 2016   Order sent to DME for the above Email response sent to pt informing him of this Message closed and routed to RA as [employer]

## 2015-01-09 ENCOUNTER — Telehealth: Payer: Self-pay | Admitting: Pulmonary Disease

## 2015-01-09 NOTE — Telephone Encounter (Signed)
Received a fax from [facility] requesting copy of Sleep Study for insurance qualifications to fulfill order for CPAP.  I do not see a sleep study in patient's file.  [facility]ing records.  Patient says he will call them as well to have them fax records to us.    Patient advise me that [facility] will not be his provider, they do not take his insurance.  He will call me back and let me know which DME accepts his insurance.  Awaiting call back from patient.

## 2015-01-10 ENCOUNTER — Encounter: Payer: Self-pay | Admitting: Pulmonary Disease

## 2015-01-10 DIAGNOSIS — G4733 Obstructive sleep apnea (adult) (pediatric): Secondary | ICD-10-CM

## 2015-01-13 ENCOUNTER — Telehealth: Payer: Self-pay | Admitting: Pulmonary Disease

## 2015-01-13 NOTE — Telephone Encounter (Signed)
[facility] with [facility]  He states unable to provide the pt with CPAP supplies due to no sleep study on file  RA, please advise thanks

## 2015-01-13 NOTE — Telephone Encounter (Signed)
See TE from 01/13/15.

## 2015-01-13 NOTE — Telephone Encounter (Signed)
Per 3.25.16 mychart message from pt: Message     [facility] Dme    medical supplier    they are in network provider        thanks     William J.    Per the 3.17.16 ov w/ RA, pt was to contact the office with his DME company - had emailed the office stating that [facility] was his DME Per the 3.24.16 phone note, William [name] spoke with patient and was informed that [facility] is not within network and would call back with a DME that is   [facility] added to the care coordination note and order sent for CPAP supplies and download Obstructive sleep apnea - Oretha Milchakesh Alva V, MD at 01/02/2015 6:03 PM     Status: Written Related Problem: Obstructive sleep apnea   Expand All Collapse All   Call me with name of DME & Rx for CPAP supplies will be sent - Also download will be checked to adjust pressure on CPAP       Email sent to patient informing him of the above Will sign and forward to RA as FYI

## 2015-01-13 NOTE — Telephone Encounter (Signed)
[facility])  Sp[facility] with [facility] and he says that with the information he received from the hospital and the sleep study he should be able to get patient supplies.   FYI to RA

## 2015-01-13 NOTE — Telephone Encounter (Signed)
Pl send to different DME (not [facility]) - should  not need sleep study for supplies

## 2015-01-13 NOTE — Telephone Encounter (Signed)
Called again for sleep study, William [name] from medical records states she will fax "right now", awaiting fax.

## 2015-01-22 ENCOUNTER — Telehealth: Payer: Self-pay | Admitting: Pulmonary Disease

## 2015-01-22 NOTE — Telephone Encounter (Signed)
PSG 02/2010 -AHI 94 corrected by CPAP 15 cm

## 2015-03-19 ENCOUNTER — Encounter: Payer: Self-pay | Admitting: Gastroenterology

## 2015-03-25 ENCOUNTER — Encounter: Payer: Self-pay | Admitting: Pulmonary Disease

## 2015-04-10 ENCOUNTER — Ambulatory Visit (AMBULATORY_SURGERY_CENTER): Payer: Self-pay

## 2015-04-10 VITALS — Ht 71.0 in | Wt 292.6 lb

## 2015-04-10 DIAGNOSIS — Z1211 Encounter for screening for malignant neoplasm of colon: Secondary | ICD-10-CM

## 2015-04-10 MED ORDER — SUPREP BOWEL PREP KIT 17.5-3.13-1.6 GM/177ML PO SOLN: 1.0000 | Freq: Once | ORAL | Status: DC

## 2015-04-10 NOTE — Progress Notes (Signed)
Dr Arlyce Dice,  If pt needs pathology wants sent to [facility] since he is an employee it is more cost effective for pt.

## 2015-04-10 NOTE — Progress Notes (Signed)
No allergies to eggs or soy No diet/weight loss meds No home oxygen No past problems with anesthesia  Has email  Emmi instructions given for colonoscopy 

## 2015-04-30 ENCOUNTER — Telehealth: Payer: Self-pay | Admitting: *Deleted

## 2015-04-30 NOTE — Telephone Encounter (Signed)
Discussed history and current health status with Azucena KubaJ. Nulty, CRNA and Dr. Arlyce DiceKaplan regarding appropriateness of [facility]. No restrictions for care here and Dr. Arlyce DiceKaplan states diagnosis would be history of diverticulitis as indication for procedure.

## 2015-05-01 ENCOUNTER — Encounter: Payer: Self-pay | Admitting: Gastroenterology

## 2015-05-01 ENCOUNTER — Ambulatory Visit (AMBULATORY_SURGERY_CENTER): Payer: 59 | Admitting: Gastroenterology

## 2015-05-01 VITALS — BP 133/85 | HR 73 | Temp 97.3°F | Resp 29 | Ht 71.0 in | Wt 292.0 lb

## 2015-05-01 DIAGNOSIS — Z1211 Encounter for screening for malignant neoplasm of colon: Secondary | ICD-10-CM

## 2015-05-01 DIAGNOSIS — K5730 Diverticulosis of large intestine without perforation or abscess without bleeding: Secondary | ICD-10-CM

## 2015-05-01 DIAGNOSIS — Z8719 Personal history of other diseases of the digestive system: Secondary | ICD-10-CM

## 2015-05-01 MED ORDER — SODIUM CHLORIDE 0.9 % IV SOLN: 500.0000 mL | INTRAVENOUS | Status: DC

## 2015-05-01 NOTE — Progress Notes (Signed)
A/ox3 pleased with MAC, report to Suzanne RN 

## 2015-05-01 NOTE — Patient Instructions (Signed)
Discharge instructions given. Resume previous medications. Handouts on diverticulosis and a high fiber diet. YOU HAD AN ENDOSCOPIC PROCEDURE TODAY AT THE [facility]:   Refer to the procedure report that was given to you for any specific questions about what was found during the examination.  If the procedure report does not answer your questions, please call your gastroenterologist to clarify.  If you requested that your care partner not be given the details of your procedure findings, then the procedure report has been included in a sealed envelope for you to review at your convenience later.  YOU SHOULD EXPECT: Some feelings of bloating in the abdomen. Passage of more gas than usual.  Walking can help get rid of the air that was put into your GI tract during the procedure and reduce the bloating. If you had a lower endoscopy (such as a colonoscopy or flexible sigmoidoscopy) you may notice spotting of blood in your stool or on the toilet paper. If you underwent a bowel prep for your procedure, you may not have a normal bowel movement for a few days.  Please Note:  You might notice some irritation and congestion in your nose or some drainage.  This is from the oxygen used during your procedure.  There is no need for concern and it should clear up in a day or so.  SYMPTOMS TO REPORT IMMEDIATELY:   Following lower endoscopy (colonoscopy or flexible sigmoidoscopy):  Excessive amounts of blood in the stool  Significant tenderness or worsening of abdominal pains  Swelling of the abdomen that is new, acute  Fever of 100F or higher   For urgent or emergent issues, a gastroenterologist can be reached at any hour by calling [phone].   DIET: Your first meal following the procedure should be a small meal and then it is ok to progress to your normal diet. Heavy or fried foods are harder to digest and may make you feel nauseous or bloated.  Likewise, meals heavy in dairy and vegetables  can increase bloating.  Drink plenty of fluids but you should avoid alcoholic beverages for 24 hours.  ACTIVITY:  You should plan to take it easy for the rest of today and you should NOT DRIVE or use heavy machinery until tomorrow (because of the sedation medicines used during the test).    FOLLOW UP: Our staff will call the number listed on your records the next business day following your procedure to check on you and address any questions or concerns that you may have regarding the information given to you following your procedure. If we do not reach you, we will leave a message.  However, if you are feeling well and you are not experiencing any problems, there is no need to return our call.  We will assume that you have returned to your regular daily activities without incident.  If any biopsies were taken you will be contacted by phone or by letter within the next 1-3 weeks.  Please call us at [phone] if you have not heard about the biopsies in 3 weeks.    SIGNATURES/CONFIDENTIALITY: You and/or your care partner have signed paperwork which will be entered into your electronic medical record.  These signatures attest to the fact that that the information above on your After Visit Summary has been reviewed and is understood.  Full responsibility of the confidentiality of this discharge information lies with you and/or your care-partner.

## 2015-05-01 NOTE — Op Note (Addendum)
[facility] [address] [place], [id number]   COLONOSCOPY PROCEDURE REPORT  PATIENT: William [name], William [name]  MR#: [id number] BIRTHDATE: [birth year] , 64  yrs. old GENDER: male ENDOSCOPIST: Louis Meckelobert D Harleyquinn Gasser, MD REFERRED OZ:HYQMBY:Paul Carolynne Edouardoth, M.D. PROCEDURE DATE:  05/01/2015 PROCEDURE:   Colonoscopy, screening and Diagnostic colonoscopy via stoma. perforated diverticulitis 5 months ago First Screening Colonoscopy - Avg.  risk and is 50 yrs.  old or older Yes.  Prior Negative Screening - Now for repeat screening. N/A  History of Adenoma - Now for follow-up colonoscopy & has been > or = to 3 yrs.  N/A  Recommend repeat exam, <10 yrs? No ASA CLASS:   Class II INDICATIONS:Colorectal Neoplasm Risk Assessment for this procedure is average risk. MEDICATIONS: Monitored anesthesia care and Propofol 180 mg IV  DESCRIPTION OF PROCEDURE:   After the risks benefits and alternatives of the procedure were thoroughly explained, informed consent was obtained.  The digital rectal exam revealed no abnormalities of the rectum.   The LB [id number]  endoscope was introduced through the an  Ostomy and advanced to the cecum, which was identified by both the appendix and ileocecal valve. No adverse events experienced.   The quality of the prep was (Suprep was used) excellent.  The instrument was then slowly withdrawn as the colon was fully examined. Estimated blood loss is zero unless otherwise noted in this procedure report. After withdrawing the scope the scope was inserted into the rectum and passed the full length of the mucous fistula to 38 cm.  Scope was then withdrawn and the distal colon was carefully examined.      COLON FINDINGS: There was mild diverticulosis noted in the descending colon.   The examination was otherwise normal. Retroflexed views revealed no abnormalities. The time to cecum = 7:10 Withdrawal time =      The scope was withdrawn and the procedure  completed. COMPLICATIONS: There were no immediate complications.  ENDOSCOPIC IMPRESSION: 1.   Mild diverticulosis was noted in the descending colon 2.   The examination was otherwise normal  RECOMMENDATIONS: Continue current colorectal screening recommendations for "routine risk" patients with a repeat colonoscopy in 10 years.  eSigned:  Louis Meckelobert D Peaches Vanoverbeke, MD 05/01/2015 12:09 PM Revised: 05/01/2015 12:09 PM  cc:

## 2015-05-02 ENCOUNTER — Telehealth: Payer: Self-pay | Admitting: *Deleted

## 2015-05-02 NOTE — Telephone Encounter (Signed)
  Follow up Call-  Call back number 05/01/2015  Post procedure Call Back phone  # [phone]  Permission to leave phone message Yes     Patient questions:  Do you have a fever, pain , or abdominal swelling? No. Pain Score  0 *  Have you tolerated food without any problems? Yes.    Have you been able to return to your normal activities? Yes.    Do you have any questions about your discharge instructions: Diet   No. Medications  No. Follow up visit  No.  Do you have questions or concerns about your Care? No.  Actions: * If pain score is 4 or above: No action needed, pain <4.

## 2015-06-07 NOTE — Pre-Procedure Instructions (Signed)
Qusai Kem  06/07/2015      Your procedure is scheduled on September 1.  Report to [facility] Admitting at 5:30 A.M.  Call this number if you have problems the morning of surgery:  [phone]   Remember:  Do not eat food or drink liquids after midnight.  Take these medicines the morning of surgery with A SIP OF WATER Levothyroxine, Metoprolol, Pantoprazole, Ventolin   STOP Vitamin D, Meloxicam, Multiple Vitamins, Vitamin E August 25   STOP/ Do not take Aspirin, Aleve, Naproxen, Advil, Ibuprofen, Motrin, Vitamins, Herbs, or Supplements starting August 25   Do not wear jewelry, make-up or nail polish.  Do not wear lotions, powders, or perfumes.  You may wear deodorant.  Do not shave 48 hours prior to surgery.  Men may shave face and neck.  Do not bring valuables to the hospital.  [facility] is not responsible for any belongings or valuables.  Contacts, dentures or bridgework may not be worn into surgery.  Leave your suitcase in the car.  After surgery it may be brought to your room.  For patients admitted to the hospital, discharge time will be determined by your treatment team.  Patients discharged the day of surgery will not be allowed to drive home.   [facility] - Preparing for Surgery  Before surgery, you can play an important role.  Because skin is not sterile, your skin needs to be as free of germs as possible.  You can reduce the number of germs on you skin by washing with CHG (chlorahexidine gluconate) soap before surgery.  CHG is an antiseptic cleaner which kills germs and bonds with the skin to continue killing germs even after washing.  Please DO NOT use if you have an allergy to CHG or antibacterial soaps.  If your skin becomes reddened/irritated stop using the CHG and inform your nurse when you arrive at Short Stay.  Do not shave (including legs and underarms) for at least 48 hours prior to the first CHG shower.  You may shave your  face.  Please follow these instructions carefully:   1.  Shower with CHG Soap the night before surgery and the morning of Surgery.  2.  If you choose to wash your hair, wash your hair first as usual with your normal shampoo.  3.  After you shampoo, rinse your hair and body thoroughly to remove the shampoo.  4.  Use CHG as you would any other liquid soap.  You can apply CHG directly to the skin and wash gently with scrungie or a clean washcloth.  5.  Apply the CHG Soap to your body ONLY FROM THE NECK DOWN.  Do not use on open wounds or open sores.  Avoid contact with your eyes, ears, mouth and genitals (private parts).  Wash genitals (private parts) with your normal soap.  6.  Wash thoroughly, paying special attention to the area where your surgery will be performed.  7.  Thoroughly rinse your body with warm water from the neck down.  8.  DO NOT shower/wash with your normal soap after using and rinsing off the CHG Soap.  9.  Pat yourself dry with a clean towel.            10.  Wear clean pajamas.            11.  Place clean sheets on your bed the night of your first shower and do not sleep with pets.  Day of  Surgery  Do not apply any lotions the morning of surgery.  Please wear clean clothes to the hospital/surgery center.    Please read over the following fact sheets that you were given. Pain Booklet, Coughing and Deep Breathing and Surgical Site Infection Prevention

## 2015-06-09 ENCOUNTER — Other Ambulatory Visit: Payer: Self-pay | Admitting: General Surgery

## 2015-06-09 ENCOUNTER — Encounter (HOSPITAL_COMMUNITY): Payer: Self-pay

## 2015-06-09 ENCOUNTER — Encounter (HOSPITAL_COMMUNITY)
Admission: RE | Admit: 2015-06-09 | Discharge: 2015-06-09 | Disposition: A | Discharge status: Home / Self Care | Payer: 59 | Source: Ambulatory Visit | Attending: General Surgery | Admitting: General Surgery

## 2015-06-09 DIAGNOSIS — Z01812 Encounter for preprocedural laboratory examination: Secondary | ICD-10-CM | POA: Diagnosis not present

## 2015-06-09 DIAGNOSIS — I10 Essential (primary) hypertension: Secondary | ICD-10-CM | POA: Diagnosis not present

## 2015-06-09 HISTORY — DX: Sleep apnea, unspecified: G47.30

## 2015-06-09 HISTORY — DX: Depression, unspecified: F32.A

## 2015-06-09 HISTORY — DX: Major depressive disorder, single episode, unspecified: F32.9

## 2015-06-09 HISTORY — DX: Reserved for inherently not codable concepts without codable children: IMO0001

## 2015-06-09 HISTORY — DX: Personal history of other infectious and parasitic diseases: Z86.19

## 2015-06-09 HISTORY — DX: Presence of spectacles and contact lenses: Z97.3

## 2015-06-09 HISTORY — DX: Personal history of pneumonia (recurrent): Z87.01

## 2015-06-09 HISTORY — DX: Anxiety disorder, unspecified: F41.9

## 2015-06-09 HISTORY — DX: Unspecified osteoarthritis, unspecified site: M19.90

## 2015-06-09 LAB — CBC
HCT: 49.5 % (ref 39.0–52.0)
HEMOGLOBIN: 16.6 g/dL (ref 13.0–17.0)
MCH: 31.7 pg (ref 26.0–34.0)
MCHC: 33.5 g/dL (ref 30.0–36.0)
MCV: 94.6 fL (ref 78.0–100.0)
PLATELETS: 175 10*3/uL (ref 150–400)
RBC: 5.23 MIL/uL (ref 4.22–5.81)
RDW: 15.4 % (ref 11.5–15.5)
WBC: 10.9 10*3/uL — ABNORMAL HIGH (ref 4.0–10.5)

## 2015-06-09 LAB — BASIC METABOLIC PANEL
Anion gap: 8 (ref 5–15)
BUN: 16 mg/dL (ref 6–20)
CHLORIDE: 105 mmol/L (ref 101–111)
CO2: 27 mmol/L (ref 22–32)
CREATININE: 1.11 mg/dL (ref 0.61–1.24)
Calcium: 9.2 mg/dL (ref 8.9–10.3)
GFR calc Af Amer: >60 mL/min (ref >60)
GFR calc non Af Amer: >60 mL/min (ref >60)
GLUCOSE: 105 mg/dL — AB (ref 65–99)
Potassium: 4.5 mmol/L (ref 3.5–5.1)
SODIUM: 140 mmol/L (ref 135–145)

## 2015-06-09 MED ORDER — CHLORHEXIDINE GLUCONATE 4 % EX LIQD: 1.0000 "application " | Freq: Once | CUTANEOUS | Status: DC

## 2015-06-09 NOTE — Progress Notes (Signed)
PCP- Dr. Tedra Sen[place] Cardiologist - Pt. States he has seen someone at [facility] in [place] in the past but only once.  Patient was not able to state which doctor.  EKG- 06/09/15 - Epic CXR - January 2016 - 1 view - Epic  Echo-January 2016 - Epic Stress Test - Requested  Cardiac Cath - denies Sleep Study - Epic  Patient denies shortness of breath and chest pain at PAT appointment.    Patient states that he has received bowel prep instructions from Dr. Billey Chang office and verbalizes understanding.

## 2015-06-18 MED ORDER — ALVIMOPAN 12 MG PO CAPS
12.0000 mg | ORAL_CAPSULE | Freq: Once | ORAL | Status: AC
  Administered 2015-06-19: 12 mg via ORAL
  Filled 2015-06-18: qty 1

## 2015-06-18 MED ORDER — DEXTROSE 5 % IV SOLN
2.0000 g | INTRAVENOUS | Status: AC
  Administered 2015-06-19: 2 g via INTRAVENOUS
  Filled 2015-06-18: qty 2

## 2015-06-18 MED ORDER — CHLORHEXIDINE GLUCONATE 4 % EX LIQD: 1.0000 "application " | Freq: Once | CUTANEOUS | Status: DC

## 2015-06-19 ENCOUNTER — Encounter (HOSPITAL_COMMUNITY)
Admission: AD | Disposition: A | Discharge status: Home Health | Payer: Self-pay | Source: Ambulatory Visit | Attending: General Surgery

## 2015-06-19 ENCOUNTER — Encounter (HOSPITAL_COMMUNITY): Payer: Self-pay | Admitting: *Deleted

## 2015-06-19 ENCOUNTER — Inpatient Hospital Stay (HOSPITAL_COMMUNITY): Payer: 59 | Admitting: Anesthesiology

## 2015-06-19 ENCOUNTER — Inpatient Hospital Stay (HOSPITAL_COMMUNITY)
Admission: AD | Admit: 2015-06-19 | Discharge: 2015-06-25 | DRG: 330 | Disposition: A | Discharge status: Home Health | Payer: 59 | Source: Ambulatory Visit | Attending: General Surgery | Admitting: General Surgery

## 2015-06-19 DIAGNOSIS — Z9889 Other specified postprocedural states: Secondary | ICD-10-CM

## 2015-06-19 DIAGNOSIS — E039 Hypothyroidism, unspecified: Secondary | ICD-10-CM | POA: Diagnosis present

## 2015-06-19 DIAGNOSIS — Z888 Allergy status to other drugs, medicaments and biological substances status: Secondary | ICD-10-CM

## 2015-06-19 DIAGNOSIS — Z433 Encounter for attention to colostomy: Principal | ICD-10-CM

## 2015-06-19 DIAGNOSIS — Z6841 Body Mass Index (BMI) 40.0 and over, adult: Secondary | ICD-10-CM

## 2015-06-19 DIAGNOSIS — I10 Essential (primary) hypertension: Secondary | ICD-10-CM | POA: Diagnosis present

## 2015-06-19 DIAGNOSIS — Z880 Allergy status to penicillin: Secondary | ICD-10-CM | POA: Diagnosis not present

## 2015-06-19 DIAGNOSIS — G4733 Obstructive sleep apnea (adult) (pediatric): Secondary | ICD-10-CM | POA: Diagnosis present

## 2015-06-19 HISTORY — PX: COLOSTOMY TAKEDOWN: SHX5783

## 2015-06-19 SURGERY — CLOSURE, COLOSTOMY
Anesthesia: General | Site: Abdomen

## 2015-06-19 MED ORDER — LEVOTHYROXINE SODIUM 50 MCG PO TABS
175.0000 ug | ORAL_TABLET | Freq: Every day | ORAL | Status: DC
  Administered 2015-06-20 – 2015-06-25 (×6): 175 ug via ORAL
  Filled 2015-06-19 (×18): qty 1

## 2015-06-19 MED ORDER — VECURONIUM BROMIDE 10 MG IV SOLR
INTRAVENOUS | Status: AC
  Filled 2015-06-19: qty 10

## 2015-06-19 MED ORDER — PANTOPRAZOLE SODIUM 40 MG IV SOLR
40.0000 mg | Freq: Every day | INTRAVENOUS | Status: DC
  Administered 2015-06-19 – 2015-06-24 (×6): 40 mg via INTRAVENOUS
  Filled 2015-06-19 (×6): qty 40

## 2015-06-19 MED ORDER — PROPOFOL 10 MG/ML IV BOLUS
INTRAVENOUS | Status: AC
  Filled 2015-06-19: qty 20

## 2015-06-19 MED ORDER — CHLORHEXIDINE GLUCONATE 0.12 % MT SOLN
15.0000 mL | Freq: Two times a day (BID) | OROMUCOSAL | Status: DC
  Administered 2015-06-19 – 2015-06-23 (×9): 15 mL via OROMUCOSAL
  Filled 2015-06-19 (×8): qty 15

## 2015-06-19 MED ORDER — MEPERIDINE HCL 25 MG/ML IJ SOLN: 6.2500 mg | INTRAMUSCULAR | Status: DC | PRN

## 2015-06-19 MED ORDER — SUCCINYLCHOLINE CHLORIDE 20 MG/ML IJ SOLN
INTRAMUSCULAR | Status: AC
  Filled 2015-06-19: qty 1

## 2015-06-19 MED ORDER — EPHEDRINE SULFATE 50 MG/ML IJ SOLN
INTRAMUSCULAR | Status: AC
  Filled 2015-06-19: qty 1

## 2015-06-19 MED ORDER — SUCCINYLCHOLINE CHLORIDE 20 MG/ML IJ SOLN
INTRAMUSCULAR | Status: DC | PRN
  Administered 2015-06-19: 160 mg via INTRAVENOUS

## 2015-06-19 MED ORDER — LIDOCAINE HCL (CARDIAC) 20 MG/ML IV SOLN
INTRAVENOUS | Status: DC | PRN
  Administered 2015-06-19: 100 mg via INTRAVENOUS

## 2015-06-19 MED ORDER — ONDANSETRON HCL 4 MG/2ML IJ SOLN
INTRAMUSCULAR | Status: AC
  Filled 2015-06-19: qty 2

## 2015-06-19 MED ORDER — NEOSTIGMINE METHYLSULFATE 10 MG/10ML IV SOLN
INTRAVENOUS | Status: DC | PRN
  Administered 2015-06-19: 5 mg via INTRAVENOUS

## 2015-06-19 MED ORDER — HYDROMORPHONE HCL 1 MG/ML IJ SOLN
INTRAMUSCULAR | Status: AC
  Administered 2015-06-19: 0.5 mg via INTRAVENOUS
  Filled 2015-06-19: qty 1

## 2015-06-19 MED ORDER — FENTANYL CITRATE (PF) 250 MCG/5ML IJ SOLN
INTRAMUSCULAR | Status: AC
  Filled 2015-06-19: qty 5

## 2015-06-19 MED ORDER — PROPOFOL 10 MG/ML IV BOLUS
INTRAVENOUS | Status: DC | PRN
  Administered 2015-06-19: 150 mg via INTRAVENOUS

## 2015-06-19 MED ORDER — ALBUTEROL SULFATE (2.5 MG/3ML) 0.083% IN NEBU: 2.5000 mg | INHALATION_SOLUTION | RESPIRATORY_TRACT | Status: DC | PRN

## 2015-06-19 MED ORDER — VECURONIUM BROMIDE 10 MG IV SOLR
INTRAVENOUS | Status: DC | PRN
  Administered 2015-06-19 (×2): 5 mg via INTRAVENOUS

## 2015-06-19 MED ORDER — PHENYLEPHRINE 40 MCG/ML (10ML) SYRINGE FOR IV PUSH (FOR BLOOD PRESSURE SUPPORT)
PREFILLED_SYRINGE | INTRAVENOUS | Status: AC
  Filled 2015-06-19: qty 10

## 2015-06-19 MED ORDER — 0.9 % SODIUM CHLORIDE (POUR BTL) OPTIME
TOPICAL | Status: DC | PRN
  Administered 2015-06-19 (×2): 2000 mL

## 2015-06-19 MED ORDER — DEXAMETHASONE SODIUM PHOSPHATE 4 MG/ML IJ SOLN
INTRAMUSCULAR | Status: DC | PRN
  Administered 2015-06-19: 4 mg via INTRAVENOUS

## 2015-06-19 MED ORDER — PROMETHAZINE HCL 25 MG/ML IJ SOLN: 6.2500 mg | INTRAMUSCULAR | Status: DC | PRN

## 2015-06-19 MED ORDER — FENTANYL CITRATE (PF) 100 MCG/2ML IJ SOLN
INTRAMUSCULAR | Status: DC | PRN
  Administered 2015-06-19 (×3): 50 ug via INTRAVENOUS
  Administered 2015-06-19: 150 ug via INTRAVENOUS
  Administered 2015-06-19 (×2): 50 ug via INTRAVENOUS
  Administered 2015-06-19: 25 ug via INTRAVENOUS

## 2015-06-19 MED ORDER — LIDOCAINE HCL (CARDIAC) 20 MG/ML IV SOLN
INTRAVENOUS | Status: AC
  Filled 2015-06-19: qty 5

## 2015-06-19 MED ORDER — SODIUM CHLORIDE 0.9 % IJ SOLN
INTRAMUSCULAR | Status: AC
  Filled 2015-06-19: qty 10

## 2015-06-19 MED ORDER — CEFOTETAN DISODIUM-DEXTROSE 2-2.08 GM-% IV SOLR
INTRAVENOUS | Status: AC
  Filled 2015-06-19: qty 50

## 2015-06-19 MED ORDER — FUROSEMIDE 20 MG PO TABS
20.0000 mg | ORAL_TABLET | Freq: Every day | ORAL | Status: DC
  Administered 2015-06-20 – 2015-06-25 (×6): 20 mg via ORAL
  Filled 2015-06-19 (×6): qty 1

## 2015-06-19 MED ORDER — STERILE WATER FOR INJECTION IJ SOLN
INTRAMUSCULAR | Status: AC
  Filled 2015-06-19: qty 10

## 2015-06-19 MED ORDER — CETYLPYRIDINIUM CHLORIDE 0.05 % MT LIQD
7.0000 mL | Freq: Two times a day (BID) | OROMUCOSAL | Status: DC
  Administered 2015-06-19 – 2015-06-23 (×8): 7 mL via OROMUCOSAL

## 2015-06-19 MED ORDER — PHENYLEPHRINE HCL 10 MG/ML IJ SOLN
INTRAMUSCULAR | Status: DC | PRN
  Administered 2015-06-19 (×4): 40 ug via INTRAVENOUS

## 2015-06-19 MED ORDER — HEPARIN SODIUM (PORCINE) 5000 UNIT/ML IJ SOLN
5000.0000 [IU] | Freq: Three times a day (TID) | INTRAMUSCULAR | Status: DC
Start: 2015-06-20 — End: 2015-06-25
  Administered 2015-06-20 – 2015-06-25 (×16): 5000 [IU] via SUBCUTANEOUS
  Filled 2015-06-19 (×15): qty 1

## 2015-06-19 MED ORDER — ONDANSETRON 4 MG PO TBDP: 4.0000 mg | ORAL_TABLET | Freq: Four times a day (QID) | ORAL | Status: DC | PRN

## 2015-06-19 MED ORDER — ACETAMINOPHEN 10 MG/ML IV SOLN
1000.0000 mg | INTRAVENOUS | Status: AC
  Administered 2015-06-19: 1000 mg via INTRAVENOUS
  Filled 2015-06-19: qty 100

## 2015-06-19 MED ORDER — DEXAMETHASONE SODIUM PHOSPHATE 4 MG/ML IJ SOLN
INTRAMUSCULAR | Status: AC
  Filled 2015-06-19: qty 1

## 2015-06-19 MED ORDER — LACTATED RINGERS IV SOLN
INTRAVENOUS | Status: DC | PRN
  Administered 2015-06-19 (×3): via INTRAVENOUS

## 2015-06-19 MED ORDER — ONDANSETRON HCL 4 MG/2ML IJ SOLN: 4.0000 mg | Freq: Four times a day (QID) | INTRAMUSCULAR | Status: DC | PRN

## 2015-06-19 MED ORDER — HYDROMORPHONE HCL 1 MG/ML IJ SOLN
INTRAMUSCULAR | Status: AC
  Filled 2015-06-19: qty 1

## 2015-06-19 MED ORDER — MIDAZOLAM HCL 5 MG/5ML IJ SOLN
INTRAMUSCULAR | Status: DC | PRN
  Administered 2015-06-19: 2 mg via INTRAVENOUS

## 2015-06-19 MED ORDER — ALBUTEROL SULFATE HFA 108 (90 BASE) MCG/ACT IN AERS: 1.0000 | INHALATION_SPRAY | RESPIRATORY_TRACT | Status: DC | PRN

## 2015-06-19 MED ORDER — ACETAMINOPHEN 10 MG/ML IV SOLN
INTRAVENOUS | Status: AC
Start: 2015-06-19 — End: 2015-06-20
  Filled 2015-06-19: qty 100

## 2015-06-19 MED ORDER — ROCURONIUM BROMIDE 50 MG/5ML IV SOLN
INTRAVENOUS | Status: AC
  Filled 2015-06-19: qty 1

## 2015-06-19 MED ORDER — GLYCOPYRROLATE 0.2 MG/ML IJ SOLN
INTRAMUSCULAR | Status: DC | PRN
  Administered 2015-06-19: 0.6 mg via INTRAVENOUS

## 2015-06-19 MED ORDER — ALBUMIN HUMAN 5 % IV SOLN
INTRAVENOUS | Status: DC | PRN
  Administered 2015-06-19: 10:00:00 via INTRAVENOUS

## 2015-06-19 MED ORDER — HYDROMORPHONE HCL 1 MG/ML IJ SOLN
0.2500 mg | INTRAMUSCULAR | Status: DC | PRN
  Administered 2015-06-19: 0.25 mg via INTRAVENOUS
  Administered 2015-06-19 (×2): 0.5 mg via INTRAVENOUS

## 2015-06-19 MED ORDER — ALVIMOPAN 12 MG PO CAPS
12.0000 mg | ORAL_CAPSULE | Freq: Two times a day (BID) | ORAL | Status: DC
  Administered 2015-06-20 – 2015-06-24 (×8): 12 mg via ORAL
  Filled 2015-06-19 (×8): qty 1

## 2015-06-19 MED ORDER — ACETAMINOPHEN 10 MG/ML IV SOLN
1000.0000 mg | Freq: Once | INTRAVENOUS | Status: AC
  Administered 2015-06-19: 1000 mg via INTRAVENOUS
  Filled 2015-06-19: qty 100

## 2015-06-19 MED ORDER — METOPROLOL TARTRATE 1 MG/ML IV SOLN
INTRAVENOUS | Status: AC
  Filled 2015-06-19: qty 5

## 2015-06-19 MED ORDER — METOPROLOL TARTRATE 1 MG/ML IV SOLN
5.0000 mg | Freq: Four times a day (QID) | INTRAVENOUS | Status: DC
  Administered 2015-06-19 – 2015-06-25 (×23): 5 mg via INTRAVENOUS
  Filled 2015-06-19 (×21): qty 5

## 2015-06-19 MED ORDER — GLYCOPYRROLATE 0.2 MG/ML IJ SOLN
INTRAMUSCULAR | Status: AC
  Filled 2015-06-19: qty 1

## 2015-06-19 MED ORDER — KCL IN DEXTROSE-NACL 20-5-0.9 MEQ/L-%-% IV SOLN
INTRAVENOUS | Status: DC
  Administered 2015-06-19: 23:00:00 via INTRAVENOUS
  Administered 2015-06-19: 100 mL/h via INTRAVENOUS
  Administered 2015-06-20 – 2015-06-22 (×5): via INTRAVENOUS
  Filled 2015-06-19 (×16): qty 1000

## 2015-06-19 MED ORDER — ONDANSETRON HCL 4 MG/2ML IJ SOLN
INTRAMUSCULAR | Status: DC | PRN
  Administered 2015-06-19: 4 mg via INTRAVENOUS

## 2015-06-19 MED ORDER — METOPROLOL TARTRATE 1 MG/ML IV SOLN
INTRAVENOUS | Status: DC | PRN
  Administered 2015-06-19 (×2): 1 mg via INTRAVENOUS

## 2015-06-19 MED ORDER — ROCURONIUM BROMIDE 100 MG/10ML IV SOLN
INTRAVENOUS | Status: DC | PRN
  Administered 2015-06-19: 50 mg via INTRAVENOUS

## 2015-06-19 MED ORDER — MIDAZOLAM HCL 2 MG/2ML IJ SOLN
INTRAMUSCULAR | Status: AC
  Filled 2015-06-19: qty 4

## 2015-06-19 MED ORDER — MORPHINE SULFATE (PF) 2 MG/ML IV SOLN
1.0000 mg | INTRAVENOUS | Status: DC | PRN
  Administered 2015-06-19 – 2015-06-22 (×7): 2 mg via INTRAVENOUS
  Administered 2015-06-23: 4 mg via INTRAVENOUS
  Administered 2015-06-24 – 2015-06-25 (×3): 2 mg via INTRAVENOUS
  Filled 2015-06-19 (×2): qty 1
  Filled 2015-06-19: qty 2
  Filled 2015-06-19 (×4): qty 1
  Filled 2015-06-19: qty 2
  Filled 2015-06-19 (×3): qty 1

## 2015-06-19 SURGICAL SUPPLY — 54 items
BLADE SURG ROTATE 9660 (MISCELLANEOUS) ×3 IMPLANT
CANISTER SUCTION 2500CC (MISCELLANEOUS) ×3 IMPLANT
CHLORAPREP W/TINT 26ML (MISCELLANEOUS) IMPLANT
COVER MAYO STAND STRL (DRAPES) ×6 IMPLANT
COVER SURGICAL LIGHT HANDLE (MISCELLANEOUS) ×6 IMPLANT
DRAPE LAPAROSCOPIC ABDOMINAL (DRAPES) ×3 IMPLANT
DRAPE PROXIMA HALF (DRAPES) IMPLANT
DRAPE UTILITY XL STRL (DRAPES) ×6 IMPLANT
DRAPE WARM FLUID 44X44 (DRAPE) ×3 IMPLANT
DRSG OPSITE POSTOP 4X10 (GAUZE/BANDAGES/DRESSINGS) IMPLANT
DRSG OPSITE POSTOP 4X8 (GAUZE/BANDAGES/DRESSINGS) IMPLANT
DRSG VAC ATS LRG SENSATRAC (GAUZE/BANDAGES/DRESSINGS) ×3 IMPLANT
ELECT BLADE 6.5 EXT (BLADE) ×3 IMPLANT
ELECT CAUTERY BLADE 6.4 (BLADE) ×6 IMPLANT
ELECT REM PT RETURN 9FT ADLT (ELECTROSURGICAL) ×3
ELECTRODE REM PT RTRN 9FT ADLT (ELECTROSURGICAL) ×1 IMPLANT
GLOVE BIO SURGEON STRL SZ7.5 (GLOVE) ×12 IMPLANT
GLOVE BIOGEL PI IND STRL 7.0 (GLOVE) ×2 IMPLANT
GLOVE BIOGEL PI INDICATOR 7.0 (GLOVE) ×4
GLOVE ECLIPSE 6.5 STRL STRAW (GLOVE) ×3 IMPLANT
GLOVE SURG ORTHO 8.0 STRL STRW (GLOVE) ×6 IMPLANT
GLOVE SURG SS PI 7.0 STRL IVOR (GLOVE) ×3 IMPLANT
GOWN STRL REUS W/ TWL LRG LVL3 (GOWN DISPOSABLE) ×3 IMPLANT
GOWN STRL REUS W/TWL LRG LVL3 (GOWN DISPOSABLE) ×6
GOWN STRL REUS W/TWL XL LVL3 (GOWN DISPOSABLE) ×9 IMPLANT
KIT BASIN OR (CUSTOM PROCEDURE TRAY) ×3 IMPLANT
KIT ROOM TURNOVER OR (KITS) ×3 IMPLANT
LEGGING LITHOTOMY PAIR STRL (DRAPES) ×3 IMPLANT
NS IRRIG 1000ML POUR BTL (IV SOLUTION) ×6 IMPLANT
PACK GENERAL/GYN (CUSTOM PROCEDURE TRAY) ×3 IMPLANT
PAD ARMBOARD 7.5X6 YLW CONV (MISCELLANEOUS) ×6 IMPLANT
PENCIL BUTTON HOLSTER BLD 10FT (ELECTRODE) ×3 IMPLANT
SPECIMEN JAR MEDIUM (MISCELLANEOUS) IMPLANT
SPONGE LAP 18X18 X RAY DECT (DISPOSABLE) ×3 IMPLANT
STAPLER VISISTAT 35W (STAPLE) ×3 IMPLANT
SUCTION POOLE TIP (SUCTIONS) ×3 IMPLANT
SUT NOVA 1 T20/GS 25DT (SUTURE) ×9 IMPLANT
SUT PDS AB 1 TP1 96 (SUTURE) ×6 IMPLANT
SUT PROLENE 2 0 CT2 30 (SUTURE) IMPLANT
SUT PROLENE 2 0 KS (SUTURE) IMPLANT
SUT PROLENE 2 0 SH 30 (SUTURE) ×3 IMPLANT
SUT SILK 2 0 SH CR/8 (SUTURE) ×3 IMPLANT
SUT SILK 2 0 TIES 10X30 (SUTURE) ×3 IMPLANT
SUT SILK 3 0 SH CR/8 (SUTURE) ×3 IMPLANT
SUT SILK 3 0 TIES 10X30 (SUTURE) ×3 IMPLANT
SYR BULB IRRIGATION 50ML (SYRINGE) ×3 IMPLANT
TOWEL OR 17X26 10 PK STRL BLUE (TOWEL DISPOSABLE) ×6 IMPLANT
TRAY FOLEY CATH 14FRSI W/METER (CATHETERS) ×3 IMPLANT
TRAY PROCTOSCOPIC FIBER OPTIC (SET/KITS/TRAYS/PACK) ×3 IMPLANT
TUBE CONNECTING 12'X1/4 (SUCTIONS) ×1
TUBE CONNECTING 12X1/4 (SUCTIONS) ×2 IMPLANT
UNDERPAD 30X30 INCONTINENT (UNDERPADS AND DIAPERS) ×3 IMPLANT
WATER STERILE IRR 1000ML POUR (IV SOLUTION) IMPLANT
YANKAUER SUCT BULB TIP NO VENT (SUCTIONS) ×3 IMPLANT

## 2015-06-19 NOTE — Interval H&P Note (Signed)
History and Physical Interval Note:  06/19/2015 7:11 AM  William [name]  has presented today for surgery, with the diagnosis of Sigmoid Perforation  The various methods of treatment have been discussed with the patient and family. After consideration of risks, benefits and other options for treatment, the patient has consented to  Procedure(s): COLOSTOMY TAKEDOWN (N/A) as a surgical intervention .  The patient's history has been reviewed, patient examined, no change in status, stable for surgery.  I have reviewed the patient's chart and labs.  Questions were answered to the patient's satisfaction.     TOTH III,Sherrina Zaugg S

## 2015-06-19 NOTE — Addendum Note (Signed)
Addendum  created 06/19/15 1146 by Lovie Chol, CRNA   Modules edited: Anesthesia Medication Administration

## 2015-06-19 NOTE — Anesthesia Postprocedure Evaluation (Signed)
Anesthesia Post Note  Patient: William [name]  Procedure(s) Performed: Procedure(s) (LRB): COLOSTOMY TAKEDOWN (N/A)  Anesthesia type: General  Patient location: PACU  Post pain: Pain level controlled  Post assessment: Post-op Vital signs reviewed  Last Vitals: BP 125/93 mmHg  Pulse 96  Temp(Src) 37.3 C  Resp 20  Ht  (1.803 m)  Wt 292 lb (132.45 kg)  BMI 40.74 kg/m2  SpO2 96%  Post vital signs: Reviewed  Level of consciousness: sedated  Complications: No apparent anesthesia complications

## 2015-06-19 NOTE — Progress Notes (Signed)
Patient placed himself on CPAP and is resting comfortably.

## 2015-06-19 NOTE — Anesthesia Preprocedure Evaluation (Addendum)
Anesthesia Evaluation  Patient identified by MRN, date of birth, ID band Patient awake    Reviewed: Allergy & Precautions, NPO status , Patient's Chart, lab work & pertinent test results  History of Anesthesia Complications Negative for: history of anesthetic complications  Airway Mallampati: II  TM Distance: >3 FB Neck ROM: Limited    Dental  (+) Edentulous Upper, Edentulous Lower, Dental Advisory Given   Pulmonary sleep apnea and Continuous Positive Airway Pressure Ventilation , COPD COPD inhaler, former smoker,    Pulmonary exam normal       Cardiovascular hypertension, Pt. on medications and Pt. on home beta blockers Normal cardiovascular exam    Neuro/Psych Anxiety Depression negative neurological ROS     GI/Hepatic Neg liver ROS, GERD-  Controlled and Medicated,  Endo/Other  Hypothyroidism Morbid obesity  Renal/GU Kidney stones     Musculoskeletal   Abdominal   Peds  Hematology   Anesthesia Other Findings   Reproductive/Obstetrics negative OB ROS                          Anesthesia Physical Anesthesia Plan  ASA: III  Anesthesia Plan: General   Post-op Pain Management:    Induction: Intravenous  Airway Management Planned: Oral ETT  Additional Equipment:   Intra-op Plan:   Post-operative Plan: Extubation in OR  Informed Consent: I have reviewed the patients History and Physical, chart, labs and discussed the procedure including the risks, benefits and alternatives for the proposed anesthesia with the patient or authorized representative who has indicated his/her understanding and acceptance.     Plan Discussed with: CRNA and Surgeon  Anesthesia Plan Comments:         Anesthesia Quick Evaluation

## 2015-06-19 NOTE — Transfer of Care (Signed)
Immediate Anesthesia Transfer of Care Note  Patient: William [name]  Procedure(s) Performed: Procedure(s): COLOSTOMY TAKEDOWN (N/A)  Patient Location: PACU  Anesthesia Type:General  Level of Consciousness: awake, oriented and patient cooperative  Airway & Oxygen Therapy: Patient Spontanous Breathing and Patient connected to face mask oxygen  Post-op Assessment: Report given to RN and Post -op Vital signs reviewed and stable  Post vital signs: Reviewed  Last Vitals:  Filed Vitals:   06/19/15 0557  BP: 125/93  Pulse: 96  Temp: 36.4 C  Resp: 20    Complications: No apparent anesthesia complications

## 2015-06-19 NOTE — H&P (Signed)
William [name] 05/08/2015 9:15 AM Location: [facility] Patient #: [id number] DOB: [birth year] Married / Language: [place] / Race: White Male  History of Present Illness William [name]. William Edouard MD; 05/08/2015 9:44 AM) Patient words: reck.  The patient is a 64 year old male who presents for a follow-up for Abdominal pain. The patient is a 64 year old white male who is 6 months status post sigmoid colectomy and colostomy for a sigmoid perforation secondary to diverticulitis. He is doing well but is ready to have his colostomy reversed. Since his last visit he underwent a colonoscopy that by his report was clean.   Allergies (William [name], CMA; 05/08/2015 9:16 AM) Aloprim *GOUT AGENTS* Simvastatin *ANTIHYPERLIPIDEMICS* Penicillin G Pot in Dextrose *PENICILLINS*  Medication History (William [name], CMA; 05/08/2015 9:16 AM) Vitamin D (1000UNIT Capsule, Oral) Active. Lasix (  Tablet, Oral) Active. Levothyroxine Sodium ( Tablet, Oral) Active. Metoprolol Tartrate (  Tablet, Oral) Active. accuflora probiotic Active. Medications Reconciled  Review of Systems William Fickle S. William Edouard MD; 05/08/2015 9:44 AM) General Not Present- Appetite Loss, Chills, Fatigue, Fever, Night Sweats, Weight Gain and Weight Loss. Skin Not Present- Change in Wart/Mole, Dryness, Hives, Jaundice, New Lesions, Non-Healing Wounds, Rash and Ulcer. HEENT Not Present- Earache, Hearing Loss, Hoarseness, Nose Bleed, Oral Ulcers, Ringing in the Ears, Seasonal Allergies, Sinus Pain, Sore Throat, Visual Disturbances, Wears glasses/contact lenses and Yellow Eyes. Respiratory Not Present- Bloody sputum, Chronic Cough, Difficulty Breathing, Snoring and Wheezing. Breast Not Present- Breast Mass, Breast Pain, Nipple Discharge and Skin Changes. Cardiovascular Not Present- Chest Pain, Difficulty Breathing Lying Down, Leg Cramps, Palpitations, Rapid Heart Rate, Shortness of Breath and Swelling of Extremities. Gastrointestinal Not  Present- Abdominal Pain, Bloating, Bloody Stool, Change in Bowel Habits, Chronic diarrhea, Constipation, Difficulty Swallowing, Excessive gas, Gets full quickly at meals, Hemorrhoids, Indigestion, Nausea, Rectal Pain and Vomiting. Male Genitourinary Not Present- Blood in Urine, Change in Urinary Stream, Frequency, Impotence, Nocturia, Painful Urination, Urgency and Urine Leakage. Musculoskeletal Not Present- Back Pain, Joint Pain, Joint Stiffness, Muscle Pain, Muscle Weakness and Swelling of Extremities. Neurological Not Present- Decreased Memory, Fainting, Headaches, Numbness, Seizures, Tingling, Tremor, Trouble walking and Weakness. Psychiatric Not Present- Anxiety, Bipolar, Change in Sleep Pattern, Depression, Fearful and Frequent crying. Endocrine Not Present- Cold Intolerance, Excessive Hunger, Hair Changes, Heat Intolerance and New Diabetes. Hematology Not Present- Easy Bruising, Excessive bleeding, Gland problems, HIV and Persistent Infections.   Vitals (William [name] CMA; 05/08/2015 9:16 AM) 05/08/2015 9:15 AM Weight: 293 lb Height: 71in Body Surface Area: 2.58 m Body Mass Index: 40.86 kg/m Temp.: 86F(Temporal)  Pulse: 90 (Regular)  BP: 138/72 (Sitting, Left Arm, Standard)    Physical Exam William Fickle S. William Edouard MD; 05/08/2015 9:55 AM) General Mental Status-Alert. General Appearance-Consistent with stated age. Hydration-Well hydrated. Voice-Normal.  Head and Neck Head-normocephalic, atraumatic with no lesions or palpable masses. Trachea-midline. Thyroid Gland Characteristics - normal size and consistency.  Eye Eyeball - Bilateral-Extraocular movements intact. Sclera/Conjunctiva - Bilateral-No scleral icterus.  Chest and Lung Exam Chest and lung exam reveals -quiet, even and easy respiratory effort with no use of accessory muscles and on auscultation, normal breath sounds, no adventitious sounds and normal vocal resonance. Inspection Chest Wall -  Normal. Back - normal.  Cardiovascular Cardiovascular examination reveals -normal heart sounds, regular rate and rhythm with no murmurs and normal pedal pulses bilaterally.  Abdomen Note: The abdomen is soft and nontender. There is still a quarter size area of the midportion of the wound that is open with good granulation tissue but the rest of the incision has  healed. The ostomy is pink and productive.   Neurologic Neurologic evaluation reveals -alert and oriented x 3 with no impairment of recent or remote memory. Mental Status-Normal.  Musculoskeletal Normal Exam - Left-Upper Extremity Strength Normal and Lower Extremity Strength Normal. Normal Exam - Right-Upper Extremity Strength Normal and Lower Extremity Strength Normal.  Lymphatic Head & Neck  General Head & Neck Lymphatics: Bilateral - Description - Normal. Axillary  General Axillary Region: Bilateral - Description - Normal. Tenderness - Non Tender. Femoral & Inguinal  Generalized Femoral & Inguinal Lymphatics: Bilateral - Description - Normal. Tenderness - Non Tender.    Assessment & Plan William Fickle S. William Edouard MD; 05/08/2015 9:56 AM) PERFORATED SIGMOID COLON (569.83  K63.1) Impression: The patient is about 6 months status post sigmoid colectomy with colostomy for perforation secondary to diverticulitis. At this point he is ready to have his colostomy reversed. I have discussed with him in detail the risks and benefits of the operation to reverse the colostomy as well as some of the technical aspects including the risk of leak and he understands and wishes to proceed     Signed by William Essex, MD (05/08/2015 9:56 AM)

## 2015-06-19 NOTE — Anesthesia Procedure Notes (Signed)
Procedure Name: Intubation Date/Time: 06/19/2015 7:40 AM Performed by: Lovie Chol Pre-anesthesia Checklist: Patient identified, Emergency Drugs available, Suction available, Patient being monitored and Timeout performed Patient Re-evaluated:Patient Re-evaluated prior to inductionOxygen Delivery Method: Circle system utilized Preoxygenation: Pre-oxygenation with 100% oxygen Intubation Type: IV induction Laryngoscope Size: Mac and 4 Grade View: Grade II Tube type: Oral Tube size: 7.5 mm Number of attempts: 1 Airway Equipment and Method: Stylet Placement Confirmation: ETT inserted through vocal cords under direct vision,  positive ETCO2,  CO2 detector and breath sounds checked- equal and bilateral Secured at: 22 cm Tube secured with: Tape Dental Injury: Teeth and Oropharynx as per pre-operative assessment  Difficulty Due To: Difficult Airway- due to large tongue and Difficult Airway- due to reduced neck mobility Future Recommendations: Recommend- induction with short-acting agent, and alternative techniques readily available Comments: PreO2 x 5 minutes. Smooth IV induction. DL x 1 Mil 3. Grade 2 view. Epiglottis dropped as ETT being inserted. Unable to regain view. DL x 1 MAC 4 by Dr Michelle Piper. Grade 2 view. Atraum intubation. +ETCO2 bbse.

## 2015-06-19 NOTE — Op Note (Addendum)
06/19/2015  10:18 AM  PATIENT:  William [name]  64 y.o. 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 Gerrit Friends   ANESTHESIA:   general  EBL:  Total I/O In: 2250 [I.V.:2000; IV Piggyback:250] Out: 200 [Urine:150; Blood:50]  BLOOD ADMINISTERED:none  DRAINS: none   LOCAL MEDICATIONS USED:  NONE  SPECIMEN:  Source of Specimen:  colostomy  DISPOSITION OF SPECIMEN:  PATHOLOGY  COUNTS:  YES  TOURNIQUET:  * No tourniquets in log *  DICTATION: .Dragon Dictation   After informed consent was obtained patient was brought to the operating room and placed in the supine position on the operating room table. After adequate induction of general anesthesia the patient was moved into lithotomy position and all pressure points are padded. The abdomen and perirectal area were all prepped with Betadine and draped in usual sterile manner. Prior to prepping the ostomy was closed with a 2-0 silk pursestring stitch. The ostomy was then covered with a blue towel. A midline incision was made with a 10 blade knife. The incision incorporated the old scar. The incision was carried through the skin and subcutaneous tissue sharply with electrocautery until the fascia of the abdominal wall was encountered. The fascia was incised with the electrocautery. The preperitoneal space was probed bluntly with a hemostat until access was gained to the abdominal cavity. Upon entering the abdominal cavity we were able to identify that there were no significant adhesions to the abdominal wall beneath the midline incision. The rest of the incision was opened under direct vision  with the electrocautery and the old scar was excised. The abdomen was examined. There were some small bowel adhesions to the pelvis but these were very filmy and were taken down either by blunt finger dissection or some sharp dissection with Metzenbaum scissors. We were then able to pack the small bowel into the upper abdomen with moist lap sponges. The rectal stump had previously been marked with Prolene stitches and was readily identified. There was a good length on the rectal stump. The rectal stump appeared healthy and mobile. We then turned our attention to the ostomy. There was some omentum adherent to the abdominal wall with the colostomy. This was taken down also by either blunt finger dissection or some sharp dissection with the electrocautery. Blunt dissection was carried out around the ostomy from the inside until the dissection was carried up close to the skin. Next an elliptical incision was made in the skin around the old ostomy with a 10 blade knife. The incision was carried through the skin and subcutaneous tissue sharply with electrocautery. This dissection was carried into the subcutaneous tissue until it reached the dissection from inside. Once this was accomplished then the ostomy was dissected free circumferentially. The ostomy was then reduced back within the abdominal cavity. The deep fascial layer of the abdominal wall was then closed with interrupted #1 Novafil stitches. At this point Dr. Gerrit Friends went below and was able to place a blunt EEA sizer into the rectum and all the way up to the staple line of the rectal stump without difficulty. There was a lot of extra length on the descending colon so I decided  to excise the colostomy about 3 inches so that the anastomosis would lie nicely. The mesentery to this segment of the descending colon was taken down by serially clamping the vessels in the mesentery with Kelly clamps, dividing and tying with 2-0 silk ties. An Allen clamp was placed on the  stay side of the descending colon and a cover clamp was placed on the go side. The colon was divided sharply with a 10 blade knife between the 2 clamps. Next a 2-0 Prolene pursestring stitch was placed around the edge of the open descending colon. We decided on a 29 EEA stapler. The anvil was then placed in the opening of the descending colon and the pursestring stitch was cinched down and tied around the post of the anvil. Next Dr. Gerrit Friends was able to place the EEA stapler into the rectum. We placed the stapler about an inch from the staple line and then had the spike, out through the anterior wall of the rectal stump. The anvil was placed on the spike in a manner to ensure that there was no twisting of the bowel. The stapling device was then closed into the green zone. This position was held for a minute and then the stapler was fired. The stapler was then opened full turn and gently removed. The staple line appeared to be intact and healthy. The staple line was reinforced with multiple interrupted 2-0 silk Lembert stitches. A soft bowel clamp was then placed just above the staple line and a rigid sigmoidoscopy was performed and the anastomosis was insufflated. The pelvis was filled with saline. There were no bubbles to indicate any sort of leak. At this point the colon and rectum were evacuated. The sponges and retractor were removed. All gowns and gloves were changed. The area around the wound was redraped with clean drapes. The abdomen was then irrigated with copious amounts of saline. All needle sponge and instrument counts were correct. The anterior fascia at the old ostomy site was closed with interrupted #1 Novafil stitches. The fascia of the anterior abdominal wall midline incision was then closed with 2 running #1 double-stranded loop PDS sutures. Because the case was a contaminated case the wounds were left open and dressed with a subcutaneous vacuum dressing. The patient tolerated the procedure well. At  the end of the case all needle sponge counts were correct. The patient was then awakened and taken to recovery in stable condition. There were no other abnormalities noted on general inspection of the abdomen during the case.  PLAN OF CARE: Admit to inpatient   PATIENT DISPOSITION:  PACU - hemodynamically stable.   Delay start of Pharmacological VTE agent (>24hrs) due to surgical blood loss or risk of bleeding: no

## 2015-06-20 ENCOUNTER — Encounter (HOSPITAL_COMMUNITY): Payer: Self-pay | Admitting: General Surgery

## 2015-06-20 LAB — CBC
HCT: 49.1 % (ref 39.0–52.0)
HEMOGLOBIN: 16.6 g/dL (ref 13.0–17.0)
MCH: 31.8 pg (ref 26.0–34.0)
MCHC: 33.8 g/dL (ref 30.0–36.0)
MCV: 94.1 fL (ref 78.0–100.0)
PLATELETS: 175 10*3/uL (ref 150–400)
RBC: 5.22 MIL/uL (ref 4.22–5.81)
RDW: 15.1 % (ref 11.5–15.5)
WBC: 13.4 10*3/uL — AB (ref 4.0–10.5)

## 2015-06-20 LAB — BASIC METABOLIC PANEL
ANION GAP: 8 (ref 5–15)
BUN: 7 mg/dL (ref 6–20)
CHLORIDE: 104 mmol/L (ref 101–111)
CO2: 23 mmol/L (ref 22–32)
Calcium: 8.5 mg/dL — ABNORMAL LOW (ref 8.9–10.3)
Creatinine, Ser: 0.98 mg/dL (ref 0.61–1.24)
Glucose, Bld: 161 mg/dL — ABNORMAL HIGH (ref 65–99)
POTASSIUM: 3.7 mmol/L (ref 3.5–5.1)
SODIUM: 135 mmol/L (ref 135–145)

## 2015-06-20 MED ORDER — CEFAZOLIN SODIUM-DEXTROSE 2-3 GM-% IV SOLR
INTRAVENOUS | Status: AC
  Filled 2015-06-20: qty 50

## 2015-06-20 MED ORDER — SILVER NITRATE-POT NITRATE 75-25 % EX MISC
1.0000 | CUTANEOUS | Status: AC
  Filled 2015-06-20 (×3): qty 1

## 2015-06-20 MED ORDER — SILVER NITRATE-POT NITRATE 75-25 % EX MISC
1.0000 | Freq: Once | CUTANEOUS | Status: AC
  Administered 2015-06-20: 1 via TOPICAL
  Filled 2015-06-20: qty 1

## 2015-06-20 MED ORDER — PROTAMINE SULFATE 10 MG/ML IV SOLN
INTRAVENOUS | Status: AC
  Filled 2015-06-20: qty 5

## 2015-06-20 NOTE — Progress Notes (Signed)
1 Day Post-Op  Subjective: No complaints. Had some bleeding from the skin edge which was controlled with silver nitrate  Objective: Vital signs in last 24 hours: Temp:  [98 F (36.7 C)-99.6 F (37.6 C)] 98.9 F (37.2 C) (09/02 0945) Pulse Rate:  [88-110] 100 (09/02 0945) Resp:  [2-20] 18 (09/02 0945) BP: (117-154)/(72-90) 146/88 mmHg (09/02 0945) SpO2:  [77 %-98 %] 93 % (09/02 0945) Weight:  [132.45 kg (292 lb)] 132.45 kg (292 lb) (09/01 1538) Last BM Date: 06/18/15  Intake/Output from previous day: 09/01 0701 - 09/02 0700 In: 4048.7 [I.V.:3698.7; IV Piggyback:350] Out: 2820 [Urine:2595; Drains:125; Blood:100] Intake/Output this shift:    Resp: clear to auscultation bilaterally Cardio: regular rate and rhythm GI: soft, appropriately tender. wound clean  Lab Results:   Recent Labs  06/20/15 0310  WBC 13.4*  HGB 16.6  HCT 49.1  PLT 175   BMET  Recent Labs  06/20/15 0310  NA 135  K 3.7  CL 104  CO2 23  GLUCOSE 161*  BUN 7  CREATININE 0.98  CALCIUM 8.5*   PT/INR No results for input(s): LABPROT, INR in the last 72 hours. ABG No results for input(s): PHART, HCO3 in the last 72 hours.  Invalid input(s): PCO2, PO2  Studies/Results: No results found.  Anti-infectives: Anti-infectives    Start     Dose/Rate Route Frequency Ordered Stop   06/19/15 0700  cefoTEtan (CEFOTAN) 2 g in dextrose 5 % 50 mL IVPB     2 g 100 mL/hr over 30 Minutes Intravenous To Surgery 06/18/15 1247 06/19/15 0742   06/19/15 0611  cefoTEtan in Dextrose 5% (CEFOTAN) 2-2.08 GM-% IVPB    Comments:  Domenica Fail   : cabinet override      06/19/15 0611 06/19/15 1814      Assessment/Plan: s/p Procedure(s): COLOSTOMY TAKEDOWN (N/A) continue ice chips  Re apply vac OOB to chair PT consult entereg  LOS: 1 day    TOTH III,Sanaz Scarlett S 06/20/2015

## 2015-06-20 NOTE — Consult Note (Signed)
WOC nurse second visit with bedside nurse per her request to assess bleeding areas in the wound bed.  Moist gauze dressing removed and revealed two areas on the lateral aspect of the proximal wound edge that area actively oozing blood.  WOC nurse attempted to achieve hemostasis with silver nitrate sticks x 2 to each site, however patient was quite uncomfortable with treatment.  WOC was not able to stop the bleeding, steady trickle from each site.   WOC contacted Dr. Carolynne Edouard in the OR with report on status and attempts to use silver nitrate, he will come to assess the wound.  I made the patient and his wife aware.  Davina Poke RN, CWOCN [phone]

## 2015-06-20 NOTE — Consult Note (Signed)
WOC arrived for NPWT VAC dressing change, however VAC dressing had been removed per the bedside nurse for excessive bleeding and she has contacted [facility]. Nurse is awaiting surgeon to assess site prior to replacement of the [facility] dressing due to bleeding. Dr. Carolynne Edouard and Dr. Magnus Ivan aware. Will have bedside nurse page me if needs to be replaced.  M.Abiageal Blowe RN,CWOCN [phone]

## 2015-06-20 NOTE — Progress Notes (Signed)
Received patient with wound vac that was going off, night nurse and writer checked site and noted it was profusely bleeding and blood clot was occluding the [place] sponge. Removed the wound vac dressing as it was not working, applied wet to dry pressure dressing. Paged Dr. Carolynne Edouard and Dr. Magnus Ivan responded and was made aware.

## 2015-06-20 NOTE — Consult Note (Signed)
WOC wound follow up Wound type: [facility] team in earlier to stop bleeding and request that Vac dressing be re-applied to 2 abd wounds. Measurement: Left outer abd with full thickness post-op wound; 3X5.5X3cm Midline abd with full thickness post-op wound; 20X3X2cm Wound bed: Both wounds are beefy red Drainage (amount, consistency, odor) Small amt pink drainage, no odor Periwound: Intact skin surrounding Dressing procedure/placement/frequency: Applied one piece of black sponge to left outer abd and one to midline abd, then bridged together with another piece of foam to cont suction.  Pt tolerated with minimal amt discomfort.  Plan dressing change on Monday.  Cammie Mcgee MSN, RN, CWOCN, [place], CNS [phone]

## 2015-06-20 NOTE — Care Management Note (Signed)
Case Management Note  Patient Details  Name: William [name] MRN: [id number] Date of Birth: 1951/08/29  Subjective/Objective:                    Action/Plan:  Confirmed face sheet information with patient and wife at bedside.   Faxed [employer] VAC application to [employer] , Animal nutritionist .  Expected Discharge Date:                  Expected Discharge Plan:  Home w Home Health Services  In-House Referral:     Discharge planning Services  CM Consult  Post Acute Care Choice:  Home Health Choice offered to:  Patient  DME Arranged:  Vac DME Agency:  [employer]  HH Arranged:  RN HH Agency:  [facility]  Status of Service:  In process, will continue to follow  Medicare Important Message Given:    Date Medicare IM Given:    Medicare IM give by:    Date Additional Medicare IM Given:    Additional Medicare Important Message give by:     If discussed at Long Length of Stay Meetings, dates discussed:    Additional Comments:  Kingsley Plan, RN 06/20/2015, 2:36 PM

## 2015-06-21 LAB — BASIC METABOLIC PANEL
ANION GAP: 7 (ref 5–15)
BUN: 8 mg/dL (ref 6–20)
CALCIUM: 8.5 mg/dL — AB (ref 8.9–10.3)
CO2: 24 mmol/L (ref 22–32)
Chloride: 105 mmol/L (ref 101–111)
Creatinine, Ser: 0.89 mg/dL (ref 0.61–1.24)
GFR calc Af Amer: >60 mL/min (ref >60)
GLUCOSE: 134 mg/dL — AB (ref 65–99)
POTASSIUM: 3.7 mmol/L (ref 3.5–5.1)
SODIUM: 136 mmol/L (ref 135–145)

## 2015-06-21 LAB — CBC
HCT: 48.5 % (ref 39.0–52.0)
Hemoglobin: 16.1 g/dL (ref 13.0–17.0)
MCH: 31.6 pg (ref 26.0–34.0)
MCHC: 33.2 g/dL (ref 30.0–36.0)
MCV: 95.1 fL (ref 78.0–100.0)
PLATELETS: 181 10*3/uL (ref 150–400)
RBC: 5.1 MIL/uL (ref 4.22–5.81)
RDW: 15.5 % (ref 11.5–15.5)
WBC: 11.7 10*3/uL — AB (ref 4.0–10.5)

## 2015-06-21 NOTE — Progress Notes (Signed)
Patient ID: William [name], male   DOB: 12-06-1950, 64 y.o.   MRN: [id number] [facility] Progress Note:   2 Days Post-Op  Subjective: Mental status is clear.  Sitting up in a chair for the first time.   Objective: Vital signs in last 24 hours: Temp:  [99 F (37.2 C)-99.9 F (37.7 C)] 99.1 F (37.3 C) (09/03 1119) Pulse Rate:  [93-106] 102 (09/03 1119) Resp:  [18-20] 18 (09/03 0739) BP: (131-155)/(78-90) 131/86 mmHg (09/03 1119) SpO2:  [92 %-94 %] 93 % (09/03 1119)  Intake/Output from previous day: 09/02 0701 - 09/03 0700 In: 2390 [I.V.:2390] Out: 3925 [Urine:3900; Drains:25] Intake/Output this shift: Total I/O In: -  Out: 700 [Urine:700]  Physical Exam: Work of breathing is not labored.  Incision has VAC in place.  No appreciable flatus yet.   Lab Results:  Results for orders placed or performed during the hospital encounter of 06/19/15 (from the past 48 hour(s))  CBC     Status: Abnormal   Collection Time: 06/20/15  3:10 AM  Result Value Ref Range   WBC 13.4 (H) 4.0 - 10.5 K/uL   RBC 5.22 4.22 - 5.81 MIL/uL   Hemoglobin 16.6 13.0 - 17.0 g/dL   HCT 49.1 39.0 - 52.0 %   MCV 94.1 78.0 - 100.0 fL   MCH 31.8 26.0 - 34.0 pg   MCHC 33.8 30.0 - 36.0 g/dL   RDW 15.1 11.5 - 15.5 %   Platelets 175 150 - 400 K/uL  Basic metabolic panel     Status: Abnormal   Collection Time: 06/20/15  3:10 AM  Result Value Ref Range   Sodium 135 135 - 145 mmol/L   Potassium 3.7 3.5 - 5.1 mmol/L   Chloride 104 101 - 111 mmol/L   CO2 23 22 - 32 mmol/L   Glucose, Bld 161 (H) 65 - 99 mg/dL   BUN 7 6 - 20 mg/dL   Creatinine, Ser 0.98 0.61 - 1.24 mg/dL   Calcium 8.5 (L) 8.9 - 10.3 mg/dL   GFR calc non Af Amer >60 >60 mL/min   GFR calc Af Amer >60 >60 mL/min    Comment: (NOTE) The eGFR has been calculated using the CKD EPI equation. This calculation has not been validated in all clinical situations. eGFR's persistently <60 mL/min signify possible Chronic Kidney Disease.    Anion  gap 8 5 - 15  CBC     Status: Abnormal   Collection Time: 06/21/15  3:01 AM  Result Value Ref Range   WBC 11.7 (H) 4.0 - 10.5 K/uL   RBC 5.10 4.22 - 5.81 MIL/uL   Hemoglobin 16.1 13.0 - 17.0 g/dL   HCT 48.5 39.0 - 52.0 %   MCV 95.1 78.0 - 100.0 fL   MCH 31.6 26.0 - 34.0 pg   MCHC 33.2 30.0 - 36.0 g/dL   RDW 15.5 11.5 - 15.5 %   Platelets 181 150 - 400 K/uL  Basic metabolic panel     Status: Abnormal   Collection Time: 06/21/15  3:01 AM  Result Value Ref Range   Sodium 136 135 - 145 mmol/L   Potassium 3.7 3.5 - 5.1 mmol/L   Chloride 105 101 - 111 mmol/L   CO2 24 22 - 32 mmol/L   Glucose, Bld 134 (H) 65 - 99 mg/dL   BUN 8 6 - 20 mg/dL   Creatinine, Ser 0.89 0.61 - 1.24 mg/dL   Calcium 8.5 (L) 8.9 - 10.3 mg/dL   GFR calc  non Af Amer >60 >60 mL/min   GFR calc Af Amer >60 >60 mL/min    Comment: (NOTE) The eGFR has been calculated using the CKD EPI equation. This calculation has not been validated in all clinical situations. eGFR's persistently <60 mL/min signify possible Chronic Kidney Disease.    Anion gap 7 5 - 15    Radiology/Results: No results found.  Anti-infectives: Anti-infectives    Start     Dose/Rate Route Frequency Ordered Stop   06/19/15 0700  cefoTEtan (CEFOTAN) 2 g in dextrose 5 % 50 mL IVPB     2 g 100 mL/hr over 30 Minutes Intravenous To Surgery 06/18/15 1247 06/19/15 0742   06/19/15 0611  cefoTEtan in Dextrose 5% (CEFOTAN) 2-2.08 GM-% IVPB    Comments:  Connye Burkitt   : cabinet override      06/19/15 7628 06/19/15 1814      Assessment/Plan: Problem List: Patient Active Problem List   Diagnosis Date Noted  . S/P colostomy takedown 06/19/2015  . S/P colostomy 11/21/2014  . Hypothyroidism 11/20/2014  . Essential hypertension 11/20/2014  . Acute on chronic respiratory failure 11/19/2014  . Bacteroides fragilis infection 11/12/2014  . Normocytic anemia 11/12/2014  . Solitary pulmonary nodule on lung CT 10/28/2014  . Obstructive sleep apnea  10/28/2014  . Diverticulitis of colon with perforation s/p colectomy/colostomy 10/28/2014 10/27/2014    Continue ice chips for now and wait for flatus.   2 Days Post-Op    LOS: 2 days   Matt B. Hassell Done, MD, [facility] [phone] beeper [phone]  06/21/2015 12:17 PM

## 2015-06-21 NOTE — Evaluation (Signed)
Physical Therapy Evaluation Patient Details Name: William [name] MRN: [id number] DOB: [birth year] Today's Date: 06/21/2015   History of Present Illness  COLOSTOMY TAKEDOWN   Clinical Impression  Patient is s/p above surgery resulting in functional limitations due to the deficits listed below (see PT Problem List).  Patient will benefit from skilled PT to increase their independence and safety with mobility to allow discharge to home with family assistance. Patient able to perform sit-stand X 2 from w/c, reports unable to attempt steps at this time. Will continue to progress mobility as tolerated.        Follow Up Recommendations Home health PT;Supervision for mobility/OOB    Equipment Recommendations  Other (comment) (reports having all equipment needs met already)    Recommendations for Other Services       Precautions / Restrictions Precautions Precautions: Fall Restrictions Weight Bearing Restrictions: No      Mobility  Bed Mobility               General bed mobility comments: not tested, found and returned to w/c  Transfers Overall transfer level: Needs assistance Equipment used: Rolling walker (2 wheeled) Transfers: Sit to/from Stand Sit to Stand: Max assist;Mod assist         General transfer comment: first stand from w/c - max assist X1 +min assist +1, second stand (elevated with pillows) mod assist +1.   Ambulation/Gait                Stairs            Wheelchair Mobility    Modified Rankin (Stroke Patients Only)       Balance Overall balance assessment: Needs assistance Sitting-balance support: No upper extremity supported Sitting balance-Leahy Scale: Fair     Standing balance support: Bilateral upper extremity supported Standing balance-Leahy Scale: Poor Standing balance comment: needing rw for standing                             Pertinent Vitals/Pain Pain Assessment: 0-10 Pain Score: 3  Pain Location:  abdomin Pain Descriptors / Indicators: Sore Pain Intervention(s): Monitored during session;Limited activity within patient's tolerance    Home Living Family/patient expects to be discharged to:: Private residence Living Arrangements: Spouse/significant other Available Help at Discharge: Family Type of Home: House Home Access: Stairs to enter Entrance Stairs-Rails: Right;Left;Can reach both Entrance Stairs-Number of Steps: 3 Home Layout: One level Home Equipment: Environmental consultant - 2 wheels;Cane - single point      Prior Function Level of Independence: Independent (occasional use of cane)               Hand Dominance        Extremity/Trunk Assessment               Lower Extremity Assessment: Generalized weakness (bilater knee pain due to arthritis L>R)         Communication   Communication: No difficulties  Cognition Arousal/Alertness: Awake/alert Behavior During Therapy: WFL for tasks assessed/performed Overall Cognitive Status: Within Functional Limits for tasks assessed                      General Comments General comments (skin integrity, edema, etc.): 60-90 second stands with seated rest between.    Exercises        Assessment/Plan    PT Assessment Patient needs continued PT services  PT Diagnosis Difficulty walking;Generalized weakness;Acute pain   PT Problem List Decreased  strength;Decreased range of motion;Decreased activity tolerance;Decreased balance;Decreased mobility;Pain  PT Treatment Interventions DME instruction;Gait training;Stair training;Functional mobility training;Therapeutic activities;Therapeutic exercise;Patient/family education   PT Goals (Current goals can be found in the Care Plan section) Acute Rehab PT Goals Patient Stated Goal: go home from hospital PT Goal Formulation: With patient Time For Goal Achievement: 07/05/15 Potential to Achieve Goals: Good    Frequency Min 3X/week   Barriers to discharge         Co-evaluation               End of Session Equipment Utilized During Treatment: Gait belt Activity Tolerance: Patient limited by pain;Patient limited by fatigue (increased knee pain and abdominal pain with standing. ) Patient left: in chair;with nursing/sitter in room;with family/visitor present;with call bell/phone within reach Nurse Communication: Mobility status         Time: 7182-0990 PT Time Calculation (min) (ACUTE ONLY): 29 min   Charges:   PT Evaluation $Initial PT Evaluation Tier I: 1 Procedure PT Treatments $Therapeutic Activity: 8-22 mins   PT G Codes:        Cassell Clement, PT, CSCS Pager [phone] Office [phone]  06/21/2015, 1:46 PM

## 2015-06-22 NOTE — Procedures (Signed)
RT checked CPAP settings and turned on per pt request.  Pt will place on self for the night when ready.  RT will be notified if pt needs additional help.

## 2015-06-22 NOTE — Progress Notes (Signed)
Patient ID: William [name], male   DOB: Oct 06, 1951, 64 y.o.   MRN: [id number] [facility] Progress Note:   3 Days Post-Op  Subjective: Mental status is clear;  Looks better than yesterday;  Passing flatus Objective: Vital signs in last 24 hours: Temp:  [99.1 F (37.3 C)-99.6 F (37.6 C)] 99.1 F (37.3 C) (09/04 0517) Pulse Rate:  [99-108] 99 (09/04 0517) Resp:  [20-24] 22 (09/04 0517) BP: (115-131)/(62-86) 122/77 mmHg (09/04 0517) SpO2:  [93 %-96 %] 94 % (09/04 0517)  Intake/Output from previous day: 09/03 0701 - 09/04 0700 In: 2336.7 [I.V.:2336.7] Out: 2050 [Urine:2050] Intake/Output this shift:    Physical Exam: Work of breathing is not labored.  Foley outl  Wound vac in place.  + flatus  Lab Results:  Results for orders placed or performed during the hospital encounter of 06/19/15 (from the past 48 hour(s))  CBC     Status: Abnormal   Collection Time: 06/21/15  3:01 AM  Result Value Ref Range   WBC 11.7 (H) 4.0 - 10.5 K/uL   RBC 5.10 4.22 - 5.81 MIL/uL   Hemoglobin 16.1 13.0 - 17.0 g/dL   HCT 48.5 39.0 - 52.0 %   MCV 95.1 78.0 - 100.0 fL   MCH 31.6 26.0 - 34.0 pg   MCHC 33.2 30.0 - 36.0 g/dL   RDW 15.5 11.5 - 15.5 %   Platelets 181 150 - 400 K/uL  Basic metabolic panel     Status: Abnormal   Collection Time: 06/21/15  3:01 AM  Result Value Ref Range   Sodium 136 135 - 145 mmol/L   Potassium 3.7 3.5 - 5.1 mmol/L   Chloride 105 101 - 111 mmol/L   CO2 24 22 - 32 mmol/L   Glucose, Bld 134 (H) 65 - 99 mg/dL   BUN 8 6 - 20 mg/dL   Creatinine, Ser 0.89 0.61 - 1.24 mg/dL   Calcium 8.5 (L) 8.9 - 10.3 mg/dL   GFR calc non Af Amer >60 >60 mL/min   GFR calc Af Amer >60 >60 mL/min    Comment: (NOTE) The eGFR has been calculated using the CKD EPI equation. This calculation has not been validated in all clinical situations. eGFR's persistently <60 mL/min signify possible Chronic Kidney Disease.    Anion gap 7 5 - 15    Radiology/Results: No results  found.  Anti-infectives: Anti-infectives    Start     Dose/Rate Route Frequency Ordered Stop   06/19/15 0700  cefoTEtan (CEFOTAN) 2 g in dextrose 5 % 50 mL IVPB     2 g 100 mL/hr over 30 Minutes Intravenous To Surgery 06/18/15 1247 06/19/15 0742   06/19/15 0611  cefoTEtan in Dextrose 5% (CEFOTAN) 2-2.08 GM-% IVPB    Comments:  Connye Burkitt   : cabinet override      06/19/15 9163 06/19/15 1814      Assessment/Plan: Problem List: Patient Active Problem List   Diagnosis Date Noted  . S/P colostomy takedown 06/19/2015  . S/P colostomy 11/21/2014  . Hypothyroidism 11/20/2014  . Essential hypertension 11/20/2014  . Acute on chronic respiratory failure 11/19/2014  . Bacteroides fragilis infection 11/12/2014  . Normocytic anemia 11/12/2014  . Solitary pulmonary nodule on lung CT 10/28/2014  . Obstructive sleep apnea 10/28/2014  . Diverticulitis of colon with perforation s/p colectomy/colostomy 10/28/2014 10/27/2014    Will offer clear liquids PO.  Appears to be making progress 3 Days Post-Op    LOS: 3 days   Matt B. Hassell Done,  MD, [facility] [phone] beeper [phone]  06/22/2015 8:10 AM

## 2015-06-22 NOTE — Progress Notes (Addendum)
Physical Therapy Treatment Patient Details Name: William [name] MRN: [id number] DOB: [birth year] Today's Date: 06/22/2015    History of Present Illness Pt is a 64 y/o M s/p colostomy takedown.  Pt's PMH includes Lt knee arthritis (per pt), HTN, hypothyroidism, COPD, depression, and anxiety.    PT Comments    William [name] was in good spirits upon PT arrival as he had just performed stand pivot to [facility] from bed w/ nursing staff.  He still requires +2 assist for management of wound vac and for pt's safety.  Pt's ambulatory distance was limited to 10 ft today 2/2 Lt knee pain 2/2 arthritis.  Pt will benefit from continued skilled PT services to increase functional independence and safety.   Follow Up Recommendations  Home health PT;Supervision for mobility/OOB     Equipment Recommendations  Other (comment) (reports having all equipment needs met already)    Recommendations for Other Services       Precautions / Restrictions Precautions Precautions: Fall Restrictions Weight Bearing Restrictions: No    Mobility  Bed Mobility               General bed mobility comments: not tested, found and returned to w/c  Transfers Overall transfer level: Needs assistance Equipment used: Rolling walker (2 wheeled) Transfers: Sit to/from Stand Sit to Stand: Min assist;+2 safety/equipment         General transfer comment: Min assist to power up to standing and stabilizing RW.  No cues necessary, pt w/ good technique.  Ambulation/Gait Ambulation/Gait assistance: Min assist;+2 safety/equipment Ambulation Distance (Feet): 10 Feet Assistive device: Rolling walker (2 wheeled) Gait Pattern/deviations: Step-through pattern;Decreased stride length;Antalgic;Trunk flexed   Gait velocity interpretation: Below normal speed for age/gender General Gait Details: Trunk flexed which causes him too much pain to stand upright during ambulation.  Heavy reliance on RW for support and pt requests to sit  down after amb 10 ft 2/2 Lt knee pain (pt reports Lt knee arthritis).   Stairs            Wheelchair Mobility    Modified Rankin (Stroke Patients Only)       Balance Overall balance assessment: Needs assistance Sitting-balance support: No upper extremity supported;Feet supported Sitting balance-Leahy Scale: Good     Standing balance support: Bilateral upper extremity supported;During functional activity Standing balance-Leahy Scale: Poor Standing balance comment: Requires RW for support                     Cognition Arousal/Alertness: Awake/alert Behavior During Therapy: WFL for tasks assessed/performed Overall Cognitive Status: Within Functional Limits for tasks assessed                      Exercises      General Comments        Pertinent Vitals/Pain Pain Assessment: 0-10 Pain Score: 4  Pain Location: abdomen, Lt knee up to hip 2/2 arthritis Pain Descriptors / Indicators: Aching;Discomfort;Grimacing;Guarding Pain Intervention(s): Limited activity within patient's tolerance;Monitored during session;Repositioned    Home Living                      Prior Function            PT Goals (current goals can now be found in the care plan section) Acute Rehab PT Goals Patient Stated Goal: go home from hospital PT Goal Formulation: With patient Time For Goal Achievement: 07/05/15 Potential to Achieve Goals: Good Progress towards PT goals: Progressing toward goals  Frequency  Min 3X/week    PT Plan Current plan remains appropriate    Co-evaluation             End of Session Equipment Utilized During Treatment: Gait belt Activity Tolerance: Patient limited by pain (increased knee pain and abdominal pain with standing. ) Patient left: in chair;with family/visitor present;with call bell/phone within reach     Time: 2229-7989 PT Time Calculation (min) (ACUTE ONLY): 15 min  Charges:  $Gait Training: 8-22 mins                     G Codes:      William [name] PT, [place] [phone] Pager: [phone] 06/22/2015, 10:57 AM

## 2015-06-23 NOTE — Progress Notes (Signed)
Physical Therapy Treatment Patient Details Name: William [name] MRN: [id number] DOB: [birth year] Today's Date: 06/23/2015    History of Present Illness Pt is a 64 y/o M s/p colostomy takedown.  Pt's PMH includes Lt knee arthritis (per pt), HTN, hypothyroidism, COPD, depression, and anxiety.    PT Comments    William [name] made excellent progress w/ therapy this session and ambulated 100 ft in hall.  Pt remains limited by Lt knee and abdominal pain.  Patient needs to practice stairs next session.  Pt will benefit from continued skilled PT services to increase functional independence and safety.   Follow Up Recommendations  Home health PT;Supervision for mobility/OOB     Equipment Recommendations  Other (comment) (reports having all equipment needs met already)    Recommendations for Other Services       Precautions / Restrictions Precautions Precautions: Fall Restrictions Weight Bearing Restrictions: No    Mobility  Bed Mobility Overal bed mobility: Modified Independent             General bed mobility comments: Verbal cues provided for log roll, HOB elevated and use of bed rails  Transfers Overall transfer level: Needs assistance Equipment used: Rolling walker (2 wheeled) Transfers: Sit to/from Stand Sit to Stand: +2 safety/equipment;Min guard         General transfer comment: Min guard for safety, min cues for hand positioning.  Slow to stand as trunk flexion causes sharp pain in abdomen.  Ambulation/Gait Ambulation/Gait assistance: Min guard;+2 safety/equipment Ambulation Distance (Feet): 100 Feet Assistive device: Rolling walker (2 wheeled) Gait Pattern/deviations: Step-through pattern;Decreased stride length;Shuffle;Antalgic;Trunk flexed   Gait velocity interpretation: Below normal speed for age/gender General Gait Details: Trunk flexed which causes him too much pain to stand upright during ambulation.  Tendency to shuffle Rt LE 2/2 Lt knee pain but is able  to improve w/ verbal cues for technique.  4 standing rest breaks 2/2 generalized fatigue and cramping in Lt quad.   Stairs Stairs:  (deferred to next session 2/2 pt's fatigue w/ ambulation)          Information systems manager mobility: Yes Wheelchair propulsion: Both upper extremities Wheelchair parts: Independent Distance: 10 Wheelchair Assistance Details (indicate cue type and reason): Pt says he is more comfortable sitting in WC and maneuvers in room Ind at end of session.  Modified Rankin (Stroke Patients Only)       Balance Overall balance assessment: Needs assistance Sitting-balance support: Feet supported;Bilateral upper extremity supported Sitting balance-Leahy Scale: Good     Standing balance support: Bilateral upper extremity supported;During functional activity Standing balance-Leahy Scale: Poor Standing balance comment: Relies on RW for support                    Cognition Arousal/Alertness: Awake/alert Behavior During Therapy: WFL for tasks assessed/performed Overall Cognitive Status: Within Functional Limits for tasks assessed                      Exercises Total Joint Exercises Knee Flexion: AROM;Left;10 reps;Standing General Exercises - Lower Extremity Ankle Circles/Pumps: AROM;Both;10 reps;Supine Quad Sets: Strengthening;Both;5 reps;Seated Gluteal Sets: Strengthening;Both;5 reps;Supine    General Comments General comments (skin integrity, edema, etc.): Pt provided w/ ice pack at end of session for Lt knee and educated to apply only for 20 minutes at a time, pt verbalized understanding.      Pertinent Vitals/Pain Pain Assessment: 0-10 Pain Score: 5  Pain Location: Lt knee and abdomen Pain Descriptors / Indicators: Aching;Cramping;Hervey Ard (  Lt knee cramping; sharp pain in abdomen w/ trunk extension) Pain Intervention(s): Limited activity within patient's tolerance;Monitored during session    Home Living                       Prior Function            PT Goals (current goals can now be found in the care plan section) Acute Rehab PT Goals Patient Stated Goal: go home from hospital PT Goal Formulation: With patient Time For Goal Achievement: 07/05/15 Potential to Achieve Goals: Good Progress towards PT goals: Progressing toward goals    Frequency  Min 3X/week    PT Plan Current plan remains appropriate    Co-evaluation             End of Session   Activity Tolerance: Patient limited by pain;Patient limited by fatigue Patient left: in chair;with family/visitor present;with call bell/phone within reach     Time: 1011-1036 PT Time Calculation (min) (ACUTE ONLY): 25 min  Charges:  $Gait Training: 8-22 mins $Therapeutic Exercise: 8-22 mins                    G Codes:      Joslyn Hy PT, [place] [phone] Pager: [phone] 06/23/2015, 12:15 PM

## 2015-06-23 NOTE — Consult Note (Signed)
WOC wound follow up Wound type: surgical midline with colostomy take down site LLQ Measurement:  Midline 18cm x 2.5cm x 2.0cm; LLQ wound 2.5cm x 5.0cm x 2.0cm Wound bed: both areas are clean, pink, subcutaneous tissue.  Noted areas where silver nitrate used at the lateral right edge of the wound.  Drainage (amount, consistency, odor) serosanguinous in canister  Periwound: intact, one small ruptured bulla from tape in the RUQ Dressing procedure/placement/frequency: Skin protected along distal edge of the wound due to the thinness of this area, skin protected between the ostomy takedown site and the midline wound.  1pc of black foam used to fill takedown site, bridge of black foam used between sites, 1pc of black foam used to fill the midline wound.  Draped area and sealed at .    Pt received IV pain meds prior to dressing change, tolerated well.  Area where wound was cauterized is the most painful for the patient.  WOC will follow along with you for NPWT VAC dressing changes M/W/F.    Raynah Gomes [place], [place] [phone]

## 2015-06-23 NOTE — Progress Notes (Signed)
Patient ID: William [name], male   DOB: 1951/05/07, 64 y.o.   MRN: [id number] [facility] Progress Note:   4 Days Post-Op  Subjective: Mental status is clear.  Taking liquid breakfast;   Objective: Vital signs in last 24 hours: Temp:  [98.7 F (37.1 C)-99.6 F (37.6 C)] 98.7 F (37.1 C) (09/05 0402) Pulse Rate:  [94-100] 100 (09/05 0402) Resp:  [19-20] 19 (09/05 0402) BP: (126-130)/(69-76) 129/76 mmHg (09/05 0402) SpO2:  [95 %-98 %] 96 % (09/05 0402)  Intake/Output from previous day: 09/04 0701 - 09/05 0700 In: 1991.7 [P.O.:720; I.V.:1271.7] Out: 1186 [Urine:1175; Drains:10; Stool:1] Intake/Output this shift: Total I/O In: -  Out: 5 [Drains:5]  Physical Exam: Work of breathing is normal.  No nausea and taking clear liquids well.    Lab Results:  No results found for this or any previous visit (from the past 48 hour(s)).  Radiology/Results: No results found.  Anti-infectives: Anti-infectives    Start     Dose/Rate Route Frequency Ordered Stop   06/19/15 0700  cefoTEtan (CEFOTAN) 2 g in dextrose 5 % 50 mL IVPB     2 g 100 mL/hr over 30 Minutes Intravenous To Surgery 06/18/15 1247 06/19/15 0742   06/19/15 0611  cefoTEtan in Dextrose 5% (CEFOTAN) 2-2.08 GM-% IVPB    Comments:  Domenica Fail   : cabinet override      06/19/15 4098 06/19/15 1814      Assessment/Plan: Problem List: Patient Active Problem List   Diagnosis Date Noted  . S/P colostomy takedown 06/19/2015  . S/P colostomy 11/21/2014  . Hypothyroidism 11/20/2014  . Essential hypertension 11/20/2014  . Acute on chronic respiratory failure 11/19/2014  . Bacteroides fragilis infection 11/12/2014  . Normocytic anemia 11/12/2014  . Solitary pulmonary nodule on lung CT 10/28/2014  . Obstructive sleep apnea 10/28/2014  . Diverticulitis of colon with perforation s/p colectomy/colostomy 10/28/2014 10/27/2014    Will advance diet.   4 Days Post-Op    LOS: 4 days   Matt B. Daphine Deutscher, MD,  [facility] [phone] beeper [phone]  06/23/2015 8:29 AM

## 2015-06-24 NOTE — Addendum Note (Signed)
Addendum  created 06/24/15 1508 by Lewie Loron, MD   Modules edited: Anesthesia Responsible Staff

## 2015-06-24 NOTE — Care Management (Signed)
[facility] wound VAC has been approved . VAC will be delivered to hospital home today . Ronny Flurry RN BSN [phone]

## 2015-06-24 NOTE — Progress Notes (Signed)
5 Days Post-Op  Subjective: Feeling better. Has only had liquids  Objective: Vital signs in last 24 hours: Temp:  [98.4 F (36.9 C)-99.9 F (37.7 C)] 98.4 F (36.9 C) (09/06 0636) Pulse Rate:  [75-108] 75 (09/06 0636) Resp:  [18-19] 19 (09/06 0636) BP: (108-142)/(71-85) 112/71 mmHg (09/06 0636) SpO2:  [95 %-96 %] 96 % (09/06 0636) Last BM Date: 06/23/15  Intake/Output from previous day: 09/05 0701 - 09/06 0700 In: 1540 [P.O.:1540] Out: 2940 [Urine:2925; Drains:15] Intake/Output this shift:    Resp: clear to auscultation bilaterally Cardio: regular rate and rhythm GI: soft, nontender. vac intact  Lab Results:  No results for input(s): WBC, HGB, HCT, PLT in the last 72 hours. BMET No results for input(s): NA, K, CL, CO2, GLUCOSE, BUN, CREATININE, CALCIUM in the last 72 hours. PT/INR No results for input(s): LABPROT, INR in the last 72 hours. ABG No results for input(s): PHART, HCO3 in the last 72 hours.  Invalid input(s): PCO2, PO2  Studies/Results: No results found.  Anti-infectives: Anti-infectives    Start     Dose/Rate Route Frequency Ordered Stop   06/19/15 0700  cefoTEtan (CEFOTAN) 2 g in dextrose 5 % 50 mL IVPB     2 g 100 mL/hr over 30 Minutes Intravenous To Surgery 06/18/15 1247 06/19/15 0742   06/19/15 0611  cefoTEtan in Dextrose 5% (CEFOTAN) 2-2.08 GM-% IVPB    Comments:  Domenica Fail   : cabinet override      06/19/15 1610 06/19/15 1814      Assessment/Plan: s/p Procedure(s): COLOSTOMY TAKEDOWN (N/A) Advance diet  Plan for discharge tomorrow  LOS: 5 days    TOTH III,PAUL S 06/24/2015

## 2015-06-24 NOTE — Care Management Note (Addendum)
Case Management Note  Patient Details  Name: William [name] MRN: [id number] Date of Birth: 24-Feb-1951  Subjective/Objective:                    Action/Plan:  Patient requesting shower stool, same ordered . PT recommending home health patient declining home health PT at present. Patient agreeable to home health RN and home wound VAC . Home VAC in patient's room. Expected Discharge Date:                  Expected Discharge Plan:  Home w Home Health Services  In-House Referral:     Discharge planning Services  CM Consult  Post Acute Care Choice:  Home Health Choice offered to:  Patient  DME Arranged:  Vac, Shower stool DME Agency:  [facility], [facility]  HH Arranged:  RN [facility] Agency:  [facility]  Status of Service:  Completed, signed off     Medicare Important Message Given:    Date Medicare IM Given:    Medicare IM give by:    Date Additional Medicare IM Given:    Additional Medicare Important Message give by:     If discussed at Long Length of Stay Meetings, dates discussed:  06-24-15  Additional Comments:  Kingsley Plan, RN 06/24/2015, 2:33 PM

## 2015-06-24 NOTE — Progress Notes (Addendum)
Physical Therapy Treatment Patient Details Name: William [name] MRN: [id number] DOB: 19-Feb-1951 Today's Date: 06/24/2015    History of Present Illness Pt is a 64 y/o M s/p colostomy takedown.  Pt's PMH includes Lt knee arthritis (per pt), HTN, hypothyroidism, COPD, depression, and anxiety.    PT Comments    William [name] made excellent progress this session and demonstrated ability to ascend/descend 6 steps w/ supervision and assist managing wound vac.  Mild instability still present ambulating w/ RW.  Pt will benefit from continued skilled PT services to increase functional independence and safety.   Follow Up Recommendations  Home health PT;Supervision for mobility/OOB     Equipment Recommendations  Other (comment) (reports having all equipment needs met already)    Recommendations for Other Services       Precautions / Restrictions Precautions Precautions: Fall Restrictions Weight Bearing Restrictions: No    Mobility  Bed Mobility               General bed mobility comments: Pt sitting in WC upon PT arrival  Transfers Overall transfer level: Modified independent Equipment used: Rolling walker (2 wheeled) Transfers: Sit to/from Stand Sit to Stand: Modified independent (Device/Increase time)         General transfer comment: Mod I w/ no verbal cues or physical assist needed.  Ambulation/Gait Ambulation/Gait assistance: Supervision Ambulation Distance (Feet): 150 Feet Assistive device: Rolling walker (2 wheeled) Gait Pattern/deviations: Step-through pattern;Decreased stride length;Antalgic;Trunk flexed   Gait velocity interpretation: Below normal speed for age/gender General Gait Details: Trunk flexed but posture much improved.  2 standing rest breaks 2/2 generalized fatigue.   Stairs Stairs: Yes Stairs assistance: Supervision Stair Management: One rail Left;Step to pattern;Sideways Number of Stairs: 6 General stair comments: Cues for technique  sideways.  Assist w/ managing wound vac.   Wheelchair Mobility    Modified Rankin (Stroke Patients Only)       Balance Overall balance assessment: Needs assistance Sitting-balance support: No upper extremity supported;Feet supported Sitting balance-Leahy Scale: Good     Standing balance support: Bilateral upper extremity supported;During functional activity Standing balance-Leahy Scale: Fair                      Cognition Arousal/Alertness: Awake/alert Behavior During Therapy: WFL for tasks assessed/performed Overall Cognitive Status: Within Functional Limits for tasks assessed                      Exercises      General Comments        Pertinent Vitals/Pain Pain Assessment: Faces Faces Pain Scale: Hurts a little bit Pain Location: Lt and Rt knees Pain Descriptors / Indicators: Aching Pain Intervention(s): Limited activity within patient's tolerance;Monitored during session;Repositioned    Home Living                      Prior Function            PT Goals (current goals can now be found in the care plan section) Acute Rehab PT Goals Patient Stated Goal: go home from hospital PT Goal Formulation: With patient Time For Goal Achievement: 07/05/15 Potential to Achieve Goals: Good Progress towards PT goals: Progressing toward goals    Frequency  Min 4X/week    PT Plan Current plan remains appropriate    Co-evaluation             End of Session   Activity Tolerance: Patient tolerated treatment well Patient left: in  chair;with family/visitor present;with call bell/phone within reach     Time: 5456-2563 PT Time Calculation (min) (ACUTE ONLY): 15 min  Charges:  $Gait Training: 8-22 mins                    G Codes:      Joslyn Hy PT, [place] [phone] Pager: [phone] 06/24/2015, 3:47 PM

## 2015-06-25 MED ORDER — OXYCODONE-ACETAMINOPHEN 5-325 MG PO TABS: 1.0000 | ORAL_TABLET | ORAL | Status: DC | PRN

## 2015-06-25 MED ORDER — PANTOPRAZOLE SODIUM 40 MG PO TBEC: 40.0000 mg | DELAYED_RELEASE_TABLET | Freq: Every day | ORAL | Status: DC

## 2015-06-25 NOTE — Discharge Summary (Signed)
Physician Discharge Summary  Patient ID: Hasan Douse MRN: [id number] DOB/AGE: 26-Aug-1951 64 y.o.  Admit date: 06/19/2015 Discharge date: 06/25/2015  Admission Diagnoses:  Discharge Diagnoses:  Active Problems:   S/P colostomy takedown   Discharged Condition: good  Hospital Course: the pt underwent colostomy takedown. He tolerated the surgery well. On pod 1 he had some bleeding from the skin edge which was controlled with silver nitrate. He gradually improved and is now ready for discharge home. He will need his vac dressing changed by home health every M, W, and F.  Consults: wound care  Significant Diagnostic Studies: none  Treatments: surgery: as above  Discharge Exam: Blood pressure 116/67, pulse 82, temperature 100.4 F (38 C), temperature source Oral, resp. rate 20, height  (1.803 m), weight 132.45 kg (292 lb), SpO2 95 %. Resp: clear to auscultation bilaterally Cardio: regular rate and rhythm GI: soft, nontender  Disposition: 01-Home or Self Care  Discharge Instructions    Call MD for:  difficulty breathing, headache or visual disturbances    Complete by:  As directed      Call MD for:  extreme fatigue    Complete by:  As directed      Call MD for:  hives    Complete by:  As directed      Call MD for:  persistant dizziness or light-headedness    Complete by:  As directed      Call MD for:  persistant nausea and vomiting    Complete by:  As directed      Call MD for:  redness, tenderness, or signs of infection (pain, swelling, redness, odor or green/yellow discharge around incision site)    Complete by:  As directed      Call MD for:  severe uncontrolled pain    Complete by:  As directed      Call MD for:  temperature >100.4    Complete by:  As directed      Diet - low sodium heart healthy    Complete by:  As directed      Discharge instructions    Complete by:  As directed   May shower. Diet as tolerated. No heavy lifting. Home health to change vac every  Monday, Wednesday, and friday     Increase activity slowly    Complete by:  As directed             Medication List    TAKE these medications        cholecalciferol 1000 UNITS tablet  Commonly known as:  VITAMIN D  Take 1,000 Units by mouth daily.     diclofenac sodium 1 % Gel  Commonly known as:  VOLTAREN  Apply 4 g topically 3 (three) times daily.     furosemide 20 MG tablet  Commonly known as:  LASIX  Take 1 tablet (20 mg total) by mouth daily.     levothyroxine 175 MCG tablet  Commonly known as:  SYNTHROID, LEVOTHROID  Take 175 mcg by mouth daily before breakfast.     meloxicam 15 MG tablet  Commonly known as:  MOBIC  Take 15 mg by mouth daily.     metoprolol tartrate 25 MG tablet  Commonly known as:  LOPRESSOR  Take 1 tablet (25 mg total) by mouth 2 (two) times daily.     MULTIVITAMIN PO  Take 1 tablet by mouth daily.     oxyCODONE-acetaminophen 5-325 MG per tablet  Commonly known as:  ROXICET  Take 1-2 tablets by mouth every 4 (four) hours as needed.     pantoprazole 40 MG tablet  Commonly known as:  PROTONIX  Take 1 tablet (40 mg total) by mouth daily.     saccharomyces boulardii 250 MG capsule  Commonly known as:  FLORASTOR  Take 1 capsule (250 mg total) by mouth 2 (two) times daily.     VENTOLIN HFA 108 (90 BASE) MCG/ACT inhaler  Generic drug:  albuterol  Inhale 1 puff into the lungs every 4 (four) hours as needed for wheezing.     vitamin E 400 UNIT capsule  Take 400 Units by mouth 2 (two) times daily.           Follow-up Information    Follow up with Robyne Askew, MD In 2 weeks.   Specialty:  General Surgery   Contact information:   [address] [place] [address] [phone]       Signed: Robyne Askew 06/25/2015, 7:02 AM

## 2015-06-25 NOTE — Progress Notes (Signed)
Discharge instructions gone over with patient and family. Home medications gone over. Prescription given. Follow up appointment to be made. Home vac was placed by [facility] nurse. [facility] following patient. Diet , activity, and reasons to call the doctor gone over. Signs and symptoms of infection or worsening condition gone over. Patient verbalized understanding of instructions. Home vac supplies were sent home with patient also. Patient is to pick up shower seat at the medical supply. Patient has my chart.

## 2015-06-25 NOTE — Progress Notes (Signed)
6 Days Post-Op  Subjective: He feels pretty good. Says he is ready to go home  Objective: Vital signs in last 24 hours: Temp:  [98.7 F (37.1 C)-100.4 F (38 C)] 100.4 F (38 C) (09/07 0529) Pulse Rate:  [82-103] 82 (09/07 0529) Resp:  [18-20] 20 (09/07 0529) BP: (116-133)/(64-74) 116/67 mmHg (09/07 0529) SpO2:  [95 %-98 %] 95 % (09/07 0529) Last BM Date: 06/24/15  Intake/Output from previous day: 09/06 0701 - 09/07 0700 In: 220 [P.O.:220] Out: 400 [Urine:400] Intake/Output this shift:    Resp: clear to auscultation bilaterally Cardio: regular rate and rhythm GI: soft, nontender. vac intact. having bm'[name]  Lab Results:  No results for input([name]): WBC, HGB, HCT, PLT in the last 72 hours. BMET No results for input([name]): NA, K, CL, CO2, GLUCOSE, BUN, CREATININE, CALCIUM in the last 72 hours. PT/INR No results for input([name]): LABPROT, INR in the last 72 hours. ABG No results for input([name]): PHART, HCO3 in the last 72 hours.  Invalid input([name]): PCO2, PO2  Studies/Results: No results found.  Anti-infectives: Anti-infectives    Start     Dose/Rate Route Frequency Ordered Stop   06/19/15 0700  cefoTEtan (CEFOTAN) 2 g in dextrose 5 % 50 mL IVPB     2 g 100 mL/hr over 30 Minutes Intravenous To Surgery 06/18/15 1247 06/19/15 0742   06/19/15 0611  cefoTEtan in Dextrose 5% (CEFOTAN) 2-2.08 GM-% IVPB    Comments:  William [name]   : cabinet override      06/19/15 1610 06/19/15 1814      Assessment/Plan: [name]/p Procedure([name]): COLOSTOMY TAKEDOWN (N/A) Discharge  LOS: 6 days    TOTH III,William [name] 06/25/2015

## 2015-06-26 ENCOUNTER — Encounter (HOSPITAL_COMMUNITY): Payer: Self-pay | Admitting: Vascular Surgery

## 2015-06-26 DIAGNOSIS — Z9289 Personal history of other medical treatment: Secondary | ICD-10-CM | POA: Insufficient documentation

## 2016-05-29 IMAGING — CR DG CHEST 1V PORT
1 series · 1 of 1 positions shown · non-contrast
Comparison: None.

CLINICAL DATA: Mid abdominal pain.

EXAM:
PORTABLE CHEST - 1 VIEW

[view not recorded]
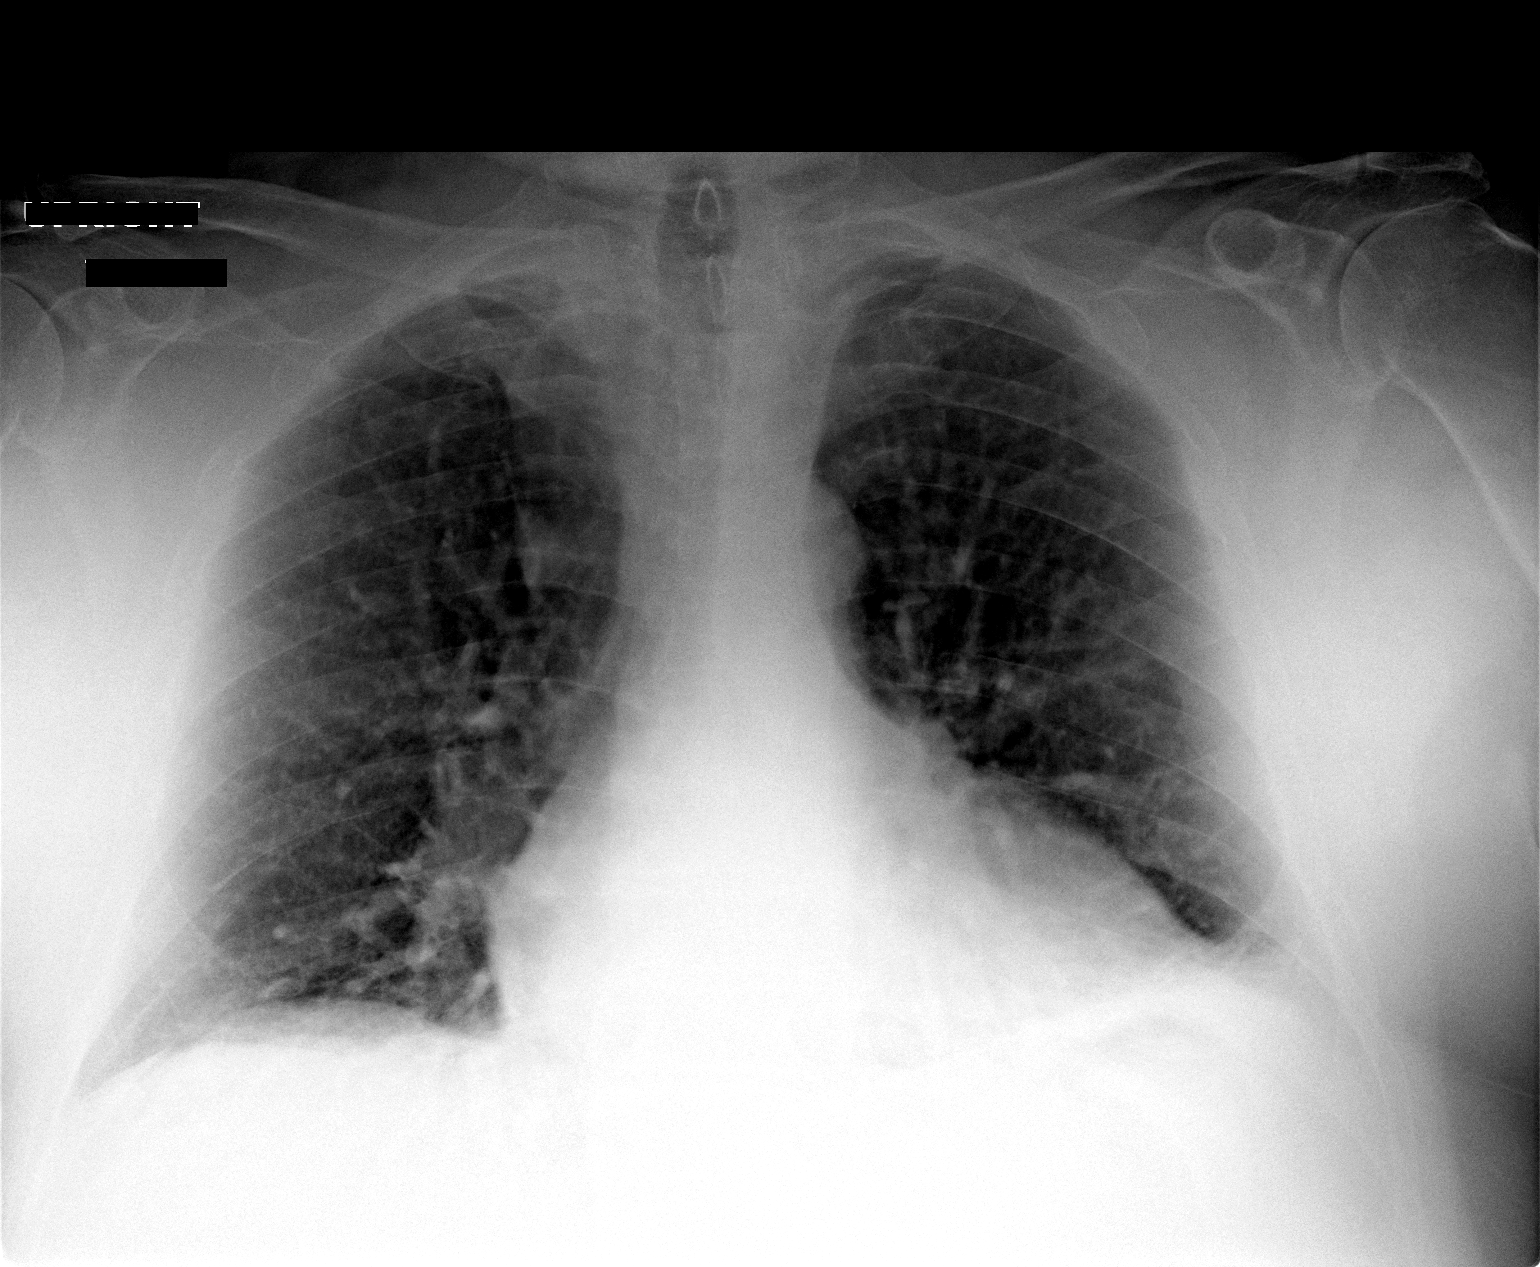

[1 of 1 positions shown; findings below may reference images not displayed]

FINDINGS: Mild enlargement of the cardiopericardial silhouette with
cephalization of blood flow. No definite Kerley B-lines. Indistinct
airspace opacity along the left hemidiaphragm. Mild blunting of the
left lateral costophrenic angle.
IMPRESSION: 1. Mild enlargement of the cardiopericardial silhouette with
cephalization of blood flow favoring pulmonary venous hypertension.
2. Suspected trace left pleural effusion. Suspected atelectasis at
the left lung base.

## 2016-05-30 IMAGING — CR DG CHEST 1V PORT
1 series · 2 of 2 positions shown · non-contrast
Comparison: Chest radiograph performed 10/27/2014

CLINICAL DATA: Sepsis.  Initial encounter.

EXAM:
PORTABLE CHEST - 1 VIEW

[Series 1: AP · U · 2 of 2 slices shown]
[im 1/2]
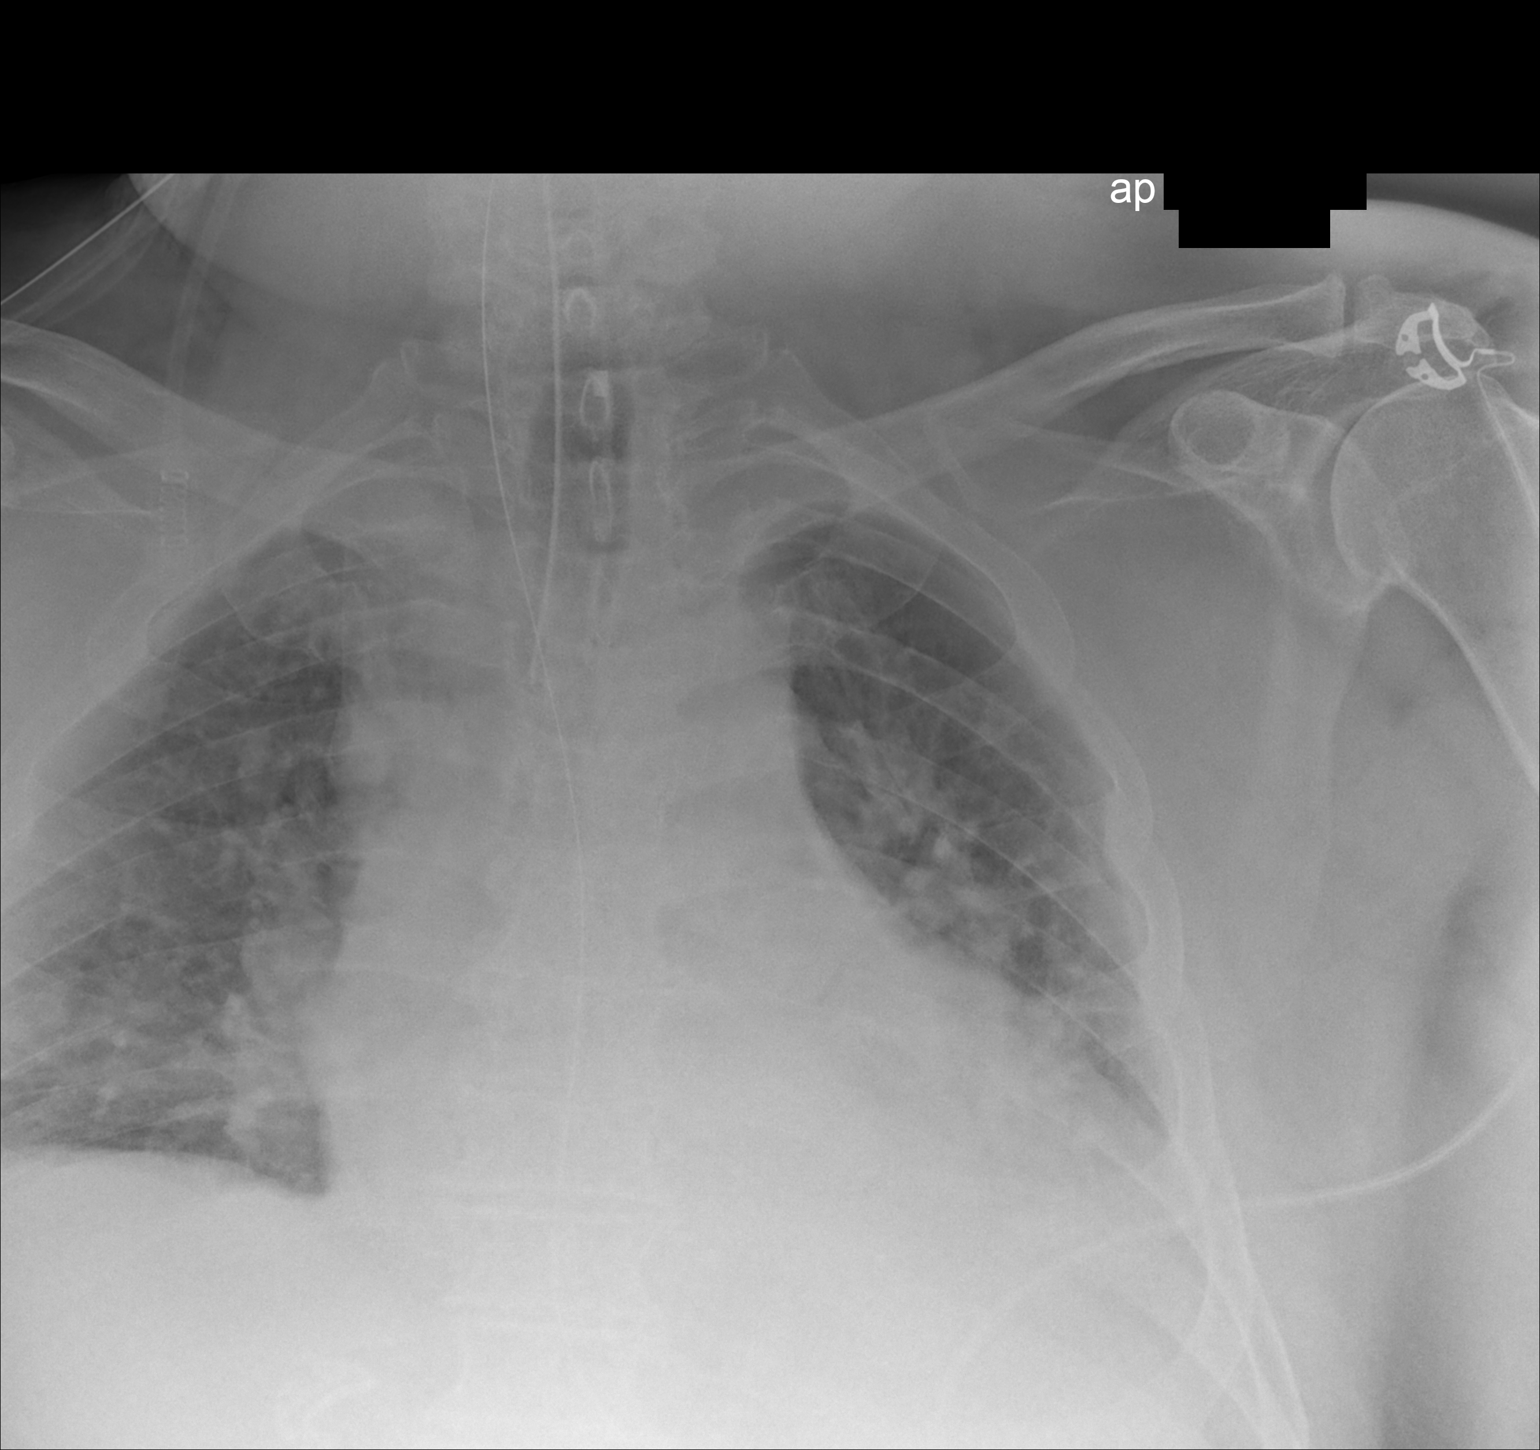
[im 2/2]
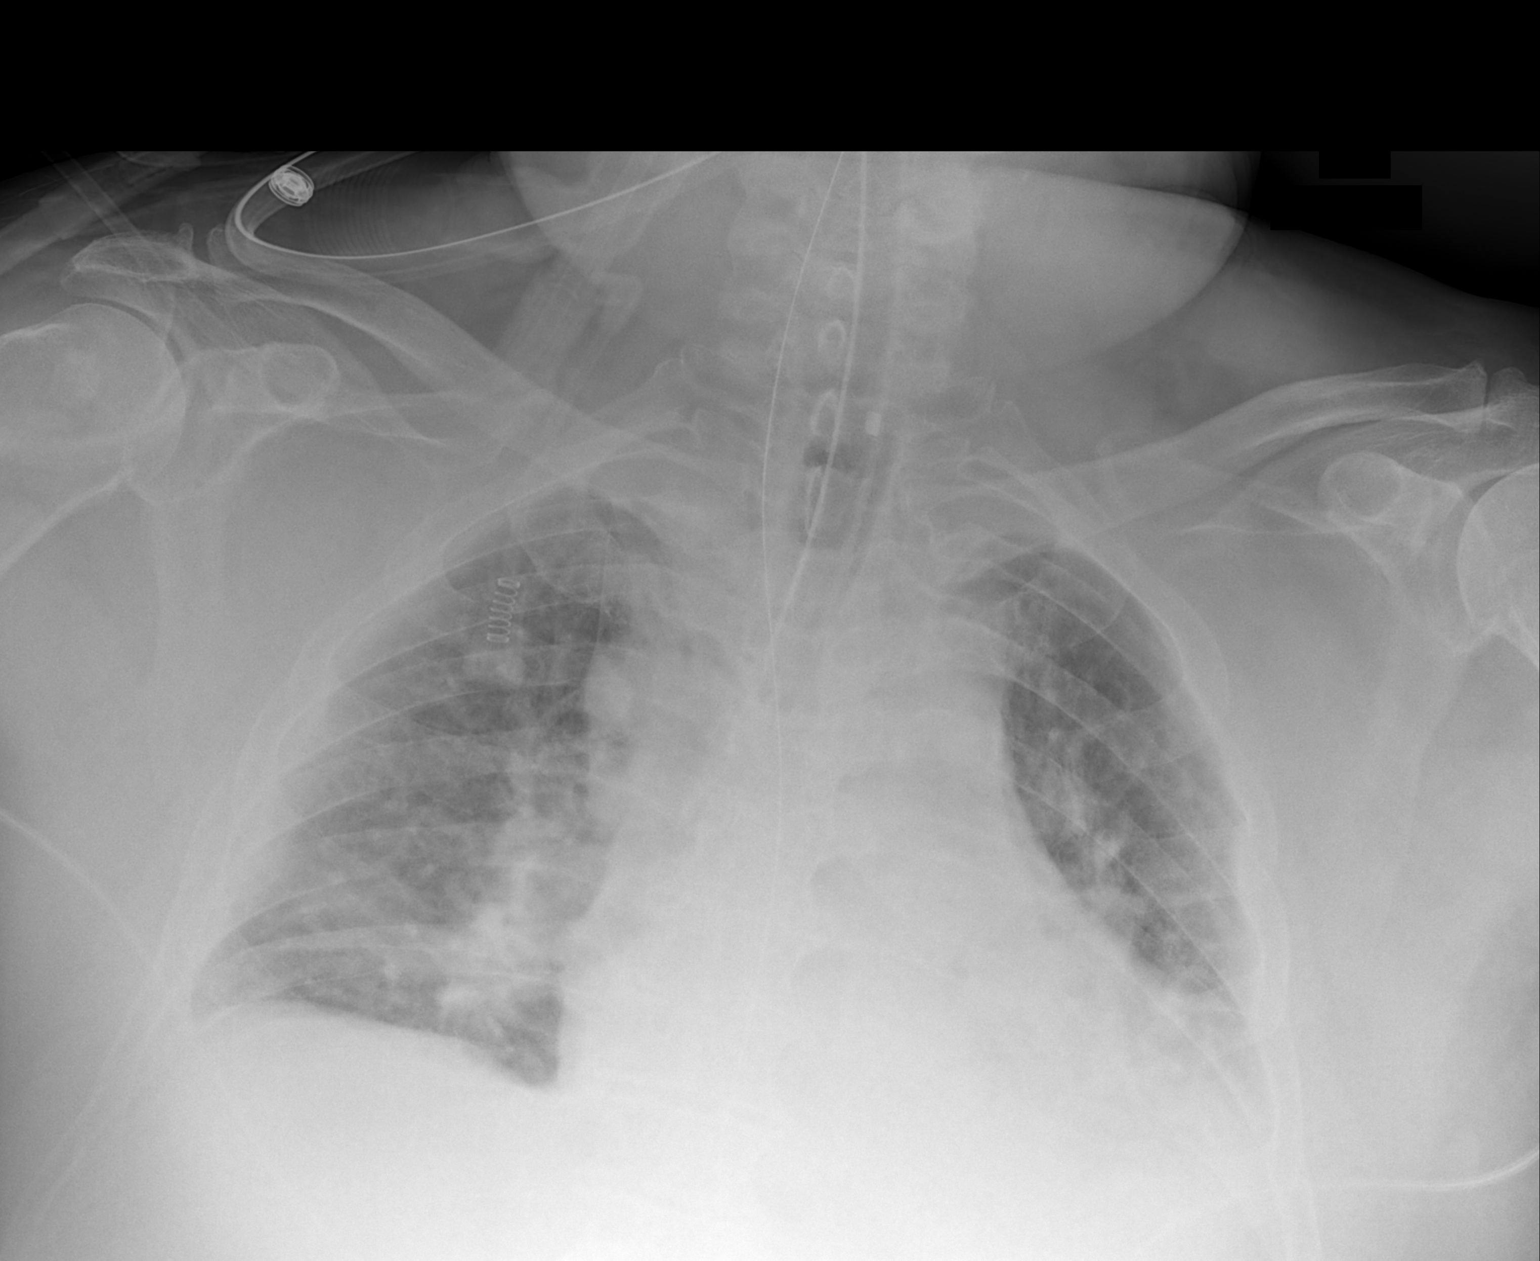

[2 of 2 positions shown; findings below may reference images not displayed]

FINDINGS: The patient's endotracheal tube is seen ending 3 cm above the
carina. An enteric tube is noted extending below the diaphragm.

The lungs are hypoexpanded. Vascular crowding and vascular
congestion are seen. Bilateral central airspace opacities raise
concern for pulmonary edema, though pneumonia could have a similar
appearance. A small left pleural effusion is suspected. No
pneumothorax is seen.

The cardiomediastinal silhouette is borderline normal in size. No
acute osseous abnormalities are identified.
IMPRESSION: 1. Endotracheal tube seen ending 3 cm above the carina.
2. Lungs hypoexpanded. Vascular congestion noted. Bilateral central
airspace opacities raise concern for pulmonary edema, though
pneumonia could have a similar appearance. Small left pleural
effusion suspected.

## 2016-06-03 IMAGING — DX DG CHEST 1V PORT
1 series · 1 of 1 positions shown · non-contrast
Comparison: 10/31/2014

CLINICAL DATA: Subsequent encounter for respiratory failure.
Endotracheal tube placement.

EXAM:
PORTABLE CHEST - 1 VIEW

[chest ap]
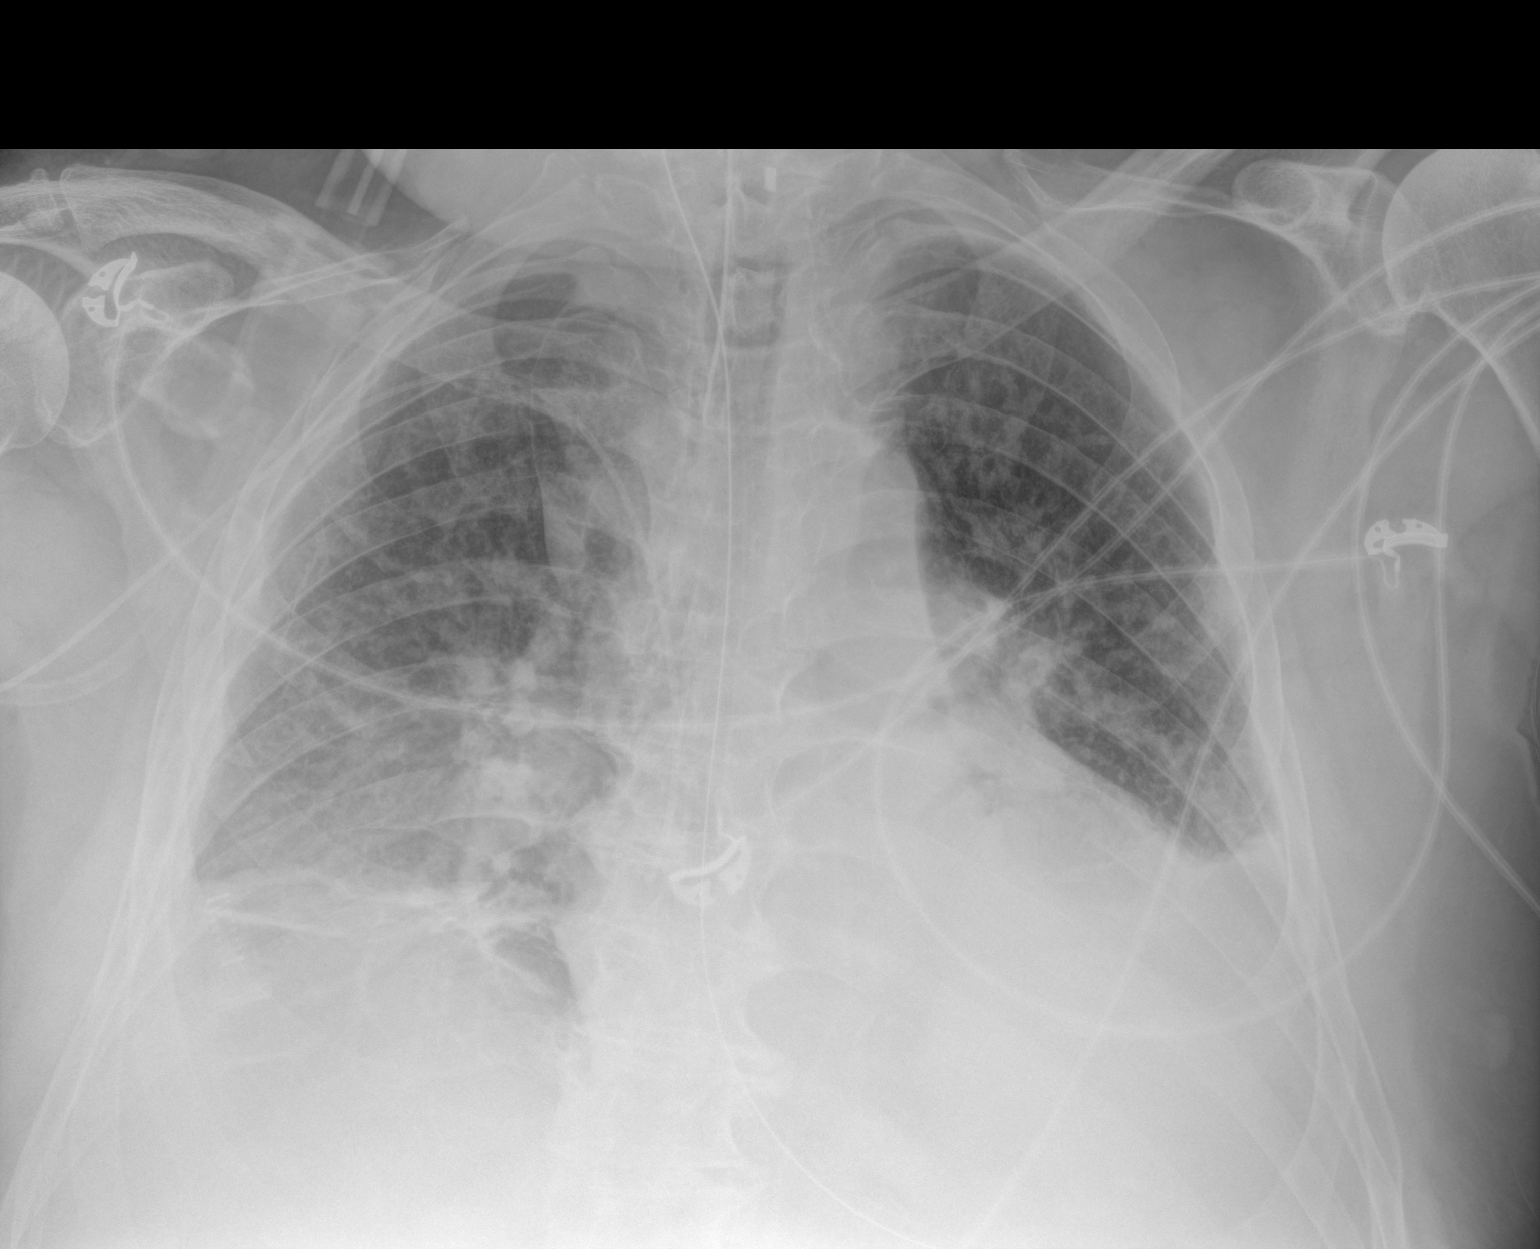

[1 of 1 positions shown; findings below may reference images not displayed]

FINDINGS: 0650 hrs. Endotracheal tube tip is 6.4 cm above the base of the
carina. The NG tube passes into the stomach although the distal tip
position is not included on the film. Right PICC line tip overlies
the distal SVC, near the SVC/RA junction. Lung volumes are low with
bibasilar collapse/ consolidation and small bilateral pleural
effusions. Pulmonary vascular congestion again noted without overt
airspace pulmonary edema. The cardio pericardial silhouette is
enlarged.
IMPRESSION: Cardiomegaly with bibasilar collapse/ consolidation and small
effusions.

Stable position of support apparatus.

## 2016-06-04 IMAGING — DX DG CHEST 1V PORT
1 series · 1 of 1 positions shown · non-contrast
Comparison: Chest radiograph performed 11/01/2014

CLINICAL DATA: Endotracheal tube placement.  Initial encounter.

EXAM:
PORTABLE CHEST - 1 VIEW

[chest ap]
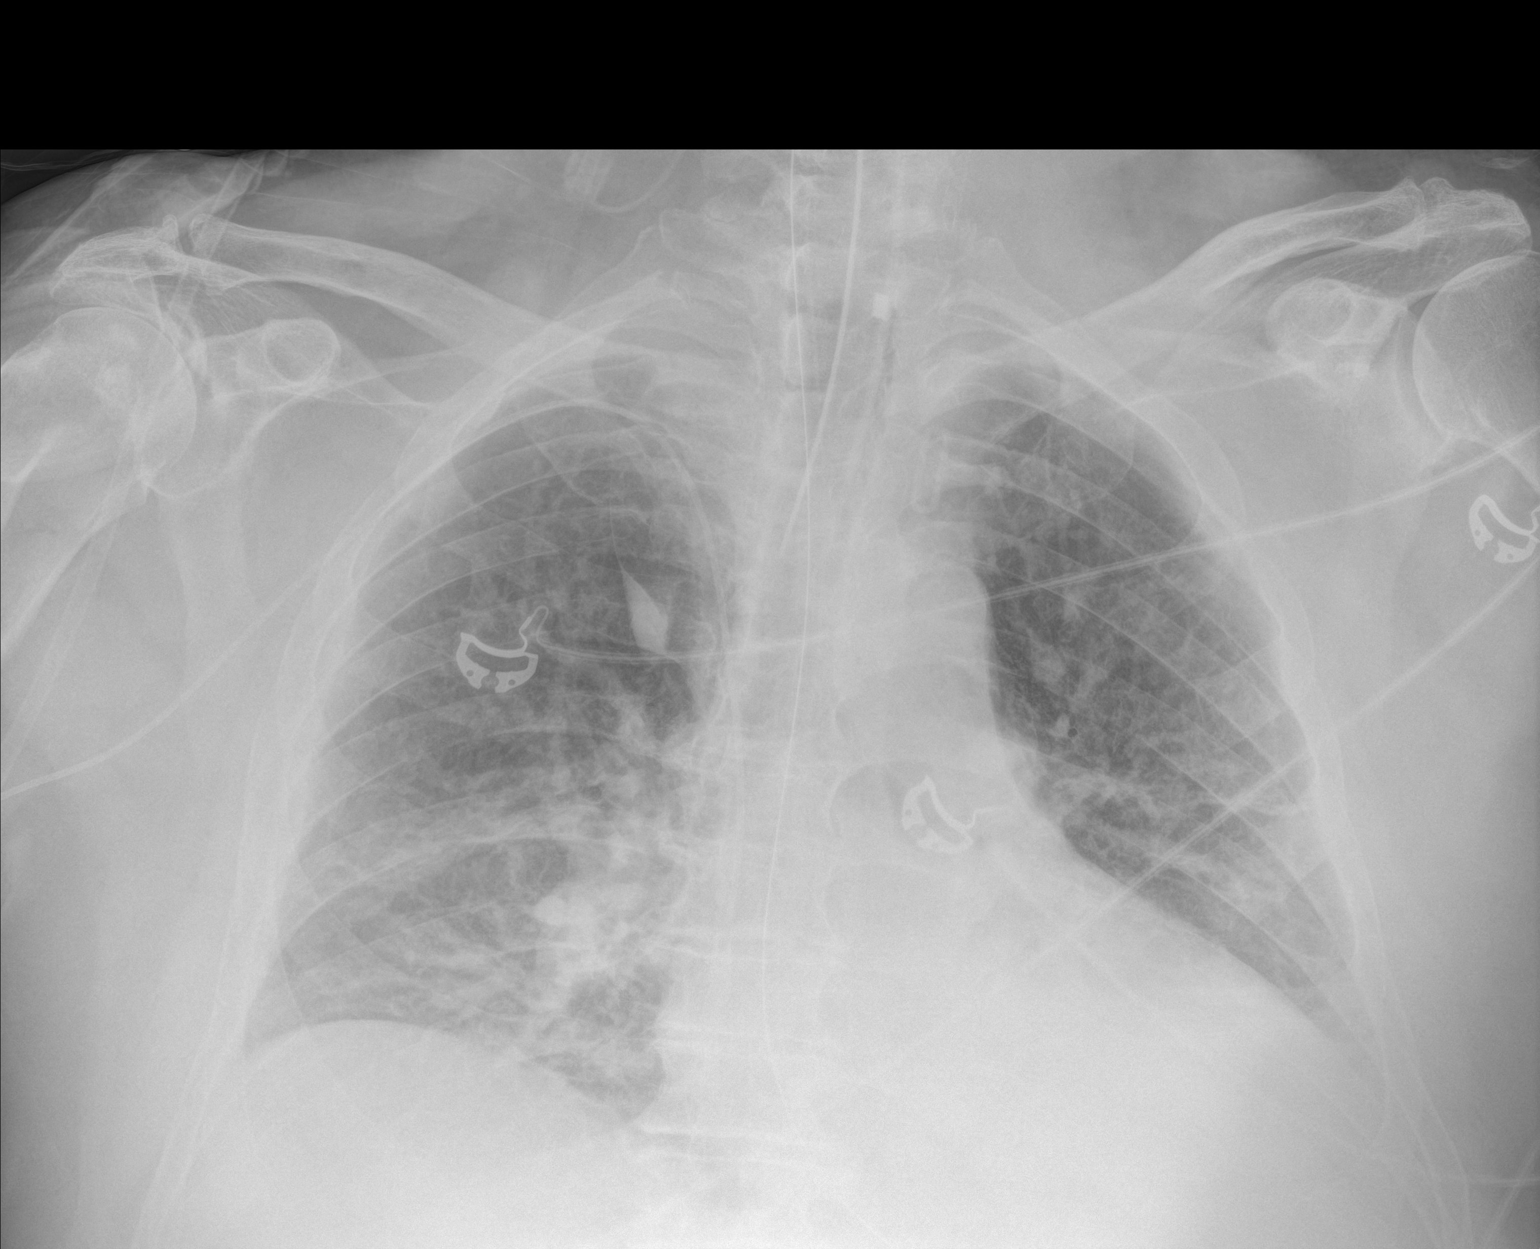

[1 of 1 positions shown; findings below may reference images not displayed]

FINDINGS: The patient's endotracheal tube is seen ending 4 cm above the
carina. An enteric tube is noted extending below the diaphragm. A
right PICC is noted ending about the cavoatrial junction.

Patchy bilateral airspace opacities may reflect mild pulmonary edema
or pneumonia. Vascular congestion is noted. An accessory azygos lobe
is again seen. The left lung base is not well characterized. A small
left pleural effusion is suspected. No pneumothorax is seen.

The cardiomediastinal silhouette is borderline normal in size. No
acute osseous abnormalities are seen.
IMPRESSION: 1. Endotracheal tube seen ending 4 cm above the carina.
2. Patchy bilateral airspace opacities may reflect mild pulmonary
edema or pneumonia. Vascular congestion noted. Small left pleural
effusion suspected.

## 2016-06-05 IMAGING — CT CT HEAD W/O CM
1 of 2 series · 16 of 30 positions shown, 20 images · non-contrast
Comparison: None

CLINICAL DATA: Agitated on ventilator.  Altered mental status.

EXAM:
CT HEAD WITHOUT CONTRAST
TECHNIQUE: Contiguous axial images were obtained from the base of the skull
through the vertex without contrast.

[Series 2: headseq 4.8 h45s · axial · 0.45mm/px · z∈[-150,+2]mm · 16 of 36 slices shown, 20 images]
[im 2/36  brain]
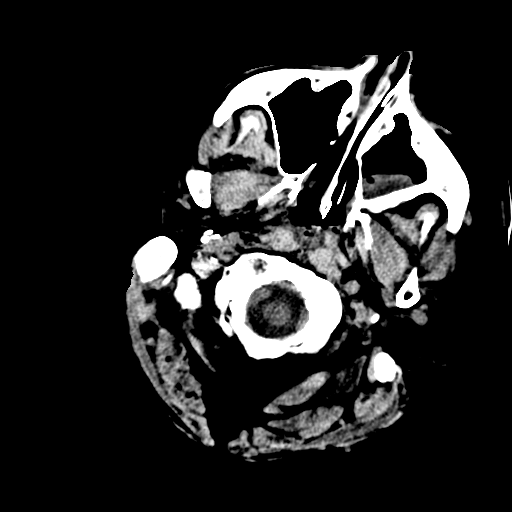
[im 2/36  bone]
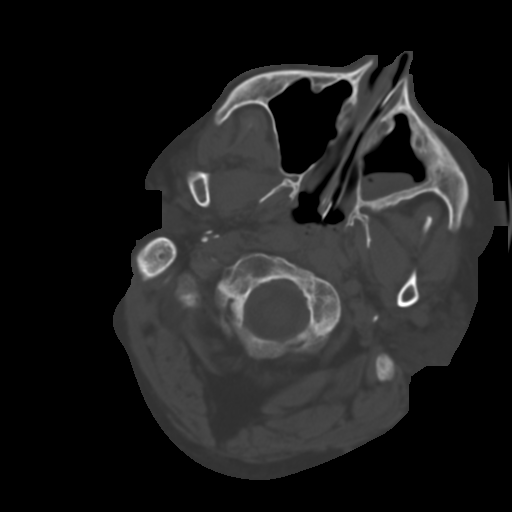
[im 4/36  brain]
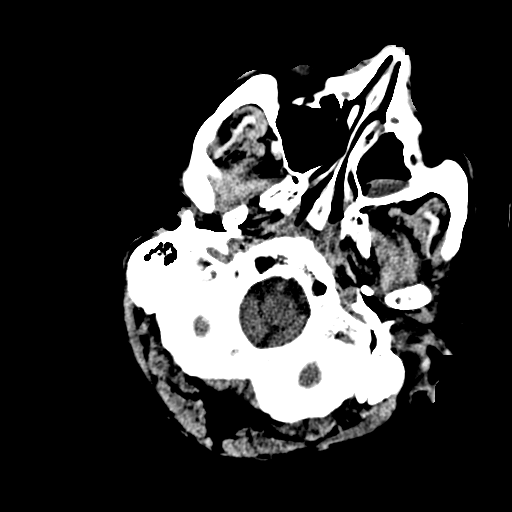
[im 7/36  brain]
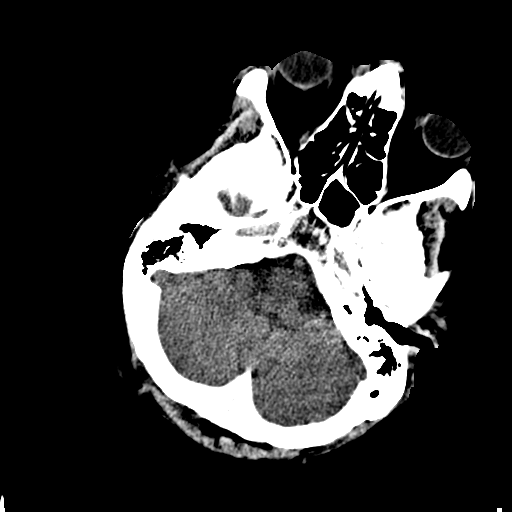
[im 9/36  brain]
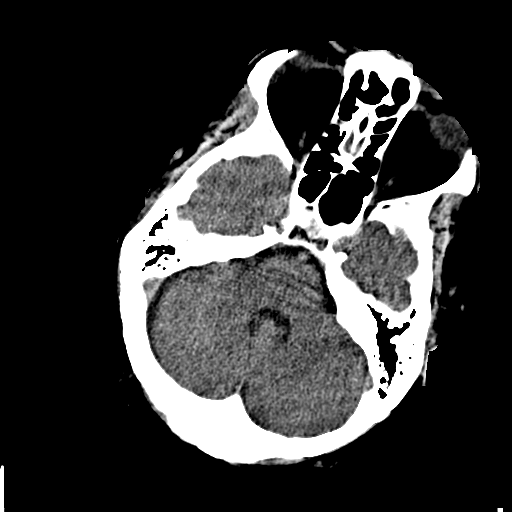
[im 11/36  brain]
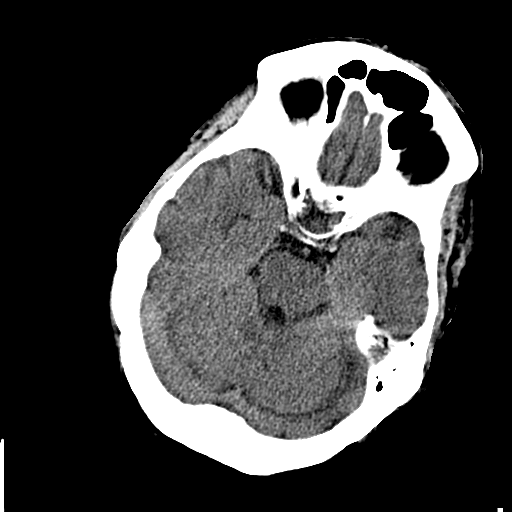
[im 11/36  bone]
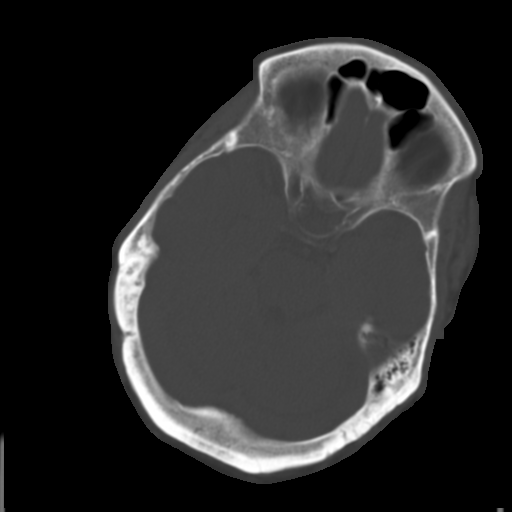
[im 12/36  brain]
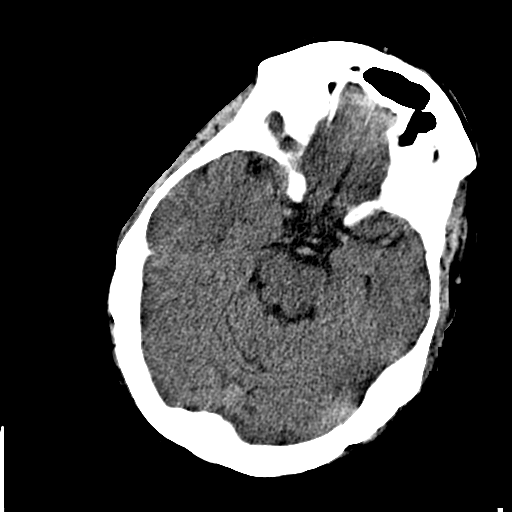
[im 16/36  brain]
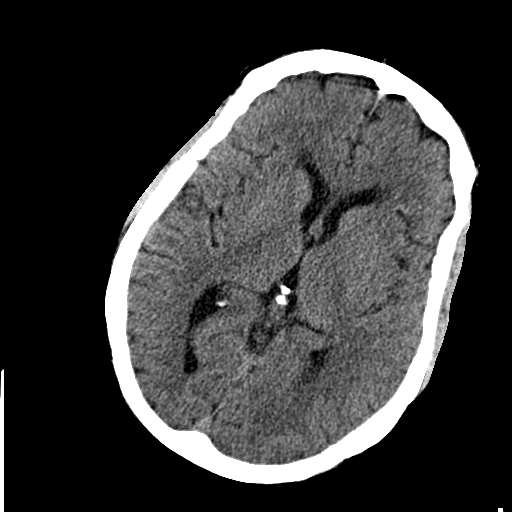
[im 17/36  brain]
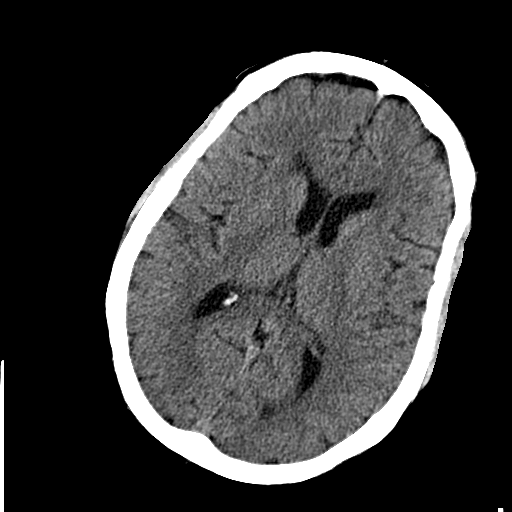
[im 19/36  brain]
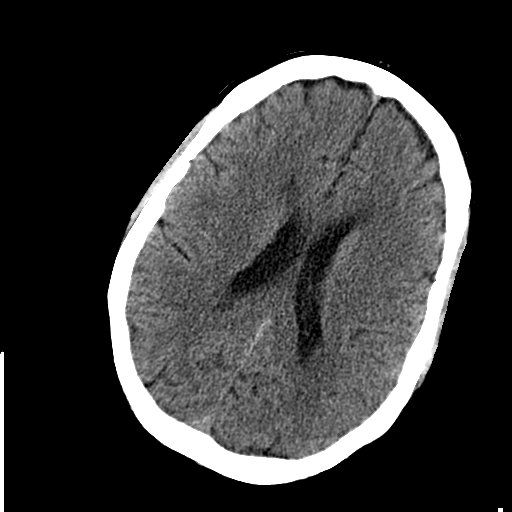
[im 19/36  bone]
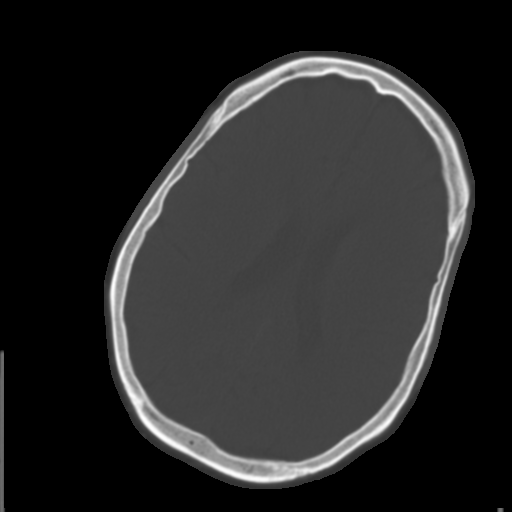
[im 21/36  brain]
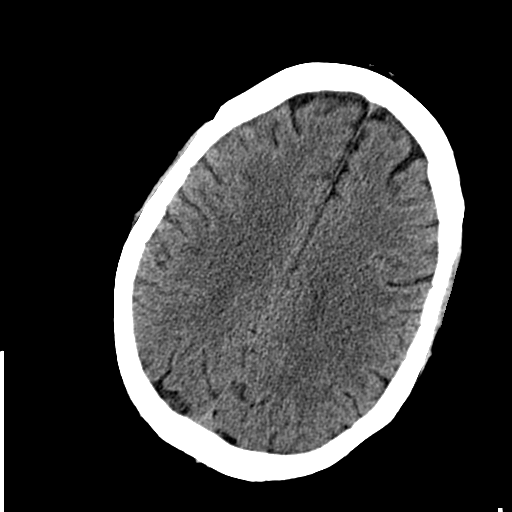
[im 24/36  brain]
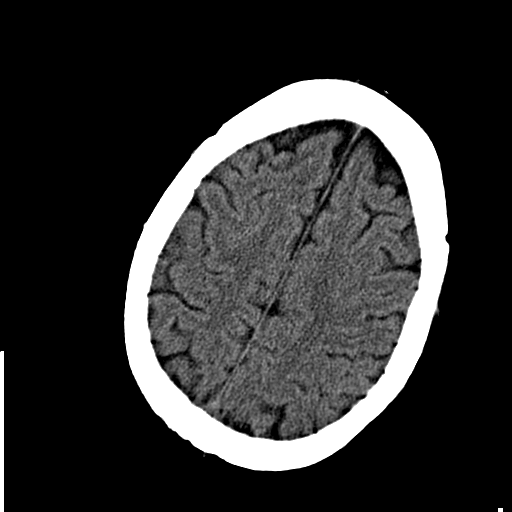
[im 26/36  brain]
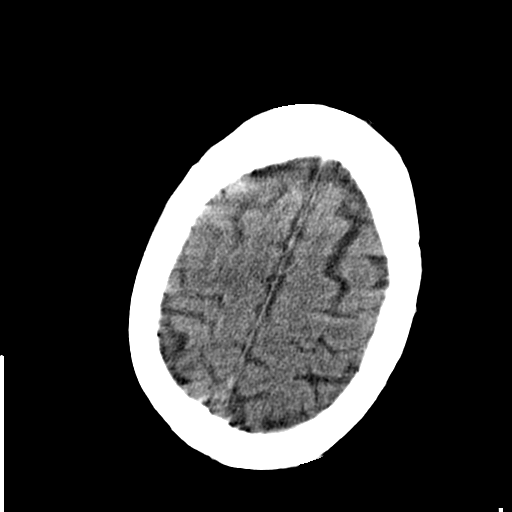
[im 27/36  brain]
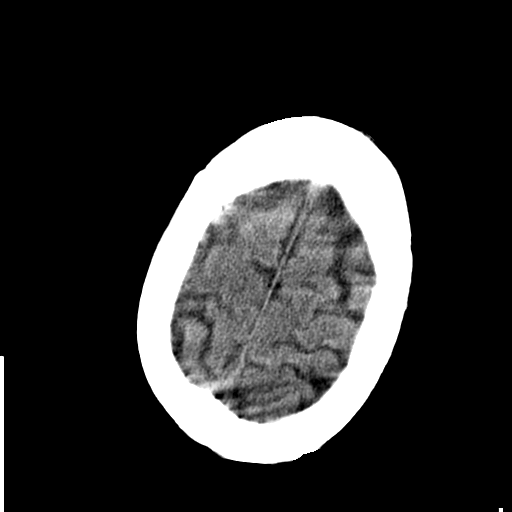
[im 27/36  bone]
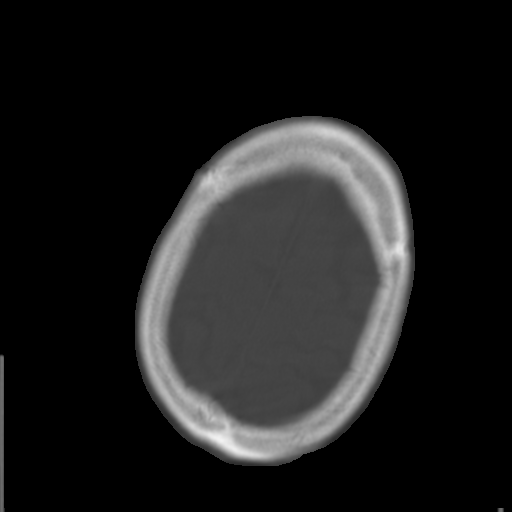
[im 29/36  brain]
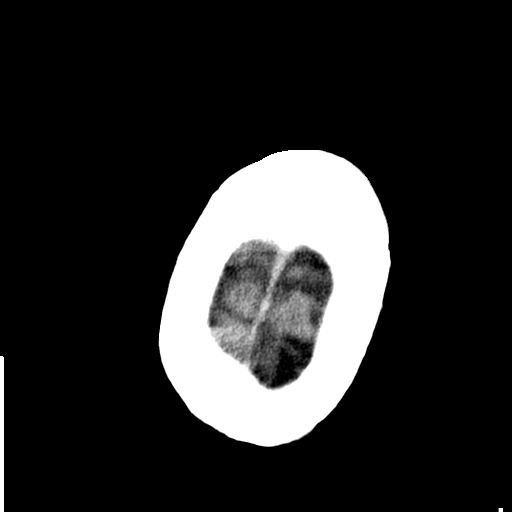
[im 32/36  brain]
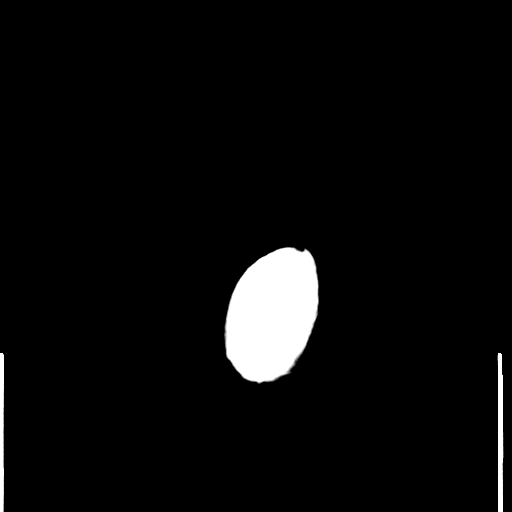
[im 34/36  brain]
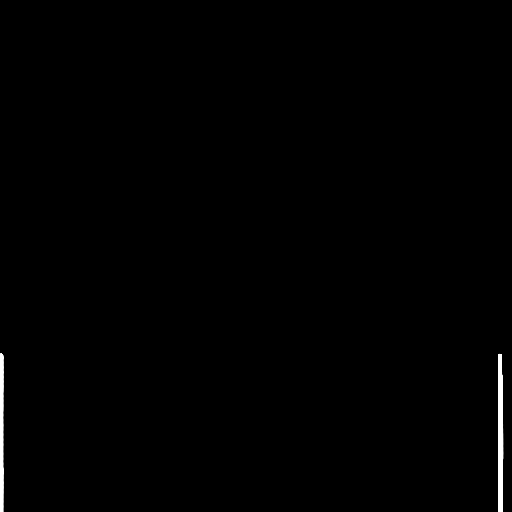

[16 of 30 positions shown; findings below may reference images not displayed]

FINDINGS: There is mild motion artifact on this examination. No evidence for
acute hemorrhage, mass lesion, midline shift, hydrocephalus or large
infarct. Patient has a nasogastric tube. There is mucosal thickening
along the posterior left maxillary sinus. No acute bone abnormality.
There is subtle low-density in the right frontal white matter, best
seen on sequence 2, image 22.
IMPRESSION: No acute intracranial abnormality.

Subtle white matter disease is nonspecific. Findings could represent
chronic small vessel ischemic changes.

Left maxillary sinus disease.

## 2016-11-11 NOTE — Patient Instructions (Addendum)
William [name]  11/11/2016   Your procedure is scheduled on: 11/23/16  Report to [facility]  Entrance [place]vators to 3rd floor to  Short Stay Center at    0720 AM.  Call this number if you have problems the morning of surgery [phone]   Remember: ONLY 1 PERSON MAY GO WITH YOU TO SHORT STAY TO GET  READY MORNING OF YOUR SURGERY.  Do not eat food or drink liquids :After Midnight.     Take these medicines the morning of surgery with A SIP OF WATER: Synthroid, Metporolol, protonix, oxycodone if needed.                                You may not have any metal on your body including hair pins and              piercings  Do not wear jewelry,lotions, powders or perfumes, deodorant                        Men may shave face and neck.   Do not bring valuables to the hospital. [facility] IS NOT             RESPONSIBLE   FOR VALUABLES.  Contacts, dentures or bridgework may not be worn into surgery.  Leave suitcase in the car. After surgery it may be brought to your room.                Please read over the following fact sheets you were given: _____________________________________________________________________             [facility] - Preparing for Surgery Before surgery, you can play an important role.  Because skin is not sterile, your skin needs to be as free of germs as possible.  You can reduce the number of germs on your skin by washing with CHG (chlorahexidine gluconate) soap before surgery.  CHG is an antiseptic cleaner which kills germs and bonds with the skin to continue killing germs even after washing. Please DO NOT use if you have an allergy to CHG or antibacterial soaps.  If your skin becomes reddened/irritated stop using the CHG and inform your nurse when you arrive at Short Stay. Do not shave (including legs and underarms) for at least 48 hours prior to the first CHG shower.  You may shave your face/neck. Please follow these  instructions carefully:  1.  Shower with CHG Soap the night before surgery and the  morning of Surgery.  2.  If you choose to wash your hair, wash your hair first as usual with your  normal  shampoo.  3.  After you shampoo, rinse your hair and body thoroughly to remove the  shampoo.                           4.  Use CHG as you would any other liquid soap.  You can apply chg directly  to the skin and wash                       Gently with a scrungie or clean washcloth.  5.  Apply the CHG Soap to your body ONLY FROM THE NECK DOWN.   Do not use on  face/ open                           Wound or open sores. Avoid contact with eyes, ears mouth and genitals (private parts).                       Wash face,  Genitals (private parts) with your normal soap.             6.  Wash thoroughly, paying special attention to the area where your surgery  will be performed.  7.  Thoroughly rinse your body with warm water from the neck down.  8.  DO NOT shower/wash with your normal soap after using and rinsing off  the CHG Soap.                9.  Pat yourself dry with a clean towel.            10.  Wear clean pajamas.            11.  Place clean sheets on your bed the night of your first shower and do not  sleep with pets. Day of Surgery : Do not apply any lotions/deodorants the morning of surgery.  Please wear clean clothes to the hospital/surgery center.  FAILURE TO FOLLOW THESE INSTRUCTIONS MAY RESULT IN THE CANCELLATION OF YOUR SURGERY PATIENT SIGNATURE_________________________________  NURSE SIGNATURE__________________________________  ________________________________________________________________________  WHAT IS A BLOOD TRANSFUSION? Blood Transfusion Information  A transfusion is the replacement of blood or some of its parts. Blood is made up of multiple cells which provide different functions.  Red blood cells carry oxygen and are used for blood loss replacement.  White blood cells fight against  infection.  Platelets control bleeding.  Plasma helps clot blood.  Other blood products are available for specialized needs, such as hemophilia or other clotting disorders. BEFORE THE TRANSFUSION  Who gives blood for transfusions?   Healthy volunteers who are fully evaluated to make sure their blood is safe. This is blood bank blood. Transfusion therapy is the safest it has ever been in the practice of medicine. Before blood is taken from a donor, a complete history is taken to make sure that person has no history of diseases nor engages in risky social behavior (examples are intravenous drug use or sexual activity with multiple partners). The donor's travel history is screened to minimize risk of transmitting infections, such as malaria. The donated blood is tested for signs of infectious diseases, such as HIV and hepatitis. The blood is then tested to be sure it is compatible with you in order to minimize the chance of a transfusion reaction. If you or a relative donates blood, this is often done in anticipation of surgery and is not appropriate for emergency situations. It takes many days to process the donated blood. RISKS AND COMPLICATIONS Although transfusion therapy is very safe and saves many lives, the main dangers of transfusion include:   Getting an infectious disease.  Developing a transfusion reaction. This is an allergic reaction to something in the blood you were given. Every precaution is taken to prevent this. The decision to have a blood transfusion has been considered carefully by your caregiver before blood is given. Blood is not given unless the benefits outweigh the risks. AFTER THE TRANSFUSION  Right after receiving a blood transfusion, you will usually feel much better and more energetic. This is especially true if your red  blood cells have gotten low (anemic). The transfusion raises the level of the red blood cells which carry oxygen, and this usually causes an energy  increase.  The nurse administering the transfusion will monitor you carefully for complications. HOME CARE INSTRUCTIONS  No special instructions are needed after a transfusion. You may find your energy is better. Speak with your caregiver about any limitations on activity for underlying diseases you may have. SEEK MEDICAL CARE IF:   Your condition is not improving after your transfusion.  You develop redness or irritation at the intravenous (IV) site. SEEK IMMEDIATE MEDICAL CARE IF:  Any of the following symptoms occur over the next 12 hours:  Shaking chills.  You have a temperature by mouth above 102 F (38.9 C), not controlled by medicine.  Chest, back, or muscle pain.  People around you feel you are not acting correctly or are confused.  Shortness of breath or difficulty breathing.  Dizziness and fainting.  You get a rash or develop hives.  You have a decrease in urine output.  Your urine turns a dark color or changes to pink, red, or brown. Any of the following symptoms occur over the next 10 days:  You have a temperature by mouth above 102 F (38.9 C), not controlled by medicine.  Shortness of breath.  Weakness after normal activity.  The white part of the eye turns yellow (jaundice).  You have a decrease in the amount of urine or are urinating less often.  Your urine turns a dark color or changes to pink, red, or brown. Document Released: 10/01/2000 Document Revised: 12/27/2011 Document Reviewed: 05/20/2008 ExitCare Patient Information 2014 [place].  _______________________________________________________________________  Incentive Spirometer  An incentive spirometer is a tool that can help keep your lungs clear and active. This tool measures how well you are filling your lungs with each breath. Taking long deep breaths may help reverse or decrease the chance of developing breathing (pulmonary) problems (especially infection) following:  A long  period of time when you are unable to move or be active. BEFORE THE PROCEDURE   If the spirometer includes an indicator to show your best effort, your nurse or respiratory therapist will set it to a desired goal.  If possible, sit up straight or lean slightly forward. Try not to slouch.  Hold the incentive spirometer in an upright position. INSTRUCTIONS FOR USE  1. Sit on the edge of your bed if possible, or sit up as far as you can in bed or on a chair. 2. Hold the incentive spirometer in an upright position. 3. Breathe out normally. 4. Place the mouthpiece in your mouth and seal your lips tightly around it. 5. Breathe in slowly and as deeply as possible, raising the piston or the ball toward the top of the column. 6. Hold your breath for 3-5 seconds or for as long as possible. Allow the piston or ball to fall to the bottom of the column. 7. Remove the mouthpiece from your mouth and breathe out normally. 8. Rest for a few seconds and repeat Steps 1 through 7 at least 10 times every 1-2 hours when you are awake. Take your time and take a few normal breaths between deep breaths. 9. The spirometer may include an indicator to show your best effort. Use the indicator as a goal to work toward during each repetition. 10. After each set of 10 deep breaths, practice coughing to be sure your lungs are clear. If you have an incision (the cut  made at the time of surgery), support your incision when coughing by placing a pillow or rolled up towels firmly against it. Once you are able to get out of bed, walk around indoors and cough well. You may stop using the incentive spirometer when instructed by your caregiver.  RISKS AND COMPLICATIONS  Take your time so you do not get dizzy or light-headed.  If you are in pain, you may need to take or ask for pain medication before doing incentive spirometry. It is harder to take a deep breath if you are having pain. AFTER USE  Rest and breathe slowly and  easily.  It can be helpful to keep track of a log of your progress. Your caregiver can provide you with a simple table to help with this. If you are using the spirometer at home, follow these instructions: [place] IF:   You are having difficultly using the spirometer.  You have trouble using the spirometer as often as instructed.  Your pain medication is not giving enough relief while using the spirometer.  You develop fever of 100.5 F (38.1 C) or higher. SEEK IMMEDIATE MEDICAL CARE IF:   You cough up bloody sputum that had not been present before.  You develop fever of 102 F (38.9 C) or greater.  You develop worsening pain at or near the incision site. MAKE SURE YOU:   Understand these instructions.  Will watch your condition.  Will get help right away if you are not doing well or get worse. Document Released: 02/14/2007 Document Revised: 12/27/2011 Document Reviewed: 04/17/2007 [facility] Patient Information 2014 [place], [place].   ________________________________________________________________________

## 2016-11-15 ENCOUNTER — Encounter (HOSPITAL_COMMUNITY)
Admission: RE | Admit: 2016-11-15 | Discharge: 2016-11-15 | Disposition: A | Discharge status: Home / Self Care | Payer: 59 | Source: Ambulatory Visit | Attending: Orthopedic Surgery | Admitting: Orthopedic Surgery

## 2016-11-15 ENCOUNTER — Encounter (HOSPITAL_COMMUNITY): Payer: Self-pay

## 2016-11-15 DIAGNOSIS — Z01812 Encounter for preprocedural laboratory examination: Secondary | ICD-10-CM | POA: Diagnosis not present

## 2016-11-15 HISTORY — DX: Headache: R51

## 2016-11-15 HISTORY — DX: Pneumonia, unspecified organism: J18.9

## 2016-11-15 HISTORY — DX: Personal history of urinary calculi: Z87.442

## 2016-11-15 HISTORY — DX: Headache, unspecified: R51.9

## 2016-11-15 LAB — SURGICAL PCR SCREEN
MRSA, PCR: NEGATIVE
Staphylococcus aureus: POSITIVE — AB

## 2016-11-15 LAB — ABO/RH: ABO/RH(D): O NEG

## 2016-11-15 NOTE — Progress Notes (Signed)
Clearance 11[facility]/16 [facility] on chart CBC, CMP, Ua, PT from 11/01/16 on chart EKG 11/02/16 on chart.

## 2016-11-19 NOTE — Progress Notes (Signed)
Patent notified of time change for surgery on 11/23/2016 and instructed to arrive at [facility] at 0700 . Patient verbalized understanding.

## 2016-11-21 NOTE — H&P (Signed)
TOTAL KNEE ADMISSION H&P  Patient is being admitted for left total knee arthroplasty.  Subjective:  Chief Complaint:   Left knee primary OA / pain  HPI: William [name], 66 y.o. male, has a history of pain and functional disability in the left knee due to arthritis and has failed non-surgical conservative treatments for greater than 12 weeks to include NSAID's and/or analgesics, corticosteriod injections, viscosupplementation injections, use of assistive devices and activity modification.  Onset of symptoms was gradual, starting >10 years ago with gradually worsening course since that time. The patient noted no past surgery on the left knee(s).  Patient currently rates pain in the left knee(s) at 8 out of 10 with activity. Patient has night pain, worsening of pain with activity and weight bearing, pain that interferes with activities of daily living, pain with passive range of motion, crepitus and joint swelling.  Patient has evidence of periarticular osteophytes and joint space narrowing by imaging studies.  There is no active infection.  Risks, benefits and expectations were discussed with the patient.  Risks including but not limited to the risk of anesthesia, blood clots, nerve damage, blood vessel damage, failure of the prosthesis, infection and up to and including death.  Patient understand the risks, benefits and expectations and wishes to proceed with surgery.   PCP: Pcp Not In System  D/C Plans:       Home - OPPT  @ [facility], script already given  Post-op Meds:       No Rx given  Tranexamic Acid:      To be given - IV   Decadron:      Is to be given  FYI:     ASA  Norco    Patient Active Problem List   Diagnosis Date Noted  . H/O cardiovascular stress test   . S/P colostomy takedown 06/19/2015  . S/P colostomy (HCC) 11/21/2014  . Hypothyroidism 11/20/2014  . Essential hypertension 11/20/2014  . Acute on chronic respiratory failure (HCC) 11/19/2014  . Bacteroides fragilis infection  11/12/2014  . Normocytic anemia 11/12/2014  . Solitary pulmonary nodule on lung CT 10/28/2014  . Obstructive sleep apnea 10/28/2014  . Diverticulitis of colon with perforation s/p colectomy/colostomy 10/28/2014 10/27/2014   Past Medical History:  Diagnosis Date  . Anxiety   . Arthritis   . COPD (chronic obstructive pulmonary disease) (HCC)   . Depression   . Diverticulitis   . Essential hypertension 11/20/2014  . GERD (gastroesophageal reflux disease)   . H/O cardiovascular stress test    06/20/14 Nuclear stress test [facility]): Rest and stress images are WNL. No significant reversible ischemia or fixed scar. Gated LVEF 55%. NL LV wall motion    . Headache    hx of migraines  . History of kidney stones   . History of pneumonia   . History of sepsis January 2016  . Hypertension   . Hypothyroidism 11/20/2014  . Pneumonia    hx of  . Protein-calorie malnutrition (HCC) 11/20/2014  . Renal disorder    kidney stones  . Shortness of breath dyspnea   . Sleep apnea    nasal 13.5 settings  . Thyroid disease   . Wears glasses     Past Surgical History:  Procedure Laterality Date  . COLONOSCOPY    . COLOSTOMY N/A 10/28/2014   Procedure: COLOSTOMY;  Surgeon: Chevis PrettyPaul Toth III, MD;  Location: [facility] ORS;  Service: General;  Laterality: N/A;  . COLOSTOMY TAKEDOWN  06/19/2015  . COLOSTOMY TAKEDOWN  N/A 06/19/2015   Procedure: COLOSTOMY TAKEDOWN;  Surgeon: [name], MD;  Location: [facility] OR;  Service: General;  Laterality: N/A;  . FINGER AMPUTATION Left    index finger  . LAPAROTOMY N/A 10/28/2014   Procedure: EXPLORATORY LAPAROTOMY;  Surgeon: [name], MD;  Location: [facility] ORS;  Service: General;  Laterality: N/A;  . PARTIAL COLECTOMY N/A 10/28/2014   Procedure: PARTIAL COLECTOMY Sigmoid;  Surgeon: [name], MD;  Location: [facility] ORS;  Service: General;  Laterality: N/A;  . TRACHEOSTOMY TUBE PLACEMENT N/A 11/08/2014   Procedure: TRACHEOSTOMY;  Surgeon: [name], MD;  Location: [facility]  ORS;  Service: ENT;  Laterality: N/A;    No prescriptions prior to admission.   Allergies  Allergen Reactions  . Aloprim [Allopurinol] Other (See Comments)    "MUSCLE SEIZED UP"  . Simvastatin Other (See Comments)    Muscle aches.  . Penicillins Rash    Has patient had a PCN reaction causing immediate rash, facial/tongue/throat swelling, SOB or lightheadedness with hypotension: No Has patient had a PCN reaction causing severe rash involving mucus membranes or skin necrosis: No Has patient had a PCN reaction that required hospitalization No Has patient had a PCN reaction occurring within the last 10 years: Yes If all of the above answers are "NO", then may proceed with Cephalosporin use.     Social History  Substance Use Topics  . Smoking status: Former Smoker    Quit date: 10/18/2004  . Smokeless tobacco: Never Used     Comment: only 3 months for smokeless  . Alcohol use 0.6 oz/week    1 Glasses of wine per week     Comment: occassionally     Family History  Problem Relation Age of Onset  . Colon cancer Neg Hx      Review of Systems  Constitutional: Negative.   HENT: Negative.   Eyes: Negative.   Respiratory: Positive for shortness of breath (with exertion).   Cardiovascular: Negative.   Gastrointestinal: Positive for heartburn.  Genitourinary: Negative.   Musculoskeletal: Positive for joint pain.  Skin: Negative.   Neurological: Positive for headaches.  Endo/Heme/Allergies: Negative.   Psychiatric/Behavioral: Positive for depression. The patient is nervous/anxious.     Objective:  Physical Exam  Constitutional: He is oriented to person, place, and time. He appears well-developed.  HENT:  Head: Normocephalic.  Eyes: Pupils are equal, round, and reactive to light.  Neck: Neck supple. No JVD present. No tracheal deviation present. No thyromegaly present.  Cardiovascular: Normal rate, regular rhythm, normal heart sounds and intact distal pulses.   Respiratory:  Effort normal and breath sounds normal. No respiratory distress. He has no wheezes.  GI: Soft. There is no tenderness. There is no guarding.  Musculoskeletal:       Left knee: He exhibits decreased range of motion, swelling and bony tenderness. He exhibits no ecchymosis, no deformity, no laceration and no erythema. Tenderness found.  Lymphadenopathy:    He has no cervical adenopathy.  Neurological: He is alert and oriented to person, place, and time.  Skin: Skin is warm and dry.  Psychiatric: He has a normal mood and affect.     Labs:  Estimated body mass index is 41.94 kg/m as calculated from the following:   Height as of 11/15/16: 5\' 9"  (1.753 m).   Weight as of 11/15/16: 128.8 kg (284 lb).   Imaging Review Plain radiographs demonstrate severe degenerative joint disease of the left knee(s). The overall alignment is significant varus. The bone  quality appears to be good for age and reported activity level.  Assessment/Plan:  End stage arthritis, left knee   The patient history, physical examination, clinical judgment of the provider and imaging studies are consistent with end stage degenerative joint disease of the left knee(s) and total knee arthroplasty is deemed medically necessary. The treatment options including medical management, injection therapy arthroscopy and arthroplasty were discussed at length. The risks and benefits of total knee arthroplasty were presented and reviewed. The risks due to aseptic loosening, infection, stiffness, patella tracking problems, thromboembolic complications and other imponderables were discussed. The patient acknowledged the explanation, agreed to proceed with the plan and consent was signed. Patient is being admitted for inpatient treatment for surgery, pain control, PT, OT, prophylactic antibiotics, VTE prophylaxis, progressive ambulation and ADL's and discharge planning. The patient is planning to be discharged home.     Anastasio Auerbach Ray Gervasi    PA-C  11/21/2016, 2:45 PM

## 2016-11-22 MED ORDER — CEFAZOLIN SODIUM 10 G IJ SOLR
3.0000 g | INTRAMUSCULAR | Status: AC
  Administered 2016-11-23: 3 g via INTRAVENOUS
  Filled 2016-11-22: qty 3000
  Filled 2016-11-22: qty 3

## 2016-11-22 NOTE — Anesthesia Preprocedure Evaluation (Addendum)
Anesthesia Evaluation  Patient identified by MRN, date of birth, ID band Patient awake    Reviewed: Allergy & Precautions, H&P , NPO status , Patient's Chart, lab work & pertinent test results, reviewed documented beta blocker date and time   Airway Mallampati: II  TM Distance: >3 FB Neck ROM: Full    Dental no notable dental hx. (+) Edentulous Upper, Edentulous Lower, Dental Advisory Given   Pulmonary sleep apnea and Continuous Positive Airway Pressure Ventilation , COPD, former smoker,    Pulmonary exam normal breath sounds clear to auscultation       Cardiovascular Exercise Tolerance: Good hypertension, Pt. on medications and Pt. on home beta blockers  Rhythm:Regular Rate:Normal     Neuro/Psych Anxiety Depression negative neurological ROS  negative psych ROS   GI/Hepatic Neg liver ROS, GERD  Medicated and Controlled,  Endo/Other  Hypothyroidism Morbid obesity  Renal/GU negative Renal ROS  negative genitourinary   Musculoskeletal  (+) Arthritis , Osteoarthritis,    Abdominal   Peds  Hematology  (+) anemia ,   Anesthesia Other Findings   Reproductive/Obstetrics negative OB ROS                            Anesthesia Physical Anesthesia Plan  ASA: III  Anesthesia Plan: Spinal   Post-op Pain Management:  Regional for Post-op pain   Induction: Intravenous  Airway Management Planned: Simple Face Mask  Additional Equipment:   Intra-op Plan:   Post-operative Plan:   Informed Consent: I have reviewed the patients History and Physical, chart, labs and discussed the procedure including the risks, benefits and alternatives for the proposed anesthesia with the patient or authorized representative who has indicated his/her understanding and acceptance.   Dental advisory given  Plan Discussed with: CRNA  Anesthesia Plan Comments:         Anesthesia Quick Evaluation

## 2016-11-23 ENCOUNTER — Observation Stay (HOSPITAL_COMMUNITY)
Admission: RE | Admit: 2016-11-23 | Discharge: 2016-11-24 | Disposition: A | Discharge status: Home / Self Care | Payer: 59 | Source: Ambulatory Visit | Attending: Orthopedic Surgery | Admitting: Orthopedic Surgery

## 2016-11-23 ENCOUNTER — Encounter (HOSPITAL_COMMUNITY)
Admission: RE | Disposition: A | Discharge status: Home / Self Care | Payer: Self-pay | Source: Ambulatory Visit | Attending: Orthopedic Surgery

## 2016-11-23 ENCOUNTER — Encounter (HOSPITAL_COMMUNITY): Payer: Self-pay | Admitting: *Deleted

## 2016-11-23 ENCOUNTER — Inpatient Hospital Stay (HOSPITAL_COMMUNITY): Payer: 59 | Admitting: Anesthesiology

## 2016-11-23 DIAGNOSIS — Z96652 Presence of left artificial knee joint: Secondary | ICD-10-CM

## 2016-11-23 DIAGNOSIS — Z6841 Body Mass Index (BMI) 40.0 and over, adult: Secondary | ICD-10-CM | POA: Insufficient documentation

## 2016-11-23 DIAGNOSIS — K219 Gastro-esophageal reflux disease without esophagitis: Secondary | ICD-10-CM | POA: Diagnosis not present

## 2016-11-23 DIAGNOSIS — I10 Essential (primary) hypertension: Secondary | ICD-10-CM | POA: Insufficient documentation

## 2016-11-23 DIAGNOSIS — E039 Hypothyroidism, unspecified: Secondary | ICD-10-CM | POA: Diagnosis not present

## 2016-11-23 DIAGNOSIS — G4733 Obstructive sleep apnea (adult) (pediatric): Secondary | ICD-10-CM | POA: Insufficient documentation

## 2016-11-23 DIAGNOSIS — Z888 Allergy status to other drugs, medicaments and biological substances status: Secondary | ICD-10-CM | POA: Insufficient documentation

## 2016-11-23 DIAGNOSIS — M65862 Other synovitis and tenosynovitis, left lower leg: Secondary | ICD-10-CM | POA: Diagnosis not present

## 2016-11-23 DIAGNOSIS — M25462 Effusion, left knee: Secondary | ICD-10-CM | POA: Diagnosis not present

## 2016-11-23 DIAGNOSIS — F329 Major depressive disorder, single episode, unspecified: Secondary | ICD-10-CM | POA: Insufficient documentation

## 2016-11-23 DIAGNOSIS — M1712 Unilateral primary osteoarthritis, left knee: Principal | ICD-10-CM | POA: Insufficient documentation

## 2016-11-23 DIAGNOSIS — M25762 Osteophyte, left knee: Secondary | ICD-10-CM | POA: Insufficient documentation

## 2016-11-23 DIAGNOSIS — Z880 Allergy status to penicillin: Secondary | ICD-10-CM | POA: Diagnosis not present

## 2016-11-23 DIAGNOSIS — Z7982 Long term (current) use of aspirin: Secondary | ICD-10-CM | POA: Insufficient documentation

## 2016-11-23 DIAGNOSIS — J449 Chronic obstructive pulmonary disease, unspecified: Secondary | ICD-10-CM | POA: Diagnosis not present

## 2016-11-23 DIAGNOSIS — F419 Anxiety disorder, unspecified: Secondary | ICD-10-CM | POA: Diagnosis not present

## 2016-11-23 HISTORY — PX: TOTAL KNEE ARTHROPLASTY: SHX125

## 2016-11-23 LAB — TYPE AND SCREEN
ABO/RH(D): O NEG
ANTIBODY SCREEN: NEGATIVE

## 2016-11-23 SURGERY — ARTHROPLASTY, KNEE, TOTAL
Anesthesia: Spinal | Site: Knee | Laterality: Left

## 2016-11-23 MED ORDER — FENTANYL CITRATE (PF) 100 MCG/2ML IJ SOLN
50.0000 ug | INTRAMUSCULAR | Status: DC | PRN
  Administered 2016-11-23: 50 ug via INTRAVENOUS

## 2016-11-23 MED ORDER — DEXAMETHASONE SODIUM PHOSPHATE 10 MG/ML IJ SOLN
10.0000 mg | Freq: Once | INTRAMUSCULAR | Status: AC
  Administered 2016-11-23: 10 mg via INTRAVENOUS

## 2016-11-23 MED ORDER — ASPIRIN 81 MG PO CHEW
81.0000 mg | CHEWABLE_TABLET | Freq: Two times a day (BID) | ORAL | Status: DC
  Administered 2016-11-23 – 2016-11-24 (×2): 81 mg via ORAL
  Filled 2016-11-23 (×2): qty 1

## 2016-11-23 MED ORDER — PROPOFOL 10 MG/ML IV BOLUS
INTRAVENOUS | Status: AC
  Filled 2016-11-23: qty 20

## 2016-11-23 MED ORDER — PHENYLEPHRINE 40 MCG/ML (10ML) SYRINGE FOR IV PUSH (FOR BLOOD PRESSURE SUPPORT)
PREFILLED_SYRINGE | INTRAVENOUS | Status: DC | PRN
  Administered 2016-11-23 (×2): 80 ug via INTRAVENOUS

## 2016-11-23 MED ORDER — FERROUS SULFATE 325 (65 FE) MG PO TABS: 325.0000 mg | ORAL_TABLET | Freq: Three times a day (TID) | ORAL | Status: DC

## 2016-11-23 MED ORDER — DEXAMETHASONE SODIUM PHOSPHATE 10 MG/ML IJ SOLN
10.0000 mg | Freq: Once | INTRAMUSCULAR | Status: AC
  Administered 2016-11-24: 08:00:00 10 mg via INTRAVENOUS
  Filled 2016-11-23: qty 1

## 2016-11-23 MED ORDER — KETOROLAC TROMETHAMINE 30 MG/ML IJ SOLN
INTRAMUSCULAR | Status: AC
  Filled 2016-11-23: qty 1

## 2016-11-23 MED ORDER — FENTANYL CITRATE (PF) 100 MCG/2ML IJ SOLN
INTRAMUSCULAR | Status: AC
  Filled 2016-11-23: qty 2

## 2016-11-23 MED ORDER — FERROUS SULFATE 325 (65 FE) MG PO TABS
325.0000 mg | ORAL_TABLET | Freq: Three times a day (TID) | ORAL | Status: DC
  Administered 2016-11-23: 18:00:00 325 mg via ORAL
  Filled 2016-11-23: qty 1

## 2016-11-23 MED ORDER — POLYETHYLENE GLYCOL 3350 17 G PO PACK: 17.0000 g | PACK | Freq: Two times a day (BID) | ORAL | 0 refills | Status: DC

## 2016-11-23 MED ORDER — CEFAZOLIN SODIUM-DEXTROSE 2-4 GM/100ML-% IV SOLN
2.0000 g | Freq: Four times a day (QID) | INTRAVENOUS | Status: AC
  Administered 2016-11-23 (×2): 2 g via INTRAVENOUS
  Filled 2016-11-23 (×2): qty 100

## 2016-11-23 MED ORDER — METOCLOPRAMIDE HCL 5 MG PO TABS: 5.0000 mg | ORAL_TABLET | Freq: Three times a day (TID) | ORAL | Status: DC | PRN

## 2016-11-23 MED ORDER — MENTHOL 3 MG MT LOZG: 1.0000 | LOZENGE | OROMUCOSAL | Status: DC | PRN

## 2016-11-23 MED ORDER — BUPIVACAINE HCL (PF) 0.25 % IJ SOLN
INTRAMUSCULAR | Status: DC | PRN
  Administered 2016-11-23: 20 mL

## 2016-11-23 MED ORDER — TIZANIDINE HCL 4 MG PO CAPS: 4.0000 mg | ORAL_CAPSULE | Freq: Three times a day (TID) | ORAL | 0 refills | Status: DC | PRN

## 2016-11-23 MED ORDER — TRANEXAMIC ACID 1000 MG/10ML IV SOLN
1000.0000 mg | INTRAVENOUS | Status: AC
  Administered 2016-11-23: 1000 mg via INTRAVENOUS
  Filled 2016-11-23: qty 1100

## 2016-11-23 MED ORDER — SODIUM CHLORIDE 0.9 % IJ SOLN
INTRAMUSCULAR | Status: DC | PRN
  Administered 2016-11-23: 30 mL

## 2016-11-23 MED ORDER — MIDAZOLAM HCL 5 MG/5ML IJ SOLN
INTRAMUSCULAR | Status: DC | PRN
  Administered 2016-11-23: 2 mg via INTRAVENOUS

## 2016-11-23 MED ORDER — FENTANYL CITRATE (PF) 100 MCG/2ML IJ SOLN
INTRAMUSCULAR | Status: DC | PRN
  Administered 2016-11-23: 100 ug via INTRAVENOUS

## 2016-11-23 MED ORDER — BUPIVACAINE HCL (PF) 0.25 % IJ SOLN
INTRAMUSCULAR | Status: AC
  Filled 2016-11-23: qty 30

## 2016-11-23 MED ORDER — DOCUSATE SODIUM 100 MG PO CAPS
100.0000 mg | ORAL_CAPSULE | Freq: Two times a day (BID) | ORAL | Status: DC
  Administered 2016-11-23 – 2016-11-24 (×2): 100 mg via ORAL
  Filled 2016-11-23 (×2): qty 1

## 2016-11-23 MED ORDER — DEXAMETHASONE SODIUM PHOSPHATE 10 MG/ML IJ SOLN
INTRAMUSCULAR | Status: AC
  Filled 2016-11-23: qty 1

## 2016-11-23 MED ORDER — CHLORHEXIDINE GLUCONATE 4 % EX LIQD: 60.0000 mL | Freq: Once | CUTANEOUS | Status: DC

## 2016-11-23 MED ORDER — MAGNESIUM CITRATE PO SOLN: 1.0000 | Freq: Once | ORAL | Status: DC | PRN

## 2016-11-23 MED ORDER — ONDANSETRON HCL 4 MG PO TABS: 4.0000 mg | ORAL_TABLET | Freq: Four times a day (QID) | ORAL | Status: DC | PRN

## 2016-11-23 MED ORDER — PHENYLEPHRINE HCL 10 MG/ML IJ SOLN
INTRAMUSCULAR | Status: AC
  Filled 2016-11-23: qty 1

## 2016-11-23 MED ORDER — ASPIRIN 81 MG PO CHEW: 81.0000 mg | CHEWABLE_TABLET | Freq: Two times a day (BID) | ORAL | 0 refills | Status: AC

## 2016-11-23 MED ORDER — HYDROMORPHONE HCL 1 MG/ML IJ SOLN
0.5000 mg | INTRAMUSCULAR | Status: DC | PRN
  Administered 2016-11-23 – 2016-11-24 (×2): 1 mg via INTRAVENOUS
  Filled 2016-11-23 (×2): qty 1

## 2016-11-23 MED ORDER — METHOCARBAMOL 1000 MG/10ML IJ SOLN
500.0000 mg | Freq: Four times a day (QID) | INTRAMUSCULAR | Status: DC | PRN
  Administered 2016-11-23: 500 mg via INTRAVENOUS
  Filled 2016-11-23: qty 5
  Filled 2016-11-23: qty 550

## 2016-11-23 MED ORDER — ALUM & MAG HYDROXIDE-SIMETH 200-200-20 MG/5ML PO SUSP: 30.0000 mL | ORAL | Status: DC | PRN

## 2016-11-23 MED ORDER — 0.9 % SODIUM CHLORIDE (POUR BTL) OPTIME
TOPICAL | Status: DC | PRN
  Administered 2016-11-23: 1000 mL

## 2016-11-23 MED ORDER — LACTATED RINGERS IV SOLN
INTRAVENOUS | Status: DC
  Administered 2016-11-23: 1000 mL via INTRAVENOUS
  Administered 2016-11-23 (×2): via INTRAVENOUS

## 2016-11-23 MED ORDER — HYDROCODONE-ACETAMINOPHEN 7.5-325 MG PO TABS: 1.0000 | ORAL_TABLET | ORAL | 0 refills | Status: DC | PRN

## 2016-11-23 MED ORDER — PHENYLEPHRINE HCL 10 MG/ML IJ SOLN
INTRAVENOUS | Status: DC | PRN
  Administered 2016-11-23: 25 ug/min via INTRAVENOUS

## 2016-11-23 MED ORDER — BUPIVACAINE HCL (PF) 0.75 % IJ SOLN
INTRAMUSCULAR | Status: DC | PRN
  Administered 2016-11-23: 15 mg via INTRATHECAL

## 2016-11-23 MED ORDER — METHOCARBAMOL 500 MG PO TABS
500.0000 mg | ORAL_TABLET | Freq: Four times a day (QID) | ORAL | Status: DC | PRN
  Administered 2016-11-23 – 2016-11-24 (×3): 500 mg via ORAL
  Filled 2016-11-23 (×3): qty 1

## 2016-11-23 MED ORDER — BISACODYL 10 MG RE SUPP: 10.0000 mg | Freq: Every day | RECTAL | Status: DC | PRN

## 2016-11-23 MED ORDER — POLYETHYLENE GLYCOL 3350 17 G PO PACK
17.0000 g | PACK | Freq: Two times a day (BID) | ORAL | Status: DC
  Administered 2016-11-23 – 2016-11-24 (×2): 17 g via ORAL
  Filled 2016-11-23 (×2): qty 1

## 2016-11-23 MED ORDER — CELECOXIB 200 MG PO CAPS
200.0000 mg | ORAL_CAPSULE | Freq: Two times a day (BID) | ORAL | Status: DC
  Administered 2016-11-23 – 2016-11-24 (×2): 200 mg via ORAL
  Filled 2016-11-23 (×2): qty 1

## 2016-11-23 MED ORDER — HYDROXYZINE HCL 50 MG PO TABS
50.0000 mg | ORAL_TABLET | Freq: Every day | ORAL | Status: DC
  Administered 2016-11-23: 22:00:00 50 mg via ORAL
  Filled 2016-11-23: qty 1

## 2016-11-23 MED ORDER — DOCUSATE SODIUM 100 MG PO CAPS: 100.0000 mg | ORAL_CAPSULE | Freq: Two times a day (BID) | ORAL | 0 refills | Status: DC

## 2016-11-23 MED ORDER — SODIUM CHLORIDE 0.9 % IV SOLN
INTRAVENOUS | Status: DC
  Administered 2016-11-23 – 2016-11-24 (×2): via INTRAVENOUS
  Filled 2016-11-23 (×5): qty 1000

## 2016-11-23 MED ORDER — MIDAZOLAM HCL 2 MG/2ML IJ SOLN
INTRAMUSCULAR | Status: AC
  Filled 2016-11-23: qty 2

## 2016-11-23 MED ORDER — HYDROMORPHONE HCL 1 MG/ML IJ SOLN: 0.2500 mg | INTRAMUSCULAR | Status: DC | PRN

## 2016-11-23 MED ORDER — CELECOXIB 200 MG PO CAPS: 200.0000 mg | ORAL_CAPSULE | Freq: Two times a day (BID) | ORAL | 0 refills | Status: DC

## 2016-11-23 MED ORDER — PROPOFOL 10 MG/ML IV BOLUS
INTRAVENOUS | Status: AC
  Filled 2016-11-23: qty 40

## 2016-11-23 MED ORDER — HYDROCODONE-ACETAMINOPHEN 7.5-325 MG PO TABS
1.0000 | ORAL_TABLET | ORAL | Status: DC
  Administered 2016-11-23: 2 via ORAL
  Administered 2016-11-23: 1 via ORAL
  Administered 2016-11-23: 2 via ORAL
  Administered 2016-11-23: 1 via ORAL
  Administered 2016-11-24 (×3): 2 via ORAL
  Filled 2016-11-23 (×2): qty 2
  Filled 2016-11-23: qty 1
  Filled 2016-11-23 (×2): qty 2
  Filled 2016-11-23: qty 1
  Filled 2016-11-23: qty 2

## 2016-11-23 MED ORDER — PHENOL 1.4 % MT LIQD
1.0000 | OROMUCOSAL | Status: DC | PRN
  Filled 2016-11-23: qty 177

## 2016-11-23 MED ORDER — METOCLOPRAMIDE HCL 5 MG/ML IJ SOLN: 5.0000 mg | Freq: Three times a day (TID) | INTRAMUSCULAR | Status: DC | PRN

## 2016-11-23 MED ORDER — ROPIVACAINE HCL 7.5 MG/ML IJ SOLN
INTRAMUSCULAR | Status: AC
  Filled 2016-11-23: qty 20

## 2016-11-23 MED ORDER — PANTOPRAZOLE SODIUM 40 MG PO TBEC
40.0000 mg | DELAYED_RELEASE_TABLET | Freq: Every day | ORAL | Status: DC
  Administered 2016-11-23: 22:00:00 40 mg via ORAL
  Filled 2016-11-23: qty 1

## 2016-11-23 MED ORDER — ONDANSETRON HCL 4 MG/2ML IJ SOLN
INTRAMUSCULAR | Status: DC | PRN
  Administered 2016-11-23: 4 mg via INTRAVENOUS

## 2016-11-23 MED ORDER — MIDAZOLAM HCL 2 MG/2ML IJ SOLN
1.0000 mg | INTRAMUSCULAR | Status: DC | PRN
  Administered 2016-11-23: 2 mg via INTRAVENOUS

## 2016-11-23 MED ORDER — ROPIVACAINE HCL 7.5 MG/ML IJ SOLN
INTRAMUSCULAR | Status: DC | PRN
  Administered 2016-11-23: 20 mL via PERINEURAL

## 2016-11-23 MED ORDER — METOPROLOL TARTRATE 12.5 MG HALF TABLET
12.5000 mg | ORAL_TABLET | Freq: Every day | ORAL | Status: DC
  Administered 2016-11-24: 10:00:00 12.5 mg via ORAL
  Filled 2016-11-23: qty 1

## 2016-11-23 MED ORDER — KETOROLAC TROMETHAMINE 30 MG/ML IJ SOLN
INTRAMUSCULAR | Status: DC | PRN
  Administered 2016-11-23: 30 mg

## 2016-11-23 MED ORDER — DIPHENHYDRAMINE HCL 25 MG PO CAPS: 25.0000 mg | ORAL_CAPSULE | Freq: Four times a day (QID) | ORAL | Status: DC | PRN

## 2016-11-23 MED ORDER — SODIUM CHLORIDE 0.9 % IJ SOLN
INTRAMUSCULAR | Status: AC
  Filled 2016-11-23: qty 50

## 2016-11-23 MED ORDER — STERILE WATER FOR IRRIGATION IR SOLN
Status: DC | PRN
  Administered 2016-11-23: 2000 mL

## 2016-11-23 MED ORDER — ONDANSETRON HCL 4 MG/2ML IJ SOLN: 4.0000 mg | Freq: Four times a day (QID) | INTRAMUSCULAR | Status: DC | PRN

## 2016-11-23 MED ORDER — PROPOFOL 500 MG/50ML IV EMUL
INTRAVENOUS | Status: DC | PRN
  Administered 2016-11-23: 75 ug/kg/min via INTRAVENOUS

## 2016-11-23 MED ORDER — ONDANSETRON HCL 4 MG/2ML IJ SOLN
INTRAMUSCULAR | Status: AC
  Filled 2016-11-23: qty 2

## 2016-11-23 SURGICAL SUPPLY — 44 items
BAG ZIPLOCK 12X15 (MISCELLANEOUS) ×3 IMPLANT
BANDAGE ACE 6X5 VEL STRL LF (GAUZE/BANDAGES/DRESSINGS) ×3 IMPLANT
BLADE SAW SGTL 13.0X1.19X90.0M (BLADE) ×3 IMPLANT
BOWL SMART MIX CTS (DISPOSABLE) ×3 IMPLANT
CAP KNEE TOTAL 3 SIGMA ×3 IMPLANT
CEMENT HV SMART SET (Cement) ×6 IMPLANT
CLOTH BEACON ORANGE TIMEOUT ST (SAFETY) ×3 IMPLANT
CUFF TOURN SGL QUICK 34 (TOURNIQUET CUFF) ×2
CUFF TRNQT CYL 34X4X40X1 (TOURNIQUET CUFF) ×1 IMPLANT
DECANTER SPIKE VIAL GLASS SM (MISCELLANEOUS) ×3 IMPLANT
DERMABOND ADVANCED (GAUZE/BANDAGES/DRESSINGS) ×2
DERMABOND ADVANCED .7 DNX12 (GAUZE/BANDAGES/DRESSINGS) ×1 IMPLANT
DRAPE U-SHAPE 47X51 STRL (DRAPES) ×3 IMPLANT
DRSG AQUACEL AG ADV 3.5X10 (GAUZE/BANDAGES/DRESSINGS) ×3 IMPLANT
DURAPREP 26ML APPLICATOR (WOUND CARE) ×6 IMPLANT
ELECT REM PT RETURN 9FT ADLT (ELECTROSURGICAL) ×3
ELECTRODE REM PT RTRN 9FT ADLT (ELECTROSURGICAL) ×1 IMPLANT
GLOVE BIOGEL PI IND STRL 7.5 (GLOVE) ×5 IMPLANT
GLOVE BIOGEL PI IND STRL 8.5 (GLOVE) ×1 IMPLANT
GLOVE BIOGEL PI INDICATOR 7.5 (GLOVE) ×10
GLOVE BIOGEL PI INDICATOR 8.5 (GLOVE) ×2
GLOVE ECLIPSE 8.0 STRL XLNG CF (GLOVE) ×6 IMPLANT
GLOVE ORTHO TXT STRL SZ7.5 (GLOVE) ×3 IMPLANT
GLOVE SURG SS PI 7.0 STRL IVOR (GLOVE) ×3 IMPLANT
GLOVE SURG SS PI 7.5 STRL IVOR (GLOVE) ×3 IMPLANT
GOWN STRL REUS W/ TWL XL LVL3 (GOWN DISPOSABLE) ×1 IMPLANT
GOWN STRL REUS W/TWL LRG LVL3 (GOWN DISPOSABLE) ×3 IMPLANT
GOWN STRL REUS W/TWL XL LVL3 (GOWN DISPOSABLE) ×8 IMPLANT
HANDPIECE INTERPULSE COAX TIP (DISPOSABLE) ×2
MANIFOLD NEPTUNE II (INSTRUMENTS) ×3 IMPLANT
PACK TOTAL KNEE CUSTOM (KITS) ×3 IMPLANT
POSITIONER SURGICAL ARM (MISCELLANEOUS) ×3 IMPLANT
SET HNDPC FAN SPRY TIP SCT (DISPOSABLE) ×1 IMPLANT
SET PAD KNEE POSITIONER (MISCELLANEOUS) ×3 IMPLANT
SUT MNCRL AB 4-0 PS2 18 (SUTURE) ×3 IMPLANT
SUT VIC AB 1 CT1 36 (SUTURE) ×3 IMPLANT
SUT VIC AB 2-0 CT1 27 (SUTURE) ×6
SUT VIC AB 2-0 CT1 TAPERPNT 27 (SUTURE) ×3 IMPLANT
SUT VLOC 180 0 24IN GS25 (SUTURE) ×3 IMPLANT
SYR 50ML LL SCALE MARK (SYRINGE) ×3 IMPLANT
TRAY FOLEY W/METER SILVER 16FR (SET/KITS/TRAYS/PACK) ×3 IMPLANT
TRAY REVISION SZ 4 (Knees) ×3 IMPLANT
WRAP KNEE MAXI GEL POST OP (GAUZE/BANDAGES/DRESSINGS) ×3 IMPLANT
YANKAUER SUCT BULB TIP 10FT TU (MISCELLANEOUS) ×3 IMPLANT

## 2016-11-23 NOTE — Evaluation (Signed)
Physical Therapy Evaluation Patient Details Name: William [name] MRN: [id number] DOB: 10/20/1950 Today's Date: 11/23/2016   History of Present Illness  L TKA  Clinical Impression  The  Patients buttocks remain numb so did not stand, mobilized to sitting edge of bed. Pt admitted with above diagnosis. Pt currently with functional limitations due to the deficits listed below (see PT Problem List).  Pt will benefit from skilled PT to increase their independence and safety with mobility to allow discharge to the venue listed below.       Follow Up Recommendations Outpatient PT    Equipment Recommendations  None recommended by PT    Recommendations for Other Services       Precautions / Restrictions Precautions Precautions: Knee;Fall Restrictions Weight Bearing Restrictions: No      Mobility  Bed Mobility Overal bed mobility: Needs Assistance Bed Mobility: Supine to Sit;Sit to Supine     Supine to sit: Min assist Sit to supine: Min assist   General bed mobility comments: assist with left leg  Transfers                 General transfer comment: NT due to numbness of buttocks  Ambulation/Gait                Stairs            Wheelchair Mobility    Modified Rankin (Stroke Patients Only)       Balance                                             Pertinent Vitals/Pain Pain Assessment: 0-10 Pain Score: 5  Pain Location: l eft thigh Pain Descriptors / Indicators: Aching;Sore Pain Intervention(s): Repositioned;Premedicated before session;Monitored during session    Home Living Family/patient expects to be discharged to:: Private residence Living Arrangements: Spouse/significant other Available Help at Discharge: Family Type of Home: House Home Access: Stairs to enter Entrance Stairs-Rails: Right;Left;Can reach both Entrance Stairs-Number of Steps: 3 Home Layout: One level Home Equipment: Environmental consultant - 2 wheels;Cane - single  point      Prior Function Level of Independence: Independent with assistive device(s)               Hand Dominance        Extremity/Trunk Assessment   Upper Extremity Assessment Upper Extremity Assessment: Overall WFL for tasks assessed    Lower Extremity Assessment Lower Extremity Assessment: LLE deficits/detail LLE Deficits / Details: SLR with lag, buttocks numb       Communication   Communication: No difficulties  Cognition Arousal/Alertness: Awake/alert Behavior During Therapy: WFL for tasks assessed/performed Overall Cognitive Status: Within Functional Limits for tasks assessed                      General Comments      Exercises     Assessment/Plan    PT Assessment Patient needs continued PT services  PT Problem List Decreased strength;Decreased range of motion;Decreased activity tolerance;Decreased mobility;Decreased knowledge of precautions;Decreased safety awareness;Decreased knowledge of use of DME;Pain          PT Treatment Interventions DME instruction;Gait training;Stair training;Functional mobility training;Therapeutic activities;Therapeutic exercise;Patient/family education    PT Goals (Current goals can be found in the Care Plan section)  Acute Rehab PT Goals Patient Stated Goal: to walk without a limp PT Goal Formulation: With patient/family Time  For Goal Achievement: 11/26/16 Potential to Achieve Goals: Good    Frequency 7X/week   Barriers to discharge        Co-evaluation               End of Session   Activity Tolerance: Patient tolerated treatment well Patient left: in bed;with call bell/phone within reach;with bed alarm set;with family/visitor present Nurse Communication: Mobility status    Functional Assessment Tool Used: clinical judgement Functional Limitation: Mobility: Walking and moving around Mobility: Walking and Moving Around Current Status ([id number]([id number]): At least 60 percent but less than 80 percent  impaired, limited or restricted Mobility: Walking and Moving Around Goal Status [phone]([id number]): At least 1 percent but less than 20 percent impaired, limited or restricted    Time: 1609-1630 PT Time Calculation (min) (ACUTE ONLY): 21 min   Charges:         PT G Codes:   PT G-Codes **NOT FOR INPATIENT CLASS** Functional Assessment Tool Used: clinical judgement Functional Limitation: Mobility: Walking and moving around Mobility: Walking and Moving Around Current Status (U9811(G8978): At least 60 percent but less than 80 percent impaired, limited or restricted Mobility: Walking and Moving Around Goal Status [phone]([phone]): At least 1 percent but less than 20 percent impaired, limited or restricted    Rada HayHill, Jacky Hartung Elizabeth 11/23/2016, 4:40 PM

## 2016-11-23 NOTE — Anesthesia Procedure Notes (Signed)
Spinal  Patient location during procedure: OR Start time: 11/23/2016 9:43 AM End time: 11/23/2016 9:45 AM Staffing Resident/CRNA: Harle Stanford R Performed: resident/CRNA  Preanesthetic Checklist Completed: patient identified, site marked, surgical consent, pre-op evaluation, timeout performed, IV checked, risks and benefits discussed and monitors and equipment checked Spinal Block Patient position: sitting Prep: Betadine Patient monitoring: heart rate, cardiac monitor, continuous pulse ox and blood pressure Approach: midline Location: L3-4 Injection technique: single-shot Needle Needle type: Pencan  Needle gauge: 24 G Needle length: 10 cm Needle insertion depth: 7 cm Assessment Sensory level: T6 Additional Notes Timeout performed. SAB kit date checked. SAB without difficulty.

## 2016-11-23 NOTE — Anesthesia Procedure Notes (Signed)
Anesthesia Regional Block:  Adductor canal block  Pre-Anesthetic Checklist: ,, timeout performed, Correct Patient, Correct Site, Correct Laterality, Correct Procedure, Correct Position, site marked, Risks and benefits discussed, pre-op evaluation,  At surgeon's request and post-op pain management  Laterality: Left  Prep: Maximum Sterile Barrier Precautions used, chloraprep       Needles:  Injection technique: Single-shot  Needle Type: Echogenic Stimulator Needle     Needle Length: 9cm 9 cm Needle Gauge: 21 and 21 G    Additional Needles:  Procedures: ultrasound guided (picture in chart) Adductor canal block Narrative:  Start time: 11/23/2016 8:45 AM End time: 11/23/2016 8:55 AM Injection made incrementally with aspirations every 5 mL. Anesthesiologist: Gaynelle AduFITZGERALD, Tammera Engert  Additional Notes: 2% Lidocaine skin wheel.

## 2016-11-23 NOTE — Transfer of Care (Signed)
Immediate Anesthesia Transfer of Care Note  Patient: William [name]  Procedure(s) Performed: Procedure(s) with comments: LEFT TOTAL KNEE ARTHROPLASTY (Left) - Adductor Block  Patient Location: PACU  Anesthesia Type:Regional and Spinal  Level of Consciousness: sedated  Airway & Oxygen Therapy: Patient Spontanous Breathing and Patient connected to face mask oxygen  Post-op Assessment: Report given to RN and Post -op Vital signs reviewed and stable  Post vital signs: Reviewed and stable  Last Vitals:  Vitals:   11/23/16 0915 11/23/16 0920  BP: 121/77 119/64  Pulse: 81 76  Resp: 18 17  Temp:      Last Pain:  Vitals:   11/23/16 0743  TempSrc: Oral  PainSc:       Patients Stated Pain Goal: 3 (11/23/16 0735)  Complications: No apparent anesthesia complications

## 2016-11-23 NOTE — Interval H&P Note (Signed)
History and Physical Interval Note:  11/23/2016 8:44 AM  William [name]  has presented today for surgery, with the diagnosis of left knee osteoarthritis  The various methods of treatment have been discussed with the patient and family. After consideration of risks, benefits and other options for treatment, the patient has consented to  Procedure(s): LEFT TOTAL KNEE ARTHROPLASTY (Left) as a surgical intervention .  The patient's history has been reviewed, patient examined, no change in status, stable for surgery.  I have reviewed the patient's chart and labs.  Questions were answered to the patient's satisfaction.     Shelda PalLIN,Carrol Hougland D

## 2016-11-23 NOTE — Anesthesia Postprocedure Evaluation (Signed)
Anesthesia Post Note  Patient: William [name]  Procedure(s) Performed: Procedure(s) (LRB): LEFT TOTAL KNEE ARTHROPLASTY (Left)  Patient location during evaluation: PACU Anesthesia Type: Spinal and Regional Level of consciousness: awake and alert Pain management: pain level controlled Vital Signs Assessment: post-procedure vital signs reviewed and stable Respiratory status: spontaneous breathing and respiratory function stable Cardiovascular status: blood pressure returned to baseline and stable Postop Assessment: spinal receding Anesthetic complications: no       Last Vitals:  Vitals:   11/23/16 1300 11/23/16 1320  BP: 115/72 106/66  Pulse: 84 75  Resp: 16 15  Temp:  36.4 C    Last Pain:  Vitals:   11/23/16 1300  TempSrc:   PainSc: 0-No pain                 Marilene Vath,W. EDMOND

## 2016-11-23 NOTE — Op Note (Signed)
NAME:  William [name]                      MEDICAL RECORD NO.:  [id number]                             FACILITY:  [facility]      PHYSICIAN:  Madlyn Frankel. Charlann Boxer, M.D.  DATE OF BIRTH:  01-24-1951      DATE OF PROCEDURE:  11/23/2016                                     OPERATIVE REPORT         PREOPERATIVE DIAGNOSIS:  Left knee osteoarthritis.      POSTOPERATIVE DIAGNOSIS:  Left knee osteoarthritis.      FINDINGS:  The patient was noted to have complete loss of cartilage and   bone-on-bone arthritis with associated osteophytes in the medial and patellofemoal compartments of   the knee with a significant synovitis and associated effusion.      PROCEDURE:  Left total knee replacement.      COMPONENTS USED:  [employer] rotating platform posterior stabilized knee   system, a size 5 femur, size 4 MBT tibial tray, size 15 mm PS insert, and 41 patellar   button.      SURGEON:  Madlyn Frankel. Charlann Boxer, M.D.      ASSISTANT:  Lanney Gins, PA-C.      ANESTHESIA:  Regional and Spinal.      SPECIMENS:  None.      COMPLICATION:  None.      DRAINS:  None.  EBL: <200cc      TOURNIQUET TIME:   Total Tourniquet Time Documented: Thigh (Left) - 35 minutes Total: Thigh (Left) - 35 minutes  .      The patient was stable to the recovery room.      INDICATION FOR PROCEDURE:  William [name] is a 66 y.o. male patient of   mine.  The patient had been seen, evaluated, and treated conservatively in the   office with medication, activity modification, and injections.  The patient had   radiographic changes of bone-on-bone arthritis with endplate sclerosis and osteophytes noted.      The patient failed conservative measures including medication, injections, and activity modification, and at this point was ready for more definitive measures.   Based on the radiographic changes and failed conservative measures, the patient   decided to proceed with total knee replacement.  Risks of infection,   DVT, component  failure, need for revision surgery, postop course, and   expectations were all   discussed and reviewed.  Consent was obtained for benefit of pain   relief.      PROCEDURE IN DETAIL:  The patient was brought to the operative theater.   Once adequate anesthesia, preoperative antibiotics, 3 gm of Ancef, 1 gm of Tranexamic Acid, and 10 mg of Decadron administered, the patient was positioned supine with the left thigh tourniquet placed.  The  left lower extremity was prepped and draped in sterile fashion.  A time-   out was performed identifying the patient, planned procedure, and   extremity.      The left lower extremity was placed in the [facility] leg holder.  The leg was   exsanguinated, tourniquet elevated to 250 mmHg.  A midline incision was  made followed by median parapatellar arthrotomy.  Following initial   exposure, attention was first directed to the patella.  Precut   measurement was noted to be 26 mm.  I resected down to 14 mm and used a   41 patellar button to restore patellar height as well as cover the cut   surface.      The lug holes were drilled and a metal shim was placed to protect the   patella from retractors and saw blades.      At this point, attention was now directed to the femur.  The femoral   canal was opened with a drill, irrigated to try to prevent fat emboli.  An   intramedullary rod was passed at 5 degrees valgus, 10 mm of bone was   resected off the distal femur.  Following this resection, the tibia was   subluxated anteriorly.  Using the extramedullary guide, 2 mm of bone was resected off   the proximal medial tibia.  We confirmed the gap would be   stable medially and laterally with a 10 mm insert as well as confirmed   the cut was perpendicular in the coronal plane, checking with an alignment rod.      Once this was done, I sized the femur to be a size 5 in the anterior-   posterior dimension, chose a standard component based on medial and   lateral  dimension.  The size 5 rotation block was then pinned in   position anterior referenced using the C-clamp to set rotation.  The   anterior, posterior, and  chamfer cuts were made without difficulty nor   notching making certain that I was along the anterior cortex to help   with flexion gap stability.      The final box cut was made off the lateral aspect of distal femur.      At this point, the tibia was sized to be a size 4, the size 4 tray was   then pinned in position through the medial third of the tubercle,   drilled for the MBT tray, and keel punched.  Trial reduction was now carried with a 5 femur,  4 MBT revision trial tibia, a 12.5 then 15 PS insert, and the 41 patella botton.  The knee was brought to   extension, full extension with good flexion stability with the patella   tracking through the trochlea without application of pressure.  Given   all these findings, the trial components removed.  Final components were   opened and cement was mixed.  The knee was irrigated with normal saline   solution and pulse lavage.  The synovial lining was   then injected with 20 cc 0.25% Marcaine without epinephrine and 1 cc of Toradol plus 30 cc of NS for a total of 61 cc.      The knee was irrigated.  Final implants were then cemented onto clean and   dried cut surfaces of bone with the knee brought to extension with a 15 mm PS trial insert.      Once the cement had fully cured, the excess cement was removed   throughout the knee.  I confirmed I was satisfied with the range of   motion and stability, and the final 15 mm PS insert was chosen.  It was   placed into the knee.      The tourniquet had been let down at 35 minutes.  No significant   hemostasis  required.  The   extensor mechanism was then reapproximated using #1 Vicryl with the knee   in flexion.  The   remaining wound was closed with 2-0 Vicryl and running 4-0 Monocryl.   The knee was cleaned, dried, dressed sterilely using  Dermabond and   Aquacel dressing.  The patient was then   brought to recovery room in stable condition, tolerating the procedure   well.   Please note that Physician Assistant, Lanney Gins, PA-C, was present for the entirety of the case, and was utilized for pre-operative positioning, peri-operative retractor management, general facilitation of the procedure.  He was also utilized for primary wound closure at the end of the case.              Madlyn Frankel Charlann Boxer, M.D.    11/23/2016 11:18 AM

## 2016-11-23 NOTE — Progress Notes (Signed)
Assisted Dr. Edmond Fitzgerald with left, ultrasound guided, adductor canal block. Side rails up, monitors on throughout procedure. See vital signs in flow sheet. Tolerated Procedure well. 

## 2016-11-23 NOTE — Discharge Instructions (Signed)

## 2016-11-24 DIAGNOSIS — M1712 Unilateral primary osteoarthritis, left knee: Secondary | ICD-10-CM | POA: Diagnosis not present

## 2016-11-24 LAB — CBC
HEMATOCRIT: 40.4 % (ref 39.0–52.0)
HEMOGLOBIN: 13.5 g/dL (ref 13.0–17.0)
MCH: 30.6 pg (ref 26.0–34.0)
MCHC: 33.4 g/dL (ref 30.0–36.0)
MCV: 91.6 fL (ref 78.0–100.0)
Platelets: 184 10*3/uL (ref 150–400)
RBC: 4.41 MIL/uL (ref 4.22–5.81)
RDW: 14.5 % (ref 11.5–15.5)
WBC: 13.6 10*3/uL — ABNORMAL HIGH (ref 4.0–10.5)

## 2016-11-24 LAB — BASIC METABOLIC PANEL
ANION GAP: 7 (ref 5–15)
BUN: 18 mg/dL (ref 6–20)
CHLORIDE: 106 mmol/L (ref 101–111)
CO2: 23 mmol/L (ref 22–32)
Calcium: 8.2 mg/dL — ABNORMAL LOW (ref 8.9–10.3)
Creatinine, Ser: 1.02 mg/dL (ref 0.61–1.24)
GFR calc non Af Amer: >60 mL/min (ref >60)
Glucose, Bld: 149 mg/dL — ABNORMAL HIGH (ref 65–99)
POTASSIUM: 4.3 mmol/L (ref 3.5–5.1)
SODIUM: 136 mmol/L (ref 135–145)

## 2016-11-24 NOTE — Progress Notes (Signed)
   11/24/16 1257  ADL  Tub/ Shower Transfer Walk-in shower;Min guard;Ambulation  General ADL Comments cues for sequence. Wife present and observed  Restrictions  Other Position/Activity Restrictions WBAT  Transfers  Sit to Stand Min guard  General transfer comment cues for UE/LE placement  OT - End of Session  Activity Tolerance Patient tolerated treatment well  Patient left (to w/c for d/c)  OT Assessment/Plan  Follow Up Recommendations Supervision/Assistance - 24 hour  OT Equipment None recommended by OT  OT Goal Progression  Progress towards OT goals Goals met/education completed, patient discharged from OT  OT Time Calculation  OT Start Time (ACUTE ONLY) 1236  OT Stop Time (ACUTE ONLY) 1248  OT Time Calculation (min) 12 min  OT G-codes **NOT FOR INPATIENT CLASS**  Self Care Discharge Status ([id number]) CI  OT General Charges  $OT Visit 1 Procedure  OT Treatments  $Self Care/Home Management  8-22 mins  Lesle Chris, OTR/L [phone] 11/24/2016

## 2016-11-24 NOTE — Progress Notes (Signed)
     Subjective: 1 Day Post-Op Procedure(s) (LRB): LEFT TOTAL KNEE ARTHROPLASTY (Left)   Patient reports pain as mild, pain controlled. No events throughout the night. Can already feel the significant leg length difference after fixing the significant varus deformity.  Objective:   VITALS:   Vitals:   11/24/16 0621 11/24/16 0800  BP: 104/63 (!) 101/52  Pulse: 82 75  Resp:    Temp:      Dorsiflexion/Plantar flexion intact Incision: dressing C/D/I No cellulitis present Compartment soft  LABS  Recent Labs  11/24/16 0428  HGB 13.5  HCT 40.4  WBC 13.6*  PLT 184     Recent Labs  11/24/16 0428  NA 136  K 4.3  BUN 18  CREATININE 1.02  GLUCOSE 149*     Assessment/Plan: 1 Day Post-Op Procedure(s) (LRB): LEFT TOTAL KNEE ARTHROPLASTY (Left) Foley cath d/c'ed Advance diet Up with therapy D/C IV fluids Discharge home [facility] Follow up with OLIN,Neilah Fulwider D in 2 weeks.  Contact information:  [facility] [address], [address] [place] [id number] [phone]    Morbid Obesity (BMI >40)  Estimated body mass index is 41.94 kg/m as calculated from the following:   Height as of this encounter: 5\' 9"  (1.753 m).   Weight as of this encounter: 128.8 kg (284 lb). Patient also counseled that weight may inhibit the healing process Patient counseled that losing weight will help with future health issues       Anastasio AuerbachMatthew S. Elberta Lachapelle   PAC  11/24/2016, 8:49 AM

## 2016-11-24 NOTE — Progress Notes (Signed)
Physical Therapy Treatment Patient Details Name: William [name] MRN: [id number] DOB: 03/27/1951 Today's Date: 11/24/2016    History of Present Illness L TKA    PT Comments    POD # 1  Spouse present for "hands on" instruction on all activity mentioned below.  Pt given HEP and performed with instruction on proper tech and freq as well as use of ICE.  Pt will need another PT session to address stairs before D/C to home today.   Follow Up Recommendations  Outpatient PT     Equipment Recommendations  None recommended by PT    Recommendations for Other Services       Precautions / Restrictions Precautions Precautions: Knee;Fall Restrictions Weight Bearing Restrictions: No Other Position/Activity Restrictions: WBAT    Mobility  Bed Mobility         Supine to sit: Min assist     General bed mobility comments: OOB in recliner  Transfers Overall transfer level: Needs assistance Equipment used: Rolling walker (2 wheeled) Transfers: Sit to/from Stand Sit to Stand: Min assist         General transfer comment: light assistance to stand and steady. Cues for UE/LE placement  Ambulation/Gait Ambulation/Gait assistance: Supervision Ambulation Distance (Feet): 47 Feet Assistive device: Rolling walker (2 wheeled) Gait Pattern/deviations: Step-to pattern;Step-through pattern Gait velocity: decreased   General Gait Details: 25% VC's on proper walker to self distance and safety with turns   Stairs            Wheelchair Mobility    Modified Rankin (Stroke Patients Only)       Balance                                    Cognition Arousal/Alertness: Awake/alert Behavior During Therapy: WFL for tasks assessed/performed Overall Cognitive Status: Within Functional Limits for tasks assessed                      Exercises   Total Knee Replacement TE's 10 reps B LE ankle pumps 10 reps towel squeezes 10 reps knee presses 10 reps heel  slides  10 reps SAQ's 10 reps SLR's 10 reps ABD Followed by ICE     General Comments        Pertinent Vitals/Pain Pain Assessment: 0-10 Pain Score: 4  Pain Location: L knee Pain Descriptors / Indicators: Aching;Sore;Tender Pain Intervention(s): Monitored during session;Repositioned;Ice applied    Home Living Family/patient expects to be discharged to:: Private residence Living Arrangements: Spouse/significant other Available Help at Discharge: Family         Home Equipment: Dan HumphreysWalker - 2 wheels;Cane - single point;Shower seat Additional Comments: has a grab bar by toilet    Prior Function Level of Independence: Independent with assistive device(s)          PT Goals (current goals can now be found in the care plan section) Acute Rehab PT Goals Patient Stated Goal:  return in 4 weeks to have other knee done Progress towards PT goals: Progressing toward goals    Frequency    7X/week      PT Plan Current plan remains appropriate    Co-evaluation             End of Session Equipment Utilized During Treatment: Gait belt Activity Tolerance: Patient tolerated treatment well Patient left: in chair;with family/visitor present     Time: 1015-1046 PT Time Calculation (min) (ACUTE ONLY): 31  min  Charges:  $Gait Training: 8-22 mins $Therapeutic Exercise: 8-22 mins                    G Codes:      Rica Koyanagi  PTA [facility]  Acute  Rehab Pager      [phone]

## 2016-11-24 NOTE — Progress Notes (Signed)
Physical Therapy Treatment Patient Details Name: Johnney OuJoseph Furniss MRN: [id number] DOB: 03/26/1951 Today's Date: 11/24/2016    History of Present Illness L TKA    PT Comments    POD # 1 Second session needed to address stair training with spouse.    Follow Up Recommendations  Outpatient PT     Equipment Recommendations  None recommended by PT    Recommendations for Other Services       Precautions / Restrictions Precautions Precautions: Knee;Fall Restrictions Weight Bearing Restrictions: No Other Position/Activity Restrictions: WBAT    Mobility  Bed Mobility         Supine to sit: Min assist     General bed mobility comments: OOB in recliner  Transfers Overall transfer level: Needs assistance Equipment used: Rolling walker (2 wheeled) Transfers: Sit to/from Stand Sit to Stand: Min guard         General transfer comment: cues for UE/LE placement  Ambulation/Gait Ambulation/Gait assistance: Supervision Ambulation Distance (Feet): 47 Feet Assistive device: Rolling walker (2 wheeled) Gait Pattern/deviations: Step-to pattern;Step-through pattern Gait velocity: decreased   General Gait Details: 25% VC's on proper walker to self distance and safety with turns   Stairs Stairs: Yes   Stair Management: Two rails;Step to pattern;Forwards Number of Stairs: 2 General stair comments: practiced with spouse and 25% VC'a on proper sequencing and safety.  Wheelchair Mobility    Modified Rankin (Stroke Patients Only)       Balance                                    Cognition Arousal/Alertness: Awake/alert Behavior During Therapy: WFL for tasks assessed/performed Overall Cognitive Status: Within Functional Limits for tasks assessed                      Exercises      General Comments        Pertinent Vitals/Pain Pain Assessment: 0-10 Pain Score: 4  Pain Location: L knee Pain Descriptors / Indicators: Aching;Tightness Pain  Intervention(s): Limited activity within patient's tolerance;Monitored during session;Premedicated before session;Repositioned    Home Living Family/patient expects to be discharged to:: Private residence Living Arrangements: Spouse/significant other Available Help at Discharge: Family         Home Equipment: Dan HumphreysWalker - 2 wheels;Cane - single point;Shower seat Additional Comments: has a grab bar by toilet    Prior Function Level of Independence: Independent with assistive device(s)          PT Goals (current goals can now be found in the care plan section) Acute Rehab PT Goals Patient Stated Goal:  return in 4 weeks to have other knee done Progress towards PT goals: Progressing toward goals    Frequency    7X/week      PT Plan Current plan remains appropriate    Co-evaluation             End of Session Equipment Utilized During Treatment: Gait belt Activity Tolerance: Patient tolerated treatment well Patient left: in chair;with family/visitor present     Time: [id number] PT Time Calculation (min) (ACUTE ONLY): 15 min  Charges:  $Gait Training: 8-22 mins                     G Codes:      Felecia ShellingLori Yenty Bloch  PTA [facility]  Acute  Rehab Pager      [phone]

## 2016-11-24 NOTE — Care Management Note (Signed)
Case Management Note  Patient Details  Name: Keondrick Dilks MRN: [id number] Date of Birth: 10-28-1950  Subjective/Objective:                    Action/Plan:   Expected Discharge Date:  11/24/16               Expected Discharge Plan:  Home/Self Care  In-House Referral:     Discharge planning Services  CM Consult  Post Acute Care Choice:  NA Choice offered to:  Patient  DME Arranged:  N/A DME Agency:  NA  HH Arranged:  NA HH Agency:  NA  Status of Service:  Completed, signed off  If discussed at [place] of Stay Meetings, dates discussed:    Additional Comments: CM met with pt to confirm plan is for outpt PT; pt confirms.  Pt states he has both a rolling walker and a 3n1 at home. No other CM needs were communicated. Dellie Catholic, RN 11/24/2016, 11:26 AM

## 2016-11-24 NOTE — Evaluation (Addendum)
Occupational Therapy Evaluation Patient Details Name: William [name] MRN: [id number] DOB: 11/01/1950 Today's Date: 11/24/2016    History of Present Illness L TKA   Clinical Impression   This 66 year old man was admitted for the above sx.  He will benefit from one more OT session to educate on and practice shower transfer.    Follow Up Recommendations  Supervision/Assistance - 24 hour    Equipment Recommendations  None recommended by OT    Recommendations for Other Services       Precautions / Restrictions Precautions Precautions: Knee;Fall Restrictions Weight Bearing Restrictions: No      Mobility Bed Mobility         Supine to sit: Min assist     General bed mobility comments: assist with left leg  Transfers Overall transfer level: Needs assistance Equipment used: Rolling walker (2 wheeled) Transfers: Sit to/from Stand Sit to Stand: Min assist         General transfer comment: light assistance to stand and steady. Cues for UE/LE placement    Balance                                            ADL Overall ADL's : Needs assistance/impaired     Grooming: Wash/dry face;Standing;Min guard                   Toilet Transfer: Min guard;Comfort height toilet;Grab bars;RW;Ambulation             General ADL Comments: This was pt's first time out of bed.  Ambulated to bathroom with minn guard and cues for sequence and step length.  Wife will assist with adls as needed.  Based on clinical judgment, pt can perform UB adls with set up and will need mod A for LB adls.  Will need to practice shower transfer prior to d/c home.  Pt only has a 2" ledge.  Reviewed precautions and safety     Vision     Perception     Praxis      Pertinent Vitals/Pain Pain Score: 5  Pain Location: L knee Pain Descriptors / Indicators: Aching;Sore Pain Intervention(s): Limited activity within patient's tolerance;Monitored during  session;Premedicated before session;Repositioned;Ice applied     Hand Dominance     Extremity/Trunk Assessment Upper Extremity Assessment Upper Extremity Assessment: Overall WFL for tasks assessed           Communication Communication Communication: No difficulties   Cognition Arousal/Alertness: Awake/alert Behavior During Therapy: WFL for tasks assessed/performed Overall Cognitive Status: Within Functional Limits for tasks assessed (very talkative and distracts himself at times)                     General Comments       Exercises       Shoulder Instructions      Home Living Family/patient expects to be discharged to:: Private residence Living Arrangements: Spouse/significant other Available Help at Discharge: Family               Bathroom Shower/Tub: Walk-in shower   Bathroom Toilet: Handicapped height     Home Equipment: Environmental consultantWalker - 2 wheels;Cane - single point;Shower seat   Additional Comments: has a grab bar by toilet      Prior Functioning/Environment Level of Independence: Independent with assistive device(s)  OT Problem List: Decreased activity tolerance;Decreased knowledge of use of DME or AE;Pain   OT Treatment/Interventions: Self-care/ADL training;DME and/or AE instruction;Patient/family education    OT Goals(Current goals can be found in the care plan section) Acute Rehab OT Goals Patient Stated Goal:  return in 4 weeks to have other knee done OT Goal Formulation: With patient Time For Goal Achievement: 11/27/16 Potential to Achieve Goals: Good ADL Goals Pt Will Perform Tub/Shower Transfer: with min guard assist;Shower transfer;shower seat;ambulating  OT Frequency: Min 1X/week   Barriers to D/C:            Co-evaluation              End of Session    Activity Tolerance: Patient tolerated treatment well Patient left: in chair;with call bell/phone within reach;with family/visitor present   Time:  1610-9604 OT Time Calculation (min): 31 min Charges:  OT General Charges $OT Visit: 1 Procedure OT Evaluation $OT Eval Low Complexity: 1 Procedure OT Treatments $Self Care/Home Management : 8-22 mins G-Codes: OT G-codes **NOT FOR INPATIENT CLASS** Functional Assessment Tool Used: clinical judgment and observation Functional Limitation: Self care Self Care Current Status (V4098): At least 40 percent but less than 60 percent impaired, limited or restricted Self Care Goal Status (J1914): At least 40 percent but less than 60 percent impaired, limited or restricted  [facility] 11/24/2016, 10:49 AM  Marica Otter, OTR/L [phone] 11/24/2016

## 2016-11-26 NOTE — Discharge Summary (Signed)
Physician Discharge Summary  Patient ID: William OuJoseph Foushee MRN: [id number] DOB/AGE: 64/02/1951 [age] y.o.  Admit date: 11/23/2016 Discharge date: 11/24/2016   Procedures:  Procedure(s) (LRB): LEFT TOTAL KNEE ARTHROPLASTY (Left)  Attending Physician:  Dr. Durene RomansMatthew Olin   Admission Diagnoses:   Left knee primary OA / pain  Discharge Diagnoses:  Principal Problem:   S/P left TKA Active Problems:   Morbid obesity (HCC)  Past Medical History:  Diagnosis Date  . Anxiety   . Arthritis   . COPD (chronic obstructive pulmonary disease) (HCC)   . Depression   . Diverticulitis   . Essential hypertension 11/20/2014  . GERD (gastroesophageal reflux disease)   . H/O cardiovascular stress test    06/20/14 Nuclear stress test [facility]): Rest and stress images are WNL. No significant reversible ischemia or fixed scar. Gated LVEF 55%. NL LV wall motion    . Headache    hx of migraines  . History of kidney stones   . History of pneumonia   . History of sepsis January 2016  . Hypertension   . Hypothyroidism 11/20/2014  . Pneumonia    hx of  . Protein-calorie malnutrition (HCC) 11/20/2014  . Renal disorder    kidney stones  . Shortness of breath dyspnea   . Sleep apnea    nasal 13.5 settings  . Thyroid disease   . Wears glasses     HPI:    William [name], 66 y.o. male, has a history of pain and functional disability in the left knee due to arthritis and has failed non-surgical conservative treatments for greater than 12 weeks to include NSAID's and/or analgesics, corticosteriod injections, viscosupplementation injections, use of assistive devices and activity modification.  Onset of symptoms was gradual, starting >10 years ago with gradually worsening course since that time. The patient noted no past surgery on the left knee(s).  Patient currently rates pain in the left knee(s) at 8 out of 10 with activity. Patient has night pain, worsening of pain with activity and weight bearing,  pain that interferes with activities of daily living, pain with passive range of motion, crepitus and joint swelling.  Patient has evidence of periarticular osteophytes and joint space narrowing by imaging studies.  There is no active infection.  Risks, benefits and expectations were discussed with the patient.  Risks including but not limited to the risk of anesthesia, blood clots, nerve damage, blood vessel damage, failure of the prosthesis, infection and up to and including death.  Patient understand the risks, benefits and expectations and wishes to proceed with surgery.   PCP: Pcp Not In System   Discharged Condition: good  Hospital Course:  Patient underwent the above stated procedure on 11/23/2016. Patient tolerated the procedure well and brought to the recovery room in good condition and subsequently to the floor.  POD #1 BP: 101/52 ; Pulse: 75  Patient reports pain as mild, pain controlled. No events throughout the night. Can already feel the significant leg length difference after fixing the significant varus deformity. Dorsiflexion/plantar flexion intact, incision: dressing C/D/I, no cellulitis present and compartment soft.   LABS  Basename    HGB     13.5  HCT     40.4    Discharge Exam: General appearance: alert, cooperative and no distress Extremities: Homans sign is negative, no sign of DVT, no edema, redness or tenderness in the calves or thighs and no ulcers, gangrene or trophic changes  Disposition: Home with follow up in 2 weeks  Follow-up Information    Shelda Pal, MD. Schedule an appointment as soon as possible for a visit in 2 week(s).   Specialty:  Orthopedic Surgery Contact information: [address] [place] [address] [phone]           Discharge Instructions    Call MD / Call 911    Complete by:  As directed    If you experience chest pain or shortness of breath, CALL 911 and be transported to the hospital emergency room.   If you develope a fever above 101 F, pus (white drainage) or increased drainage or redness at the wound, or calf pain, call your surgeon's office.   Change dressing    Complete by:  As directed    Maintain surgical dressing until follow up in the clinic. If the edges start to pull up, may reinforce with tape. If the dressing is no longer working, may remove and cover with gauze and tape, but must keep the area dry and clean.  Call with any questions or concerns.   Constipation Prevention    Complete by:  As directed    Drink plenty of fluids.  Prune juice may be helpful.  You may use a stool softener, such as Colace (over the counter) 100 mg twice a day.  Use MiraLax (over the counter) for constipation as needed.   Diet - low sodium heart healthy    Complete by:  As directed    Discharge instructions    Complete by:  As directed    Maintain surgical dressing until follow up in the clinic. If the edges start to pull up, may reinforce with tape. If the dressing is no longer working, may remove and cover with gauze and tape, but must keep the area dry and clean.  Follow up in 2 weeks at [facility]. Call with any questions or concerns.   Increase activity slowly as tolerated    Complete by:  As directed    Weight bearing as tolerated with assist device (walker, cane, etc) as directed, use it as long as suggested by your surgeon or therapist, typically at least 4-6 weeks.   TED hose    Complete by:  As directed    Use stockings (TED hose) for 2 weeks on both leg(s).  You may remove them at night for sleeping.      Allergies as of 11/24/2016      Reactions   Aloprim [allopurinol] Other (See Comments)   "MUSCLE SEIZED UP"   Simvastatin Other (See Comments)   Muscle aches.   Penicillins Rash   Has patient had a PCN reaction causing immediate rash, facial/tongue/throat swelling, SOB or lightheadedness with hypotension: No Has patient had a PCN reaction causing severe rash involving  mucus membranes or skin necrosis: No Has patient had a PCN reaction that required hospitalization No Has patient had a PCN reaction occurring within the last 10 years: Yes If all of the above answers are "NO", then may proceed with Cephalosporin use.      Medication List    STOP taking these medications   HYDROcodone-acetaminophen 5-325 MG tablet Commonly known as:  NORCO/VICODIN Replaced by:  HYDROcodone-acetaminophen 7.5-325 MG tablet   meloxicam 7.5 MG tablet Commonly known as:  MOBIC     TAKE these medications   aspirin 81 MG chewable tablet Chew 1 tablet (81 mg total) by mouth 2 (two) times daily. Take for 4 weeks, then resume regular dose.   celecoxib 200 MG  capsule Commonly known as:  CELEBREX Take 1 capsule (200 mg total) by mouth 2 (two) times daily.   cholecalciferol 1000 units tablet Commonly known as:  VITAMIN D Take 1,000 Units by mouth daily.   docusate sodium 100 MG capsule Commonly known as:  COLACE Take 1 capsule (100 mg total) by mouth 2 (two) times daily.   ferrous sulfate 325 (65 FE) MG tablet Commonly known as:  FERROUSUL Take 1 tablet (325 mg total) by mouth 3 (three) times daily with meals.   FISH OIL CONCENTRATE 300 MG Caps Take 300 mg by mouth daily.   HYDROcodone-acetaminophen 7.5-325 MG tablet Commonly known as:  NORCO Take 1-2 tablets by mouth every 4 (four) hours as needed for moderate pain or severe pain. Replaces:  HYDROcodone-acetaminophen 5-325 MG tablet   hydrOXYzine 50 MG capsule Commonly known as:  VISTARIL Take 50 mg by mouth at bedtime.   lisinopril 40 MG tablet Commonly known as:  PRINIVIL,ZESTRIL Take 40 mg by mouth daily.   metoprolol tartrate 25 MG tablet Commonly known as:  LOPRESSOR Take 1 tablet (25 mg total) by mouth 2 (two) times daily. What changed:  how much to take  when to take this   OVER THE COUNTER MEDICATION Take 1 capsule by mouth daily. Turmeric 500 mg with Ginger 50 mg   pantoprazole 40 MG  tablet Commonly known as:  PROTONIX Take 1 tablet (40 mg total) by mouth daily. What changed:  when to take this   polyethylene glycol packet Commonly known as:  MIRALAX / GLYCOLAX Take 17 g by mouth 2 (two) times daily.   tiZANidine 4 MG capsule Commonly known as:  ZANAFLEX Take 1 capsule (4 mg total) by mouth 3 (three) times daily as needed for muscle spasms. What changed:  when to take this  reasons to take this   vitamin E 400 UNIT capsule Take 400 Units by mouth daily.        Signed: Anastasio Auerbach. Yuliana Vandrunen   PA-C  11/26/2016, 12:12 PM

## 2016-12-10 NOTE — H&P (Signed)
TOTAL KNEE ADMISSION H&P  Patient is being admitted for right total knee arthroplasty.  Subjective:  Chief Complaint:     Right knee primary OA / pain  HPI: William [name], 66 y.o. male, has a history of pain and functional disability in the right knee due to arthritis and has failed non-surgical conservative treatments for greater than 12 weeks to include NSAID's and/or analgesics, corticosteriod injections, viscosupplementation injections, use of assistive devices and activity modification.  Onset of symptoms was gradual, starting  years ago with gradually worsening course since that time. The patient noted prior procedures on the knee to include  arthroplasty on the left knee per [name] on 11/23/2016.  Patient currently rates pain in the right knee(s) at 8 out of 10 with activity. Patient has worsening of pain with activity and weight bearing, pain that interferes with activities of daily living, pain with passive range of motion, crepitus and joint swelling.  Patient has evidence of periarticular osteophytes and joint space narrowing by imaging studies.  There is no active infection.  Risks, benefits and expectations were discussed with the patient.  Risks including but not limited to the risk of anesthesia, blood clots, nerve damage, blood vessel damage, failure of the prosthesis, infection and up to and including death.  Patient understand the risks, benefits and expectations and wishes to proceed with surgery.   PCP: Pcp Not In System  D/C Plans:       Home with OPPT  Post-op Meds:       No Rx given   Tranexamic Acid:      To be given - IV   Decadron:      Is to be given  FYI:     ASA  Norco   DME: Already has DME      Patient Active Problem List   Diagnosis Date Noted  . Morbid obesity (HCC) 11/24/2016  . S/P left TKA 11/23/2016  . H/O cardiovascular stress test   . S/P colostomy takedown 06/19/2015  . S/P colostomy (HCC) 11/21/2014  . Hypothyroidism 11/20/2014  .  Essential hypertension 11/20/2014  . Acute on chronic respiratory failure (HCC) 11/19/2014  . Bacteroides fragilis infection 11/12/2014  . Normocytic anemia 11/12/2014  . Solitary pulmonary nodule on lung CT 10/28/2014  . Obstructive sleep apnea 10/28/2014  . Diverticulitis of colon with perforation s/p colectomy/colostomy 10/28/2014 10/27/2014   Past Medical History:  Diagnosis Date  . Anxiety   . Arthritis   . COPD (chronic obstructive pulmonary disease) (HCC)   . Depression   . Diverticulitis   . Essential hypertension 11/20/2014  . GERD (gastroesophageal reflux disease)   . H/O cardiovascular stress test    06/20/14 Nuclear stress test [facility]): Rest and stress images are WNL. No significant reversible ischemia or fixed scar. Gated LVEF 55%. NL LV wall motion    . Headache    hx of migraines  . History of kidney stones   . History of pneumonia   . History of sepsis January 2016  . Hypertension   . Hypothyroidism 11/20/2014  . Pneumonia    hx of  . Protein-calorie malnutrition (HCC) 11/20/2014  . Renal disorder    kidney stones  . Shortness of breath dyspnea   . Sleep apnea    nasal 13.5 settings  . Thyroid disease   . Wears glasses     Past Surgical History:  Procedure Laterality Date  . COLONOSCOPY    . COLOSTOMY N/A 10/28/2014   Procedure: COLOSTOMY;  Surgeon: [name], MD;  Location: [facility];  Service: General;  Laterality: N/A;  . COLOSTOMY TAKEDOWN  06/19/2015  . COLOSTOMY TAKEDOWN N/A 06/19/2015   Procedure: COLOSTOMY TAKEDOWN;  Surgeon: [name], MD;  Location: [facility] OR;  Service: General;  Laterality: N/A;  . FINGER AMPUTATION Left    index finger  . LAPAROTOMY N/A 10/28/2014   Procedure: EXPLORATORY LAPAROTOMY;  Surgeon: [name], MD;  Location: [facility];  Service: General;  Laterality: N/A;  . PARTIAL COLECTOMY N/A 10/28/2014   Procedure: PARTIAL COLECTOMY Sigmoid;  Surgeon: [name], MD;  Location: [facility];  Service: General;   Laterality: N/A;  . TOTAL KNEE ARTHROPLASTY Left 11/23/2016   Procedure: LEFT TOTAL KNEE ARTHROPLASTY;  Surgeon: [name], MD;  Location: [facility];  Service: Orthopedics;  Laterality: Left;  Adductor Block  . TRACHEOSTOMY TUBE PLACEMENT N/A 11/08/2014   Procedure: TRACHEOSTOMY;  Surgeon: [name], MD;  Location: [facility];  Service: ENT;  Laterality: N/A;    No prescriptions prior to admission.   Allergies  Allergen Reactions  . Aloprim [Allopurinol] Other (See Comments)    "MUSCLE SEIZED UP"  . Simvastatin Other (See Comments)    Muscle aches.  . Penicillins Rash    Has patient had a PCN reaction causing immediate rash, facial/tongue/throat swelling, SOB or lightheadedness with hypotension: No Has patient had a PCN reaction causing severe rash involving mucus membranes or skin necrosis: No Has patient had a PCN reaction that required hospitalization No Has patient had a PCN reaction occurring within the last 10 years: Yes If all of the above answers are "NO", then may proceed with Cephalosporin use.     Social History  Substance Use Topics  . Smoking status: Former Smoker    Quit date: 10/18/2004  . Smokeless tobacco: Never Used     Comment: only 3 months for smokeless  . Alcohol use 0.6 oz/week    1 Glasses of wine per week     Comment: occassionally     Family History  Problem Relation Age of Onset  . Colon cancer Neg Hx      Review of Systems  Constitutional: Negative.   HENT: Negative.   Eyes: Negative.   Respiratory: Positive for shortness of breath (with exertion).   Cardiovascular: Negative.   Gastrointestinal: Positive for heartburn.  Genitourinary: Negative.   Musculoskeletal: Positive for joint pain.  Skin: Negative.   Neurological: Positive for headaches.  Endo/Heme/Allergies: Negative.   Psychiatric/Behavioral: The patient is nervous/anxious.     Objective:  Physical Exam  Constitutional: He is oriented to person, place, and time. He appears  well-developed.  HENT:  Head: Normocephalic.  Eyes: Pupils are equal, round, and reactive to light.  Neck: Neck supple. No JVD present. No tracheal deviation present. No thyromegaly present.  Cardiovascular: Normal rate, regular rhythm, normal heart sounds and intact distal pulses.   Respiratory: Effort normal and breath sounds normal. No respiratory distress. He has no wheezes.  GI: Soft. There is no tenderness. There is no guarding.  Musculoskeletal:       Right knee: He exhibits decreased range of motion, swelling, abnormal alignment and bony tenderness. He exhibits no ecchymosis, no deformity, no laceration and no erythema. Tenderness found.  Lymphadenopathy:    He has no cervical adenopathy.  Neurological: He is alert and oriented to person, place, and time.  Skin: Skin is warm and dry.  Psychiatric: He has a normal mood and affect.  Labs:  Estimated body mass index is 41.94 kg/m as calculated from the following:   Height as of 11/23/16: 5\' 9"  (1.753 m).   Weight as of 11/23/16: 128.8 kg (284 lb).   Imaging Review Plain radiographs demonstrate severe degenerative joint disease of the right knee(s). The overall alignment is significant varus. The bone quality appears to be good for age and reported activity level.  Assessment/Plan:  End stage arthritis, right knee   The patient history, physical examination, clinical judgment of the provider and imaging studies are consistent with end stage degenerative joint disease of the right knee(s) and total knee arthroplasty is deemed medically necessary. The treatment options including medical management, injection therapy arthroscopy and arthroplasty were discussed at length. The risks and benefits of total knee arthroplasty were presented and reviewed. The risks due to aseptic loosening, infection, stiffness, patella tracking problems, thromboembolic complications and other imponderables were discussed. The patient acknowledged the  explanation, agreed to proceed with the plan and consent was signed. Patient is being admitted for inpatient treatment for surgery, pain control, PT, OT, prophylactic antibiotics, VTE prophylaxis, progressive ambulation and ADL's and discharge planning. The patient is planning to be discharged home.       Anastasio Auerbach Janiene Aarons   PA-C  12/10/2016, 2:36 PM

## 2016-12-21 NOTE — Patient Instructions (Signed)
William [name]  12/21/2016   Your procedure is scheduled on: 12/27/16  [facility]  [place] to 3rd floor to  Short Stay Center at    [phone].  Call this number if you have problems the morning of surgery [phone]   Remember: ONLY 1 PERSON MAY GO WITH YOU TO SHORT STAY TO GET  READY MORNING OF YOUR SURGERY.  Do not eat food or drink liquids :After Midnight.     Take these medicines the morning of surgery with A SIP OF WATER: Hydrocodone if needed, synthroid, metoprolol                                You may not have any metal on your body including hair pins and              piercings  Do not wear jewelry,  lotions, powders or perfumes, deodorant                      Men may shave face and neck.   Do not bring valuables to the hospital. [facility] IS NOT             RESPONSIBLE   FOR VALUABLES.  Contacts, dentures or bridgework may not be worn into surgery.  Leave suitcase in the car. After surgery it may be brought to your room.                  Please read over the following fact sheets you were given: _____________________________________________________________________             [facility] - Preparing for Surgery Before surgery, you can play an important role.  Because skin is not sterile, your skin needs to be as free of germs as possible.  You can reduce the number of germs on your skin by washing with CHG (chlorahexidine gluconate) soap before surgery.  CHG is an antiseptic cleaner which kills germs and bonds with the skin to continue killing germs even after washing. Please DO NOT use if you have an allergy to CHG or antibacterial soaps.  If your skin becomes reddened/irritated stop using the CHG and inform your nurse when you arrive at Short Stay. Do not shave (including legs and underarms) for at least 48 hours prior to the first CHG shower.  You may shave your face/neck. Please follow these instructions  carefully:  1.  Shower with CHG Soap the night before surgery and the  morning of Surgery.  2.  If you choose to wash your hair, wash your hair first as usual with your  normal  shampoo.  3.  After you shampoo, rinse your hair and body thoroughly to remove the  shampoo.                           4.  Use CHG as you would any other liquid soap.  You can apply chg directly  to the skin and wash                       Gently with a scrungie or clean washcloth.  5.  Apply the CHG Soap to your body ONLY FROM THE NECK DOWN.   Do not use on  face/ open                           Wound or open sores. Avoid contact with eyes, ears mouth and genitals (private parts).                       Wash face,  Genitals (private parts) with your normal soap.             6.  Wash thoroughly, paying special attention to the area where your surgery  will be performed.  7.  Thoroughly rinse your body with warm water from the neck down.  8.  DO NOT shower/wash with your normal soap after using and rinsing off  the CHG Soap.                9.  Pat yourself dry with a clean towel.            10.  Wear clean pajamas.            11.  Place clean sheets on your bed the night of your first shower and do not  sleep with pets. Day of Surgery : Do not apply any lotions/deodorants the morning of surgery.  Please wear clean clothes to the hospital/surgery center.  FAILURE TO FOLLOW THESE INSTRUCTIONS MAY RESULT IN THE CANCELLATION OF YOUR SURGERY PATIENT SIGNATURE_________________________________  NURSE SIGNATURE__________________________________  ________________________________________________________________________  WHAT IS A BLOOD TRANSFUSION? Blood Transfusion Information  A transfusion is the replacement of blood or some of its parts. Blood is made up of multiple cells which provide different functions.  Red blood cells carry oxygen and are used for blood loss replacement.  White blood cells fight against  infection.  Platelets control bleeding.  Plasma helps clot blood.  Other blood products are available for specialized needs, such as hemophilia or other clotting disorders. BEFORE THE TRANSFUSION  Who gives blood for transfusions?   Healthy volunteers who are fully evaluated to make sure their blood is safe. This is blood bank blood. Transfusion therapy is the safest it has ever been in the practice of medicine. Before blood is taken from a donor, a complete history is taken to make sure that person has no history of diseases nor engages in risky social behavior (examples are intravenous drug use or sexual activity with multiple partners). The donor's travel history is screened to minimize risk of transmitting infections, such as malaria. The donated blood is tested for signs of infectious diseases, such as HIV and hepatitis. The blood is then tested to be sure it is compatible with you in order to minimize the chance of a transfusion reaction. If you or a relative donates blood, this is often done in anticipation of surgery and is not appropriate for emergency situations. It takes many days to process the donated blood. RISKS AND COMPLICATIONS Although transfusion therapy is very safe and saves many lives, the main dangers of transfusion include:   Getting an infectious disease.  Developing a transfusion reaction. This is an allergic reaction to something in the blood you were given. Every precaution is taken to prevent this. The decision to have a blood transfusion has been considered carefully by your caregiver before blood is given. Blood is not given unless the benefits outweigh the risks. AFTER THE TRANSFUSION  Right after receiving a blood transfusion, you will usually feel much better and more energetic. This is especially true if your red  blood cells have gotten low (anemic). The transfusion raises the level of the red blood cells which carry oxygen, and this usually causes an energy  increase.  The nurse administering the transfusion will monitor you carefully for complications. HOME CARE INSTRUCTIONS  No special instructions are needed after a transfusion. You may find your energy is better. Speak with your caregiver about any limitations on activity for underlying diseases you may have. SEEK MEDICAL CARE IF:   Your condition is not improving after your transfusion.  You develop redness or irritation at the intravenous (IV) site. SEEK IMMEDIATE MEDICAL CARE IF:  Any of the following symptoms occur over the next 12 hours:  Shaking chills.  You have a temperature by mouth above 102 F (38.9 C), not controlled by medicine.  Chest, back, or muscle pain.  People around you feel you are not acting correctly or are confused.  Shortness of breath or difficulty breathing.  Dizziness and fainting.  You get a rash or develop hives.  You have a decrease in urine output.  Your urine turns a dark color or changes to pink, red, or brown. Any of the following symptoms occur over the next 10 days:  You have a temperature by mouth above 102 F (38.9 C), not controlled by medicine.  Shortness of breath.  Weakness after normal activity.  The white part of the eye turns yellow (jaundice).  You have a decrease in the amount of urine or are urinating less often.  Your urine turns a dark color or changes to pink, red, or brown. Document Released: 10/01/2000 Document Revised: 12/27/2011 Document Reviewed: 05/20/2008 [facility] Patient Information 2014 [place], [place].  _______________________________________________________________________

## 2016-12-22 ENCOUNTER — Encounter (HOSPITAL_COMMUNITY)
Admission: RE | Admit: 2016-12-22 | Discharge: 2016-12-22 | Disposition: A | Discharge status: Home / Self Care | Payer: 59 | Source: Ambulatory Visit | Attending: Orthopedic Surgery | Admitting: Orthopedic Surgery

## 2016-12-22 ENCOUNTER — Encounter (HOSPITAL_COMMUNITY): Payer: Self-pay

## 2016-12-22 DIAGNOSIS — Z6841 Body Mass Index (BMI) 40.0 and over, adult: Secondary | ICD-10-CM | POA: Diagnosis not present

## 2016-12-22 DIAGNOSIS — F329 Major depressive disorder, single episode, unspecified: Secondary | ICD-10-CM | POA: Diagnosis not present

## 2016-12-22 DIAGNOSIS — Z96652 Presence of left artificial knee joint: Secondary | ICD-10-CM | POA: Diagnosis not present

## 2016-12-22 DIAGNOSIS — E039 Hypothyroidism, unspecified: Secondary | ICD-10-CM | POA: Diagnosis not present

## 2016-12-22 DIAGNOSIS — Z9049 Acquired absence of other specified parts of digestive tract: Secondary | ICD-10-CM | POA: Diagnosis not present

## 2016-12-22 DIAGNOSIS — G4730 Sleep apnea, unspecified: Secondary | ICD-10-CM | POA: Diagnosis not present

## 2016-12-22 DIAGNOSIS — K219 Gastro-esophageal reflux disease without esophagitis: Secondary | ICD-10-CM | POA: Diagnosis not present

## 2016-12-22 DIAGNOSIS — Z7982 Long term (current) use of aspirin: Secondary | ICD-10-CM | POA: Diagnosis not present

## 2016-12-22 DIAGNOSIS — J449 Chronic obstructive pulmonary disease, unspecified: Secondary | ICD-10-CM | POA: Diagnosis not present

## 2016-12-22 DIAGNOSIS — Z79899 Other long term (current) drug therapy: Secondary | ICD-10-CM | POA: Diagnosis not present

## 2016-12-22 DIAGNOSIS — Z87891 Personal history of nicotine dependence: Secondary | ICD-10-CM | POA: Diagnosis not present

## 2016-12-22 DIAGNOSIS — Z01812 Encounter for preprocedural laboratory examination: Secondary | ICD-10-CM | POA: Insufficient documentation

## 2016-12-22 DIAGNOSIS — I10 Essential (primary) hypertension: Secondary | ICD-10-CM | POA: Diagnosis not present

## 2016-12-22 DIAGNOSIS — M1711 Unilateral primary osteoarthritis, right knee: Secondary | ICD-10-CM | POA: Diagnosis present

## 2016-12-22 DIAGNOSIS — F419 Anxiety disorder, unspecified: Secondary | ICD-10-CM | POA: Diagnosis not present

## 2016-12-22 LAB — SURGICAL PCR SCREEN
MRSA, PCR: INVALID — AB
Staphylococcus aureus: INVALID — AB

## 2016-12-22 LAB — BASIC METABOLIC PANEL
Anion gap: 6 (ref 5–15)
BUN: 22 mg/dL — AB (ref 6–20)
CALCIUM: 9.3 mg/dL (ref 8.9–10.3)
CO2: 28 mmol/L (ref 22–32)
Chloride: 105 mmol/L (ref 101–111)
Creatinine, Ser: 1.2 mg/dL (ref 0.61–1.24)
GFR calc non Af Amer: >60 mL/min (ref >60)
GLUCOSE: 156 mg/dL — AB (ref 65–99)
Potassium: 5.2 mmol/L — ABNORMAL HIGH (ref 3.5–5.1)
Sodium: 139 mmol/L (ref 135–145)

## 2016-12-22 LAB — CBC
HEMATOCRIT: 45.6 % (ref 39.0–52.0)
Hemoglobin: 15 g/dL (ref 13.0–17.0)
MCH: 31.3 pg (ref 26.0–34.0)
MCHC: 32.9 g/dL (ref 30.0–36.0)
MCV: 95.2 fL (ref 78.0–100.0)
Platelets: 205 10*3/uL (ref 150–400)
RBC: 4.79 MIL/uL (ref 4.22–5.81)
RDW: 16.2 % — AB (ref 11.5–15.5)
WBC: 8.5 10*3/uL (ref 4.0–10.5)

## 2016-12-25 LAB — MRSA CULTURE: Culture: NOT DETECTED

## 2016-12-26 MED ORDER — DEXTROSE 5 % IV SOLN
3.0000 g | INTRAVENOUS | Status: AC
  Administered 2016-12-27: 3 g via INTRAVENOUS
  Filled 2016-12-26: qty 3

## 2016-12-27 ENCOUNTER — Inpatient Hospital Stay (HOSPITAL_COMMUNITY): Payer: 59 | Admitting: Anesthesiology

## 2016-12-27 ENCOUNTER — Encounter (HOSPITAL_COMMUNITY): Payer: Self-pay | Admitting: *Deleted

## 2016-12-27 ENCOUNTER — Observation Stay (HOSPITAL_COMMUNITY)
Admission: RE | Admit: 2016-12-27 | Discharge: 2016-12-28 | Disposition: A | Discharge status: Home / Self Care | Payer: 59 | Source: Ambulatory Visit | Attending: Orthopedic Surgery | Admitting: Orthopedic Surgery

## 2016-12-27 ENCOUNTER — Encounter (HOSPITAL_COMMUNITY)
Admission: RE | Disposition: A | Discharge status: Home / Self Care | Payer: Self-pay | Source: Ambulatory Visit | Attending: Orthopedic Surgery

## 2016-12-27 DIAGNOSIS — M1711 Unilateral primary osteoarthritis, right knee: Principal | ICD-10-CM | POA: Insufficient documentation

## 2016-12-27 DIAGNOSIS — Z96652 Presence of left artificial knee joint: Secondary | ICD-10-CM | POA: Insufficient documentation

## 2016-12-27 DIAGNOSIS — Z7982 Long term (current) use of aspirin: Secondary | ICD-10-CM | POA: Insufficient documentation

## 2016-12-27 DIAGNOSIS — F329 Major depressive disorder, single episode, unspecified: Secondary | ICD-10-CM | POA: Insufficient documentation

## 2016-12-27 DIAGNOSIS — J449 Chronic obstructive pulmonary disease, unspecified: Secondary | ICD-10-CM | POA: Insufficient documentation

## 2016-12-27 DIAGNOSIS — G4730 Sleep apnea, unspecified: Secondary | ICD-10-CM | POA: Insufficient documentation

## 2016-12-27 DIAGNOSIS — Z9049 Acquired absence of other specified parts of digestive tract: Secondary | ICD-10-CM | POA: Insufficient documentation

## 2016-12-27 DIAGNOSIS — Z87891 Personal history of nicotine dependence: Secondary | ICD-10-CM | POA: Insufficient documentation

## 2016-12-27 DIAGNOSIS — Z96651 Presence of right artificial knee joint: Secondary | ICD-10-CM

## 2016-12-27 DIAGNOSIS — F419 Anxiety disorder, unspecified: Secondary | ICD-10-CM | POA: Insufficient documentation

## 2016-12-27 DIAGNOSIS — Z79899 Other long term (current) drug therapy: Secondary | ICD-10-CM | POA: Insufficient documentation

## 2016-12-27 DIAGNOSIS — E039 Hypothyroidism, unspecified: Secondary | ICD-10-CM | POA: Insufficient documentation

## 2016-12-27 DIAGNOSIS — I10 Essential (primary) hypertension: Secondary | ICD-10-CM | POA: Insufficient documentation

## 2016-12-27 DIAGNOSIS — Z6841 Body Mass Index (BMI) 40.0 and over, adult: Secondary | ICD-10-CM | POA: Insufficient documentation

## 2016-12-27 DIAGNOSIS — K219 Gastro-esophageal reflux disease without esophagitis: Secondary | ICD-10-CM | POA: Insufficient documentation

## 2016-12-27 HISTORY — PX: TOTAL KNEE ARTHROPLASTY: SHX125

## 2016-12-27 LAB — TYPE AND SCREEN
ABO/RH(D): O NEG
Antibody Screen: NEGATIVE

## 2016-12-27 SURGERY — ARTHROPLASTY, KNEE, TOTAL
Anesthesia: Spinal | Site: Knee | Laterality: Right

## 2016-12-27 MED ORDER — CELECOXIB 200 MG PO CAPS: 200.0000 mg | ORAL_CAPSULE | Freq: Two times a day (BID) | ORAL | Status: DC

## 2016-12-27 MED ORDER — VITAMIN E 180 MG (400 UNIT) PO CAPS
400.0000 [IU] | ORAL_CAPSULE | Freq: Every day | ORAL | Status: DC
  Filled 2016-12-27 (×2): qty 1

## 2016-12-27 MED ORDER — OXYCODONE HCL 5 MG PO TABS: 5.0000 mg | ORAL_TABLET | Freq: Once | ORAL | Status: DC | PRN

## 2016-12-27 MED ORDER — DEXAMETHASONE SODIUM PHOSPHATE 10 MG/ML IJ SOLN
10.0000 mg | Freq: Once | INTRAMUSCULAR | Status: AC
  Administered 2016-12-27: 10 mg via INTRAVENOUS

## 2016-12-27 MED ORDER — PHENYLEPHRINE HCL 10 MG/ML IJ SOLN
INTRAMUSCULAR | Status: AC
  Filled 2016-12-27: qty 1

## 2016-12-27 MED ORDER — PROPOFOL 10 MG/ML IV BOLUS
INTRAVENOUS | Status: AC
  Filled 2016-12-27: qty 60

## 2016-12-27 MED ORDER — ONDANSETRON HCL 4 MG/2ML IJ SOLN
INTRAMUSCULAR | Status: DC | PRN
  Administered 2016-12-27: 4 mg via INTRAVENOUS

## 2016-12-27 MED ORDER — HYDROCODONE-ACETAMINOPHEN 7.5-325 MG PO TABS
1.0000 | ORAL_TABLET | ORAL | Status: DC | PRN
  Administered 2016-12-27: 1 via ORAL
  Administered 2016-12-27: 21:00:00 2 via ORAL
  Administered 2016-12-27: 11:00:00 1 via ORAL
  Administered 2016-12-27 – 2016-12-28 (×5): 2 via ORAL
  Filled 2016-12-27: qty 2
  Filled 2016-12-27: qty 1
  Filled 2016-12-27 (×2): qty 2
  Filled 2016-12-27: qty 1
  Filled 2016-12-27 (×3): qty 2

## 2016-12-27 MED ORDER — LIP MEDEX EX OINT
TOPICAL_OINTMENT | CUTANEOUS | Status: AC
  Filled 2016-12-27: qty 7

## 2016-12-27 MED ORDER — CEFAZOLIN SODIUM-DEXTROSE 2-4 GM/100ML-% IV SOLN
2.0000 g | Freq: Four times a day (QID) | INTRAVENOUS | Status: AC
  Administered 2016-12-27 (×2): 2 g via INTRAVENOUS
  Filled 2016-12-27 (×2): qty 100

## 2016-12-27 MED ORDER — PHENYLEPHRINE 40 MCG/ML (10ML) SYRINGE FOR IV PUSH (FOR BLOOD PRESSURE SUPPORT)
PREFILLED_SYRINGE | INTRAVENOUS | Status: AC
  Filled 2016-12-27: qty 10

## 2016-12-27 MED ORDER — CHLORHEXIDINE GLUCONATE 4 % EX LIQD: 60.0000 mL | Freq: Once | CUTANEOUS | Status: DC

## 2016-12-27 MED ORDER — BUPIVACAINE-EPINEPHRINE 0.25% -1:200000 IJ SOLN
INTRAMUSCULAR | Status: AC
  Filled 2016-12-27: qty 1

## 2016-12-27 MED ORDER — ALUM & MAG HYDROXIDE-SIMETH 200-200-20 MG/5ML PO SUSP
30.0000 mL | ORAL | Status: DC | PRN
Start: 2016-12-27 — End: 2016-12-28

## 2016-12-27 MED ORDER — METHOCARBAMOL 1000 MG/10ML IJ SOLN
500.0000 mg | Freq: Four times a day (QID) | INTRAVENOUS | Status: DC | PRN
  Filled 2016-12-27: qty 5

## 2016-12-27 MED ORDER — HYDROCODONE-ACETAMINOPHEN 7.5-325 MG PO TABS: 1.0000 | ORAL_TABLET | ORAL | Status: DC | PRN

## 2016-12-27 MED ORDER — BUPIVACAINE HCL (PF) 0.5 % IJ SOLN
INTRAMUSCULAR | Status: AC
  Filled 2016-12-27: qty 30

## 2016-12-27 MED ORDER — METOPROLOL TARTRATE 12.5 MG HALF TABLET
12.5000 mg | ORAL_TABLET | Freq: Every day | ORAL | Status: DC
  Administered 2016-12-28: 09:00:00 12.5 mg via ORAL
  Filled 2016-12-27: qty 1

## 2016-12-27 MED ORDER — HYDROMORPHONE HCL 1 MG/ML IJ SOLN: 0.2500 mg | INTRAMUSCULAR | Status: DC | PRN

## 2016-12-27 MED ORDER — TIZANIDINE HCL 4 MG PO TABS: 4.0000 mg | ORAL_TABLET | Freq: Three times a day (TID) | ORAL | Status: DC | PRN

## 2016-12-27 MED ORDER — VITAMIN D3 25 MCG (1000 UNIT) PO TABS
1000.0000 [IU] | ORAL_TABLET | Freq: Every day | ORAL | Status: DC
Start: 2016-12-27 — End: 2016-12-28
  Administered 2016-12-28: 1000 [IU] via ORAL
  Filled 2016-12-27: qty 1

## 2016-12-27 MED ORDER — ASPIRIN 81 MG PO CHEW
81.0000 mg | CHEWABLE_TABLET | Freq: Two times a day (BID) | ORAL | Status: DC
  Administered 2016-12-27 – 2016-12-28 (×2): 81 mg via ORAL
  Filled 2016-12-27 (×2): qty 1

## 2016-12-27 MED ORDER — LEVOTHYROXINE SODIUM 100 MCG PO TABS
300.0000 ug | ORAL_TABLET | Freq: Every day | ORAL | Status: DC
Start: 2016-12-28 — End: 2016-12-28
  Administered 2016-12-28: 300 ug via ORAL
  Filled 2016-12-27: qty 3

## 2016-12-27 MED ORDER — DEXAMETHASONE SODIUM PHOSPHATE 10 MG/ML IJ SOLN
INTRAMUSCULAR | Status: AC
  Filled 2016-12-27: qty 1

## 2016-12-27 MED ORDER — ACETAMINOPHEN 325 MG PO TABS: 650.0000 mg | ORAL_TABLET | Freq: Four times a day (QID) | ORAL | Status: DC | PRN

## 2016-12-27 MED ORDER — PROMETHAZINE HCL 25 MG/ML IJ SOLN: 6.2500 mg | INTRAMUSCULAR | Status: DC | PRN

## 2016-12-27 MED ORDER — PROPOFOL 10 MG/ML IV BOLUS
INTRAVENOUS | Status: AC
  Filled 2016-12-27: qty 20

## 2016-12-27 MED ORDER — METHOCARBAMOL 1000 MG/10ML IJ SOLN
500.0000 mg | Freq: Once | INTRAMUSCULAR | Status: DC
  Filled 2016-12-27: qty 5

## 2016-12-27 MED ORDER — PHENOL 1.4 % MT LIQD
1.0000 | OROMUCOSAL | Status: DC | PRN
  Filled 2016-12-27: qty 177

## 2016-12-27 MED ORDER — MENTHOL 3 MG MT LOZG
1.0000 | LOZENGE | OROMUCOSAL | Status: DC | PRN
Start: 2016-12-27 — End: 2016-12-28

## 2016-12-27 MED ORDER — ONDANSETRON HCL 4 MG/2ML IJ SOLN
INTRAMUSCULAR | Status: AC
  Filled 2016-12-27: qty 2

## 2016-12-27 MED ORDER — PROPOFOL 10 MG/ML IV BOLUS
INTRAVENOUS | Status: DC | PRN
  Administered 2016-12-27: 10 mg via INTRAVENOUS
  Administered 2016-12-27: 20 mg via INTRAVENOUS
  Administered 2016-12-27: 10 mg via INTRAVENOUS

## 2016-12-27 MED ORDER — LACTATED RINGERS IV SOLN
INTRAVENOUS | Status: DC | PRN
  Administered 2016-12-27 (×3): via INTRAVENOUS

## 2016-12-27 MED ORDER — CELECOXIB 200 MG PO CAPS
200.0000 mg | ORAL_CAPSULE | Freq: Two times a day (BID) | ORAL | Status: DC
  Administered 2016-12-27 – 2016-12-28 (×2): 200 mg via ORAL
  Filled 2016-12-27 (×2): qty 1

## 2016-12-27 MED ORDER — PANTOPRAZOLE SODIUM 40 MG PO TBEC
40.0000 mg | DELAYED_RELEASE_TABLET | Freq: Every day | ORAL | Status: DC
  Administered 2016-12-27: 22:00:00 40 mg via ORAL
  Filled 2016-12-27: qty 1

## 2016-12-27 MED ORDER — MIDAZOLAM HCL 2 MG/2ML IJ SOLN
INTRAMUSCULAR | Status: AC
  Filled 2016-12-27: qty 2

## 2016-12-27 MED ORDER — SODIUM CHLORIDE 0.9 % IV SOLN
1000.0000 mg | INTRAVENOUS | Status: AC
  Administered 2016-12-27: 1000 mg via INTRAVENOUS
  Filled 2016-12-27: qty 1100

## 2016-12-27 MED ORDER — DOCUSATE SODIUM 100 MG PO CAPS
100.0000 mg | ORAL_CAPSULE | Freq: Two times a day (BID) | ORAL | Status: DC
  Administered 2016-12-27 – 2016-12-28 (×2): 100 mg via ORAL
  Filled 2016-12-27 (×2): qty 1

## 2016-12-27 MED ORDER — PHENYLEPHRINE HCL 10 MG/ML IJ SOLN
INTRAVENOUS | Status: DC | PRN
  Administered 2016-12-27: 10 ug/min via INTRAVENOUS

## 2016-12-27 MED ORDER — KETOROLAC TROMETHAMINE 30 MG/ML IJ SOLN
INTRAMUSCULAR | Status: DC | PRN
  Administered 2016-12-27: 30 mg

## 2016-12-27 MED ORDER — SODIUM CHLORIDE 0.9 % IJ SOLN
INTRAMUSCULAR | Status: DC | PRN
  Administered 2016-12-27: 30 mL

## 2016-12-27 MED ORDER — SODIUM CHLORIDE 0.9 % IV SOLN
INTRAVENOUS | Status: DC
  Administered 2016-12-27: 11:00:00 100 mL/h via INTRAVENOUS

## 2016-12-27 MED ORDER — ONDANSETRON HCL 4 MG/2ML IJ SOLN: 4.0000 mg | Freq: Four times a day (QID) | INTRAMUSCULAR | Status: DC | PRN

## 2016-12-27 MED ORDER — METHOCARBAMOL 500 MG PO TABS
500.0000 mg | ORAL_TABLET | Freq: Four times a day (QID) | ORAL | Status: DC | PRN
  Administered 2016-12-27 – 2016-12-28 (×4): 500 mg via ORAL
  Filled 2016-12-27 (×4): qty 1

## 2016-12-27 MED ORDER — ONDANSETRON HCL 4 MG PO TABS: 4.0000 mg | ORAL_TABLET | Freq: Four times a day (QID) | ORAL | Status: DC | PRN

## 2016-12-27 MED ORDER — DEXAMETHASONE SODIUM PHOSPHATE 10 MG/ML IJ SOLN
10.0000 mg | Freq: Once | INTRAMUSCULAR | Status: DC
Start: 2016-12-28 — End: 2016-12-27

## 2016-12-27 MED ORDER — PROPOFOL 500 MG/50ML IV EMUL
INTRAVENOUS | Status: DC | PRN
  Administered 2016-12-27: 75 ug/kg/min via INTRAVENOUS

## 2016-12-27 MED ORDER — METHOCARBAMOL 500 MG PO TABS: 500.0000 mg | ORAL_TABLET | Freq: Four times a day (QID) | ORAL | Status: DC | PRN

## 2016-12-27 MED ORDER — ACETAMINOPHEN 650 MG RE SUPP: 650.0000 mg | Freq: Four times a day (QID) | RECTAL | Status: DC | PRN

## 2016-12-27 MED ORDER — HYDROMORPHONE HCL 1 MG/ML IJ SOLN
0.5000 mg | INTRAMUSCULAR | Status: DC | PRN
  Administered 2016-12-27: 1 mg via INTRAVENOUS
  Filled 2016-12-27: qty 1

## 2016-12-27 MED ORDER — METOCLOPRAMIDE HCL 5 MG/ML IJ SOLN: 5.0000 mg | Freq: Three times a day (TID) | INTRAMUSCULAR | Status: DC | PRN

## 2016-12-27 MED ORDER — LIDOCAINE 2% (20 MG/ML) 5 ML SYRINGE
INTRAMUSCULAR | Status: DC | PRN
  Administered 2016-12-27: 60 mg via INTRAVENOUS

## 2016-12-27 MED ORDER — LIDOCAINE 2% (20 MG/ML) 5 ML SYRINGE
INTRAMUSCULAR | Status: AC
  Filled 2016-12-27: qty 5

## 2016-12-27 MED ORDER — BUPIVACAINE-EPINEPHRINE (PF) 0.25% -1:200000 IJ SOLN
INTRAMUSCULAR | Status: DC | PRN
  Administered 2016-12-27: 30 mL

## 2016-12-27 MED ORDER — DEXAMETHASONE SODIUM PHOSPHATE 10 MG/ML IJ SOLN
10.0000 mg | Freq: Once | INTRAMUSCULAR | Status: AC
  Administered 2016-12-28: 10 mg via INTRAVENOUS
  Filled 2016-12-27: qty 1

## 2016-12-27 MED ORDER — POLYETHYLENE GLYCOL 3350 17 G PO PACK
17.0000 g | PACK | Freq: Two times a day (BID) | ORAL | Status: DC
  Administered 2016-12-28: 09:00:00 17 g via ORAL
  Filled 2016-12-27 (×2): qty 1

## 2016-12-27 MED ORDER — OMEGA-3-ACID ETHYL ESTERS 1 G PO CAPS
1.0000 g | ORAL_CAPSULE | Freq: Every day | ORAL | Status: DC
  Administered 2016-12-28: 1 g via ORAL
  Filled 2016-12-27: qty 1

## 2016-12-27 MED ORDER — METOCLOPRAMIDE HCL 5 MG PO TABS: 5.0000 mg | ORAL_TABLET | Freq: Three times a day (TID) | ORAL | Status: DC | PRN

## 2016-12-27 MED ORDER — SODIUM CHLORIDE 0.9 % IJ SOLN
INTRAMUSCULAR | Status: AC
  Filled 2016-12-27: qty 50

## 2016-12-27 MED ORDER — KETOROLAC TROMETHAMINE 30 MG/ML IJ SOLN
INTRAMUSCULAR | Status: AC
  Filled 2016-12-27: qty 1

## 2016-12-27 MED ORDER — HYDROXYZINE HCL 50 MG PO TABS
50.0000 mg | ORAL_TABLET | Freq: Every day | ORAL | Status: DC
  Administered 2016-12-27: 22:00:00 50 mg via ORAL
  Filled 2016-12-27: qty 1

## 2016-12-27 MED ORDER — OXYCODONE HCL 5 MG/5ML PO SOLN: 5.0000 mg | Freq: Once | ORAL | Status: DC | PRN

## 2016-12-27 MED ORDER — PHENYLEPHRINE HCL 10 MG/ML IJ SOLN
INTRAMUSCULAR | Status: DC | PRN
  Administered 2016-12-27 (×5): 80 ug via INTRAVENOUS

## 2016-12-27 MED ORDER — TRANEXAMIC ACID 1000 MG/10ML IV SOLN
1000.0000 mg | Freq: Once | INTRAVENOUS | Status: AC
  Administered 2016-12-27: 12:00:00 1000 mg via INTRAVENOUS
  Filled 2016-12-27: qty 1100

## 2016-12-27 MED ORDER — MIDAZOLAM HCL 5 MG/5ML IJ SOLN
INTRAMUSCULAR | Status: DC | PRN
  Administered 2016-12-27 (×2): 1 mg via INTRAVENOUS

## 2016-12-27 MED ORDER — BUPIVACAINE HCL (PF) 0.75 % IJ SOLN
INTRAMUSCULAR | Status: DC | PRN
  Administered 2016-12-27: 15 mg via INTRATHECAL

## 2016-12-27 SURGICAL SUPPLY — 47 items
BAG DECANTER FOR FLEXI CONT (MISCELLANEOUS) IMPLANT
BAG ZIPLOCK 12X15 (MISCELLANEOUS) IMPLANT
BANDAGE ACE 6X5 VEL STRL LF (GAUZE/BANDAGES/DRESSINGS) ×3 IMPLANT
BLADE SAW SGTL 11.0X1.19X90.0M (BLADE) IMPLANT
BLADE SAW SGTL 13.0X1.19X90.0M (BLADE) ×3 IMPLANT
BOWL SMART MIX CTS (DISPOSABLE) ×3 IMPLANT
CAP KNEE TOTAL 3 SIGMA ×3 IMPLANT
CEMENT HV SMART SET (Cement) ×6 IMPLANT
CUFF TOURN SGL QUICK 34 (TOURNIQUET CUFF) ×2
CUFF TRNQT CYL 34X4X40X1 (TOURNIQUET CUFF) ×1 IMPLANT
DECANTER SPIKE VIAL GLASS SM (MISCELLANEOUS) ×3 IMPLANT
DERMABOND ADVANCED (GAUZE/BANDAGES/DRESSINGS) ×2
DERMABOND ADVANCED .7 DNX12 (GAUZE/BANDAGES/DRESSINGS) ×1 IMPLANT
DRAPE U-SHAPE 47X51 STRL (DRAPES) ×3 IMPLANT
DRESSING AQUACEL AG SP 3.5X10 (GAUZE/BANDAGES/DRESSINGS) ×1 IMPLANT
DRSG AQUACEL AG ADV 3.5X10 (GAUZE/BANDAGES/DRESSINGS) ×3 IMPLANT
DRSG AQUACEL AG SP 3.5X10 (GAUZE/BANDAGES/DRESSINGS) ×3
DURAPREP 26ML APPLICATOR (WOUND CARE) ×6 IMPLANT
ELECT REM PT RETURN 9FT ADLT (ELECTROSURGICAL) ×3
ELECTRODE REM PT RTRN 9FT ADLT (ELECTROSURGICAL) ×1 IMPLANT
GLOVE BIOGEL M 7.0 STRL (GLOVE) IMPLANT
GLOVE BIOGEL PI IND STRL 7.5 (GLOVE) ×1 IMPLANT
GLOVE BIOGEL PI IND STRL 8.5 (GLOVE) IMPLANT
GLOVE BIOGEL PI INDICATOR 7.5 (GLOVE) ×2
GLOVE BIOGEL PI INDICATOR 8.5 (GLOVE)
GLOVE ECLIPSE 8.0 STRL XLNG CF (GLOVE) IMPLANT
GLOVE INDICATOR 6.5 STRL GRN (GLOVE) ×6 IMPLANT
GLOVE ORTHO TXT STRL SZ7.5 (GLOVE) ×6 IMPLANT
GOWN STRL REUS W/TWL LRG LVL3 (GOWN DISPOSABLE) ×3 IMPLANT
GOWN STRL REUS W/TWL XL LVL3 (GOWN DISPOSABLE) IMPLANT
HANDPIECE INTERPULSE COAX TIP (DISPOSABLE) ×2
MANIFOLD NEPTUNE II (INSTRUMENTS) ×3 IMPLANT
PACK TOTAL KNEE CUSTOM (KITS) ×3 IMPLANT
POSITIONER SURGICAL ARM (MISCELLANEOUS) ×3 IMPLANT
SET HNDPC FAN SPRY TIP SCT (DISPOSABLE) ×1 IMPLANT
SET PAD KNEE POSITIONER (MISCELLANEOUS) ×3 IMPLANT
SUT MNCRL AB 4-0 PS2 18 (SUTURE) ×3 IMPLANT
SUT VIC AB 1 CT1 36 (SUTURE) ×6 IMPLANT
SUT VIC AB 2-0 CT1 27 (SUTURE) ×6
SUT VIC AB 2-0 CT1 TAPERPNT 27 (SUTURE) ×3 IMPLANT
SUT VLOC 180 0 24IN GS25 (SUTURE) ×3 IMPLANT
SYR 50ML LL SCALE MARK (SYRINGE) IMPLANT
TRAY FOLEY W/METER SILVER 16FR (SET/KITS/TRAYS/PACK) ×3 IMPLANT
TRAY REVISION SZ 4 (Knees) ×3 IMPLANT
WATER STERILE IRR 1500ML POUR (IV SOLUTION) ×3 IMPLANT
WRAP KNEE MAXI GEL POST OP (GAUZE/BANDAGES/DRESSINGS) ×3 IMPLANT
YANKAUER SUCT BULB TIP 10FT TU (MISCELLANEOUS) ×3 IMPLANT

## 2016-12-27 NOTE — Anesthesia Preprocedure Evaluation (Signed)
Anesthesia Evaluation  Patient identified by MRN, date of birth, ID band Patient awake    Reviewed: Allergy & Precautions, H&P , NPO status , Patient's Chart, lab work & pertinent test results, reviewed documented beta blocker date and time   Airway Mallampati: II  TM Distance: >3 FB Neck ROM: Full    Dental no notable dental hx. (+) Edentulous Upper, Edentulous Lower, Dental Advisory Given   Pulmonary sleep apnea and Continuous Positive Airway Pressure Ventilation , COPD, former smoker,    Pulmonary exam normal breath sounds clear to auscultation       Cardiovascular Exercise Tolerance: Good hypertension, Pt. on medications and Pt. on home beta blockers  Rhythm:Regular Rate:Normal     Neuro/Psych Anxiety Depression negative neurological ROS  negative psych ROS   GI/Hepatic Neg liver ROS, GERD  Medicated and Controlled,  Endo/Other  Hypothyroidism Morbid obesity  Renal/GU negative Renal ROS  negative genitourinary   Musculoskeletal  (+) Arthritis , Osteoarthritis,    Abdominal   Peds  Hematology  (+) anemia ,   Anesthesia Other Findings   Reproductive/Obstetrics negative OB ROS                             Anesthesia Physical  Anesthesia Plan  ASA: III  Anesthesia Plan: Spinal   Post-op Pain Management:  Regional for Post-op pain   Induction: Intravenous  Airway Management Planned: Simple Face Mask  Additional Equipment:   Intra-op Plan:   Post-operative Plan:   Informed Consent: I have reviewed the patients History and Physical, chart, labs and discussed the procedure including the risks, benefits and alternatives for the proposed anesthesia with the patient or authorized representative who has indicated his/her understanding and acceptance.   Dental advisory given  Plan Discussed with: CRNA  Anesthesia Plan Comments:         Anesthesia Quick Evaluation

## 2016-12-27 NOTE — Interval H&P Note (Signed)
History and Physical Interval Note:  12/27/2016 7:29 AM  William [name]  has presented today for surgery, with the diagnosis of Right knee osteoarthritis  The various methods of treatment have been discussed with the patient and family. After consideration of risks, benefits and other options for treatment, the patient has consented to  Procedure(s) with comments: RIGHT TOTAL KNEE ARTHROPLASTY (Right) - Requests 90 mins as a surgical intervention .  The patient's history has been reviewed, patient examined, no change in status, stable for surgery.  I have reviewed the patient's chart and labs.  Questions were answered to the patient's satisfaction.     Shelda PalLIN,Iantha Titsworth D

## 2016-12-27 NOTE — Progress Notes (Signed)
Assisted Dr Nevada CraneW Miller with right, ultrasound guided, adductor canal block. Side rails up, monitors on throughout procedure. See vital signs in flow sheet. Tolerated Procedure well.

## 2016-12-27 NOTE — Anesthesia Procedure Notes (Addendum)
Anesthesia Regional Block: Adductor canal block   Pre-Anesthetic Checklist: ,, timeout performed, Correct Patient, Correct Site, Correct Laterality, Correct Procedure, Correct Position, site marked, Risks and benefits discussed,  Surgical consent,  Pre-op evaluation,  At surgeon's request and post-op pain management  Laterality: Right  Prep: Dura Prep       Needles:  Injection technique: Single-shot  Needle Type: Echogenic Needle          Additional Needles:   Procedures: ultrasound guided,,,,,,,,  Narrative:  Start time: 12/27/2016 7:10 AM End time: 12/27/2016 7:16 AM  Performed by: Personally  Anesthesiologist: Anitra LauthMILLER, Genavive Kubicki RAY  Additional Notes: 20 ml 0.5% Ropivacaine

## 2016-12-27 NOTE — Op Note (Signed)
NAME:  William [name]                      MEDICAL RECORD NO.:  [id number]                             FACILITY[facility]      PHYSICIAN:  Madlyn FrankelMatthew D. Charlann Boxerlin, M.D.  DATE OF BIRTH:  08/06/1951      DATE OF PROCEDURE:  12/27/2016                                     OPERATIVE REPORT         PREOPERATIVE DIAGNOSIS:  Right knee osteoarthritis.      POSTOPERATIVE DIAGNOSIS:  Right knee osteoarthritis.      FINDINGS:  The patient was noted to have complete loss of cartilage and   bone-on-bone arthritis with associated osteophytes in the medial and patellofemoral compartments of   the knee with a significant synovitis and associated effusion.      PROCEDURE:  Right total knee replacement.      COMPONENTS USED:  [employer] rotating platform posterior stabilized knee   system, a size 5 femur, 4 MBT revision tibial tray, 15 mm PS insert, and 41 patellar   button.      SURGEON:  Madlyn FrankelMatthew D. Charlann Boxerlin, M.D.      ASSISTANT:  Surgical team     ANESTHESIA:  Spinal.      SPECIMENS:  None.      COMPLICATION:  None.      DRAINS:  None.  EBL: <100cc      TOURNIQUET TIME:   Total Tourniquet Time Documented: Thigh (Right) - 46 minutes Total: Thigh (Right) - 46 minutes  .      The patient was stable to the recovery room.      INDICATION FOR PROCEDURE:  William [name] is a 66 y.o. male patient of   mine.  The patient had been seen, evaluated, and treated conservatively in the   office with medication, activity modification, and injections.  The patient had   radiographic changes of bone-on-bone arthritis with endplate sclerosis and osteophytes noted.      The patient failed conservative measures including medication, injections, and activity modification, and at this point was ready for more definitive measures.   Based on the radiographic changes and failed conservative measures, the patient   decided to proceed with total knee replacement.  Risks of infection,   DVT, component failure,  need for revision surgery, postop course, and   expectations were all   discussed and reviewed.  Consent was obtained for benefit of pain   relief.      PROCEDURE IN DETAIL:  The patient was brought to the operative theater.   Once adequate anesthesia, preoperative antibiotics, 3 gm of Ancef, 1 gm of Tranexamic Acid, and 10 mg of Decadron administered, the patient was positioned supine with the right thigh tourniquet placed.  The  right lower extremity was prepped and draped in sterile fashion.  A time-   out was performed identifying the patient, planned procedure, and   extremity.      The right lower extremity was placed in the [facility] leg holder.  The leg was   exsanguinated, tourniquet elevated to 250 mmHg.  A midline incision was   made followed by  median parapatellar arthrotomy.  Following initial   exposure, attention was first directed to the patella.  Precut   measurement was noted to be 25 mm.  I resected down to 14-15 mm and used a   41 patellar button to restore patellar height as well as cover the cut   surface.      The lug holes were drilled and a metal shim was placed to protect the   patella from retractors and saw blades.      At this point, attention was now directed to the femur.  The femoral   canal was opened with a drill, irrigated to try to prevent fat emboli.  An   intramedullary rod was passed at 5 degrees valgus, 10 mm of bone was   resected off the distal femur.  Following this resection, the tibia was   subluxated anteriorly.  Using the extramedullary guide, 2 mm of bone was resected off   the proximal medial tibia.  We confirmed the gap would be   stable medially and laterally with a 10 mm insert as well as confirmed   the cut was perpendicular in the coronal plane, checking with an alignment rod.      Once this was done, I sized the femur to be a size 5 in the anterior-   posterior dimension, chose a standard component based on medial and   lateral  dimension.  The size 5 rotation block was then pinned in   position anterior referenced using the C-clamp to set rotation.  The   anterior, posterior, and  chamfer cuts were made without difficulty nor   notching making certain that I was along the anterior cortex to help   with flexion gap stability.      The final box cut was made off the lateral aspect of distal femur.      At this point, the tibia was sized to be a size 4, the size 4 tray was   then pinned in position through the medial third of the tubercle,   drilled for the MBT revision tray, and then with the revision trial tray in place, keel punched.  Trial reduction was now carried with a 5 femur,  4 MBT revision tibial tray, a 12.5 then 15 mm PS insert, and the 41 patella botton.  The knee was brought to   extension, full extension with good flexion stability with the patella   tracking through the trochlea without application of pressure.  Given   all these findings, the trial components removed.  Final components were   opened and cement was mixed.  The knee was irrigated with normal saline   solution and pulse lavage.  The synovial lining was   then injected with 30 cc 0.25% Marcaine with epinephrine and 1 cc of Toradol plus 30 cc of NS for a  total of 61 cc.      The knee was irrigated.  Final implants were then cemented onto clean and   dried cut surfaces of bone with the knee brought to extension with a size 15 mm trial insert.      Once the cement had fully cured, the excess cement was removed   throughout the knee.  I confirmed I was satisfied with the range of   motion and stability, and the final size 15 mm PS insert was chosen.  It was   placed into the knee.      The tourniquet had been let down  at 46 minutes.  No significant   hemostasis required.  The   extensor mechanism was then reapproximated using #1 Vicryl and #0 V-lock sutures with the knee   in flexion.  The   remaining wound was closed with 2-0 Vicryl  and running 4-0 Monocryl.   The knee was cleaned, dried, dressed sterilely using Dermabond and   Aquacel dressing.  The patient was then   brought to recovery room in stable condition, tolerating the procedure   well.               Madlyn Frankel Charlann Boxer, M.D.    12/27/2016 10:05 AM

## 2016-12-27 NOTE — Transfer of Care (Signed)
Immediate Anesthesia Transfer of Care Note  Patient: William [name]  Procedure(s) Performed: Procedure(s) with comments: RIGHT TOTAL KNEE ARTHROPLASTY (Right) - Requests 90 mins  Patient Location: PACU  Anesthesia Type:MAC and Spinal  Level of Consciousness: awake, alert  and patient cooperative  Airway & Oxygen Therapy: Patient Spontanous Breathing and Patient connected to face mask oxygen  Post-op Assessment: Report given to RN and Post -op Vital signs reviewed and stable  Post vital signs: Reviewed and stable  Last Vitals:  Vitals:   12/27/16 0641  BP: 118/76  Pulse: (!) 107  Resp: 18  Temp: 37.2 C    Last Pain:  Vitals:   12/27/16 0644  TempSrc:   PainSc: 2       Patients Stated Pain Goal: 4 (44/01/02 7253)  Complications: No apparent anesthesia complications

## 2016-12-27 NOTE — Evaluation (Signed)
Physical Therapy Evaluation Patient Details Name: William [name] MRN: [id number] DOB: 28-Feb-1951 Today's Date: 12/27/2016   History of Present Illness  R TKA  Clinical Impression  The patient ambulated x 6'. Plans OPPT. Pt admitted with above diagnosis. Pt currently with functional limitations due to the deficits listed below (see PT Problem List). Pt will benefit from skilled PT to increase their independence and safety with mobility to allow discharge to the venue listed below.       Follow Up Recommendations Outpatient PT;Supervision/Assistance - 24 hour    Equipment Recommendations  None recommended by PT    Recommendations for Other Services       Precautions / Restrictions Precautions Precautions: Knee      Mobility  Bed Mobility Overal bed mobility: Needs Assistance Bed Mobility: Supine to Sit     Supine to sit: Min assist     General bed mobility comments: assist with right leg  Transfers Overall transfer level: Needs assistance Equipment used: Rolling walker (2 wheeled) Transfers: Sit to/from Stand Sit to Stand: Min assist;From elevated surface         General transfer comment: cues for hand and right leg position  Ambulation/Gait Ambulation/Gait assistance: Min assist Ambulation Distance (Feet): 6 Feet Assistive device: Rolling walker (2 wheeled) Gait Pattern/deviations: Step-to pattern;Antalgic     General Gait Details: limited bypain of the right knee  Stairs            Wheelchair Mobility    Modified Rankin (Stroke Patients Only)       Balance                                             Pertinent Vitals/Pain Pain Assessment: 0-10 Pain Score: 4  Pain Location: right lateral knee Pain Descriptors / Indicators: Aching Pain Intervention(s): Limited activity within patient's tolerance;Monitored during session;Premedicated before session;Repositioned;Ice applied    Home Living Family/patient expects to be  discharged to:: Private residence Living Arrangements: Spouse/significant other Available Help at Discharge: Family Type of Home: House Home Access: Stairs to enter Entrance Stairs-Rails: Right;Left;Can reach both Entrance Stairs-Number of Steps: 3 Home Layout: One level Home Equipment: Environmental consultant - 2 wheels;Cane - single point;Shower seat Additional Comments: has a grab bar by toilet    Prior Function                 Hand Dominance        Extremity/Trunk Assessment   Upper Extremity Assessment Upper Extremity Assessment: Overall WFL for tasks assessed    Lower Extremity Assessment Lower Extremity Assessment: RLE deficits/detail RLE Deficits / Details: able to  raise leg om bed, knee flexion 10-50       Communication   Communication: No difficulties  Cognition Arousal/Alertness: Awake/alert Behavior During Therapy: WFL for tasks assessed/performed Overall Cognitive Status: Within Functional Limits for tasks assessed                      General Comments      Exercises Total Joint Exercises Ankle Circles/Pumps: AROM;5 reps Quad Sets: AROM;Right;5 reps   Assessment/Plan    PT Assessment Patient needs continued PT services  PT Problem List Decreased strength;Decreased range of motion;Decreased activity tolerance;Decreased mobility;Decreased knowledge of precautions;Decreased safety awareness;Decreased knowledge of use of DME;Pain       PT Treatment Interventions DME instruction;Gait training;Stair training;Functional mobility training;Therapeutic exercise;Therapeutic activities;Patient/family  education    PT Goals (Current goals can be found in the Care Plan section)  Acute Rehab PT Goals Patient Stated Goal: to go home PT Goal Formulation: With patient/family Time For Goal Achievement: 12/30/16 Potential to Achieve Goals: Good    Frequency 7X/week   Barriers to discharge        Co-evaluation               End of Session   Activity  Tolerance: Patient tolerated treatment well Patient left: in chair;with call bell/phone within reach;with family/visitor present Nurse Communication: Mobility status PT Visit Diagnosis: Unsteadiness on feet (R26.81);Difficulty in walking, not elsewhere classified (R26.2);Muscle weakness (generalized) (M62.81)         Time: 1429-1500 PT Time Calculation (min) (ACUTE ONLY): 31 min   Charges:   PT Evaluation $PT Eval Low Complexity: 1 Procedure PT Treatments $Gait Training: 8-22 mins   PT G Codes:         Rada HayHill, Irma Delancey Elizabeth 12/27/2016, 6:02 PM Blanchard KelchKaren Aedin Jeansonne PT [phone]

## 2016-12-27 NOTE — Progress Notes (Signed)
CPAP rolled into patient room for oncoming therapist. Patients home hosing and mask are attached. Patients home settings are CPAP 13.5 per patient.

## 2016-12-27 NOTE — Anesthesia Procedure Notes (Signed)
Procedure Name: MAC Date/Time: 12/27/2016 7:15 AM Performed by: Dione Booze Pre-anesthesia Checklist: Patient identified, Emergency Drugs available, Suction available and Patient being monitored Patient Re-evaluated:Patient Re-evaluated prior to inductionOxygen Delivery Method: Simple face mask Placement Confirmation: positive ETCO2

## 2016-12-27 NOTE — Anesthesia Postprocedure Evaluation (Signed)
Anesthesia Post Note  Patient: KASRA MELVIN  Procedure(s) Performed: Procedure(s) (LRB): RIGHT TOTAL KNEE ARTHROPLASTY (Right)  Patient location during evaluation: PACU Anesthesia Type: Spinal and MAC Level of consciousness: oriented and awake and alert Pain management: pain level controlled Vital Signs Assessment: post-procedure vital signs reviewed and stable Respiratory status: spontaneous breathing and respiratory function stable Cardiovascular status: blood pressure returned to baseline and stable Postop Assessment: no headache and no backache Anesthetic complications: no       Last Vitals:  Vitals:   12/27/16 1015 12/27/16 1030  BP: 117/62 109/65  Pulse: 96 97  Resp: (!) 23 13  Temp:      Last Pain:  Vitals:   12/27/16 1015  TempSrc:   PainSc: 0-No pain                 Lynda Rainwater

## 2016-12-27 NOTE — Anesthesia Procedure Notes (Signed)
Spinal  Patient location during procedure: OR Start time: 12/27/2016 7:39 AM End time: 12/27/2016 7:49 AM Staffing Anesthesiologist: Anitra LauthMILLER, Jacia Sickman RAY Performed: anesthesiologist  Preanesthetic Checklist Completed: patient identified, site marked, surgical consent, pre-op evaluation, timeout performed, IV checked, risks and benefits discussed and monitors and equipment checked Spinal Block Patient position: sitting Prep: Betadine Patient monitoring: heart rate, cardiac monitor, continuous pulse ox and blood pressure Approach: midline Location: L3-4 Injection technique: single-shot Needle Needle type: Quincke  Needle gauge: 22 G Needle length: 9 cm Assessment Sensory level: T6

## 2016-12-28 ENCOUNTER — Encounter (HOSPITAL_COMMUNITY): Payer: Self-pay | Admitting: Orthopedic Surgery

## 2016-12-28 DIAGNOSIS — M1711 Unilateral primary osteoarthritis, right knee: Secondary | ICD-10-CM | POA: Diagnosis not present

## 2016-12-28 DIAGNOSIS — Z96651 Presence of right artificial knee joint: Secondary | ICD-10-CM

## 2016-12-28 LAB — CBC
HEMATOCRIT: 35 % — AB (ref 39.0–52.0)
HEMOGLOBIN: 11.8 g/dL — AB (ref 13.0–17.0)
MCH: 31.8 pg (ref 26.0–34.0)
MCHC: 33.7 g/dL (ref 30.0–36.0)
MCV: 94.3 fL (ref 78.0–100.0)
Platelets: 183 10*3/uL (ref 150–400)
RBC: 3.71 MIL/uL — AB (ref 4.22–5.81)
RDW: 15.7 % — ABNORMAL HIGH (ref 11.5–15.5)
WBC: 13.2 10*3/uL — ABNORMAL HIGH (ref 4.0–10.5)

## 2016-12-28 LAB — BASIC METABOLIC PANEL
ANION GAP: 5 (ref 5–15)
BUN: 21 mg/dL — ABNORMAL HIGH (ref 6–20)
CHLORIDE: 106 mmol/L (ref 101–111)
CO2: 24 mmol/L (ref 22–32)
Calcium: 8.4 mg/dL — ABNORMAL LOW (ref 8.9–10.3)
Creatinine, Ser: 1.05 mg/dL (ref 0.61–1.24)
GFR calc Af Amer: >60 mL/min (ref >60)
Glucose, Bld: 208 mg/dL — ABNORMAL HIGH (ref 65–99)
POTASSIUM: 4.3 mmol/L (ref 3.5–5.1)
SODIUM: 135 mmol/L (ref 135–145)

## 2016-12-28 MED ORDER — HYDROCODONE-ACETAMINOPHEN 7.5-325 MG PO TABS: 1.0000 | ORAL_TABLET | ORAL | 0 refills | Status: DC | PRN

## 2016-12-28 NOTE — Progress Notes (Signed)
OT Cancellation Note  Patient Details Name: William PlateJoseph B [name] MRN: [id number] DOB: 07/17/1951   Cancelled Treatment:    Reason Eval/Treat Not Completed: PT screened, no needs identified, will sign off  Crystin Lechtenberg 12/28/2016, 7:23 AM  Marica OtterMaryellen Rita Prom, OTR/L [phone] 12/28/2016

## 2016-12-28 NOTE — Progress Notes (Signed)
Physical Therapy Treatment Patient Details Name: William [name] MRN: [id number] DOB: July 15, 1951 Today's Date: 12/28/2016    History of Present Illness R TKA    PT Comments    POD # 1 pm session Assisted with practicing stairs first one step forward with walker and then up 2 steps with B rails.  Returned to room then performed some TKR TE's following HE handout.  Instructed on proper tech, frq and use of ICE.  Pt ready for D/C to home.    Follow Up Recommendations  Outpatient PT;Supervision/Assistance - 24 hour     Equipment Recommendations  None recommended by PT    Recommendations for Other Services       Precautions / Restrictions Precautions Precautions: Knee Restrictions Weight Bearing Restrictions: No Other Position/Activity Restrictions: WBAT    Mobility  Bed Mobility Overal bed mobility: Needs Assistance Bed Mobility: Supine to Sit     Supine to sit: Min guard;Supervision     General bed mobility comments: OOB in recliner  Transfers Overall transfer level: Needs assistance Equipment used: Rolling walker (2 wheeled) Transfers: Sit to/from Stand Sit to Stand: Min guard         General transfer comment: cues for hand and right leg position  Ambulation/Gait Ambulation/Gait assistance: Min guard;Min assist Ambulation Distance (Feet): 55 Feet Assistive device: Rolling walker (2 wheeled) Gait Pattern/deviations: Step-to pattern;Antalgic Gait velocity: decreased   General Gait Details: s safety with walker   Stairs Stairs: Yes   Stair Management: No rails;Two rails;Step to pattern;Forwards;With walker Number of Stairs: 3 General stair comments: one step forward with walker and 25% VC's on proper tech then up 2 steps forward with B rails all with spouse present for safe handling  Wheelchair Mobility    Modified Rankin (Stroke Patients Only)       Balance                                    Cognition Arousal/Alertness:  Awake/alert Behavior During Therapy: WFL for tasks assessed/performed Overall Cognitive Status: Within Functional Limits for tasks assessed                      Exercises   Total Knee Replacement TE's 10 reps B LE ankle pumps 10 reps towel squeezes 10 reps knee presses 10 reps heel slides  10 reps SAQ's 10 reps SLR's 10 reps ABD Followed by ICE     General Comments        Pertinent Vitals/Pain Pain Assessment: 0-10 Pain Score: 2  Pain Location: R knee Pain Descriptors / Indicators: Discomfort;Operative site guarding Pain Intervention(s): Monitored during session;Repositioned;Ice applied    Home Living                      Prior Function            PT Goals (current goals can now be found in the care plan section) Progress towards PT goals: Progressing toward goals    Frequency    7X/week      PT Plan      Co-evaluation             End of Session Equipment Utilized During Treatment: Gait belt Activity Tolerance: Patient tolerated treatment well Patient left: in chair;with call bell/phone within reach;with family/visitor present Nurse Communication: Mobility status PT Visit Diagnosis: Unsteadiness on feet (R26.81);Difficulty in walking, not elsewhere classified (R26.2);Muscle  weakness (generalized) (M62.81)     Time: 1340-1405 PT Time Calculation (min) (ACUTE ONLY): 25 min  Charges:  $Gait Training: 8-22 mins $Therapeutic Exercise: 8-22 mins                    G Codes:       Felecia ShellingLori Kharis Lapenna  PTA [facility]  Acute  Rehab Pager      [phone]

## 2016-12-28 NOTE — Progress Notes (Signed)
Patient ID: William PlateJoseph B [name], male   DOB: 11/08/1950, 66 y.o.   MRN: [id number] Subjective: 1 Day Post-Op Procedure(s) (LRB): RIGHT TOTAL KNEE ARTHROPLASTY (Right)    Patient reports pain as mild to moderate, doing well up in chair this am  Objective:   VITALS:   Vitals:   12/28/16 0910 12/28/16 1101  BP: 138/64 117/74  Pulse: (!) 103 92  Resp:  18  Temp:  98.7 F (37.1 C)    Neurovascular intact Incision: dressing C/D/I  LABS  Recent Labs  12/28/16 0454  HGB 11.8*  HCT 35.0*  WBC 13.2*  PLT 183     Recent Labs  12/28/16 0454  NA 135  K 4.3  BUN 21*  CREATININE 1.05  GLUCOSE 208*    No results for input(s): LABPT, INR in the last 72 hours.   Assessment/Plan: 1 Day Post-Op Procedure(s) (LRB): RIGHT TOTAL KNEE ARTHROPLASTY (Right)   Up with therapy  D/C to home today after pm therapy RTC in 2 weeks Reviewed goals as he has had his left knee replaced recently

## 2016-12-28 NOTE — Progress Notes (Signed)
CSW consulted for SNF placement. PT has recommended Outpatient therapy at d/c.  CSW signing off.  Cori RazorJamie Korinna Tat LCSW [phone]

## 2016-12-28 NOTE — Progress Notes (Signed)
Physical Therapy Treatment Patient Details Name: William PlateJoseph B [name] MRN: [id number] DOB: 02/08/1951 Today's Date: 12/28/2016    History of Present Illness R TKA    PT Comments    POD # 1 am session Assisted OOB to amb a greater distance in hallway.  Performed TKR TE's followed by ICE.   Follow Up Recommendations  Outpatient PT;Supervision/Assistance - 24 hour     Equipment Recommendations  None recommended by PT    Recommendations for Other Services       Precautions / Restrictions Precautions Precautions: Knee Restrictions Weight Bearing Restrictions: No Other Position/Activity Restrictions: WBAT    Mobility  Bed Mobility Overal bed mobility: Needs Assistance Bed Mobility: Supine to Sit     Supine to sit: Min guard;Supervision     General bed mobility comments: assist with right leg and increased time  Transfers Overall transfer level: Needs assistance Equipment used: Rolling walker (2 wheeled) Transfers: Sit to/from Stand Sit to Stand: Min guard         General transfer comment: cues for hand and right leg position  Ambulation/Gait Ambulation/Gait assistance: Min guard;Min assist Ambulation Distance (Feet): 42 Feet Assistive device: Rolling walker (2 wheeled) Gait Pattern/deviations: Step-to pattern;Antalgic Gait velocity: decreased   General Gait Details: s safety with walker   Stairs            Wheelchair Mobility    Modified Rankin (Stroke Patients Only)       Balance                                    Cognition Arousal/Alertness: Awake/alert Behavior During Therapy: WFL for tasks assessed/performed Overall Cognitive Status: Within Functional Limits for tasks assessed                      Exercises   Total Knee Replacement TE's 10 reps B LE ankle pumps 10 reps towel squeezes 10 reps knee presses 10 reps heel slides  10 reps SAQ's 10 reps SLR's 10 reps ABD Followed by ICE     General Comments         Pertinent Vitals/Pain Pain Assessment: 0-10 Pain Score: 2  Pain Location: R knee Pain Descriptors / Indicators: Discomfort;Operative site guarding Pain Intervention(s): Monitored during session;Repositioned;Ice applied    Home Living                      Prior Function            PT Goals (current goals can now be found in the care plan section) Progress towards PT goals: Progressing toward goals    Frequency    7X/week      PT Plan      Co-evaluation             End of Session Equipment Utilized During Treatment: Gait belt Activity Tolerance: Patient tolerated treatment well Patient left: in chair;with call bell/phone within reach;with family/visitor present Nurse Communication: Mobility status PT Visit Diagnosis: Unsteadiness on feet (R26.81);Difficulty in walking, not elsewhere classified (R26.2);Muscle weakness (generalized) (M62.81)     Time: 1000-1040 PT Time Calculation (min) (ACUTE ONLY): 40 min  Charges:  $Gait Training: 8-22 mins $Therapeutic Exercise: 8-22 mins $Therapeutic Activity: 8-22 mins                    G Codes:       Felecia ShellingLori Mustafa Potts  PTA  [facility]  Acute  Rehab Pager      [phone]

## 2016-12-28 NOTE — Care Management Note (Signed)
Case Management Note  Patient Details  Name: NEILS SIRACUSA MRN: [id number] Date of Birth: 10/29/1950  Subjective/Objective:                  TKA R Action/Plan: Discharge planning Expected Discharge Date:  12/28/16               Expected Discharge Plan:  Home/Self Care  In-House Referral:     Discharge planning Services  CM Consult  Post Acute Care Choice:  NA Choice offered to:  Patient  DME Arranged:  N/A DME Agency:  NA  HH Arranged:  NA HH Agency:  NA  Status of Service:  Completed, signed off  If discussed at [place] of Stay Meetings, dates discussed:    Additional Comments: CM met with pt in room to assess for home needs. Pt declines [facility] as he states he is going straight to outpt PT.  Pt states he has all DME needed at home. No other CM needs were communicated. Dellie Catholic, RN 12/28/2016, 2:37 PM

## 2016-12-28 NOTE — Discharge Instructions (Signed)

## 2017-01-03 NOTE — Discharge Summary (Signed)
Physician Discharge Summary  Patient ID: ANQUAN AZZARELLO MRN: [id number] DOB/AGE: 02-27-1951 66 y.o.  Admit date: 12/27/2016 Discharge date: 12/28/2016   Procedures:  Procedure(s) (LRB): RIGHT TOTAL KNEE ARTHROPLASTY (Right)  Attending Physician:  Dr. [name]   Admission Diagnoses:   Right knee primary OA / pain  Discharge Diagnoses:  Active Problems:   S/P total knee arthroplasty, right   Total knee replacement status, right  Past Medical History:  Diagnosis Date  . Anxiety   . Arthritis   . COPD (chronic obstructive pulmonary disease) (HCC)   . Depression   . Diverticulitis   . Essential hypertension 11/20/2014  . GERD (gastroesophageal reflux disease)   . H/O cardiovascular stress test    06/20/14 Nuclear stress test [facility]): Rest and stress images are WNL. No significant reversible ischemia or fixed scar. Gated LVEF 55%. NL LV wall motion    . Headache    hx of migraines  . History of kidney stones   . History of pneumonia   . History of sepsis January 2016  . Hypertension   . Hypothyroidism 11/20/2014  . Pneumonia    hx of  . Protein-calorie malnutrition (HCC) 11/20/2014  . Renal disorder    kidney stones  . Shortness of breath dyspnea   . Sleep apnea    nasal 13.5 settings  . Thyroid disease   . Wears glasses     HPI:     Turner Baillie, 66 y.o. male, has a history of pain and functional disability in the right knee due to arthritis and has failed non-surgical conservative treatments for greater than 12 weeks to include NSAID's and/or analgesics, corticosteriod injections, viscosupplementation injections, use of assistive devices and activity modification.  Onset of symptoms was gradual, starting  years ago with gradually worsening course since that time. The patient noted prior procedures on the knee to include  arthroplasty on the left knee per Dr. Charlann Boxer on 11/23/2016.  Patient currently rates pain in the right knee(s) at 8 out of 10 with  activity. Patient has worsening of pain with activity and weight bearing, pain that interferes with activities of daily living, pain with passive range of motion, crepitus and joint swelling.  Patient has evidence of periarticular osteophytes and joint space narrowing by imaging studies.  There is no active infection.  Risks, benefits and expectations were discussed with the patient.  Risks including but not limited to the risk of anesthesia, blood clots, nerve damage, blood vessel damage, failure of the prosthesis, infection and up to and including death.  Patient understand the risks, benefits and expectations and wishes to proceed with surgery.  PCP: Helayne Seminole, PA   Discharged Condition: good  Hospital Course:  Patient underwent the above stated procedure on 12/27/2016. Patient tolerated the procedure well and brought to the recovery room in good condition and subsequently to the floor.  POD #1 BP: 117/74 ; Pulse: 92 ; Temp: 98.7 F (37.1 C) ; Resp: 18 Patient reports pain as mild to moderate, doing well up in chair this am. Neurovascular intact and incision: dressing C/D/I.  LABS  Basename    HGB     11.8  HCT     35.0    Discharge Exam: General appearance: alert, cooperative and no distress Extremities: Homans sign is negative, no sign of DVT, no edema, redness or tenderness in the calves or thighs and no ulcers, gangrene or trophic changes  Disposition: Home with follow up in 2 weeks  Follow-up Information    Shelda PalLIN,Khalon Cansler D, MD Follow up in 2 week(s).   Specialty:  Orthopedic Surgery Contact information: [address] [place] [address] [phone]           Discharge Instructions    Call MD / Call 911    Complete by:  As directed    If you experience chest pain or shortness of breath, CALL 911 and be transported to the hospital emergency room.  If you develope a fever above 101 F, pus (white drainage) or increased drainage or redness at the  wound, or calf pain, call your surgeon's office.   Constipation Prevention    Complete by:  As directed    Drink plenty of fluids.  Prune juice may be helpful.  You may use a stool softener, such as Colace (over the counter) 100 mg twice a day.  Use MiraLax (over the counter) for constipation as needed.   Diet - low sodium heart healthy    Complete by:  As directed    Discharge instructions    Complete by:  As directed    See print out from hospital   Driving restrictions    Complete by:  As directed    No driving until further directed through office   Increase activity slowly as tolerated    Complete by:  As directed       Allergies as of 12/28/2016      Reactions   Aloprim [allopurinol] Other (See Comments)   "MUSCLE SEIZED UP"   Simvastatin Other (See Comments)   Muscle aches.   Penicillins Rash   Has patient had a PCN reaction causing immediate rash, facial/tongue/throat swelling, SOB or lightheadedness with hypotension: No Has patient had a PCN reaction causing severe rash involving mucus membranes or skin necrosis: No Has patient had a PCN reaction that required hospitalization No Has patient had a PCN reaction occurring within the last 10 years: Yes If all of the above answers are "NO", then may proceed with Cephalosporin use.      Medication List    STOP taking these medications   OVER THE COUNTER MEDICATION     TAKE these medications   aspirin 81 MG chewable tablet Chew 1 tablet (81 mg total) by mouth 2 (two) times daily. Take for 4 weeks, then resume regular dose.   celecoxib 200 MG capsule Commonly known as:  CELEBREX Take 1 capsule (200 mg total) by mouth 2 (two) times daily.   cholecalciferol 1000 units tablet Commonly known as:  VITAMIN D Take 1,000 Units by mouth daily.   docusate sodium 100 MG capsule Commonly known as:  COLACE Take 1 capsule (100 mg total) by mouth 2 (two) times daily.   ferrous sulfate 325 (65 FE) MG tablet Commonly known as:   FERROUSUL Take 1 tablet (325 mg total) by mouth 3 (three) times daily with meals.   FISH OIL CONCENTRATE 300 MG Caps Take 300 mg by mouth daily.   HYDROcodone-acetaminophen 7.5-325 MG tablet Commonly known as:  NORCO Take 1-2 tablets by mouth every 4 (four) hours as needed for moderate pain or severe pain. What changed:  Another medication with the same name was added. Make sure you understand how and when to take each.   HYDROcodone-acetaminophen 7.5-325 MG tablet Commonly known as:  NORCO Take 1-2 tablets by mouth every 4 (four) hours as needed (breakthrough pain). What changed:  You were already taking a medication with the same name, and this prescription  was added. Make sure you understand how and when to take each.   hydrOXYzine 50 MG capsule Commonly known as:  VISTARIL Take 50 mg by mouth at bedtime.   levothyroxine 300 MCG tablet Commonly known as:  SYNTHROID, LEVOTHROID Take 300 mcg by mouth daily before breakfast.   lisinopril 40 MG tablet Commonly known as:  PRINIVIL,ZESTRIL Take 40 mg by mouth daily.   metoprolol tartrate 25 MG tablet Commonly known as:  LOPRESSOR Take 1 tablet (25 mg total) by mouth 2 (two) times daily. What changed:  how much to take  when to take this   pantoprazole 40 MG tablet Commonly known as:  PROTONIX Take 1 tablet (40 mg total) by mouth daily. What changed:  when to take this   polyethylene glycol packet Commonly known as:  MIRALAX / GLYCOLAX Take 17 g by mouth 2 (two) times daily.   tiZANidine 4 MG capsule Commonly known as:  ZANAFLEX Take 1 capsule (4 mg total) by mouth 3 (three) times daily as needed for muscle spasms.   vitamin E 400 UNIT capsule Take 400 Units by mouth daily.        Signed: Anastasio Auerbach. Katelyne Galster   PA-C  01/03/2017, 1:40 PM

## 2017-01-08 NOTE — Progress Notes (Deleted)
   12/27/16 1801  PT Time Calculation  PT Start Time (ACUTE ONLY) 1429  PT Stop Time (ACUTE ONLY) 1500  PT Time Calculation (min) (ACUTE ONLY) 31 min  PT G-Codes **NOT FOR INPATIENT CLASS**  Functional Assessment Tool Used Clinical judgement  Functional Limitation Mobility: Walking and moving around  Mobility: Walking and Moving Around Current Status (W2956(G8978) CJ  Mobility: Walking and Moving Around Goal Status (O1308(G8979) CI  PT General Charges  $$ ACUTE PT VISIT 1 Procedure  PT Evaluation  $PT Eval Low Complexity 1 Procedure  PT Treatments  $Gait Training 8-22 mins  Blanchard KelchKaren Myrtie Leuthold PT [phone]   12/27/16 1801  PT Time Calculation  PT Start Time (ACUTE ONLY) 1429  PT Stop Time (ACUTE ONLY) 1500  PT Time Calculation (min) (ACUTE ONLY) 31 min  PT G-Codes **NOT FOR INPATIENT CLASS**  Functional Assessment Tool Used Clinical judgement  Functional Limitation Mobility: Walking and moving around  Mobility: Walking and Moving Around Current Status (G2952(G8978) CJ  Mobility: Walking and Moving Around Goal Status (W4132(G8979) CI  PT General Charges  $$ ACUTE PT VISIT 1 Procedure  PT Evaluation  $PT Eval Low Complexity 1 Procedure  PT Treatments  $Gait Training 8-22 mins

## 2017-01-08 NOTE — Progress Notes (Signed)
   12/27/16 1801  PT Time Calculation  PT Start Time (ACUTE ONLY) 1429  PT Stop Time (ACUTE ONLY) 1500  PT Time Calculation (min) (ACUTE ONLY) 31 min  PT G-Codes **NOT FOR INPATIENT CLASS**  Functional Assessment Tool Used Clinical judgement  Functional Limitation Mobility: Walking and moving around  Mobility: Walking and Moving Around Current Status (B1478(G8978) CJ  Mobility: Walking and Moving Around Goal Status (G9562(G8979) CI  PT General Charges  $$ ACUTE PT VISIT 1 Procedure  PT Evaluation  $PT Eval Low Complexity 1 Procedure  PT Treatments  $Gait Training 8-22 mins  MaltaKaren Sisto Granillo PT [phone]

## 2017-07-21 ENCOUNTER — Encounter: Payer: 59 | Admitting: Vascular Surgery

## 2017-07-25 ENCOUNTER — Ambulatory Visit (INDEPENDENT_AMBULATORY_CARE_PROVIDER_SITE_OTHER): Payer: 59 | Admitting: Vascular Surgery

## 2017-07-25 ENCOUNTER — Encounter: Payer: Self-pay | Admitting: Vascular Surgery

## 2017-07-25 VITALS — BP 103/73 | HR 83 | Temp 97.0°F | Resp 18 | Ht 69.0 in | Wt 210.7 lb

## 2017-07-25 DIAGNOSIS — I779 Disorder of arteries and arterioles, unspecified: Secondary | ICD-10-CM | POA: Insufficient documentation

## 2017-07-25 DIAGNOSIS — R0989 Other specified symptoms and signs involving the circulatory and respiratory systems: Secondary | ICD-10-CM | POA: Diagnosis not present

## 2017-07-25 DIAGNOSIS — I6523 Occlusion and stenosis of bilateral carotid arteries: Secondary | ICD-10-CM | POA: Diagnosis not present

## 2017-07-25 DIAGNOSIS — I739 Peripheral vascular disease, unspecified: Secondary | ICD-10-CM

## 2017-07-25 NOTE — Progress Notes (Signed)
New Carotid Patient  Requested by:  Darryl Lent, PA-C [address] [place], [place] [address]  Reason for consultation: bilateral carotid stenosis   History of Present Illness   William [name] is a 66 y.o. (1951-01-13) male who presents with chief complaint: some disease in neck.  Patient is not certain if his PCP heard a PCP as he has never had a CVA or TIA.  Previous carotid studies demonstrated: RICA 20-49% stenosis, LICA 50-80% stenosis.  Patient has no history of TIA or stroke symptom.  The patient has never had amaurosis fugax or monocular blindness.  The patient has never had facial drooping or hemiplegia.  The patient has never had receptive or expressive aphasia.     The patient's risks factors for carotid disease include: HLD, HTN, insulin resistance and prior smoking  Past Medical History:  Diagnosis Date  . Anxiety   . Arthritis   . COPD (chronic obstructive pulmonary disease) (HCC)   . Depression   . Diverticulitis   . Essential hypertension 11/20/2014  . GERD (gastroesophageal reflux disease)   . H/O cardiovascular stress test    06/20/14 Nuclear stress test [facility]): Rest and stress images are WNL. No significant reversible ischemia or fixed scar. Gated LVEF 55%. NL LV wall motion    . Headache    hx of migraines  . History of kidney stones   . History of pneumonia   . History of sepsis January 2016  . Hypertension   . Hypothyroidism 11/20/2014  . Pneumonia    hx of  . Protein-calorie malnutrition (HCC) 11/20/2014  . Renal disorder    kidney stones  . Shortness of breath dyspnea   . Sleep apnea    nasal 13.5 settings  . Thyroid disease   . Wears glasses     Past Surgical History:  Procedure Laterality Date  . COLONOSCOPY    . COLOSTOMY N/A 10/28/2014   Procedure: COLOSTOMY;  Surgeon: [name], MD;  Location: [facility];  Service: General;  Laterality: N/A;  . COLOSTOMY TAKEDOWN  06/19/2015  . COLOSTOMY TAKEDOWN N/A 06/19/2015   Procedure: COLOSTOMY TAKEDOWN;  Surgeon: [name], MD;  Location: [facility] OR;  Service: General;  Laterality: N/A;  . FINGER AMPUTATION Left    index finger  . JOINT REPLACEMENT     Left knee arthroscopy. in prop for right knee replacement  . LAPAROTOMY N/A 10/28/2014   Procedure: EXPLORATORY LAPAROTOMY;  Surgeon: [name], MD;  Location: [facility];  Service: General;  Laterality: N/A;  . PARTIAL COLECTOMY N/A 10/28/2014   Procedure: PARTIAL COLECTOMY Sigmoid;  Surgeon: [name], MD;  Location: [facility];  Service: General;  Laterality: N/A;  . TOTAL KNEE ARTHROPLASTY Left 11/23/2016   Procedure: LEFT TOTAL KNEE ARTHROPLASTY;  Surgeon: [name], MD;  Location: [facility];  Service: Orthopedics;  Laterality: Left;  Adductor Block  . TOTAL KNEE ARTHROPLASTY Right 12/27/2016   Procedure: RIGHT TOTAL KNEE ARTHROPLASTY;  Surgeon: [name], MD;  Location: [facility];  Service: Orthopedics;  Laterality: Right;  Requests 90 mins  . TRACHEOSTOMY TUBE PLACEMENT N/A 11/08/2014   Procedure: TRACHEOSTOMY;  Surgeon: [name], MD;  Location: [facility];  Service: ENT;  Laterality: N/A;    Social History   Social History  . Marital status: Married    Spouse name: N/A  . Number of children: N/A  . Years of education: N/A   Occupational History  . Not on file.  Social History Main Topics  . Smoking status: Former Smoker    Quit date: 10/18/2004  . Smokeless tobacco: Former Neurosurgeon    Quit date: 07/25/2004     Comment: only 3 months for smokeless  . Alcohol use 0.6 oz/week    1 Glasses of wine per week     Comment: occassionally   . Drug use: No  . Sexual activity: Yes   Other Topics Concern  . Not on file   Social History Narrative  . No narrative on file    Family History  Problem Relation Age of Onset  . Colon cancer Neg Hx     Current Outpatient Prescriptions  Medication Sig Dispense Refill  . aspirin 81 MG chewable tablet Chew 1 tablet (81 mg total) by mouth 2 (two) times daily. Take  for 4 weeks, then resume regular dose. 60 tablet 0  . celecoxib (CELEBREX) 200 MG capsule Take 1 capsule (200 mg total) by mouth 2 (two) times daily. 60 capsule 0  . cholecalciferol (VITAMIN D) 1000 UNITS tablet Take 1,000 Units by mouth daily.    . ferrous sulfate (FERROUSUL) 325 (65 FE) MG tablet Take 1 tablet (325 mg total) by mouth 3 (three) times daily with meals.    . hydrOXYzine (VISTARIL) 50 MG capsule Take 50 mg by mouth at bedtime.  0  . levothyroxine (SYNTHROID, LEVOTHROID) 300 MCG tablet Take 300 mcg by mouth daily before breakfast.    . lisinopril (PRINIVIL,ZESTRIL) 40 MG tablet Take 40 mg by mouth daily.  0  . metoprolol tartrate (LOPRESSOR) 25 MG tablet Take 1 tablet (25 mg total) by mouth 2 (two) times daily. (Patient taking differently: Take 12.5 mg by mouth daily. ) 60 tablet 1  . Omega-3 Fatty Acids (FISH OIL CONCENTRATE) 300 MG CAPS Take 300 mg by mouth daily.    . pantoprazole (PROTONIX) 40 MG tablet Take 1 tablet (40 mg total) by mouth daily. (Patient taking differently: Take 40 mg by mouth at bedtime. ) 30 tablet 1  . polyethylene glycol (MIRALAX / GLYCOLAX) packet Take 17 g by mouth 2 (two) times daily. 14 each 0  . tiZANidine (ZANAFLEX) 4 MG capsule Take 1 capsule (4 mg total) by mouth 3 (three) times daily as needed for muscle spasms. 40 capsule 0  . vitamin E 400 UNIT capsule Take 400 Units by mouth daily.     Marland Kitchen docusate sodium (COLACE) 100 MG capsule Take 1 capsule (100 mg total) by mouth 2 (two) times daily. (Patient not taking: Reported on 07/25/2017) 10 capsule 0  . HYDROcodone-acetaminophen (NORCO) 7.5-325 MG tablet Take 1-2 tablets by mouth every 4 (four) hours as needed for moderate pain or severe pain. (Patient not taking: Reported on 07/25/2017) 60 tablet 0  . HYDROcodone-acetaminophen (NORCO) 7.5-325 MG tablet Take 1-2 tablets by mouth every 4 (four) hours as needed (breakthrough pain). (Patient not taking: Reported on 07/25/2017) 50 tablet 0   No current  facility-administered medications for this visit.     Allergies  Allergen Reactions  . Aloprim [Allopurinol] Other (See Comments)    "MUSCLE SEIZED UP"  . Simvastatin Other (See Comments)    Muscle aches.  . Penicillins Rash    Has patient had a PCN reaction causing immediate rash, facial/tongue/throat swelling, SOB or lightheadedness with hypotension: No Has patient had a PCN reaction causing severe rash involving mucus membranes or skin necrosis: No Has patient had a PCN reaction that required hospitalization No Has patient had a PCN reaction occurring within  the last 10 years: Yes If all of the above answers are "NO", then may proceed with Cephalosporin use.     REVIEW OF SYSTEMS (negative unless checked):   Cardiac:   Chest pain or chest pressure?  Shortness of breath upon activity?  Shortness of breath when lying flat?  Irregular heart rhythm?  Vascular:   Pain in calf, thigh, or hip brought on by walking?  Pain in feet at night that wakes you up from your sleep?  Blood clot in your veins?  Leg swelling?  Pulmonary:   Oxygen at home?  Productive cough?  Wheezing?  Neurologic:   Sudden weakness in arms or legs?  Sudden numbness in arms or legs?  Sudden onset of difficult speaking or slurred speech?  Temporary loss of vision in one eye?  Problems with dizziness?  Gastrointestinal:   Blood in stool?  Vomited blood?  Genitourinary:   Burning when urinating?  Blood in urine?  Psychiatric:   Major depression  Hematologic:   Bleeding problems?  Problems with blood clotting?  Dermatologic:   Rashes or ulcers?  Constitutional:   Fever or chills?  Ear/Nose/Throat:   Change in hearing?  Nose bleeds?  Sore throat?  Musculoskeletal:   Back pain?  Joint pain?  Muscle pain?   For VQI Use Only   PRE-ADM LIVING Home  AMB STATUS Ambulatory  CAD Sx None  PRIOR CHF None  STRESS TEST No      Physical Examination      Vitals:   07/25/17 0930 07/25/17 0932  BP: 104/69 103/73  Pulse: 83   Resp: 18   Temp: (!) 97 F (36.1 C)   TempSrc: Oral   SpO2: 97%   Weight: 210 lb 11.2 oz (95.6 kg)   Height:  (1.753 m)    Body mass index is 31.11 kg/m.  General Alert, O x 3, Obese, NAD  Head [place]/AT,    Ear/Nose/ Throat Hearing grossly intact, nares without erythema or drainage, oropharynx without Erythema or Exudate, Mallampati score: 3,   Eyes PERRLA, EOMI,    Neck Supple, mid-line trachea,    Pulmonary Sym exp, good B air movt, CTA B  Cardiac RRR, Nl S1, S2, no Murmurs, No rubs, No S3,S4  Vascular Vessel Right Left  Radial Palpable Palpable  Brachial Palpable Palpable  Carotid Palpable, No Bruit Palpable, No Bruit  Aorta Not palpable N/A  Femoral Palpable Palpable  Popliteal Not palpable Not palpable  PT Faintly palpable Faintly palpable  DP Not palpable Not palpable    Gastro- intestinal soft, non-distended, non-tender to palpation, No guarding or rebound, no HSM, no masses, no CVAT B, No palpable prominent aortic pulse,    Musculo- skeletal M/S 5/5 throughout  , Extremities without ischemic changes except well healed L 2nd finger amp  , Non-pitting edema present: 1+ B, No visible varicosities , No Lipodermatosclerosis present, B TKR incisions c/d/i  Neurologic Cranial nerves 2-12 intact , Pain and light touch intact in extremities , Motor exam as listed above  Psychiatric Judgement intact, Mood & affect appropriate for pt's clinical situation  Dermatologic See M/S exam for extremity exam, No rashes otherwise noted  Lymphatic  Palpable lymph nodes: None     Non-invasive Vascular Imaging   Outside B Carotid Duplex (05/26/17):  R ICA stenosis:  20-49% L ICA Stenosis: 50-80%  Outside Studies/Documentation   4 pages of outside documents were reviewed including: outpatient PCP chart, outpt B carotid duplex.   Medical Decision Making  William [name]  is a 66 y.o. male who presents with: B asx ICA stenosis <80%.   Outside B carotid duplex does not meet ICAVL standards so pt should get another study done.  There is no evidence of increase PSV in L ICA so I'm not certain why a 50-80% was read.  Will get BLE ABI at that time also as I can't feel this patient's pulse easily due to edema  Based on the patient's vascular studies and examination, I have offered the patient: B carotid duplex and BLE ABI in 6 months.. I discussed in depth with the patient the nature of atherosclerosis, and emphasized the importance of maximal medical management including strict control of blood pressure, blood glucose, and lipid levels, obtaining regular exercise, antiplatelet agents, and cessation of smoking.   The patient is currently not on a statin due to reported allergy.  The patient is currently on an anti-platelet: ASA.  The patient is aware that without maximal medical management the underlying atherosclerotic disease process will progress, limiting the benefit of any interventions.  Thank you for allowing [place] to participate in this patient's care.   Leonides Sake, MD, FACS [facility] Office: [phone] Pager: [phone]  07/25/2017, 10:05 AM

## 2017-07-27 ENCOUNTER — Encounter: Payer: Self-pay | Admitting: Physician Assistant

## 2017-07-29 NOTE — Addendum Note (Signed)
Addended by: Burton Apley A on: 07/29/2017 10:08 AM   Modules accepted: Orders

## 2017-11-30 ENCOUNTER — Encounter: Payer: Self-pay | Admitting: Gastroenterology

## 2017-11-30 ENCOUNTER — Ambulatory Visit: Payer: 59 | Admitting: Gastroenterology

## 2017-11-30 VITALS — BP 130/80 | HR 72 | Ht 68.75 in | Wt 280.4 lb

## 2017-11-30 DIAGNOSIS — Z8719 Personal history of other diseases of the digestive system: Secondary | ICD-10-CM

## 2017-11-30 DIAGNOSIS — R933 Abnormal findings on diagnostic imaging of other parts of digestive tract: Secondary | ICD-10-CM | POA: Diagnosis not present

## 2017-11-30 NOTE — Patient Instructions (Signed)
If you are age 65 or older, your body mass index should be between 23-30. Your Body mass index is 41.71 kg/m. If this is out of the aforementioned range listed, please consider follow up with your Primary Care Provider.  If you are age 65 or younger, your body mass index should be between 19-25. Your Body mass index is 41.71 kg/m. If this is out of the aformentioned range listed, please consider follow up with your Primary Care Provider.   Follow up as needed.  Thank you for choosing [facility]  Dr Amada JupiterHenry Danis III

## 2017-11-30 NOTE — Progress Notes (Signed)
[facility] Consult Note:  History: William [name] 11/30/2017  Referring physician: Darryl Lent, PA [facility])  Reason for consult/chief complaint: abnormal CT   Subjective  HPI:  This is a 67 year old man previously seen by Dr. Arlyce Dice who is referred for evaluation of abnormal CT scan finding.  He saw Dr. Arlyce Dice for a routine colonoscopy in July 2016, performed via ostomy prior to colostomy takedown.  The patient had undergone partial left colectomy with ostomy formation in early 2016 for perforated diverticulitis.  The 2016 colonoscopy was normal, including the pouch.Colostomy takedown 06/2015  He recently underwent CT abdomen and pelvis for microscopic hematuria and findings of bilateral kidney stones on ultrasound.  This can reported narrowing of the lumen in the left colon at the site of prior surgery.  It was described as 9 mm in diameter over a length of 3-1/2 cm.  There was no surrounding inflammation or masslike description. William [name] denies abdominal pain or constipation or rectal bleeding.  His appetite is good and his weight stable.  He has no digestive symptoms. Upcoming lithotripsy for his kidney stones. ROS:  Review of Systems He denies chest pain or dysuria.  He has chronic dyspnea with exertion due to COPD.  Past Medical History: Past Medical History:  Diagnosis Date  . Abnormal PFTs (pulmonary function tests)   . Abnormal stress test   . Acute arthropathy   . Anxiety   . Arthritis   . Cardiomyopathy (HCC)   . Carotid stenosis   . Chronic airway obstruction, mixed type (HCC)   . COPD (chronic obstructive pulmonary disease) (HCC)   . DDD (degenerative disc disease), lumbar   . Depression   . Diverticulitis   . Essential hypertension 11/20/2014  . GERD (gastroesophageal reflux disease)   . Goiter   . Gout   . H/O cardiovascular stress test    06/20/14 Nuclear stress test [facility]): Rest and stress images are  WNL. No significant reversible ischemia or fixed scar. Gated LVEF 55%. NL LV wall motion    . Headache    hx of migraines  . History of kidney stones   . History of pneumonia   . History of sepsis January 2016  . Hyperlipidemia   . Hypersomnia   . Hypertension   . Hypothyroidism 11/20/2014  . Lumbar spondylosis   . Multiple lung nodules    per CT 10-25-2017  . Nephrolithiasis    per CT 10-25-2017  . OSA (obstructive sleep apnea)    on CPAP  . Osteopenia   . Palpitations   . Pneumonia    hx of  . Pre-diabetes   . Protein-calorie malnutrition (HCC) 11/20/2014  . PVD (peripheral vascular disease) (HCC)   . Renal disorder    kidney stones  . Shortness of breath dyspnea   . Sleep apnea    nasal 13.5 settings  . Tachycardia   . Thyroid disease   . Ventral hernia   . Vitamin D deficiency   . Wears glasses      Past Surgical History: Past Surgical History:  Procedure Laterality Date  . COLONOSCOPY    . COLOSTOMY N/A 10/28/2014   Procedure: COLOSTOMY;  Surgeon: [name], MD;  Location: [facility];  Service: General;  Laterality: N/A;  . COLOSTOMY TAKEDOWN  06/19/2015  . COLOSTOMY TAKEDOWN N/A 06/19/2015   Procedure: COLOSTOMY TAKEDOWN;  Surgeon: [name], MD;  Location: [facility] OR;  Service: General;  Laterality: N/A;  . FINGER AMPUTATION  Left    index finger  . JOINT REPLACEMENT     Left knee arthroscopy. in prop for right knee replacement  . LAPAROTOMY N/A 10/28/2014   Procedure: EXPLORATORY LAPAROTOMY;  Surgeon: Chevis PrettyPaul Toth III, MD;  Location: [facility];  Service: General;  Laterality: N/A;  . PARTIAL COLECTOMY N/A 10/28/2014   Procedure: PARTIAL COLECTOMY Sigmoid;  Surgeon: Chevis PrettyPaul Toth III, MD;  Location: [facility];  Service: General;  Laterality: N/A;  . TOTAL KNEE ARTHROPLASTY Left 11/23/2016   Procedure: LEFT TOTAL KNEE ARTHROPLASTY;  Surgeon: Durene RomansMatthew Olin, MD;  Location: [facility];  Service: Orthopedics;  Laterality: Left;  Adductor Block  . TOTAL KNEE ARTHROPLASTY Right 12/27/2016    Procedure: RIGHT TOTAL KNEE ARTHROPLASTY;  Surgeon: Durene RomansMatthew Olin, MD;  Location: [facility];  Service: Orthopedics;  Laterality: Right;  Requests 90 mins  . TRACHEOSTOMY TUBE PLACEMENT N/A 11/08/2014   Procedure: TRACHEOSTOMY;  Surgeon: Christia Readingwight Bates, MD;  Location: [facility];  Service: ENT;  Laterality: N/A;     Family History: Family History  Problem Relation Age of Onset  . Hyperlipidemia Mother   . Irregular heart beat Mother   . Colon cancer Neg Hx     Social History: Social History   Socioeconomic History  . Marital status: Married    Spouse name: None  . Number of children: None  . Years of education: None  . Highest education level: None  Social Needs  . Financial resource strain: None  . Food insecurity - worry: None  . Food insecurity - inability: None  . Transportation needs - medical: None  . Transportation needs - non-medical: None  Occupational History  . Occupation: [employer]  Tobacco Use  . Smoking status: Former Smoker    Last attempt to quit: 10/18/2004    Years since quitting: 13.1  . Smokeless tobacco: Former NeurosurgeonUser    Quit date: 07/25/2004  . Tobacco comment: only 3 months for smokeless  Substance and Sexual Activity  . Alcohol use: Yes    Alcohol/week: 0.6 oz    Types: 1 Glasses of wine per week    Comment:  3-4 x a week   . Drug use: No  . Sexual activity: Yes  Other Topics Concern  . None  Social History Narrative  . None    Allergies: Allergies  Allergen Reactions  . Aloprim [Allopurinol] Other (See Comments)    "MUSCLE SEIZED UP"  . Simvastatin Other (See Comments)    Muscle aches.  . Penicillins Rash    Has patient had a PCN reaction causing immediate rash, facial/tongue/throat swelling, SOB or lightheadedness with hypotension: No Has patient had a PCN reaction causing severe rash involving mucus membranes or skin necrosis: No Has patient had a PCN reaction that required hospitalization No Has patient had a PCN reaction occurring within the  last 10 years: Yes If all of the above answers are "NO", then may proceed with Cephalosporin use.     Outpatient Meds: Current Outpatient Medications  Medication Sig Dispense Refill  . aspirin 81 MG chewable tablet Chew 1 tablet (81 mg total) by mouth 2 (two) times daily. Take for 4 weeks, then resume regular dose. 60 tablet 0  . cholecalciferol (VITAMIN D) 1000 UNITS tablet Take 1,000 Units by mouth daily.    Marland Kitchen. ezetimibe (ZETIA) 10 MG tablet Take 10 mg by mouth at bedtime.    . hydrOXYzine (VISTARIL) 50 MG capsule Take 50 mg by mouth at bedtime.  0  . levothyroxine (SYNTHROID, LEVOTHROID) 300 MCG  tablet Take 300 mcg by mouth daily before breakfast.    . metoprolol tartrate (LOPRESSOR) 25 MG tablet Take 1 tablet (25 mg total) by mouth 2 (two) times daily. (Patient taking differently: Take 12.5 mg by mouth daily. ) 60 tablet 1  . Omega-3 Fatty Acids (FISH OIL CONCENTRATE) 300 MG CAPS Take 300 mg by mouth daily.    . vitamin E 400 UNIT capsule Take 400 Units by mouth daily.      No current facility-administered medications for this visit.       ___________________________________________________________________ Objective   Exam:  BP 130/80 (BP Location: Left Arm, Patient Position: Sitting, Cuff Size: Normal)   Pulse 72   Ht 5' 8.75" (1.746 m) Comment: height measured without shoes  Wt 280 lb 6 oz (127.2 kg)   BMI 41.71 kg/m    General: this is a(n) morbidly obese and otherwise well-appearing man  Eyes: sclera anicteric, no redness  ENT: oral mucosa moist without lesions, no cervical or supraclavicular lymphadenopathy, good dentition  CV: RRR without murmur, S1/S2, no JVD, no peripheral edema  Resp: clear to auscultation bilaterally, normal RR and effort noted  GI: soft, no tenderness, with active bowel sounds. No guarding or palpable organomegaly noted.  Midline and left upper abdomen scars from his laparotomy and ostomy  Skin; warm and dry, no rash or jaundice  noted  Neuro: awake, alert and oriented x 3. Normal gross motor function and fluent speech  Radiologic Studies:  Report of CT abdomen and pelvis as noted above.  Outside imaging center, cannot review the images.  Assessment: Encounter Diagnoses  Name Primary?  . Abnormal finding on GI tract imaging Yes  . History of diverticulitis     This appears to be a benign incidental finding and he does not have constipation or symptoms of obstruction.  I also suspect that this area of the lumen might seem more narrow than it truly is due to artifact of peristalsis on imaging.  Nevertheless, I do not think it needs further workup at this time.  He will see me as needed, and his next routine colonoscopy for screening should be in 2026.   Thank you for the courtesy of this consult.  Please call me with any questions or concerns.  Charlie Pitter III  CC: Darryl Lent, PA

## 2018-01-31 NOTE — Progress Notes (Deleted)
Established Carotid Patient   History of Present Illness   Pilar PlateJoseph B Dino is a 67 y.o. (12/07/1950) male who presents with chief complaint: ***.  Previous carotid studies demonstrated: RICA 20-49% stenosis, LICA 50-80% stenosis.  Patient has no history of TIA or stroke symptom.  The patient has never had amaurosis fugax or monocular blindness.  The patient has never had facial drooping or hemiplegia.  The patient has never had receptive or expressive aphasia.    The patient has ***interval neurologic sx.   The patient's PMH, PSH, SH, and FamHx were reviewed on 02/01/18 are unchanged from 07/25/17.  Current Outpatient Medications  Medication Sig Dispense Refill  . aspirin 81 MG chewable tablet Chew 1 tablet (81 mg total) by mouth 2 (two) times daily. Take for 4 weeks, then resume regular dose. 60 tablet 0  . cholecalciferol (VITAMIN D) 1000 UNITS tablet Take 1,000 Units by mouth daily.    Marland Kitchen. ezetimibe (ZETIA) 10 MG tablet Take 10 mg by mouth at bedtime.    . hydrOXYzine (VISTARIL) 50 MG capsule Take 50 mg by mouth at bedtime.  0  . levothyroxine (SYNTHROID, LEVOTHROID) 300 MCG tablet Take 300 mcg by mouth daily before breakfast.    . metoprolol tartrate (LOPRESSOR) 25 MG tablet Take 1 tablet (25 mg total) by mouth 2 (two) times daily. (Patient taking differently: Take 12.5 mg by mouth daily. ) 60 tablet 1  . Omega-3 Fatty Acids (FISH OIL CONCENTRATE) 300 MG CAPS Take 300 mg by mouth daily.    . vitamin E 400 UNIT capsule Take 400 Units by mouth daily.      No current facility-administered medications for this visit.     On ROS today: ***, ***   Physical Examination  ***There were no vitals filed for this visit. ***There is no height or weight on file to calculate BMI.  General {LOC:19197::"Somulent","Alert"}, {Orientation:19197::"Confused","O x 3"}, {Weight:19197::"Obese","Cachectic","WD"}, {General state of health:19197::"Ill appearing","Elderly","NAD"}  Neck Supple,  mid-line trachea, {NeckIncision:19197::"Neck incision healed"," "}  Pulmonary {Chest wall:19197::"Asx chest movement","Sym exp"}, {Air movt:19197::"Decreased *** air movt","good B air movt"}, {BS:19197::"rales on ***","rhonchi on ***","wheezing on ***","CTA B"}  Cardiac {Rhythm:19197::"Irregularly, irregular rate and rhythm","RRR, Nl S1, S2"}, {Murmur:19197::"Murmur present: ***","no Murmurs"}, {Rubs:19197::"Rub present: ***","No rubs"}, {Gallop:19197::"Gallop present: ***","No S3,S4"}  Vascular Vessel Right Left  Radial {Palpable:19197::"Not palpable","Faintly palpable","Palpable"} {Palpable:19197::"Not palpable","Faintly palpable","Palpable"}  Brachial {Palpable:19197::"Not palpable","Faintly palpable","Palpable"} {Palpable:19197::"Not palpable","Faintly palpable","Palpable"}  Carotid Palpable, {Bruit:19197::"Bruit present","No Bruit"} Palpable, {Bruit:19197::"Bruit present","No Bruit"}  Aorta Not palpable N/A  Femoral {Palpable:19197::"Not palpable","Faintly palpable","Palpable"} {Palpable:19197::"Not palpable","Faintly palpable","Palpable"}  Popliteal {Palpable:19197::"Prominently palpable","Not palpable"} {Palpable:19197::"Prominently palpable","Not palpable"}  PT {Palpable:19197::"Not palpable","Faintly palpable","Palpable"} {Palpable:19197::"Not palpable","Faintly palpable","Palpable"}  DP {Palpable:19197::"Not palpable","Faintly palpable","Palpable"} {Palpable:19197::"Not palpable","Faintly palpable","Palpable"}    Gastro- intestinal soft, {Distension:19197::"distended","non-distended"}, {TTP:19197::"TTP in *** quadrant","appropriate tenderness to palpation","non-tender to palpation"}, {Guarding:19197::"Guarding and rebound in *** quadrant","No guarding or rebound"}, {HSM:19197::"HSM present","no HSM"}, {Masses:19197::"Mass present: ***","no masses"}, {Flank:19197::"CVAT on ***","Flank bruit present on ***","no CVAT B"}, {AAA:19197::"Palpable prominent aortic pulse","No palpable prominent  aortic pulse"}, {Surgical incision:19197::"Surg incisions: ***","Surgical incisions well healed"," "}  Musculo- skeletal M/S 5/5 throughout {MS:19197::"except ***"," "}, Extremities without ischemic changes {MS:19197::"except ***"," "}  Neurologic Cranial nerves 2-12 intact{CN:19197::" except ***"," "}, Pain and light touch intact in extremities{CN:19197::" except for decreased sensation in ***"," "}, Motor exam as listed above    Non-Invasive Vascular Imaging   B Carotid Duplex (***):   R ICA stenosis:  {Stenosis:19197::"Occluded","80-99%","60-79%","40-59%","1-39%"}  R VA: *** patent and antegrade  L ICA stenosis:  {Stenosis:19197::"Occluded","80-99%","60-79%","40-59%","1-39%"}  L VA: *** patent and antegrade   Medical  Decision Making   LADD CEN is a 67 y.o. male who presents with: B asx ICA stenosis <80%.   Based on the patient's vascular studies and examination, I have offered the patient: ***.  I discussed in depth with the patient the nature of atherosclerosis, and emphasized the importance of maximal medical management including strict control of blood pressure, blood glucose, and lipid levels, antiplatelet agents, obtaining regular exercise, and cessation of smoking.    The patient is aware that without maximal medical management the underlying atherosclerotic disease process will progress, limiting the benefit of any interventions.  The patient is currently not on on statin as not medically indicated.   The patient is currently on an anti-platelet: ASA.  Thank you for allowing [place] to participate in this patient's care.   Leonides Sake, MD, FACS [facility] Office: [phone] Pager: [phone]

## 2018-02-01 ENCOUNTER — Encounter (HOSPITAL_COMMUNITY): Payer: 59

## 2018-02-01 ENCOUNTER — Ambulatory Visit: Payer: 59 | Admitting: Vascular Surgery

## 2018-03-29 ENCOUNTER — Ambulatory Visit: Payer: 59 | Admitting: Vascular Surgery

## 2018-03-29 ENCOUNTER — Ambulatory Visit (HOSPITAL_COMMUNITY)
Admission: RE | Admit: 2018-03-29 | Discharge: 2018-03-29 | Disposition: A | Discharge status: Home / Self Care | Payer: 59 | Source: Ambulatory Visit | Attending: Vascular Surgery | Admitting: Vascular Surgery

## 2018-03-29 ENCOUNTER — Ambulatory Visit (INDEPENDENT_AMBULATORY_CARE_PROVIDER_SITE_OTHER)
Admission: RE | Admit: 2018-03-29 | Discharge: 2018-03-29 | Disposition: A | Discharge status: Home / Self Care | Payer: 59 | Source: Ambulatory Visit | Attending: Vascular Surgery | Admitting: Vascular Surgery

## 2018-03-29 DIAGNOSIS — I10 Essential (primary) hypertension: Secondary | ICD-10-CM | POA: Insufficient documentation

## 2018-03-29 DIAGNOSIS — Z87891 Personal history of nicotine dependence: Secondary | ICD-10-CM | POA: Diagnosis not present

## 2018-03-29 DIAGNOSIS — R0989 Other specified symptoms and signs involving the circulatory and respiratory systems: Secondary | ICD-10-CM | POA: Insufficient documentation

## 2018-03-29 DIAGNOSIS — E785 Hyperlipidemia, unspecified: Secondary | ICD-10-CM | POA: Diagnosis not present

## 2018-03-29 DIAGNOSIS — I6523 Occlusion and stenosis of bilateral carotid arteries: Secondary | ICD-10-CM | POA: Insufficient documentation

## 2018-05-08 NOTE — Progress Notes (Signed)
Established Carotid Patient   History of Present Illness   William [name] is a 67 y.o. (24-Feb-1951) male who presents with chief complaint: none.  Previous carotid studies demonstrated: RICA 20-49% stenosis, LICA 50-80% stenosis.  The outside study looked faulty, so I recommended repeat in 3 months.   Patient has no history of TIA or stroke symptom.  The patient has never had amaurosis fugax or monocular blindness.  The patient has never had facial drooping or hemiplegia.  The patient has never had receptive or expressive aphasia.  BLE ABI also ordered for today  The patient's PMH, PSH, SH, and FamHx were reviewed on 05/09/18 are unchanged from 07/25/17.  Current Outpatient Medications  Medication Sig Dispense Refill  . aspirin 81 MG chewable tablet Chew 1 tablet (81 mg total) by mouth 2 (two) times daily. Take for 4 weeks, then resume regular dose. 60 tablet 0  . cholecalciferol (VITAMIN D) 1000 UNITS tablet Take 1,000 Units by mouth daily.    Marland Kitchen ezetimibe (ZETIA) 10 MG tablet Take 10 mg by mouth at bedtime.    . hydrOXYzine (VISTARIL) 50 MG capsule Take 50 mg by mouth at bedtime.  0  . levothyroxine (SYNTHROID, LEVOTHROID) 300 MCG tablet Take 300 mcg by mouth daily before breakfast.    . metoprolol tartrate (LOPRESSOR) 25 MG tablet Take 1 tablet (25 mg total) by mouth 2 (two) times daily. (Patient taking differently: Take 12.5 mg by mouth daily. ) 60 tablet 1  . Omega-3 Fatty Acids (FISH OIL CONCENTRATE) 300 MG CAPS Take 300 mg by mouth daily.    . vitamin E 400 UNIT capsule Take 400 Units by mouth daily.      No current facility-administered medications for this visit.     On ROS today: no TIA or CVA sx, no intermittent claudication    Physical Examination   Vitals:   05/10/18 0842 05/10/18 0843  BP: 107/72 113/69  Pulse: 84   Resp: 20   SpO2: 95%   Weight: 279 lb (126.6 kg)   Height: 5' 8.7" (1.745 m)    Body mass index is 41.56 kg/m.  General Alert, O x 3, Obese,  NAD  Neck Supple, mid-line trachea,    Pulmonary Sym exp, good B air movt, CTA B  Cardiac RRR, Nl S1, S2, no Murmurs, No rubs, No S3,S4  Vascular Vessel Right Left  Radial Palpable Palpable  Brachial Palpable Palpable  Carotid Palpable, No Bruit Palpable, No Bruit  Aorta Not palpable N/A  Femoral Palpable Palpable  Popliteal Not palpable Not palpable  PT Palpable Palpable  DP Palpable Palpable    Gastro- intestinal soft, non-distended, non-tender to palpation, No guarding or rebound, no HSM, no masses, no CVAT B, No palpable prominent aortic pulse,    Musculo- skeletal M/S 5/5 throughout  , Extremities without ischemic changes    Neurologic Cranial nerves grossly intact , Pain and light touch intact in extremities , Motor exam as listed above    Non-Invasive Vascular Imaging   B Carotid Duplex (03/29/18):   R ICA stenosis:  1-39%  R VA: patent and antegrade  L ICA stenosis:  1-39%  L VA: patent and antegrade  ABI (03/29/18)  R:   ABI: 1.29,   PT: tri  DP: tri  TBI:  0.96  L:   ABI: 1.23,   PT: tri  DP: tri  TBI: 0.82   Medical Decision Making   ARMANDO BUKHARI is a 67 y.o. male who presents  with: B asx ICA stenosis <80%.   Based on the patient's vascular studies and examination, I have offered the patient: annual B carotid duplex.  I discussed in depth with the patient the nature of atherosclerosis, and emphasized the importance of maximal medical management including strict control of blood pressure, blood glucose, and lipid levels, antiplatelet agents, obtaining regular exercise, and cessation of smoking.    The patient is aware that without maximal medical management the underlying atherosclerotic disease process will progress, limiting the benefit of any interventions.  The patient is currently not on on statin as not medically indicated.   The patient is currently on an anti-platelet: ASA.  Thank you for allowing us to participate in this  patient's care.   Leonides SakeBrian Ashtin Melichar, MD, FACS [facility] [place] Office: [phone] Pager: [phone]

## 2018-05-10 ENCOUNTER — Encounter: Payer: Self-pay | Admitting: Vascular Surgery

## 2018-05-10 ENCOUNTER — Other Ambulatory Visit: Payer: Self-pay

## 2018-05-10 ENCOUNTER — Ambulatory Visit: Payer: 59 | Admitting: Vascular Surgery

## 2018-05-10 VITALS — BP 113/69 | HR 84 | Resp 20 | Ht 68.7 in | Wt 279.0 lb

## 2018-05-10 DIAGNOSIS — I6523 Occlusion and stenosis of bilateral carotid arteries: Secondary | ICD-10-CM | POA: Diagnosis not present

## 2021-06-23 ENCOUNTER — Other Ambulatory Visit: Payer: Self-pay | Admitting: Ophthalmology

## 2022-11-25 ENCOUNTER — Telehealth: Payer: Medicare HMO | Admitting: Physician Assistant

## 2022-11-25 DIAGNOSIS — J019 Acute sinusitis, unspecified: Secondary | ICD-10-CM | POA: Diagnosis not present

## 2022-11-25 DIAGNOSIS — B9689 Other specified bacterial agents as the cause of diseases classified elsewhere: Secondary | ICD-10-CM | POA: Diagnosis not present

## 2022-11-25 MED ORDER — DOXYCYCLINE HYCLATE 100 MG PO TABS: 100.0000 mg | ORAL_TABLET | Freq: Two times a day (BID) | ORAL | 0 refills | Status: AC

## 2022-11-25 NOTE — Patient Instructions (Signed)
William [name], thank you for joining Leeanne Rio, PA-C for today's virtual visit.  While this provider is not your primary care provider (PCP), if your PCP is located in our provider database this encounter information will be shared with them immediately following your visit.   [facility] account gives you access to today's visit and all your visits, tests, and labs performed at [facility] " click here if you don't have a [facility] account or go to [email or web address]  Consent: (Patient) William [name] provided verbal consent for this virtual visit at the beginning of the encounter.  Current Medications:  Current Outpatient Medications:    aspirin 81 MG chewable tablet, Chew 1 tablet (81 mg total) by mouth 2 (two) times daily. Take for 4 weeks, then resume regular dose., Disp: 60 tablet, Rfl: 0   cholecalciferol (VITAMIN D) 1000 UNITS tablet, Take 1,000 Units by mouth daily., Disp: , Rfl:    ezetimibe (ZETIA) 10 MG tablet, Take 10 mg by mouth at bedtime., Disp: , Rfl:    hydrOXYzine (VISTARIL) 50 MG capsule, Take 50 mg by mouth at bedtime., Disp: , Rfl: 0   levothyroxine (SYNTHROID, LEVOTHROID) 300 MCG tablet, Take 300 mcg by mouth daily before breakfast., Disp: , Rfl:    metoprolol tartrate (LOPRESSOR) 25 MG tablet, Take 1 tablet (25 mg total) by mouth 2 (two) times daily. (Patient taking differently: Take 12.5 mg by mouth daily. ), Disp: 60 tablet, Rfl: 1   Omega-3 Fatty Acids (FISH OIL CONCENTRATE) 300 MG CAPS, Take 300 mg by mouth daily., Disp: , Rfl:    vitamin E 400 UNIT capsule, Take 400 Units by mouth daily. , Disp: , Rfl:    Medications ordered in this encounter:  No orders of the defined types were placed in this encounter.    *If you need refills on other medications prior to your next appointment, please contact your pharmacy*  Follow-Up: Call back or seek an in-person evaluation if the symptoms worsen or if the condition  fails to improve as anticipated.  [facility] [phone]  Other Instructions Please take antibiotic as directed.  Increase fluid intake.  Use Saline nasal spray.  Take a daily multivitamin. Resume use of your Navage irrigator. Ok to continue your OTC medications.  Place a humidifier in the bedroom.  Please call or return clinic if symptoms are not improving.  Sinusitis Sinusitis is redness, soreness, and swelling (inflammation) of the paranasal sinuses. Paranasal sinuses are air pockets within the bones of your face (beneath the eyes, the middle of the forehead, or above the eyes). In healthy paranasal sinuses, mucus is able to drain out, and air is able to circulate through them by way of your nose. However, when your paranasal sinuses are inflamed, mucus and air can become trapped. This can allow bacteria and other germs to grow and cause infection. Sinusitis can develop quickly and last only a short time (acute) or continue over a long period (chronic). Sinusitis that lasts for more than 12 weeks is considered chronic.  CAUSES  Causes of sinusitis include: Allergies. Structural abnormalities, such as displacement of the cartilage that separates your nostrils (deviated septum), which can decrease the air flow through your nose and sinuses and affect sinus drainage. Functional abnormalities, such as when the small hairs (cilia) that line your sinuses and help remove mucus do not work properly or are not present. SYMPTOMS  Symptoms of acute and chronic sinusitis are the same.  The primary symptoms are pain and pressure around the affected sinuses. Other symptoms include: Upper toothache. Earache. Headache. Bad breath. Decreased sense of smell and taste. A cough, which worsens when you are lying flat. Fatigue. Fever. Thick drainage from your nose, which often is green and may contain pus (purulent). Swelling and warmth over the affected sinuses. DIAGNOSIS  Your caregiver  will perform a physical exam. During the exam, your caregiver may: Look in your nose for signs of abnormal growths in your nostrils (nasal polyps). Tap over the affected sinus to check for signs of infection. View the inside of your sinuses (endoscopy) with a special imaging device with a light attached (endoscope), which is inserted into your sinuses. If your caregiver suspects that you have chronic sinusitis, one or more of the following tests may be recommended: Allergy tests. Nasal culture A sample of mucus is taken from your nose and sent to a lab and screened for bacteria. Nasal cytology A sample of mucus is taken from your nose and examined by your caregiver to determine if your sinusitis is related to an allergy. TREATMENT  Most cases of acute sinusitis are related to a viral infection and will resolve on their own within 10 days. Sometimes medicines are prescribed to help relieve symptoms (pain medicine, decongestants, nasal steroid sprays, or saline sprays).  However, for sinusitis related to a bacterial infection, your caregiver will prescribe antibiotic medicines. These are medicines that will help kill the bacteria causing the infection.  Rarely, sinusitis is caused by a fungal infection. In theses cases, your caregiver will prescribe antifungal medicine. For some cases of chronic sinusitis, surgery is needed. Generally, these are cases in which sinusitis recurs more than 3 times per year, despite other treatments. HOME CARE INSTRUCTIONS  Drink plenty of water. Water helps thin the mucus so your sinuses can drain more easily. Use a humidifier. Inhale steam 3 to 4 times a day (for example, sit in the bathroom with the shower running). Apply a warm, moist washcloth to your face 3 to 4 times a day, or as directed by your caregiver. Use saline nasal sprays to help moisten and clean your sinuses. Take over-the-counter or prescription medicines for pain, discomfort, or fever only as  directed by your caregiver. SEEK IMMEDIATE MEDICAL CARE IF: You have increasing pain or severe headaches. You have nausea, vomiting, or drowsiness. You have swelling around your face. You have vision problems. You have a stiff neck. You have difficulty breathing. MAKE SURE YOU:  Understand these instructions. Will watch your condition. Will get help right away if you are not doing well or get worse. Document Released: 10/04/2005 Document Revised: 12/27/2011 Document Reviewed: 10/19/2011 [facility] Patient Information 2014 [place], [place].    If you have been instructed to have an in-person evaluation today at a local Urgent Care facility, please use the link below. It will take you to a list of all of our available [facility], including address, phone number and hours of operation. Please do not delay care.  [facility]  If you or a family member do not have a primary care provider, use the link below to schedule a visit and establish care. When you choose a [facility] primary care physician or advanced practice provider, you gain a long-term partner in health. Find a Primary Care Provider  Learn more about [facility]'s in-office and virtual care options: [facility]

## 2022-11-25 NOTE — Progress Notes (Signed)
Virtual Visit Consent   William [name], you are scheduled for a virtual visit with a [facility] provider today. Just as with appointments in the office, your consent must be obtained to participate. Your consent will be active for this visit and any virtual visit you may have with one of our providers in the next 365 days. If you have a MyChart account, a copy of this consent can be sent to you electronically.  As this is a virtual visit, video technology does not allow for your provider to perform a traditional examination. This may limit your provider's ability to fully assess your condition. If your provider identifies any concerns that need to be evaluated in person or the need to arrange testing (such as labs, EKG, etc.), we will make arrangements to do so. Although advances in technology are sophisticated, we cannot ensure that it will always work on either your end or our end. If the connection with a video visit is poor, the visit may have to be switched to a telephone visit. With either a video or telephone visit, we are not always able to ensure that we have a secure connection.  By engaging in this virtual visit, you consent to the provision of healthcare and authorize for your insurance to be billed (if applicable) for the services provided during this visit. Depending on your insurance coverage, you may receive a charge related to this service.  I need to obtain your verbal consent now. Are you willing to proceed with your visit today? William [name] has provided verbal consent on 11/25/2022 for a virtual visit (video or telephone). Leeanne Rio, [place]  Date: 11/25/2022 11:54 AM  Virtual Visit via Video Note   I, Leeanne Rio, connected with  William [name]  ([id number], 1951/05/18) on 11/25/22 at 11:45 AM EST by a video-enabled telemedicine application and verified that I am speaking with the correct person using two identifiers.  Location: Patient: Virtual Visit  Location Patient: Home Provider: Virtual Visit Location Provider: Home Office   I discussed the limitations of evaluation and management by telemedicine and the availability of in person appointments. The patient expressed understanding and agreed to proceed.    History of Present Illness: William [name] is a 72 y.o. who identifies as a male who was assigned male at birth, and is being seen today for concern for sinusitis. Endorses symptoms starting last week, and initially improved but then worsened again over the weekend. Notes symptoms including nasal and head congestion, post-nasal drip, sinus pressure/pain and cough. Notes now with very thick yellow nasal discharge.  OTC -- Alka Seltzer Cold, Tylenol  HPI: HPI  Problems:  Patient Active Problem List   Diagnosis Date Noted   Carotid artery disease ([place]) 07/25/2017   Total knee replacement status, right 12/28/2016   S/P total knee arthroplasty, right 12/27/2016   Morbid obesity ([place]) 11/24/2016   S/P left TKA 11/23/2016   H/O cardiovascular stress test    S/P colostomy takedown 06/19/2015   S/P colostomy ([place]) 11/21/2014   Hypothyroidism 11/20/2014   Essential hypertension 11/20/2014   Acute on chronic respiratory failure ([place]) 11/19/2014   Bacteroides fragilis infection 11/12/2014   Normocytic anemia 11/12/2014   Solitary pulmonary nodule on lung CT 10/28/2014   Obstructive sleep apnea 10/28/2014   Diverticulitis of colon with perforation s/p colectomy/colostomy 10/28/2014 10/27/2014    Allergies:  Allergies  Allergen Reactions   Aloprim [Allopurinol] Other (See Comments)    "MUSCLE SEIZED UP"  Simvastatin Other (See Comments)    Muscle aches.   Penicillins Rash    Has patient had a PCN reaction causing immediate rash, facial/tongue/throat swelling, SOB or lightheadedness with hypotension: No Has patient had a PCN reaction causing severe rash involving mucus membranes or skin necrosis: No Has patient had a PCN reaction  that required hospitalization No Has patient had a PCN reaction occurring within the last 10 years: Yes If all of the above answers are "NO", then may proceed with Cephalosporin use.    Medications:  Current Outpatient Medications:    aspirin 81 MG chewable tablet, Chew 1 tablet (81 mg total) by mouth 2 (two) times daily. Take for 4 weeks, then resume regular dose., Disp: 60 tablet, Rfl: 0   cholecalciferol (VITAMIN D) 1000 UNITS tablet, Take 1,000 Units by mouth daily., Disp: , Rfl:    ezetimibe (ZETIA) 10 MG tablet, Take 10 mg by mouth at bedtime., Disp: , Rfl:    hydrOXYzine (VISTARIL) 50 MG capsule, Take 50 mg by mouth at bedtime., Disp: , Rfl: 0   levothyroxine (SYNTHROID, LEVOTHROID) 300 MCG tablet, Take 300 mcg by mouth daily before breakfast., Disp: , Rfl:    metoprolol tartrate (LOPRESSOR) 25 MG tablet, Take 1 tablet (25 mg total) by mouth 2 (two) times daily. (Patient taking differently: Take 12.5 mg by mouth daily. ), Disp: 60 tablet, Rfl: 1   Omega-3 Fatty Acids (FISH OIL CONCENTRATE) 300 MG CAPS, Take 300 mg by mouth daily., Disp: , Rfl:    vitamin E 400 UNIT capsule, Take 400 Units by mouth daily. , Disp: , Rfl:   Observations/Objective: Patient is well-developed, well-nourished in no acute distress.  Resting comfortably at home.  Head is normocephalic, atraumatic.  No labored breathing. Speech is clear and coherent with logical content.  Patient is alert and oriented at baseline.   Assessment and Plan: 1. Acute bacterial sinusitis  Rx Doxycycline.  Increase fluids.  Rest.  Saline nasal spray.  Probiotic.  Mucinex as directed.  Humidifier in bedroom. Ok to continue OTC medications. Resume Navage.  Call or return to clinic if symptoms are not improving.   Follow Up Instructions: I discussed the assessment and treatment plan with the patient. The patient was provided an opportunity to ask questions and all were answered. The patient agreed with the plan and demonstrated an  understanding of the instructions.  A copy of instructions were sent to the patient via MyChart unless otherwise noted below.   The patient was advised to call back or seek an in-person evaluation if the symptoms worsen or if the condition fails to improve as anticipated.  Time:  I spent 10 minutes with the patient via telehealth technology discussing the above problems/concerns.    Leeanne Rio, PA-C
